# Patient Record
Sex: Male | Born: 1955 | Race: White | Hispanic: No | Marital: Single | Smoking: Current every day smoker
Health system: Southern US, Community
[De-identification: ages and names within clinical notes are randomized; demographics above are authoritative.]

## PROBLEM LIST (undated history)

## (undated) DIAGNOSIS — M1990 Unspecified osteoarthritis, unspecified site: Secondary | ICD-10-CM

## (undated) DIAGNOSIS — I2510 Atherosclerotic heart disease of native coronary artery without angina pectoris: Secondary | ICD-10-CM

## (undated) DIAGNOSIS — Z7901 Long term (current) use of anticoagulants: Secondary | ICD-10-CM

## (undated) DIAGNOSIS — I5189 Other ill-defined heart diseases: Secondary | ICD-10-CM

## (undated) DIAGNOSIS — J449 Chronic obstructive pulmonary disease, unspecified: Secondary | ICD-10-CM

## (undated) DIAGNOSIS — D696 Thrombocytopenia, unspecified: Secondary | ICD-10-CM

## (undated) DIAGNOSIS — I209 Angina pectoris, unspecified: Secondary | ICD-10-CM

## (undated) DIAGNOSIS — I10 Essential (primary) hypertension: Secondary | ICD-10-CM

## (undated) DIAGNOSIS — F419 Anxiety disorder, unspecified: Secondary | ICD-10-CM

## (undated) DIAGNOSIS — I700 Atherosclerosis of aorta: Secondary | ICD-10-CM

## (undated) DIAGNOSIS — T8859XA Other complications of anesthesia, initial encounter: Secondary | ICD-10-CM

## (undated) DIAGNOSIS — R911 Solitary pulmonary nodule: Secondary | ICD-10-CM

## (undated) DIAGNOSIS — I6529 Occlusion and stenosis of unspecified carotid artery: Secondary | ICD-10-CM

## (undated) DIAGNOSIS — E785 Hyperlipidemia, unspecified: Secondary | ICD-10-CM

## (undated) DIAGNOSIS — K219 Gastro-esophageal reflux disease without esophagitis: Secondary | ICD-10-CM

## (undated) DIAGNOSIS — R0600 Dyspnea, unspecified: Secondary | ICD-10-CM

## (undated) DIAGNOSIS — I4891 Unspecified atrial fibrillation: Secondary | ICD-10-CM

## (undated) DIAGNOSIS — E079 Disorder of thyroid, unspecified: Secondary | ICD-10-CM

## (undated) DIAGNOSIS — E039 Hypothyroidism, unspecified: Secondary | ICD-10-CM

## (undated) HISTORY — PX: CARDIAC CATHETERIZATION: SHX172

## (undated) HISTORY — PX: CATARACT EXTRACTION: SUR2

## (undated) HISTORY — DX: Disorder of thyroid, unspecified: E07.9

## (undated) HISTORY — DX: Essential (primary) hypertension: I10

## (undated) HISTORY — PX: UPPER GI ENDOSCOPY: SHX6162

## (undated) HISTORY — PX: LEG SURGERY: SHX1003

## (undated) HISTORY — PX: WRIST SURGERY: SHX841

## (undated) HISTORY — PX: FRACTURE SURGERY: SHX138

## (undated) HISTORY — PX: COLONOSCOPY: SHX174

## (undated) HISTORY — PX: HERNIA REPAIR: SHX51

---

## 2008-07-09 ENCOUNTER — Inpatient Hospital Stay: Payer: Self-pay | Admitting: Orthopaedic Surgery

## 2008-07-09 ENCOUNTER — Other Ambulatory Visit: Payer: Self-pay

## 2008-07-09 IMAGING — CR DG FEMUR 2V*R*
1 series · 3 of 3 positions shown · non-contrast
Comparison: none

REASON FOR EXAM: fall, pain
COMMENTS:

PROCEDURE:     DXR - DXR FEMUR RIGHT  - [DATE]  [DATE]
RESULT:     There does not appear to be evidence of fracture, dislocation or
malalignment.

[Series 1: view not recorded · 0.17mm/px · 3 of 3 slices shown]
[im 1/3]
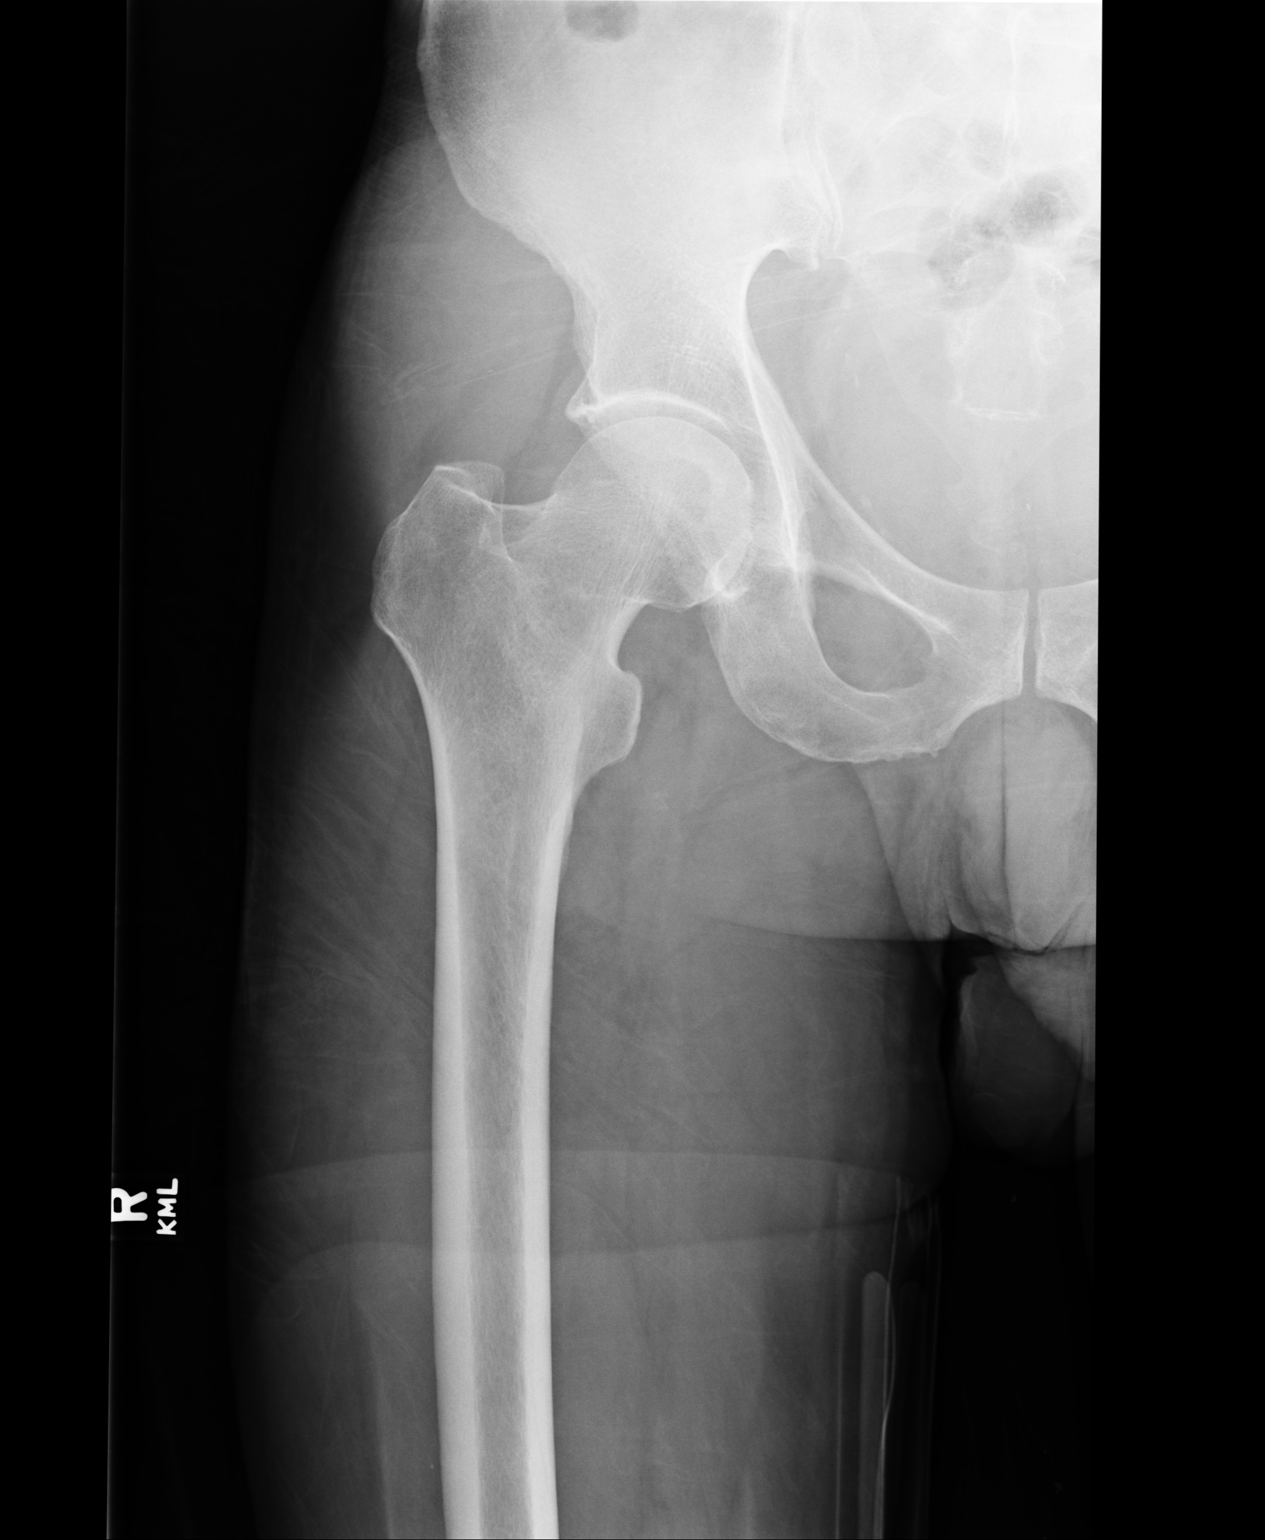
[im 2/3]
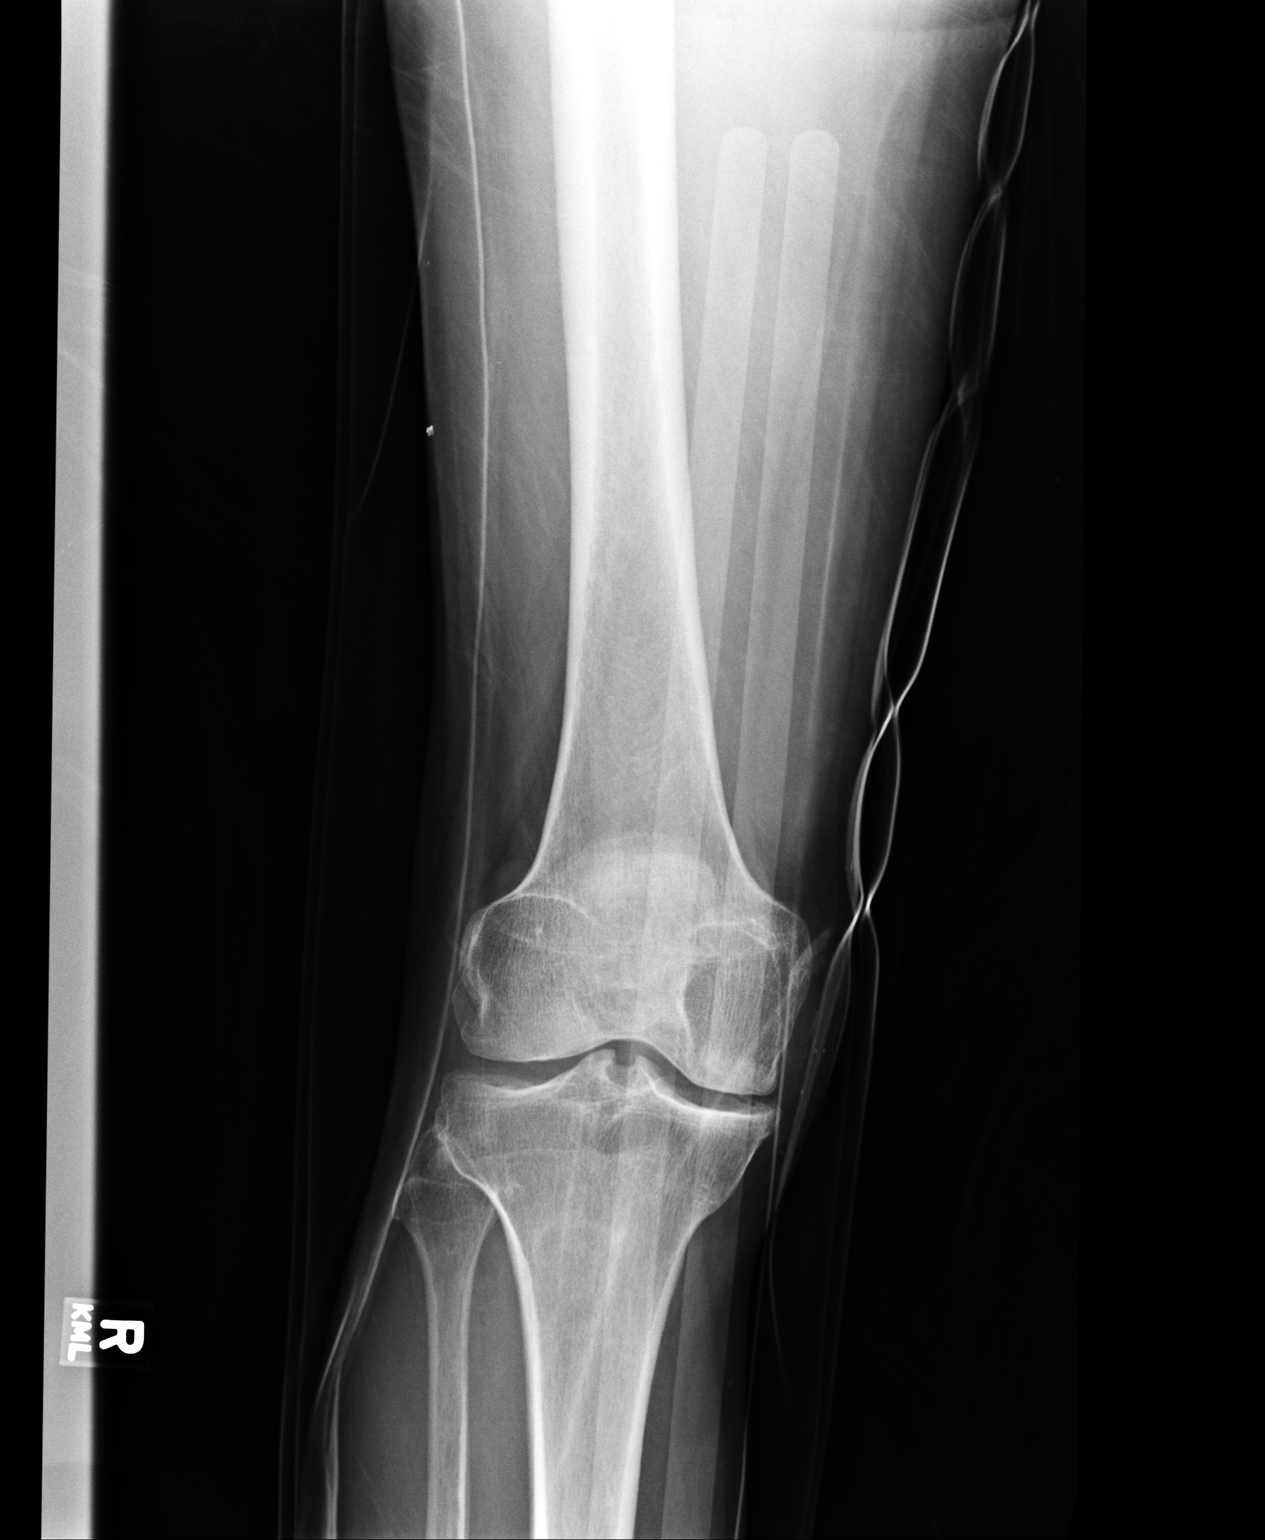
[im 3/3]
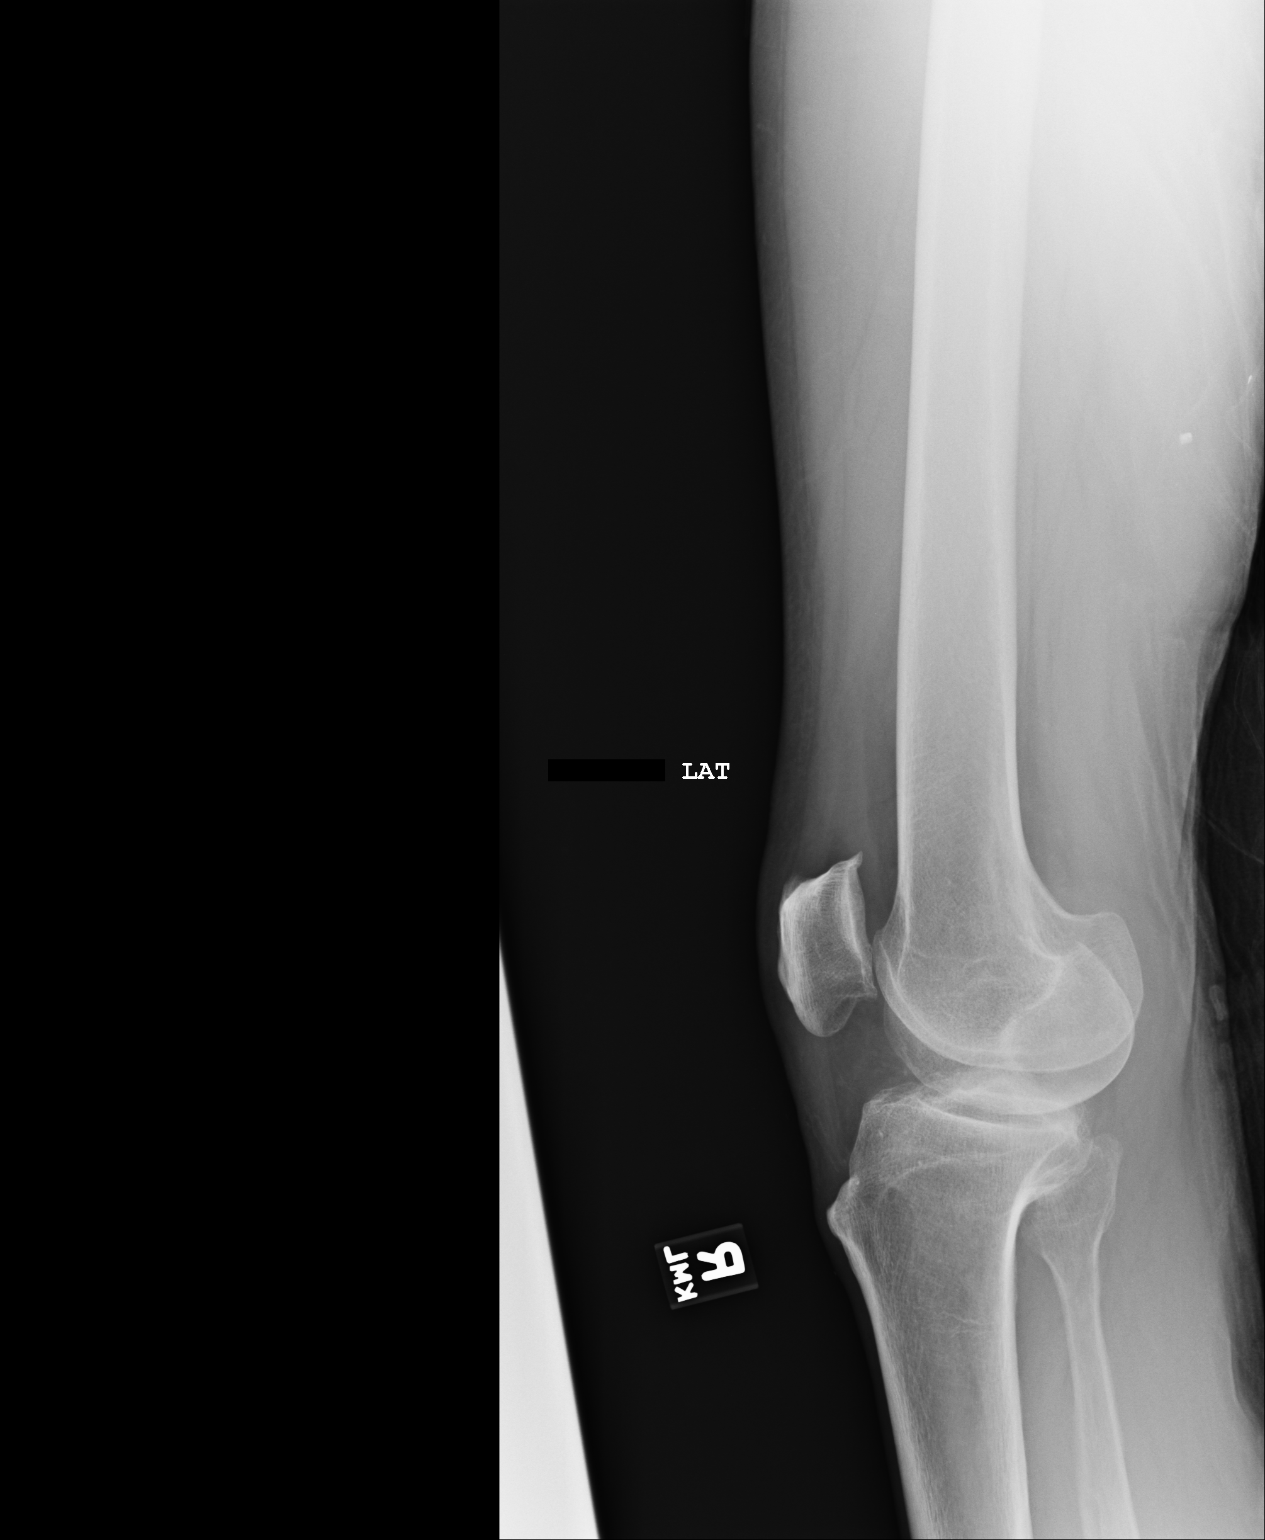

[3 of 3 positions shown; findings below may reference images not displayed]

IMPRESSION: Unremarkable RIGHT femur.

If there is persistent complaints of pain or persistent clinical concern, a
repeat evaluation in 7-10 days is recommended if clinically warranted.

## 2008-07-09 IMAGING — CR DG CHEST 1V
1 series · 2 of 2 positions shown · non-contrast
Comparison: none

REASON FOR EXAM: fall, distracting injury
COMMENTS:

[Series 1: view not recorded · 0.17mm/px · 2 of 2 slices shown]
[im 1/2]
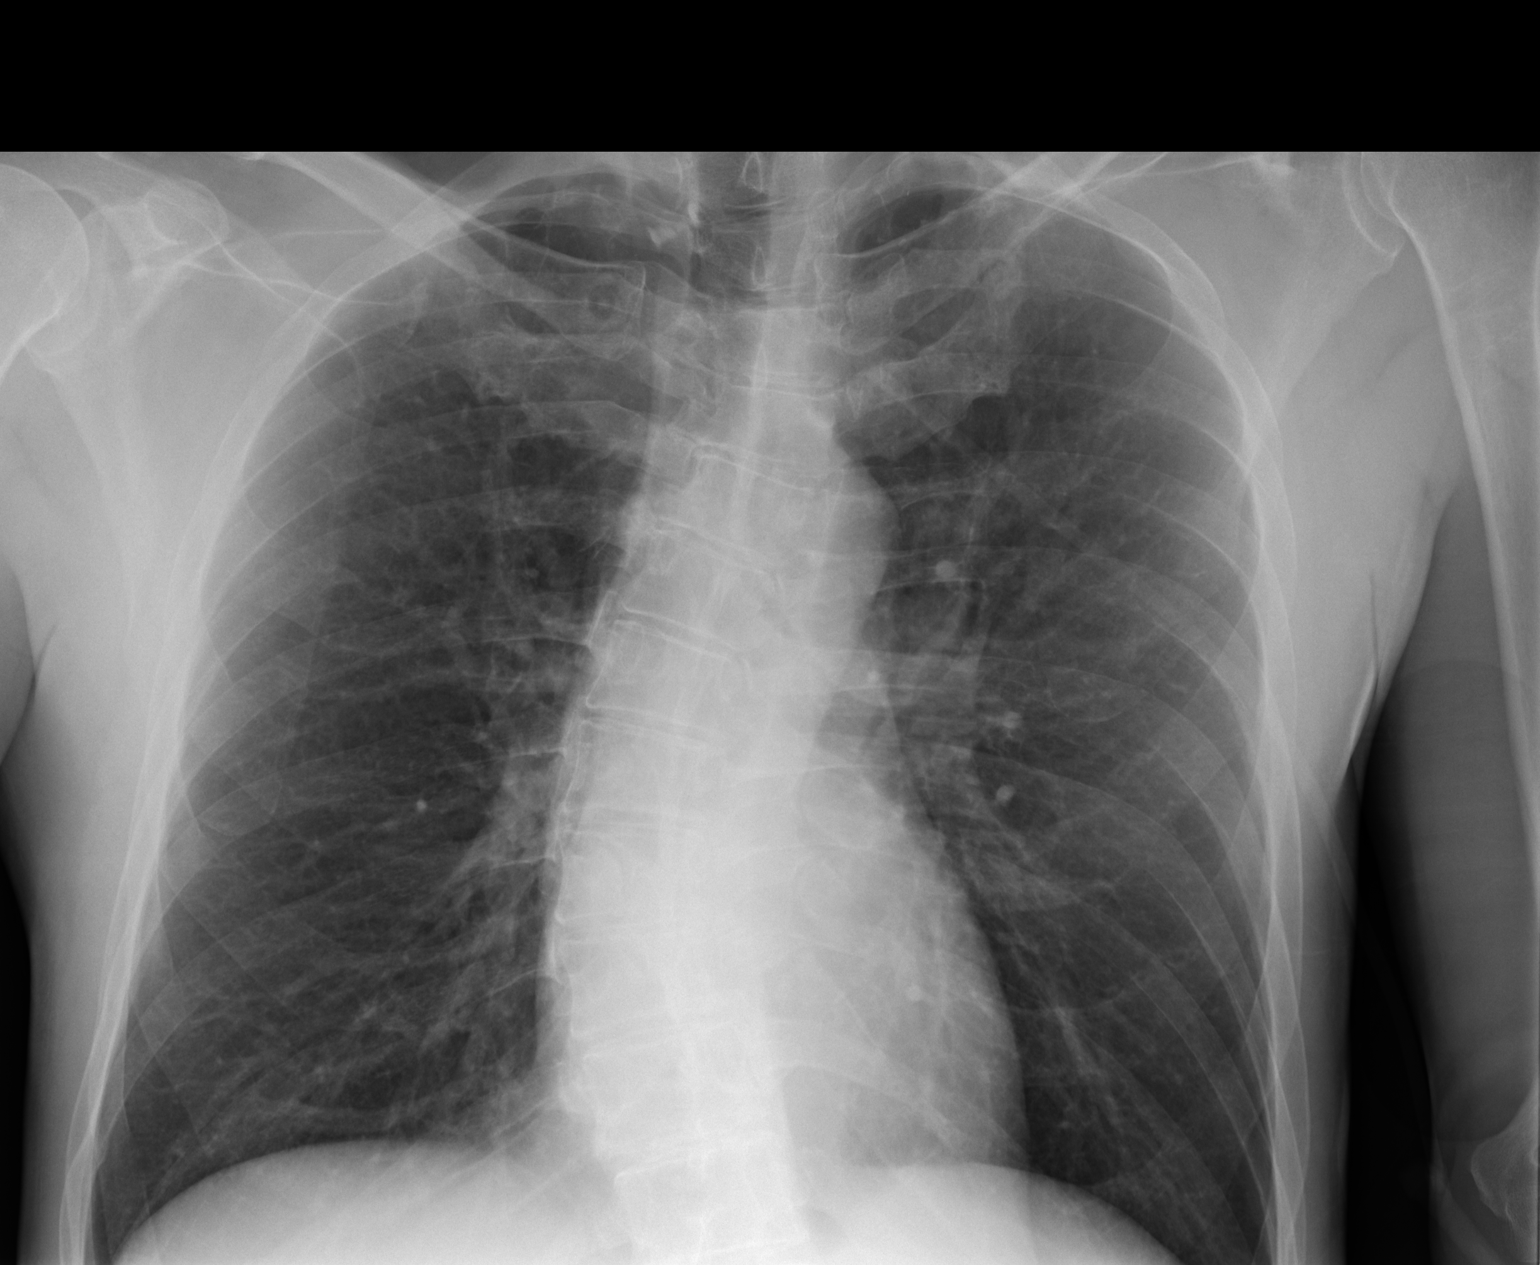
[im 2/2]
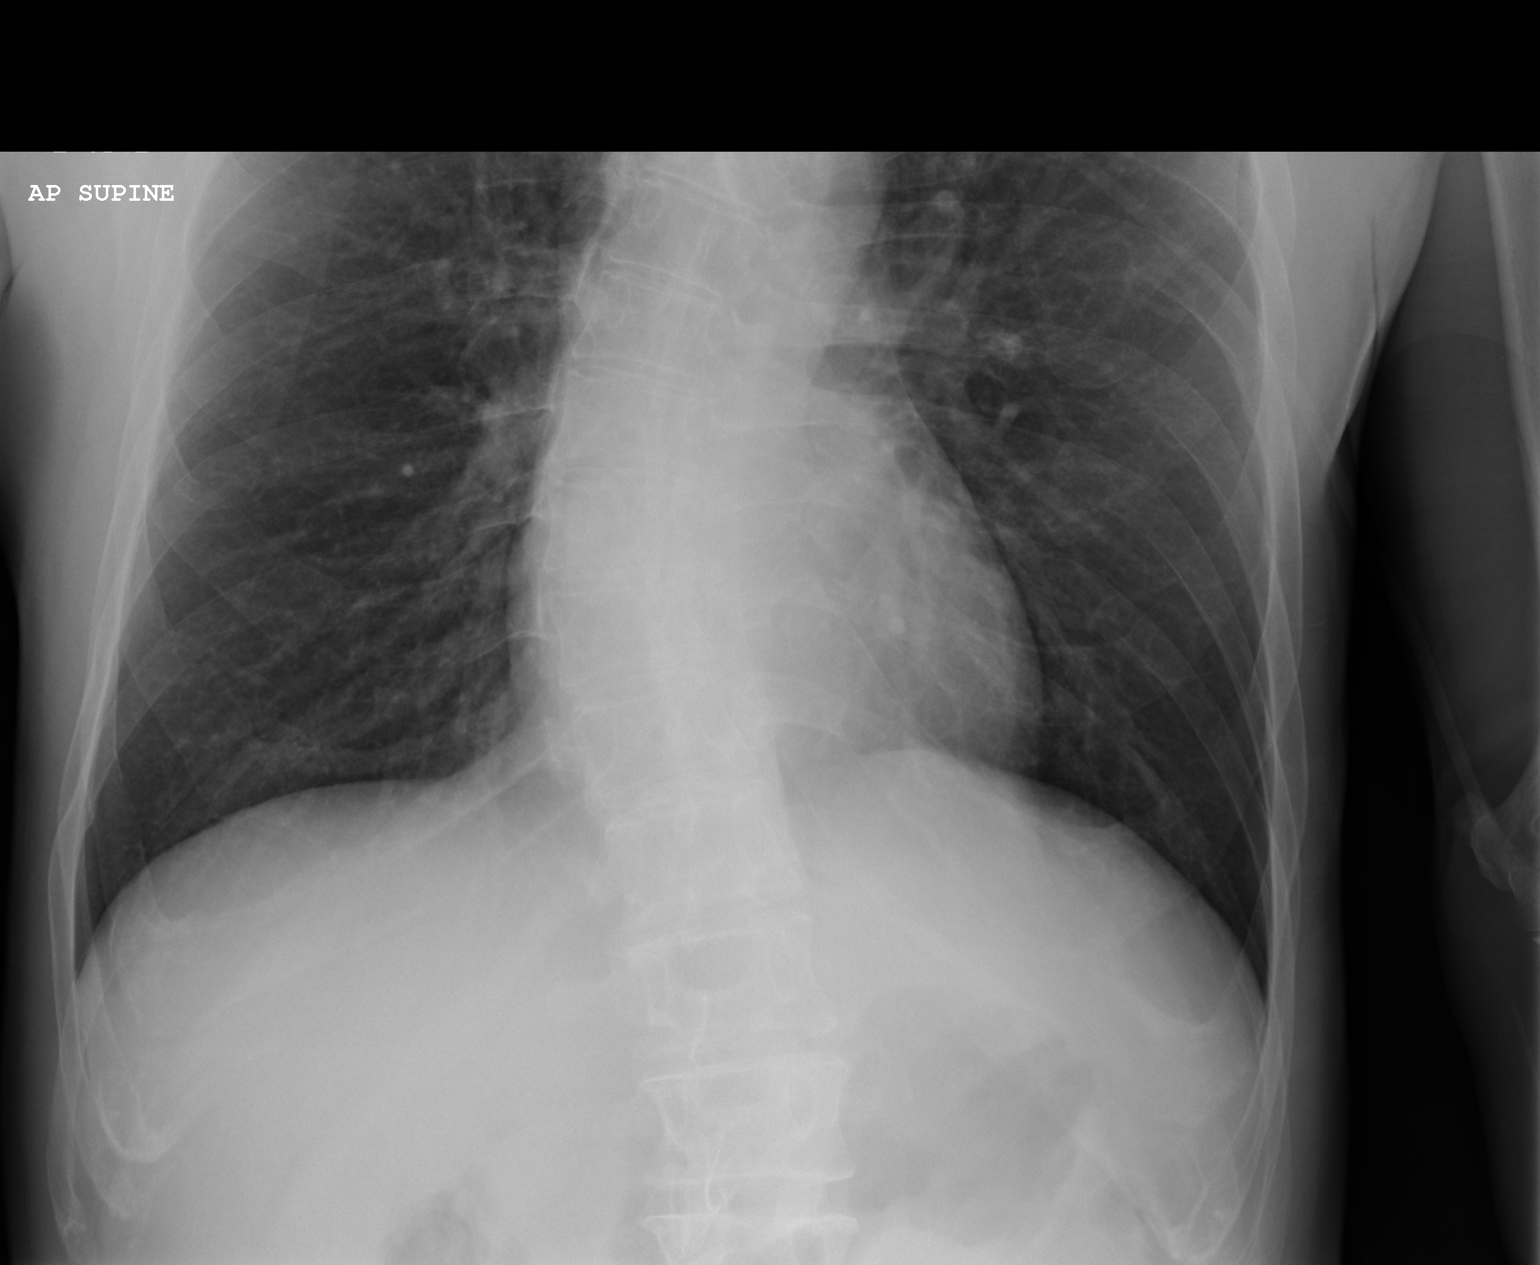

[2 of 2 positions shown; findings below may reference images not displayed]

PROCEDURE:     DXR - DXR CHEST 1 VIEWAP OR PA  - [DATE]  [DATE]

RESULT:     There is no evidence of focal regions of consolidation or focal
infiltrates.  There is mild thickening of the  interstitial markings.  The
cardiac silhouette is within normal limits.  The visualized bony skeleton
demonstrates dextroscoliosis of the thoracolumbar spine.
IMPRESSION: 1.     No evidence of focal regions of consolidation.  An interstitial
infiltrate such as bronchitis cannot be excluded if of clinical concern.
2.     Scoliosis of the thoracolumbar spine.

## 2008-07-09 IMAGING — CR RIGHT TIBIA AND FIBULA - 2 VIEW
1 series · 2 of 2 positions shown · non-contrast
Comparison: none

REASON FOR EXAM: fall, pain, deformity
COMMENTS:

[Series 1: view not recorded · 0.17mm/px · 2 of 2 slices shown]
[im 1/2]
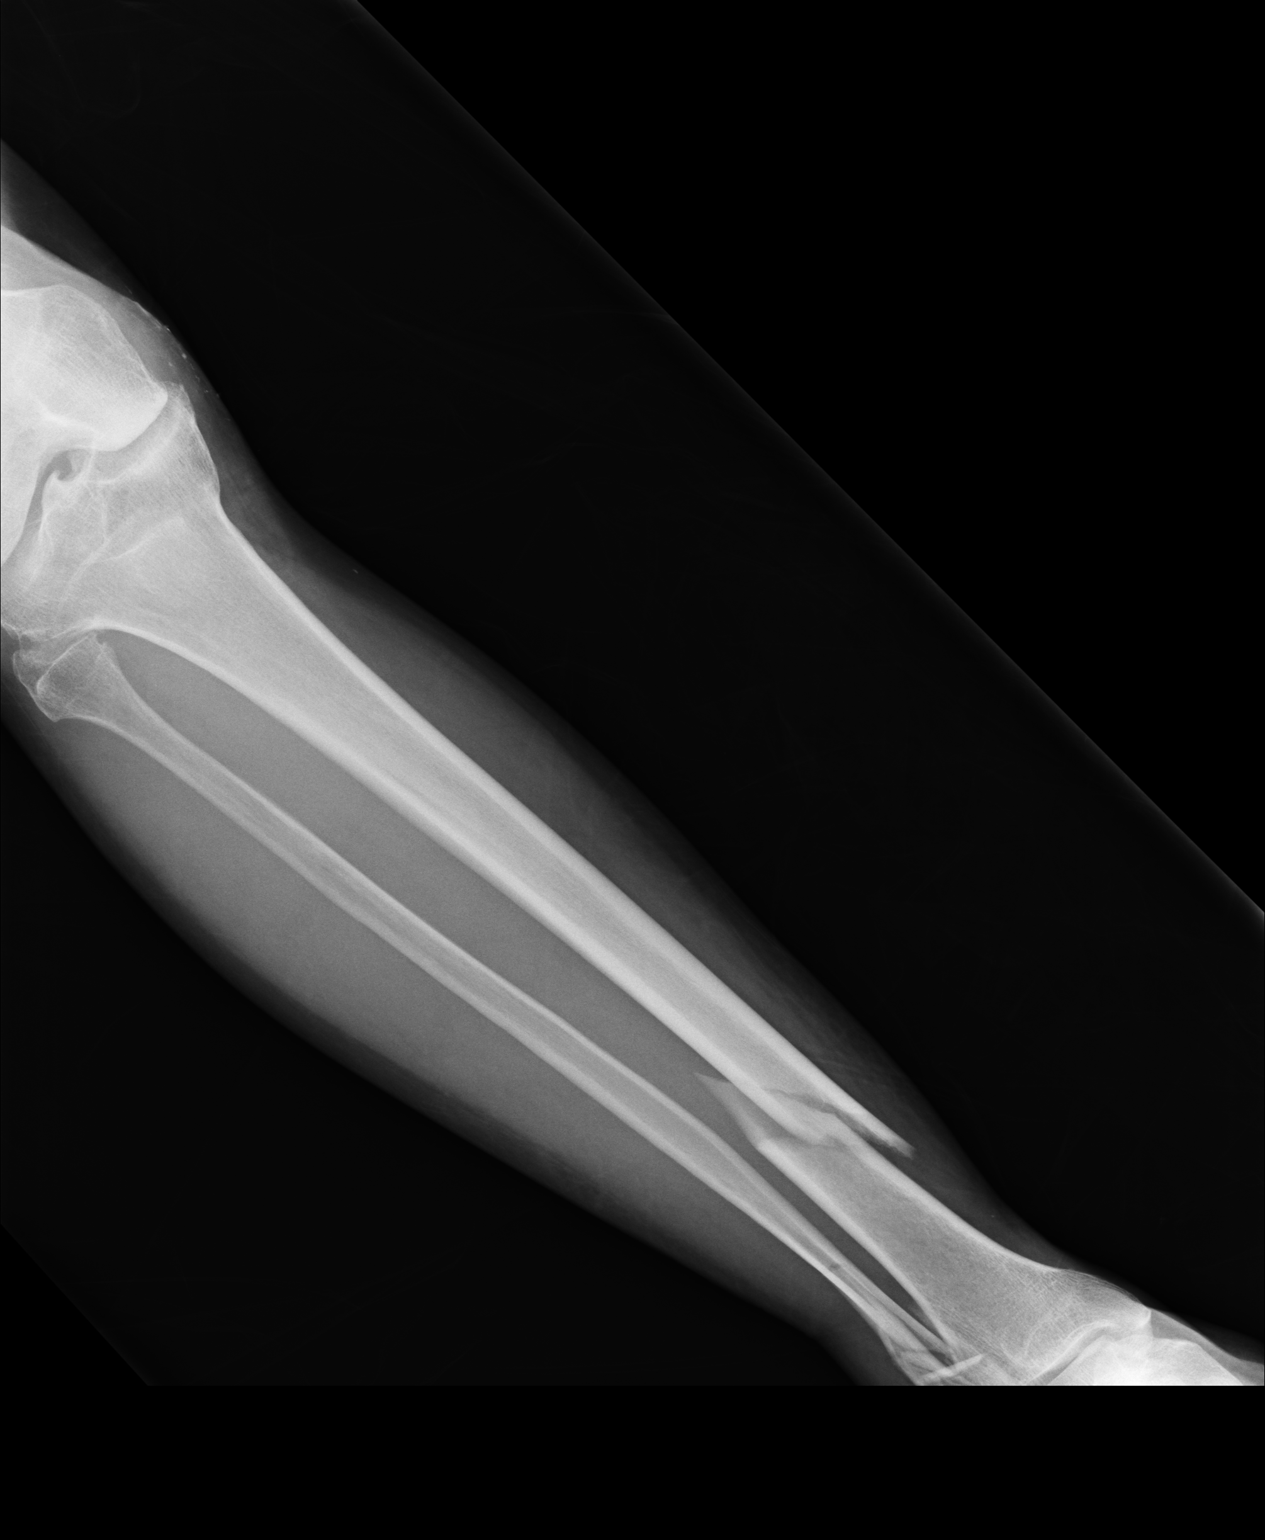
[im 2/2]
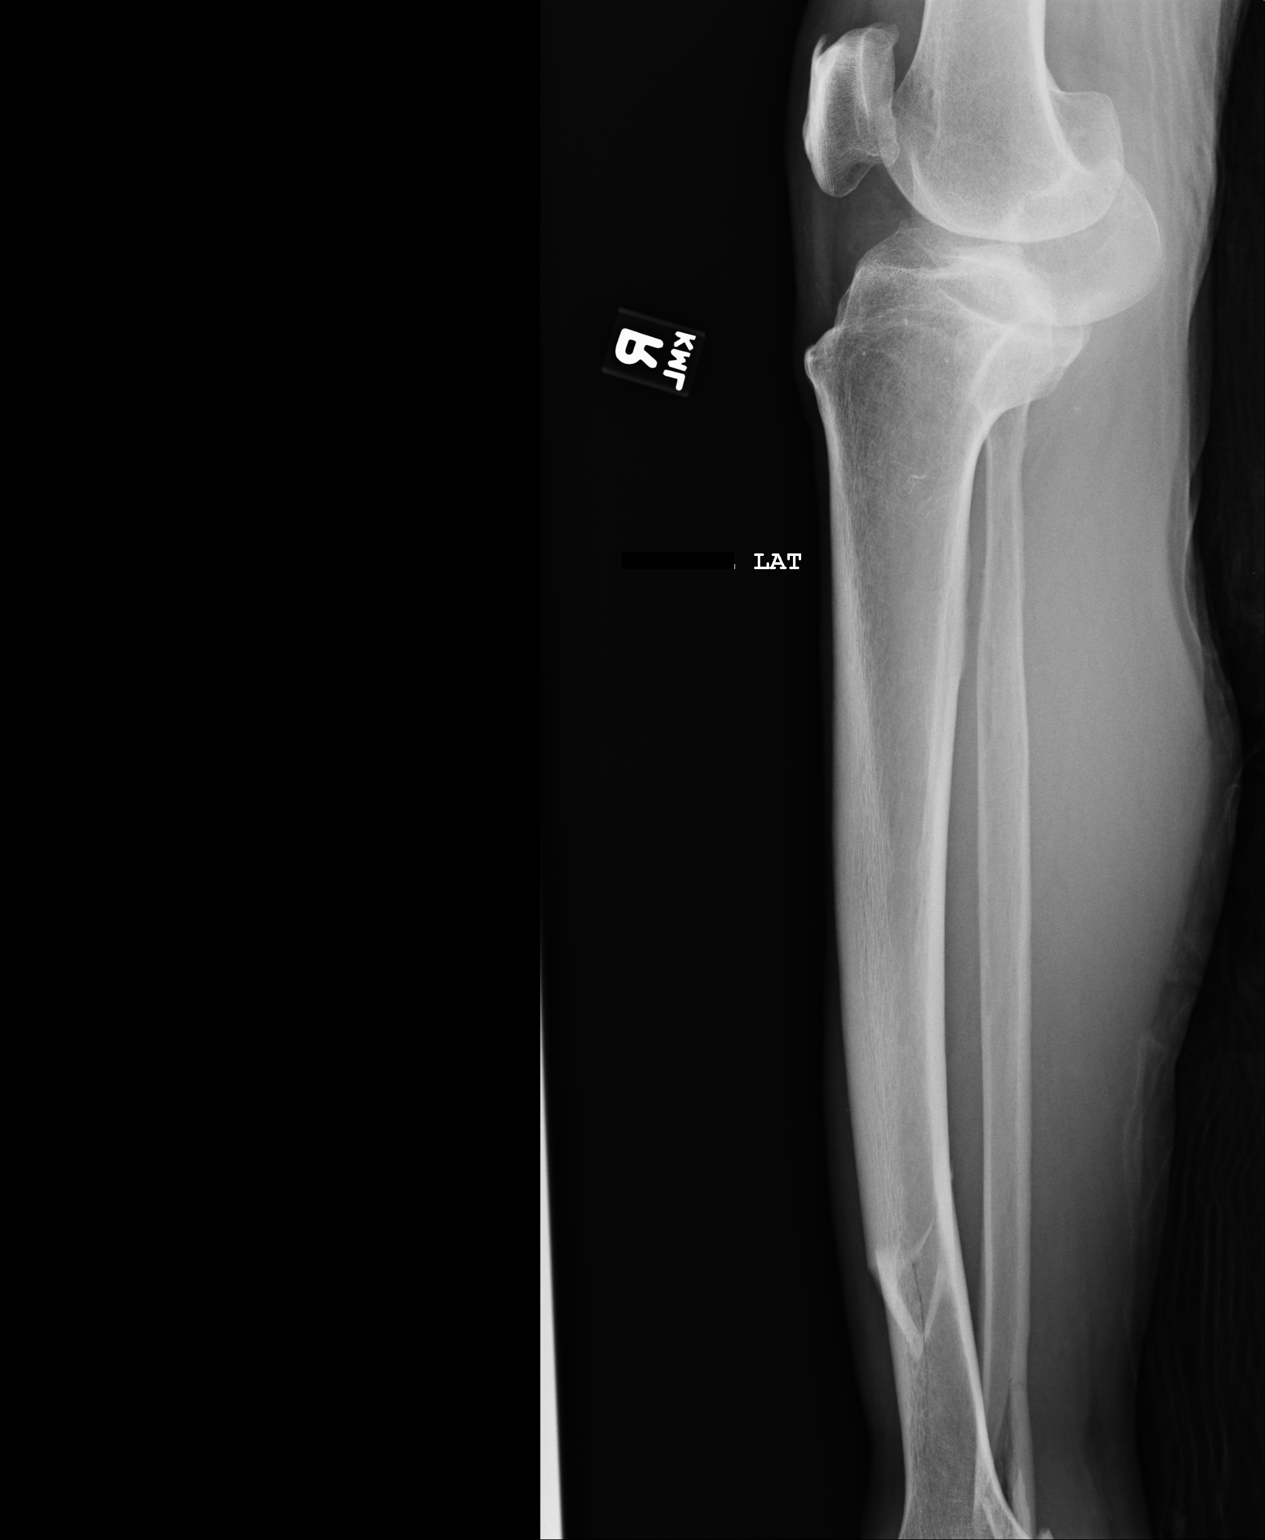

[2 of 2 positions shown; findings below may reference images not displayed]

RESULT:     A spiral comminuted distal tibia shaft fracture is appreciated.
There is approximately 1.0 cm of override. The distal fracture fragment
demonstrates lateral displacement.  A distal fibular fracture is appreciated
which is comminuted.  The distal fracture fragment demonstrates medial
displacement.   There appears to be approximately 1.0 cm of override.
IMPRESSION: Distal fibular fracture and tibial fracture as described above.

## 2008-07-09 IMAGING — CR PELVIS - 1-2 VIEW
1 series · 1 of 1 positions shown · non-contrast
Comparison: none

REASON FOR EXAM: fall, pain
COMMENTS:

PROCEDURE:     DXR - DXR PELVIS AP ONLY  - [DATE]  [DATE]
RESULT:
There is no evidence of fracture, dislocation or malalignment.

[view not recorded]
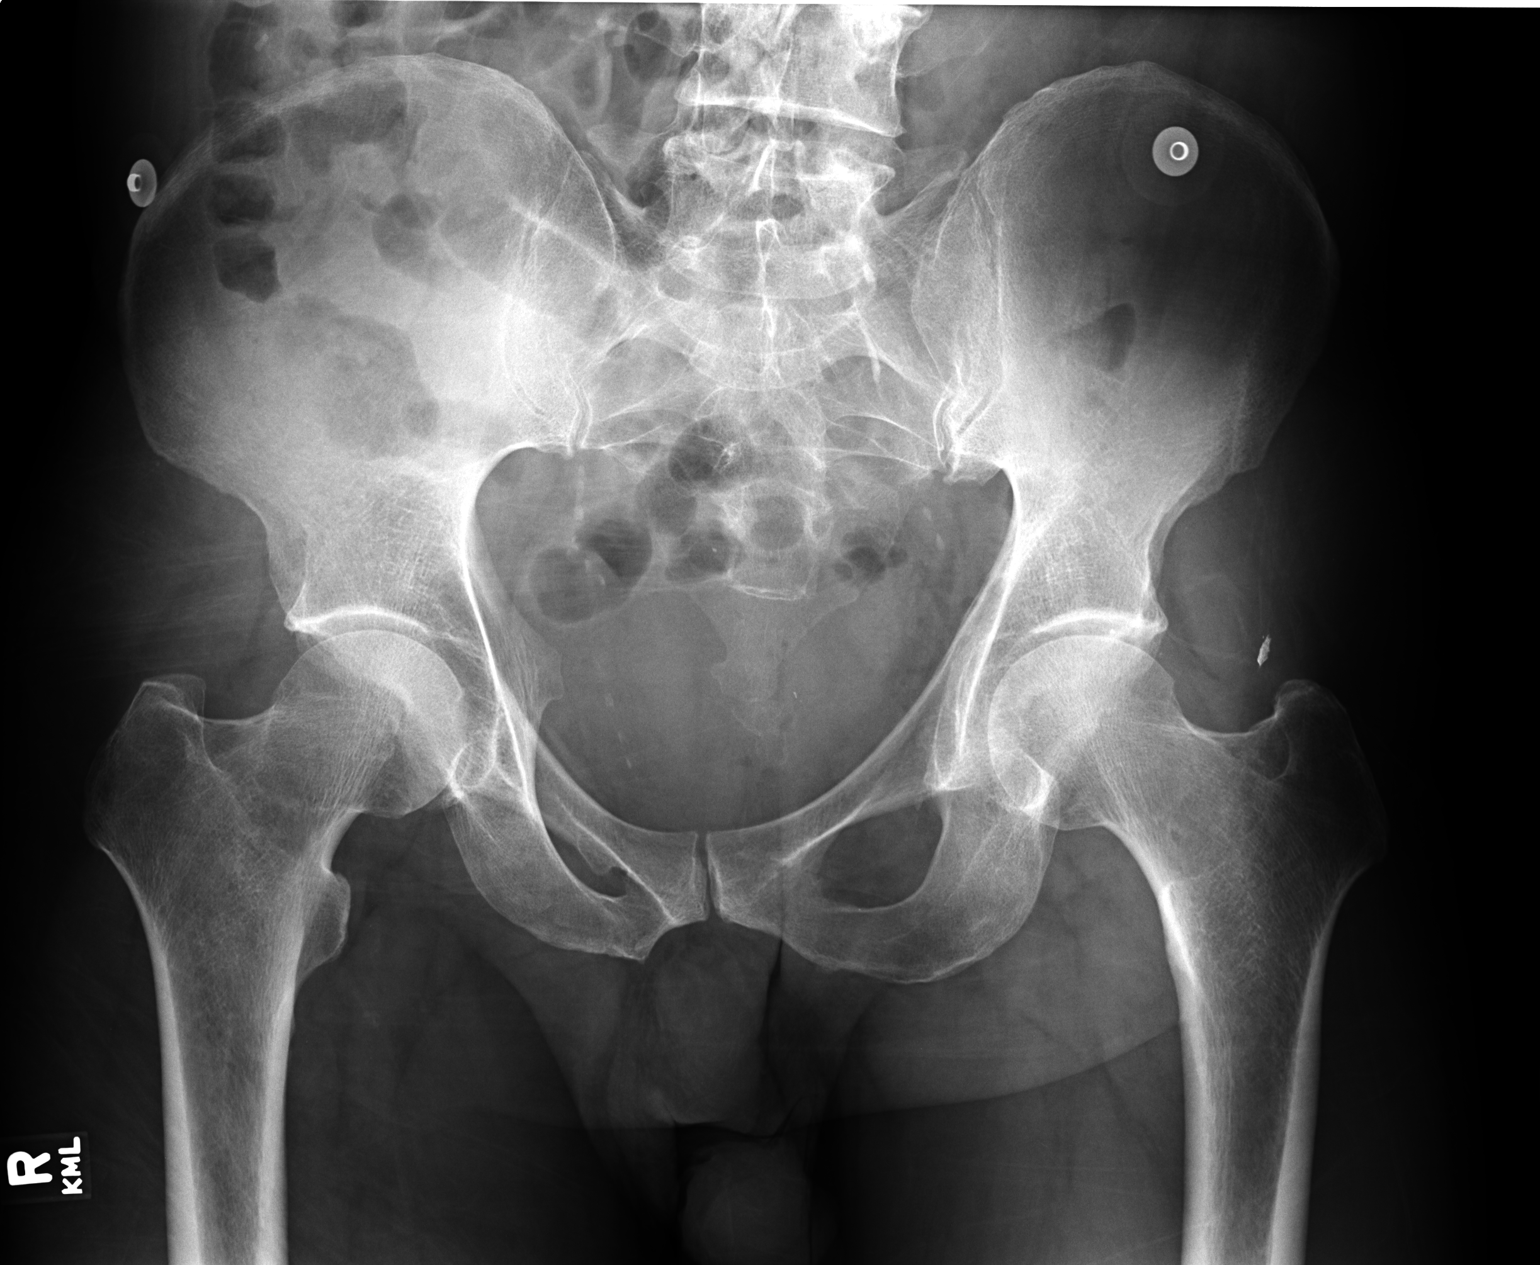

[1 of 1 positions shown; findings below may reference images not displayed]

IMPRESSION: No evidence of acute abnormalities as described above.

If there is specific clinical concern or specific complaints of pain,
further evaluation with pelvis fracture protocol MRI is recommended.

## 2008-07-09 IMAGING — CT CT HEAD WITHOUT CONTRAST
2 series · 16 of 30 positions shown, 20 images · non-contrast
Comparison: none

REASON FOR EXAM: fall, from 10 feet, loc
COMMENTS:

[Series 2: without · axial · non-contrast · 0.37mm/px · z∈[+340,+470]mm · 13 of 32 slices shown, 17 images]
[im 3/32  brain]
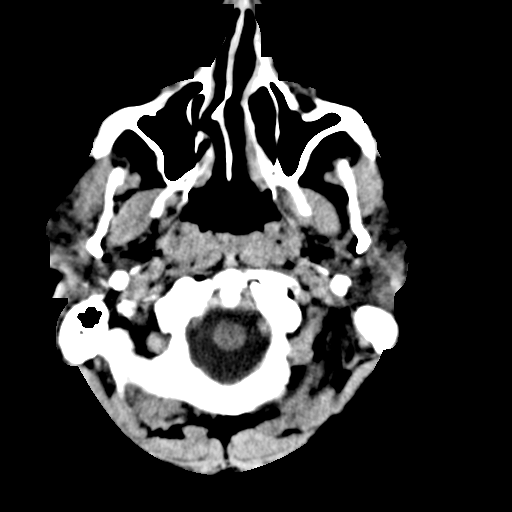
[im 3/32  bone]
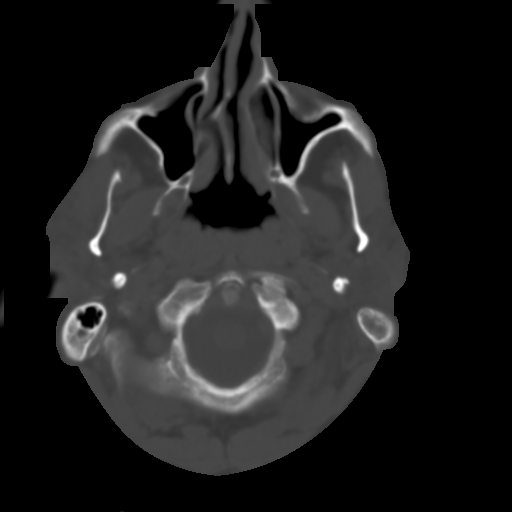
[im 5/32  brain]
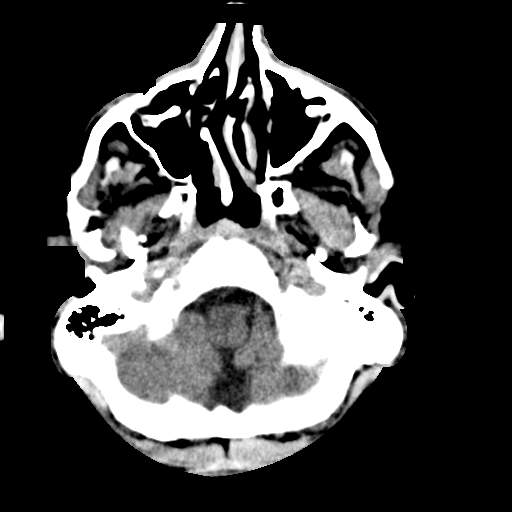
[im 7/32  brain]
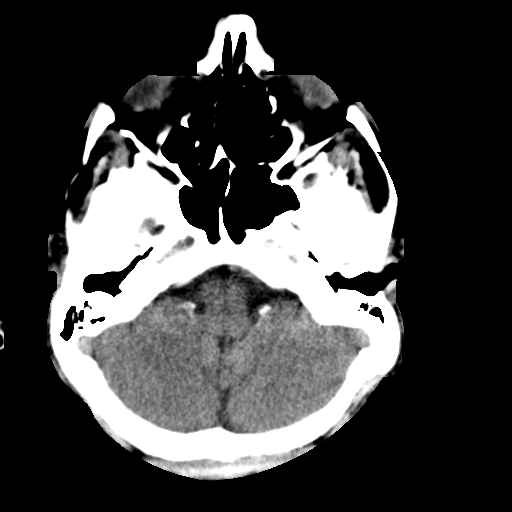
[im 9/32  brain]
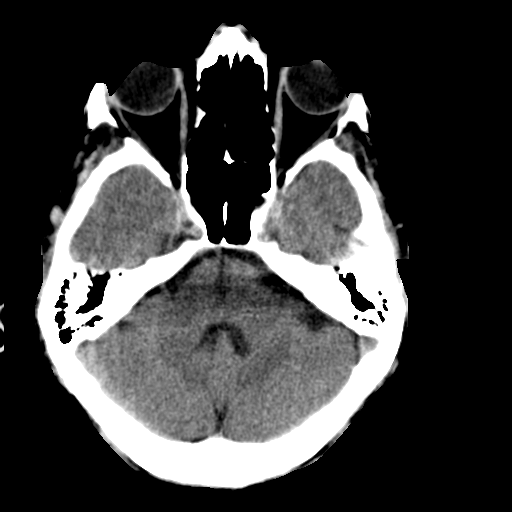
[im 12/32  brain]
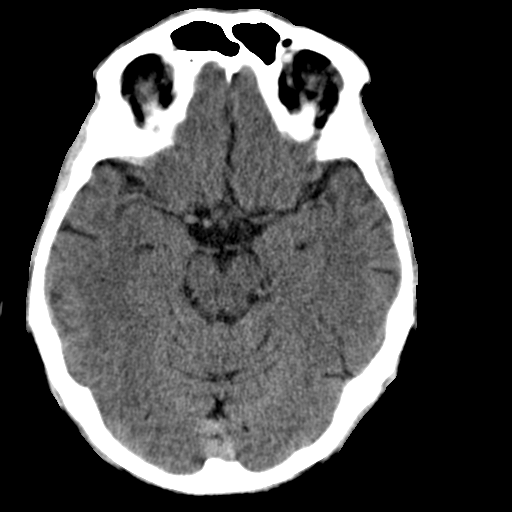
[im 12/32  bone]
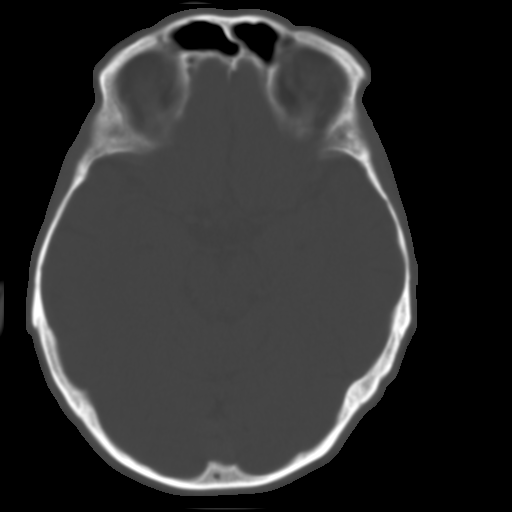
[im 14/32  brain]
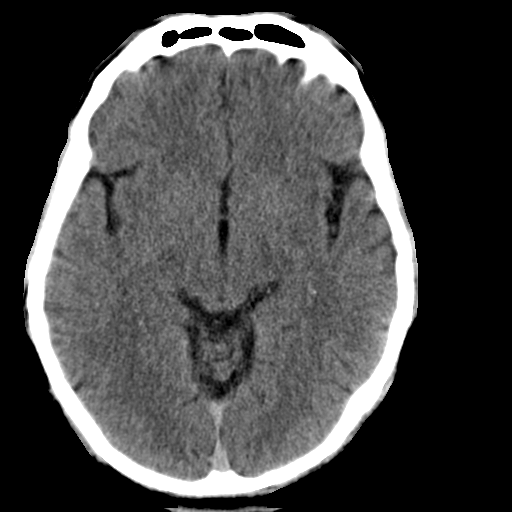
[im 16/32  brain]
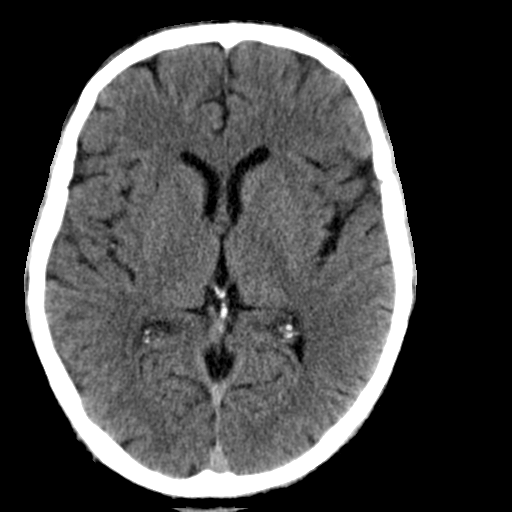
[im 18/32  brain]
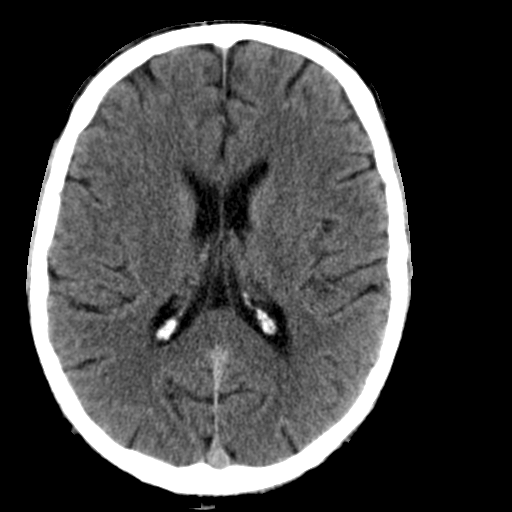
[im 20/32  brain]
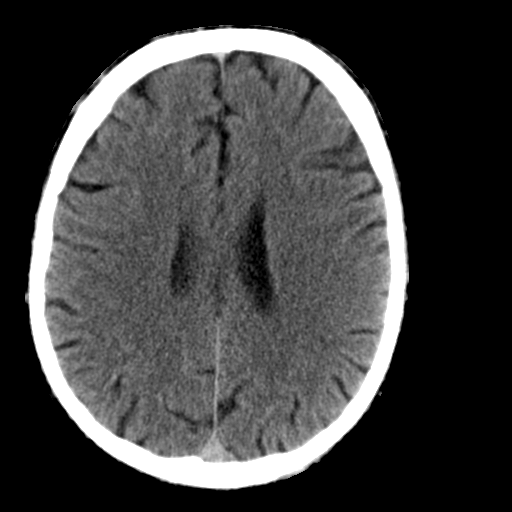
[im 20/32  bone]
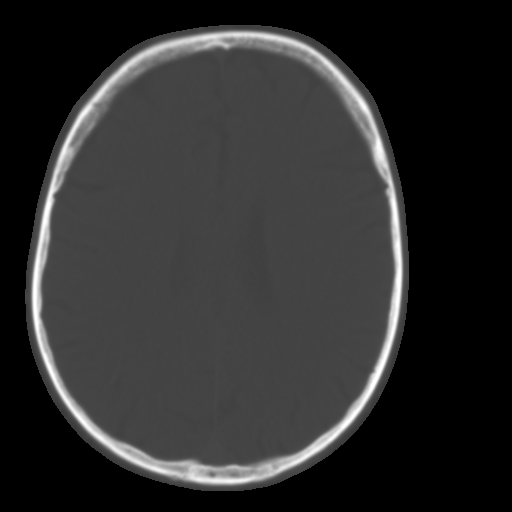
[im 23/32  brain]
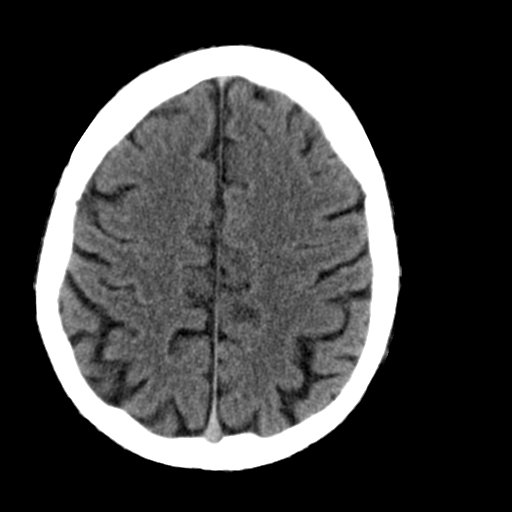
[im 25/32  brain]
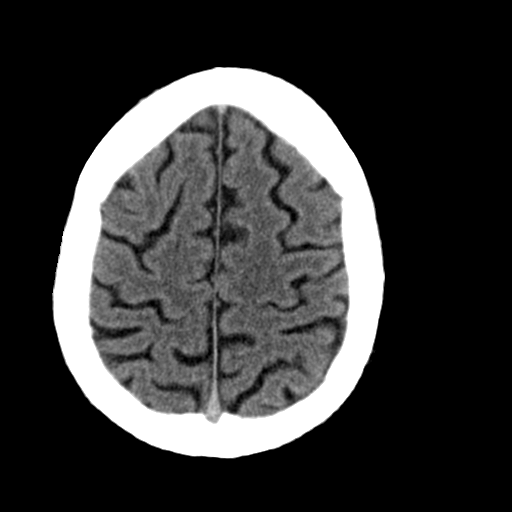
[im 27/32  brain]
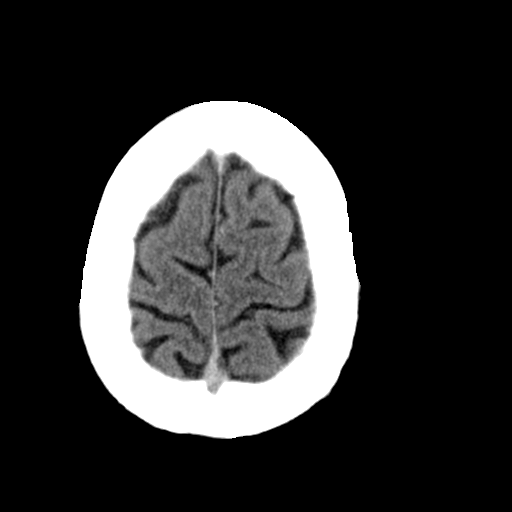
[im 29/32  brain]
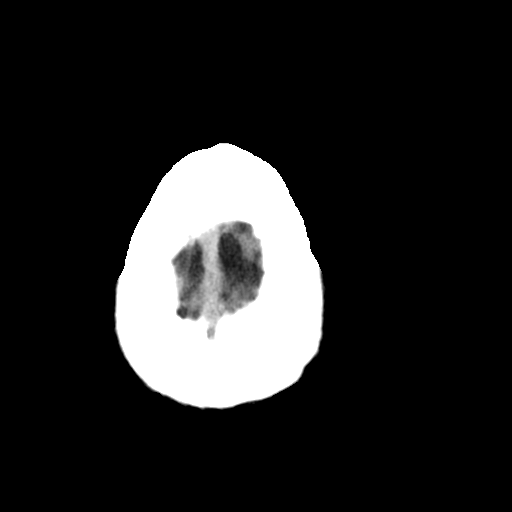
[im 29/32  bone]
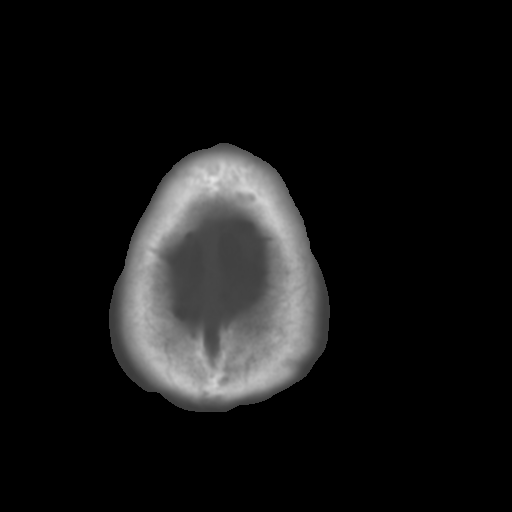

[Series 3: bone · axial · 0.37mm/px · z∈[+340,+384]mm · 3 of 32 slices shown]
[im 3/32  bone]
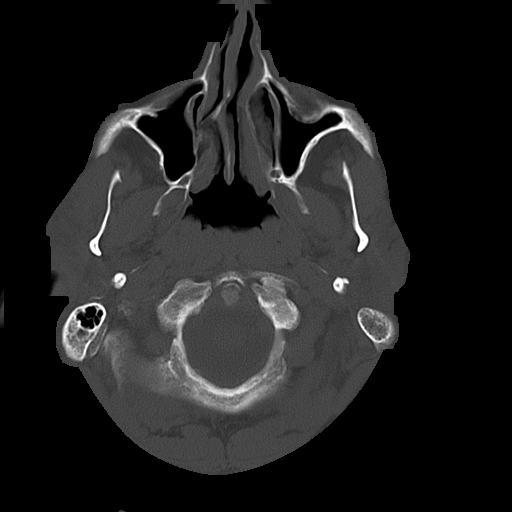
[im 7/32  bone]
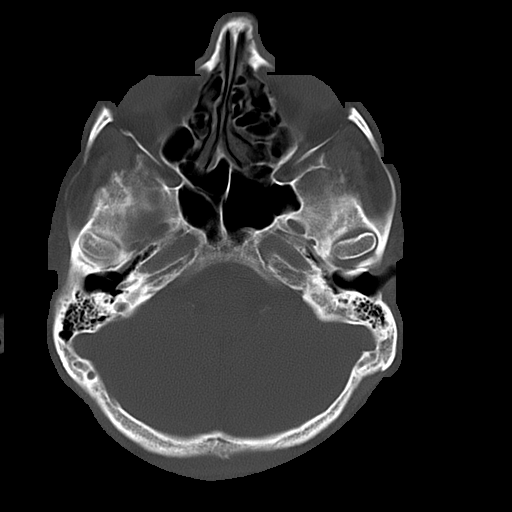
[im 12/32  bone]
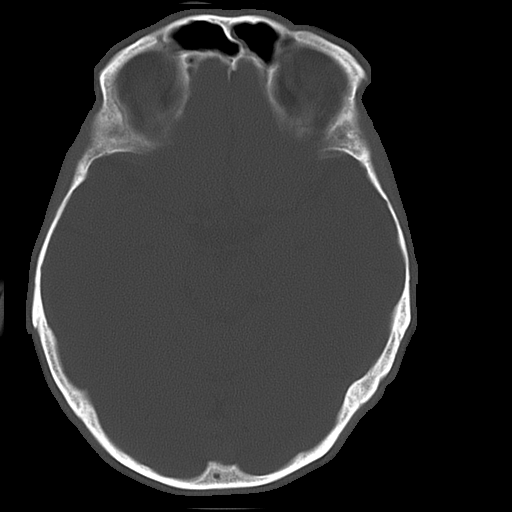

[16 of 30 positions shown; findings below may reference images not displayed]

PROCEDURE:     CT  - CT HEAD WITHOUT CONTRAST  - [DATE]  [DATE]

RESULT:     There is no evidence of intraaxial nor extraaxial fluid
collections nor evidence of acute hemorrhage.  No secondary signs are
appreciated to suggest mass effect, subacute or chronic infarction.
Evaluation of the visualized bony skeleton demonstrates findings consistent
with a chronic zygomatic arch fracture on the RIGHT as well as a chronic
nasal bone fracture on the LEFT distally.  There does not appear to be
evidence of acute osseous abnormalities.
IMPRESSION: No evidence of focal or acute intracranial abnormalities.

Dr. COFFI, of the emergency department, was informed of these findings via
preliminary fax report on [DATE] at [DATE] p.m. Central Standard Time.

## 2008-07-09 IMAGING — CR RIGHT ANKLE - 2 VIEW
1 series · 2 of 2 positions shown · non-contrast
Comparison: none

REASON FOR EXAM: fall, pain, deformity
COMMENTS:

[Series 1: view not recorded · 0.17mm/px · 2 of 2 slices shown]
[im 1/2]
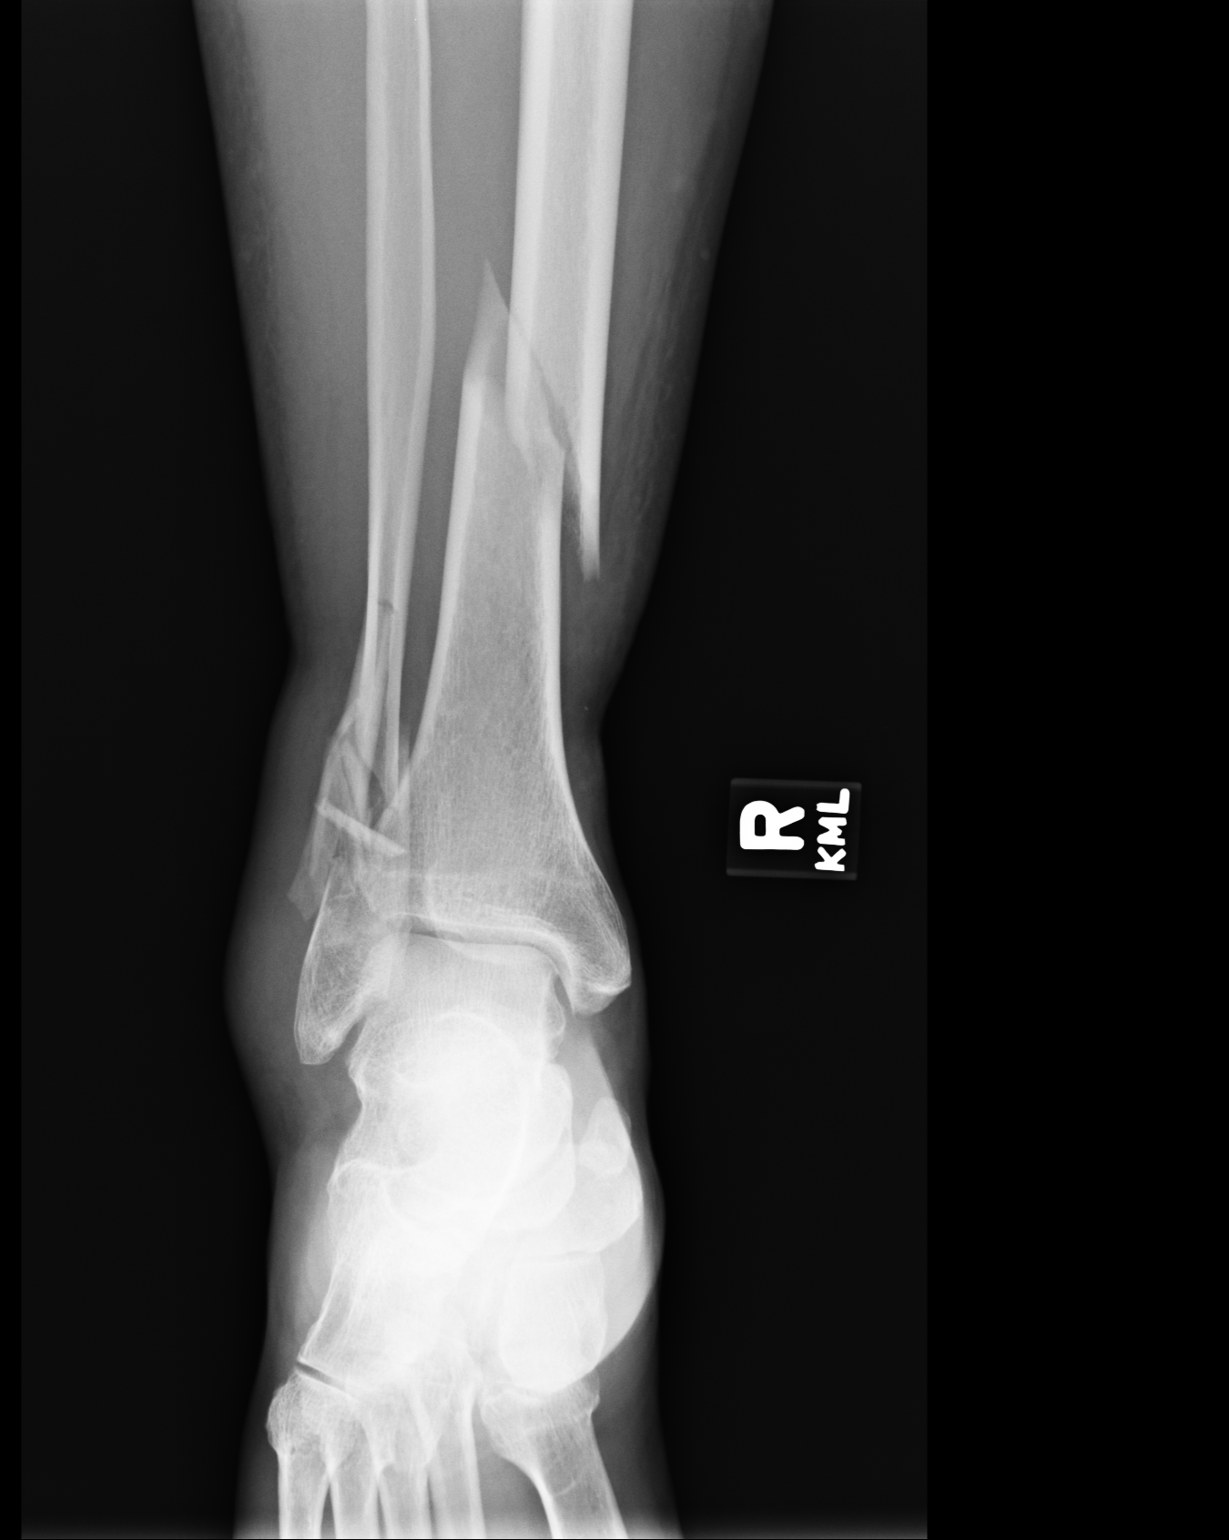
[im 2/2]
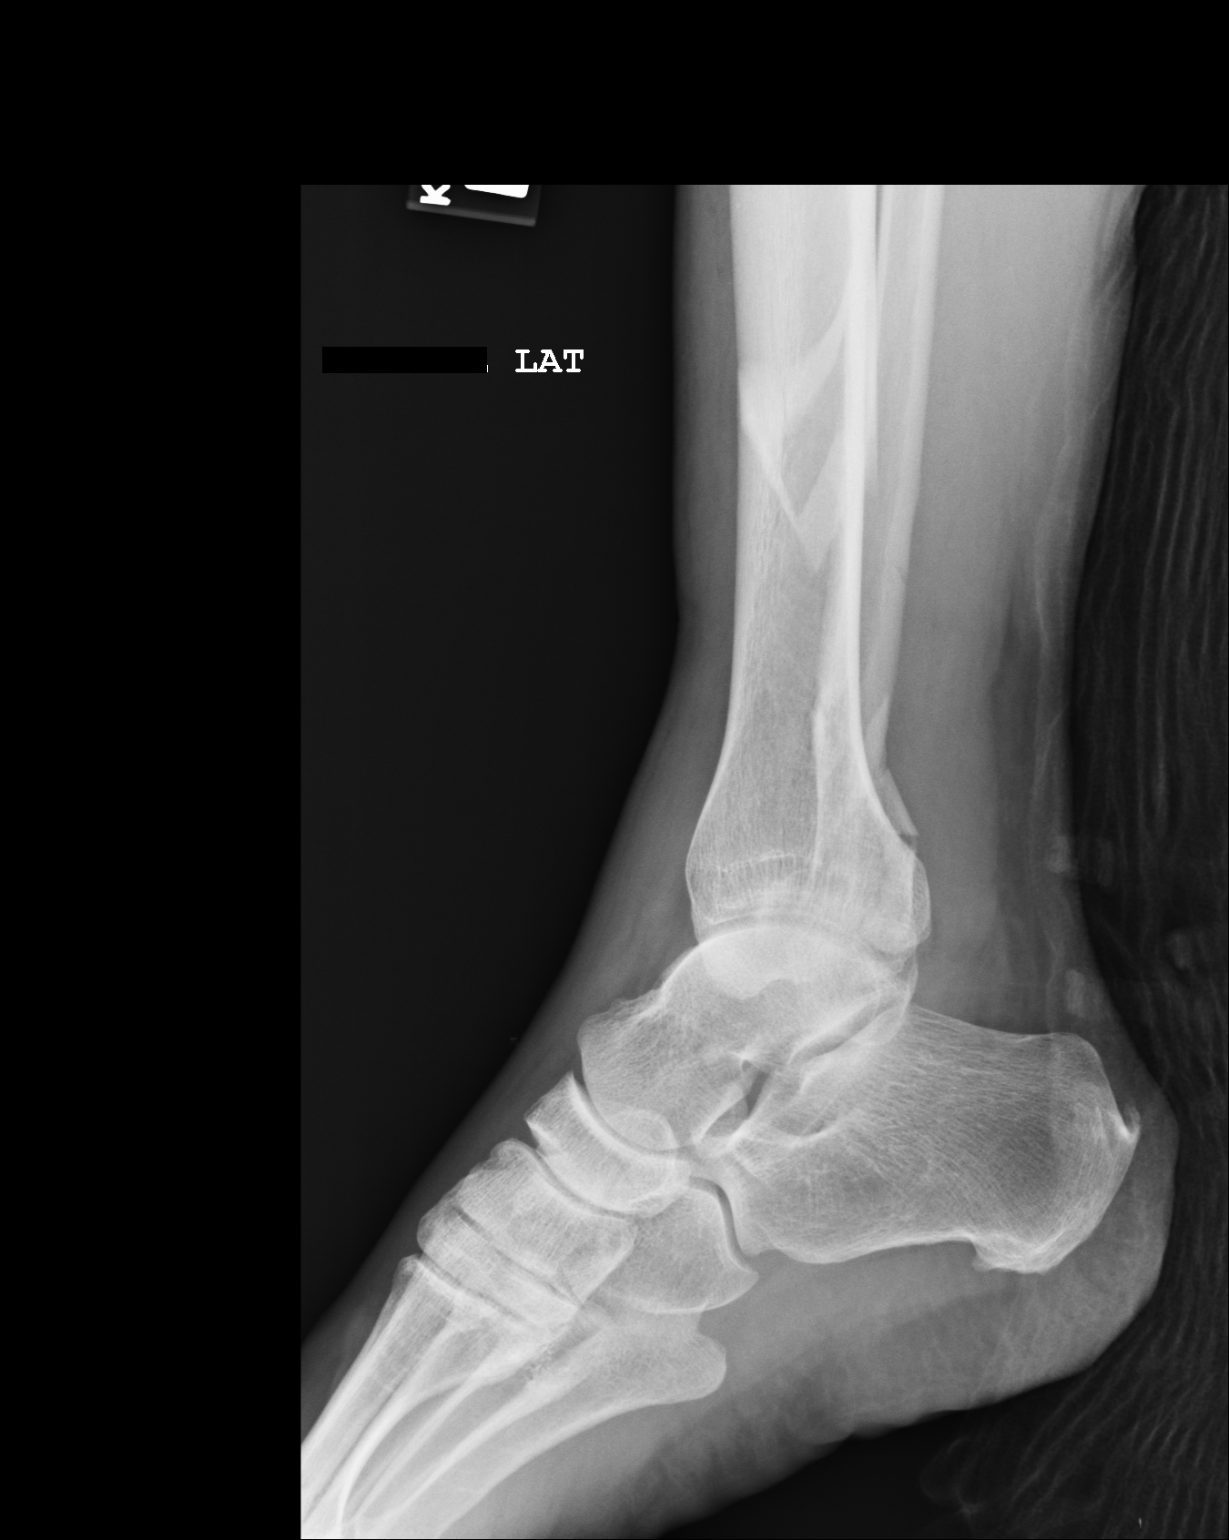

[2 of 2 positions shown; findings below may reference images not displayed]

PROCEDURE:     DXR - DXR ANKLE RIGHT AP AND LATERAL  - [DATE]  [DATE]

RESULT:     Distal tibia and fibular fractures are appreciated as described
on the previous tibia and fibula examination.  There does appear to be
intraarticular extension of the distal fibular fracture.  The ankle mortise
appears to be maintained.  No further fractures or dislocations are
appreciated.
IMPRESSION: Distal tibia and fibular fractures as described above.

## 2008-07-09 IMAGING — CT CT CERVICAL SPINE WITHOUT CONTRAST
1 series · 12 of 14 positions shown, 15 images · non-contrast
Comparison: none

REASON FOR EXAM: fall, loc, distracting injury to rt le
COMMENTS:

[Series 5: axial · axial · 0.29mm/px · z∈[+181,+319]mm · 12 of 87 slices shown, 15 images]
[im 7/87  soft-tissue]
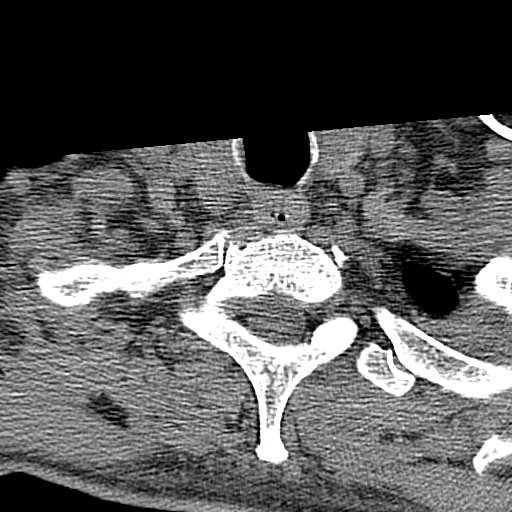
[im 7/87  bone]
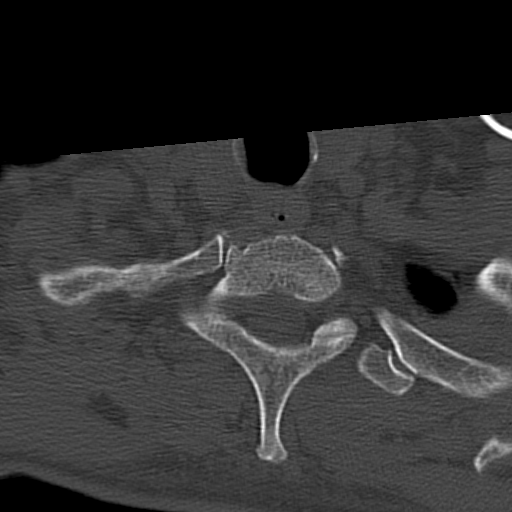
[im 14/87  bone]
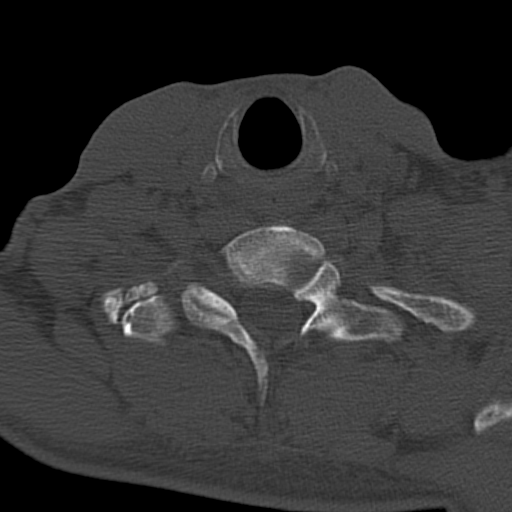
[im 20/87  bone]
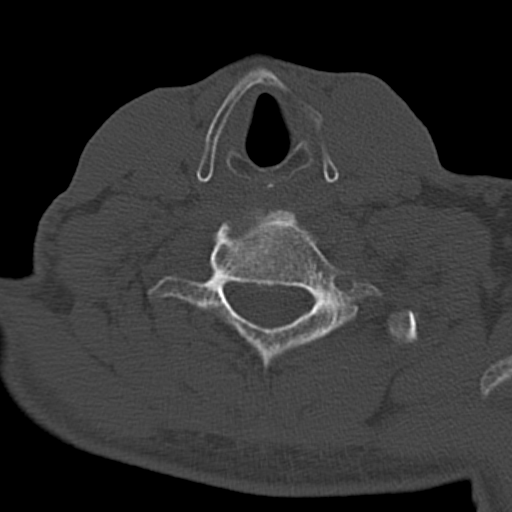
[im 27/87  bone]
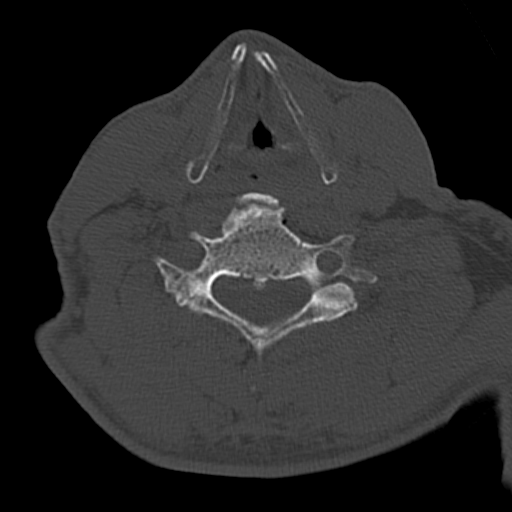
[im 34/87  soft-tissue]
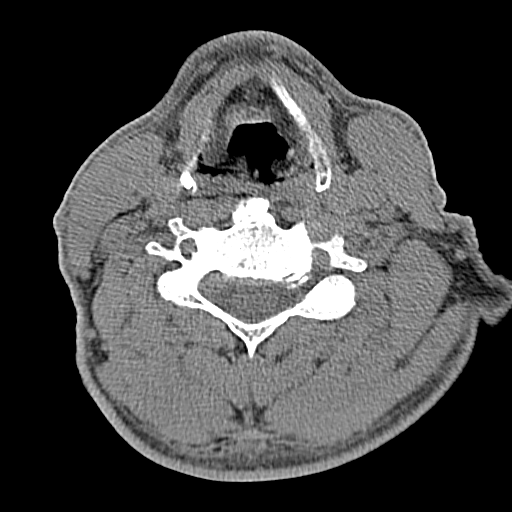
[im 34/87  bone]
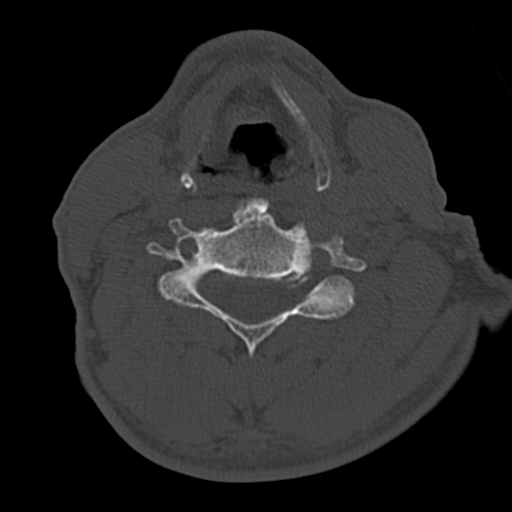
[im 40/87  bone]
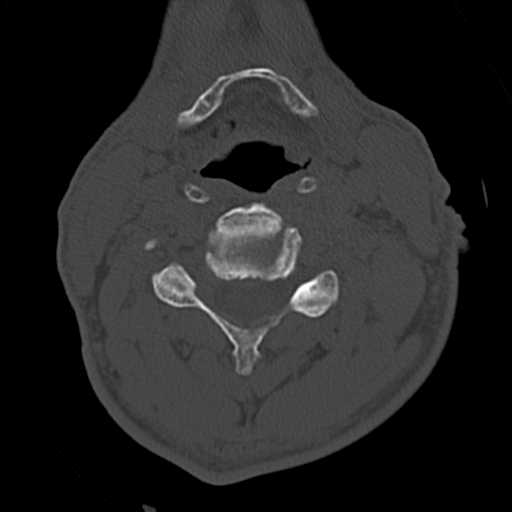
[im 47/87  bone]
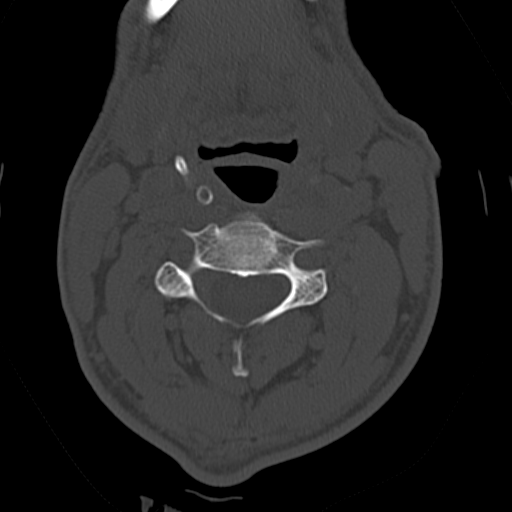
[im 53/87  bone]
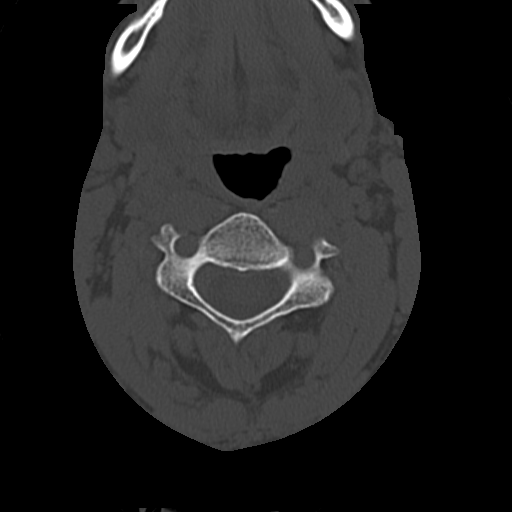
[im 60/87  soft-tissue]
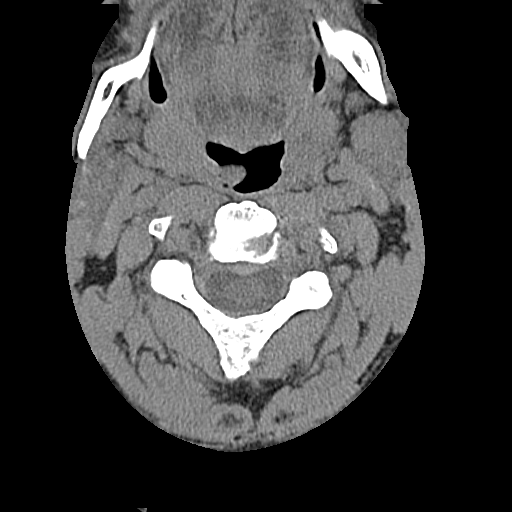
[im 60/87  bone]
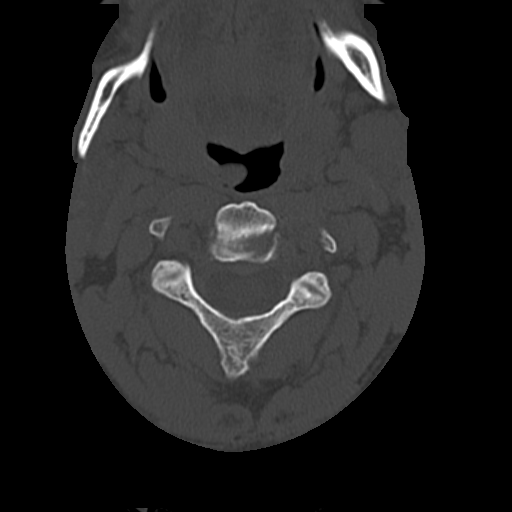
[im 67/87  bone]
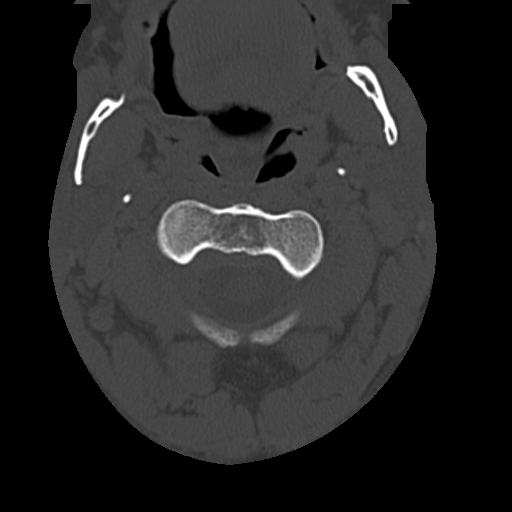
[im 73/87  bone]
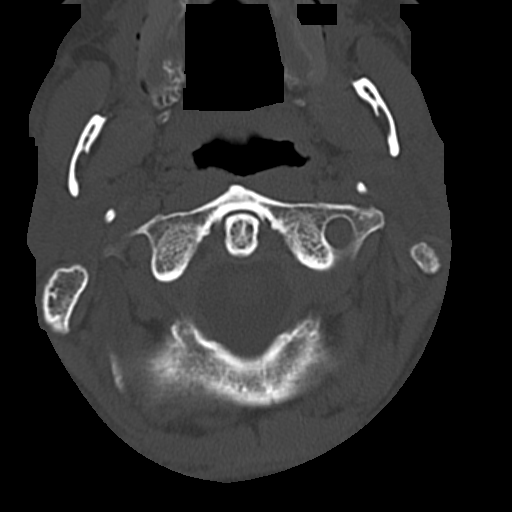
[im 80/87  bone]
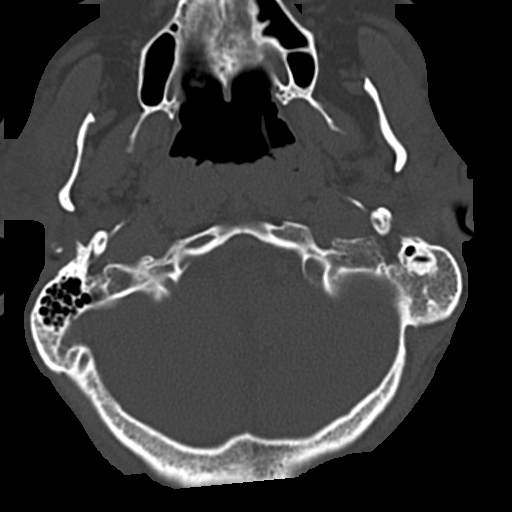

[12 of 14 positions shown; findings below may reference images not displayed]

PROCEDURE:     CT  - CT CERVICAL SPINE WO  - [DATE]  [DATE]

RESULT:      Multiplanar imaging of the cervical spine was obtained
utilizing a high-definition bone algorithm.

Evaluation of the cervical spine demonstrates no evidence of fracture or
dislocation.  There is straightening of the normal cervical lordosis.
Multilevel spondylolysis is identified.  At the C5-6 level, moderate to
severe degenerative disc disease is appreciated with associated endplate
hypertrophic spurring. The spurs along the posterior aspect of the endplate
causes canal stenosis at this level which appears to be moderate to severe.
No further region of canal stenosis is appreciated.  The prevertebral soft
tissues are unremarkable.
IMPRESSION: 1.     No evidence of acute osseous abnormalities.
2.     Area of canal stenosis at the C4-6 level secondary to degenerative
changes. This appears to be moderate to severe.  Mass effect upon the
cervical cord at this level cannot be excluded. These findings can be
further evaluated with MRI if clinically warranted.
3.     Not mentioned above, emphysematous changes are appreciated within the
lung apices. An area of spiculated density projects within the LEFT lung
apex. This may represent apical scarring.  Correlation with Chest CT and/or
chest radiograph is recommended.
4.     Dr. BARRAGAN of the Emergency Department was informed of these
findings via preliminary fax report on [DATE] at [DATE] p.m. EST.

## 2008-07-10 IMAGING — CR RIGHT TIBIA AND FIBULA - 2 VIEW
1 series · 3 of 3 positions shown · non-contrast
Comparison: none

REASON FOR EXAM: s/p R tibial IM nail
COMMENTS:   Bedside (portable):Y

[Series 1: view not recorded · 0.17mm/px · 3 of 3 slices shown]
[im 1/3]
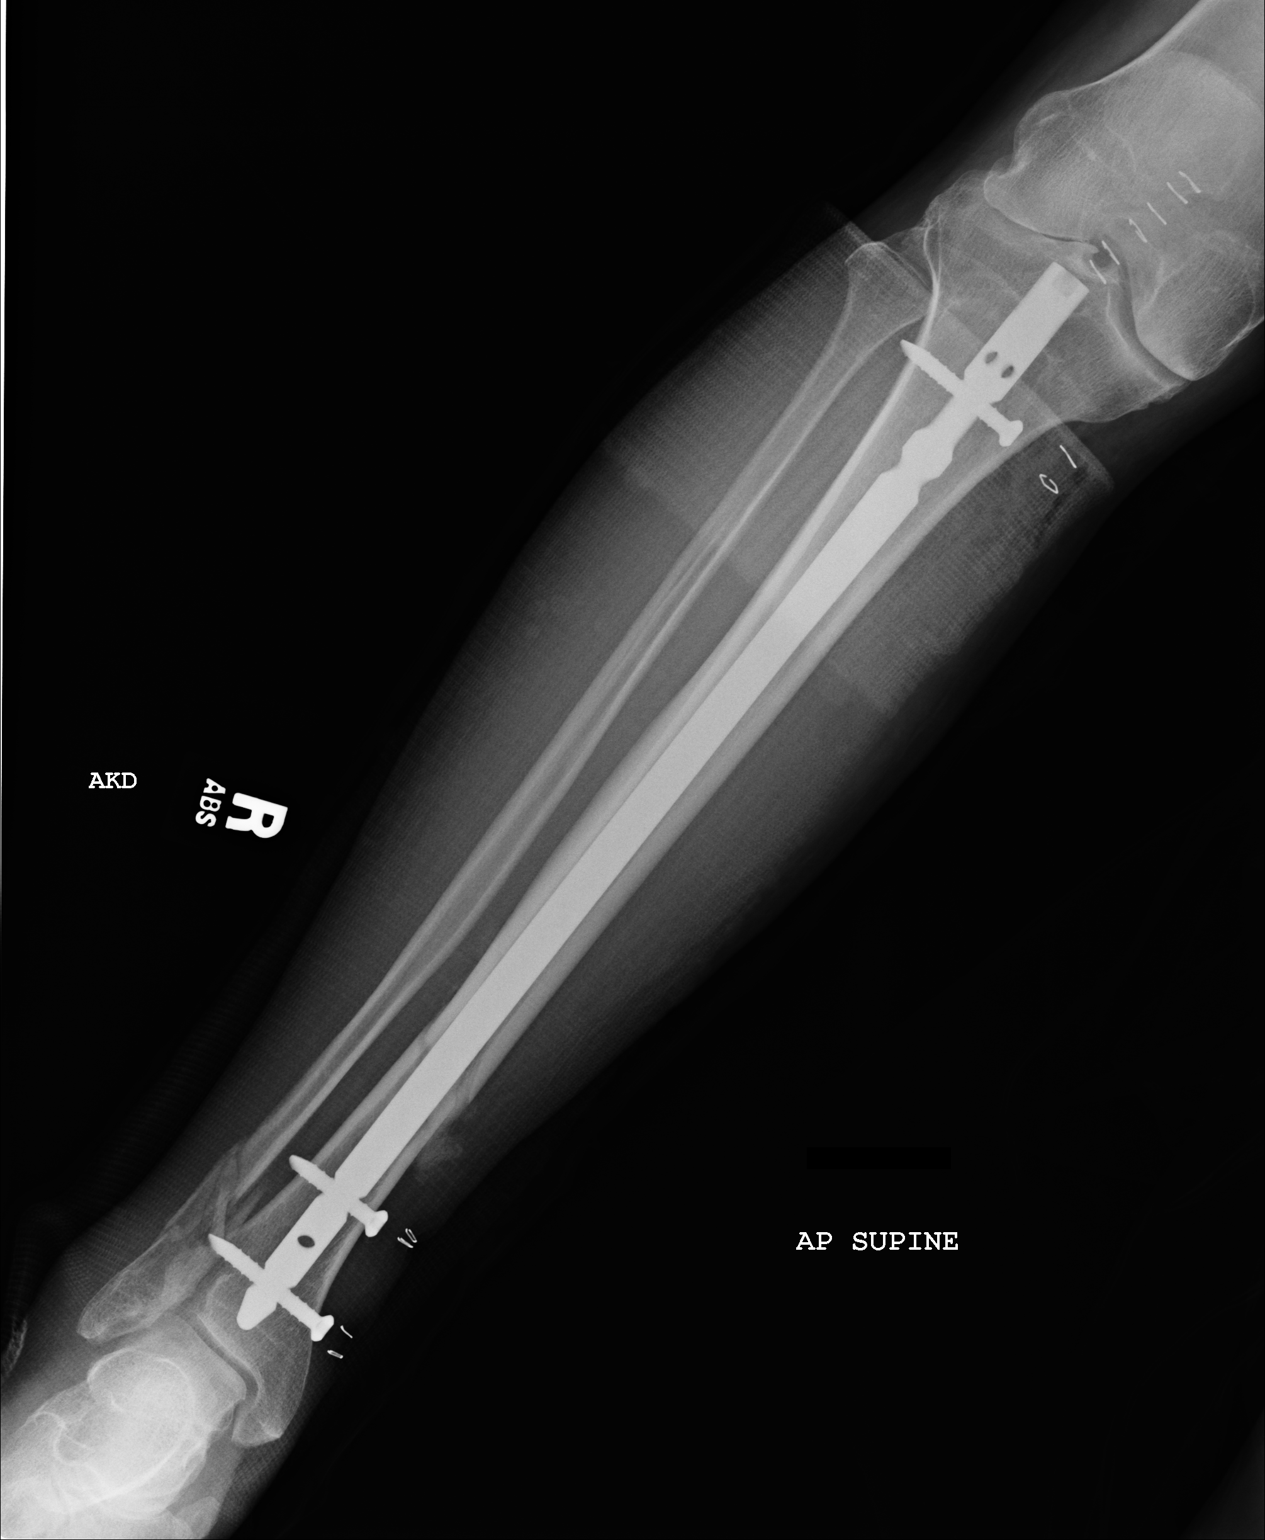
[im 2/3]
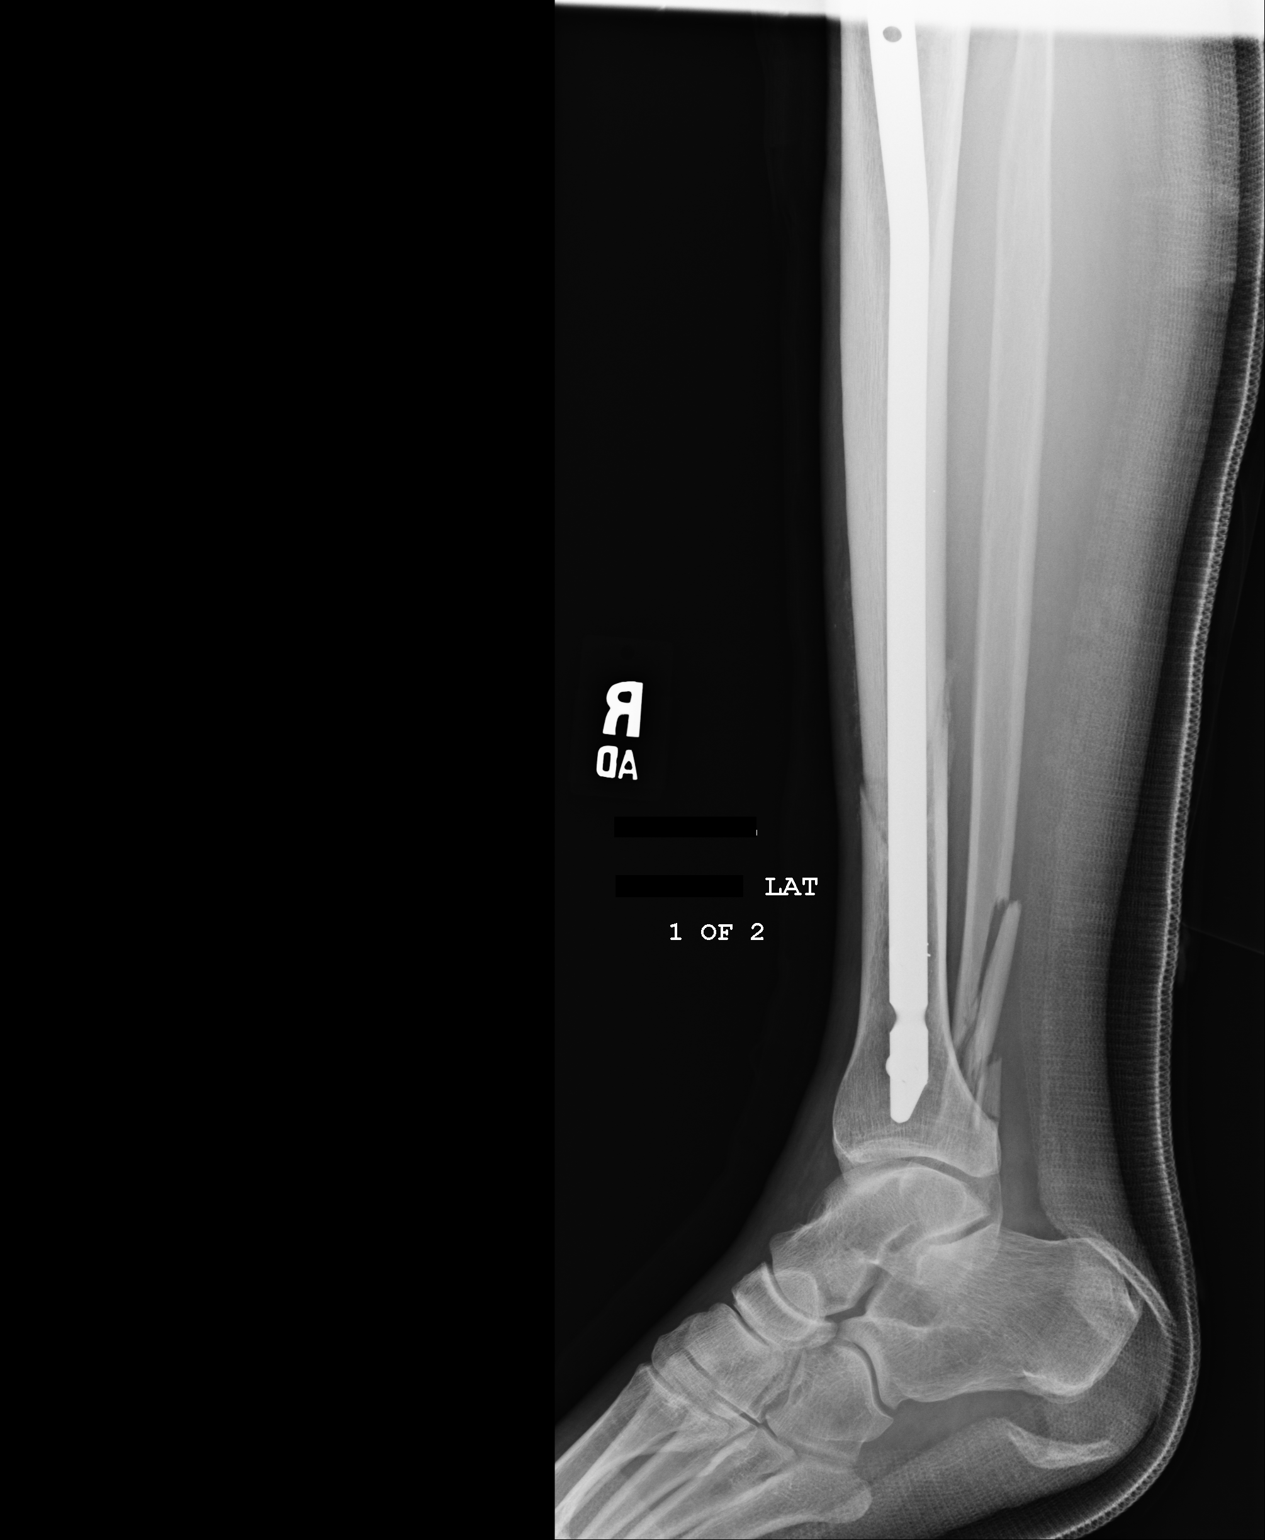
[im 3/3]
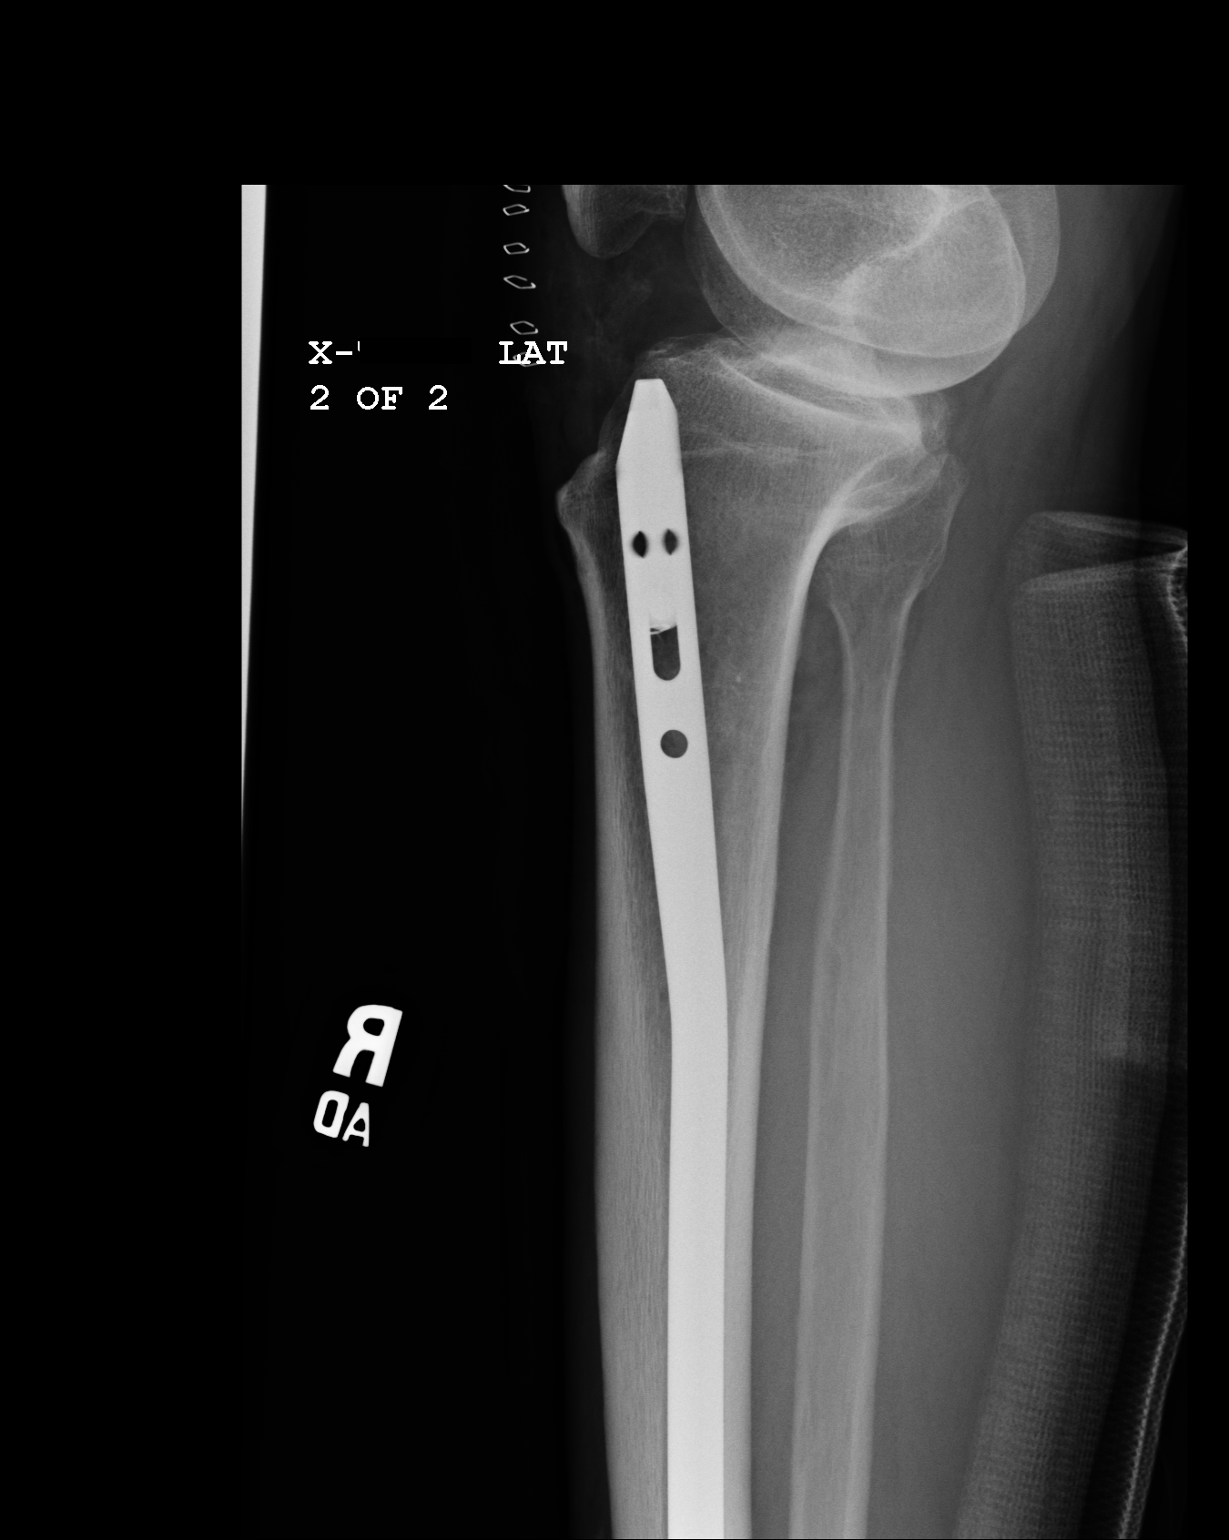

[3 of 3 positions shown; findings below may reference images not displayed]

RESULT:     Comparison is made to the preoperative exam of [DATE]. The
previously observed oblique fracture of the tibial diaphysis has been
reduced and internally stabilized by an intramedullary rod that is
transfixed by metallic screws proximally and distally. There is a bony
density along the medial aspect of the fracture site. This may represent a
bone fragment in the soft tissue or may represent artifact from the
posterior splint that is present. A comminuted fracture of the fibula is
again noted. The bony fracture fragments at the fibular site appear to be in
good position and alignment. As noted above, a posterior splint is present.
IMPRESSION: Please see above.

## 2008-09-04 ENCOUNTER — Ambulatory Visit: Payer: Self-pay

## 2008-09-04 IMAGING — CR RIGHT TIBIA AND FIBULA - 2 VIEW
1 series · 2 of 2 positions shown · non-contrast
Comparison: none

REASON FOR EXAM: scoliosis nerve damage in groin fax [EMAIL] [2N] [PHONE_NUMBER]
Dr TIGER
COMMENTS:

[Series 1: view not recorded · 0.17mm/px · 2 of 2 slices shown]
[im 1/2]
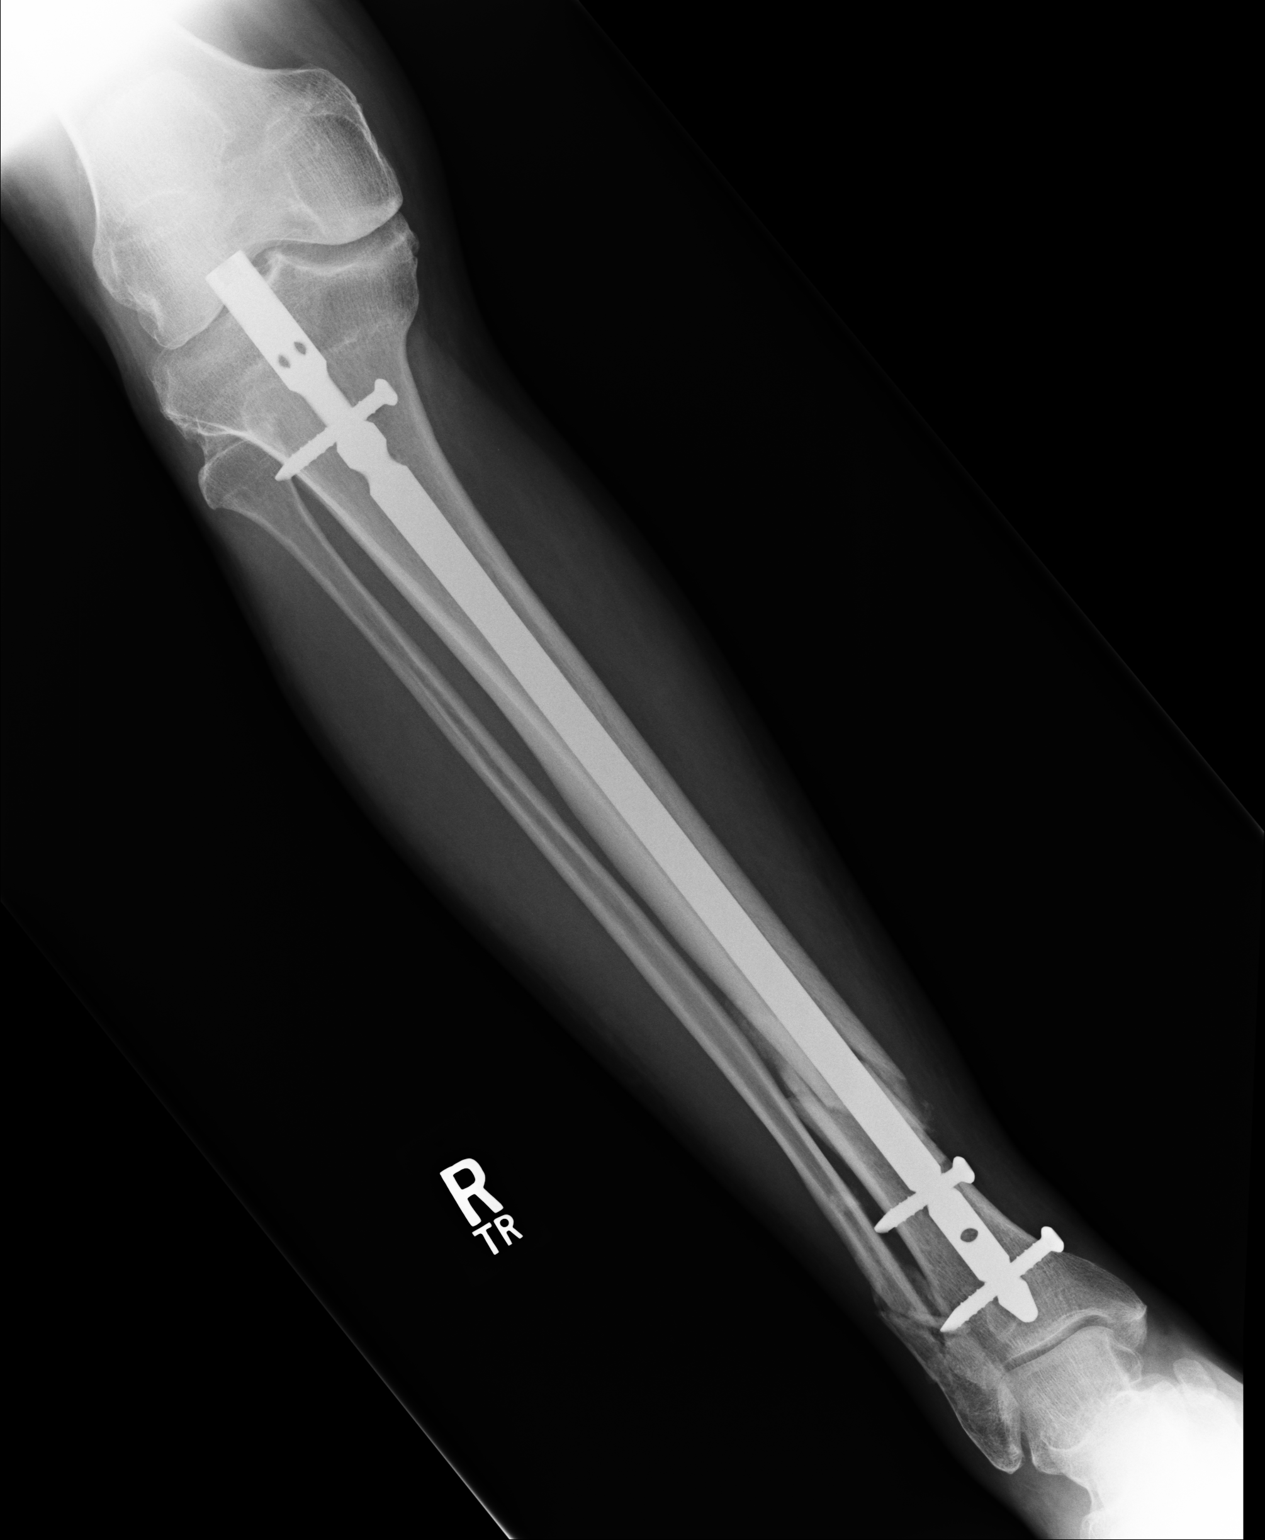
[im 2/2]
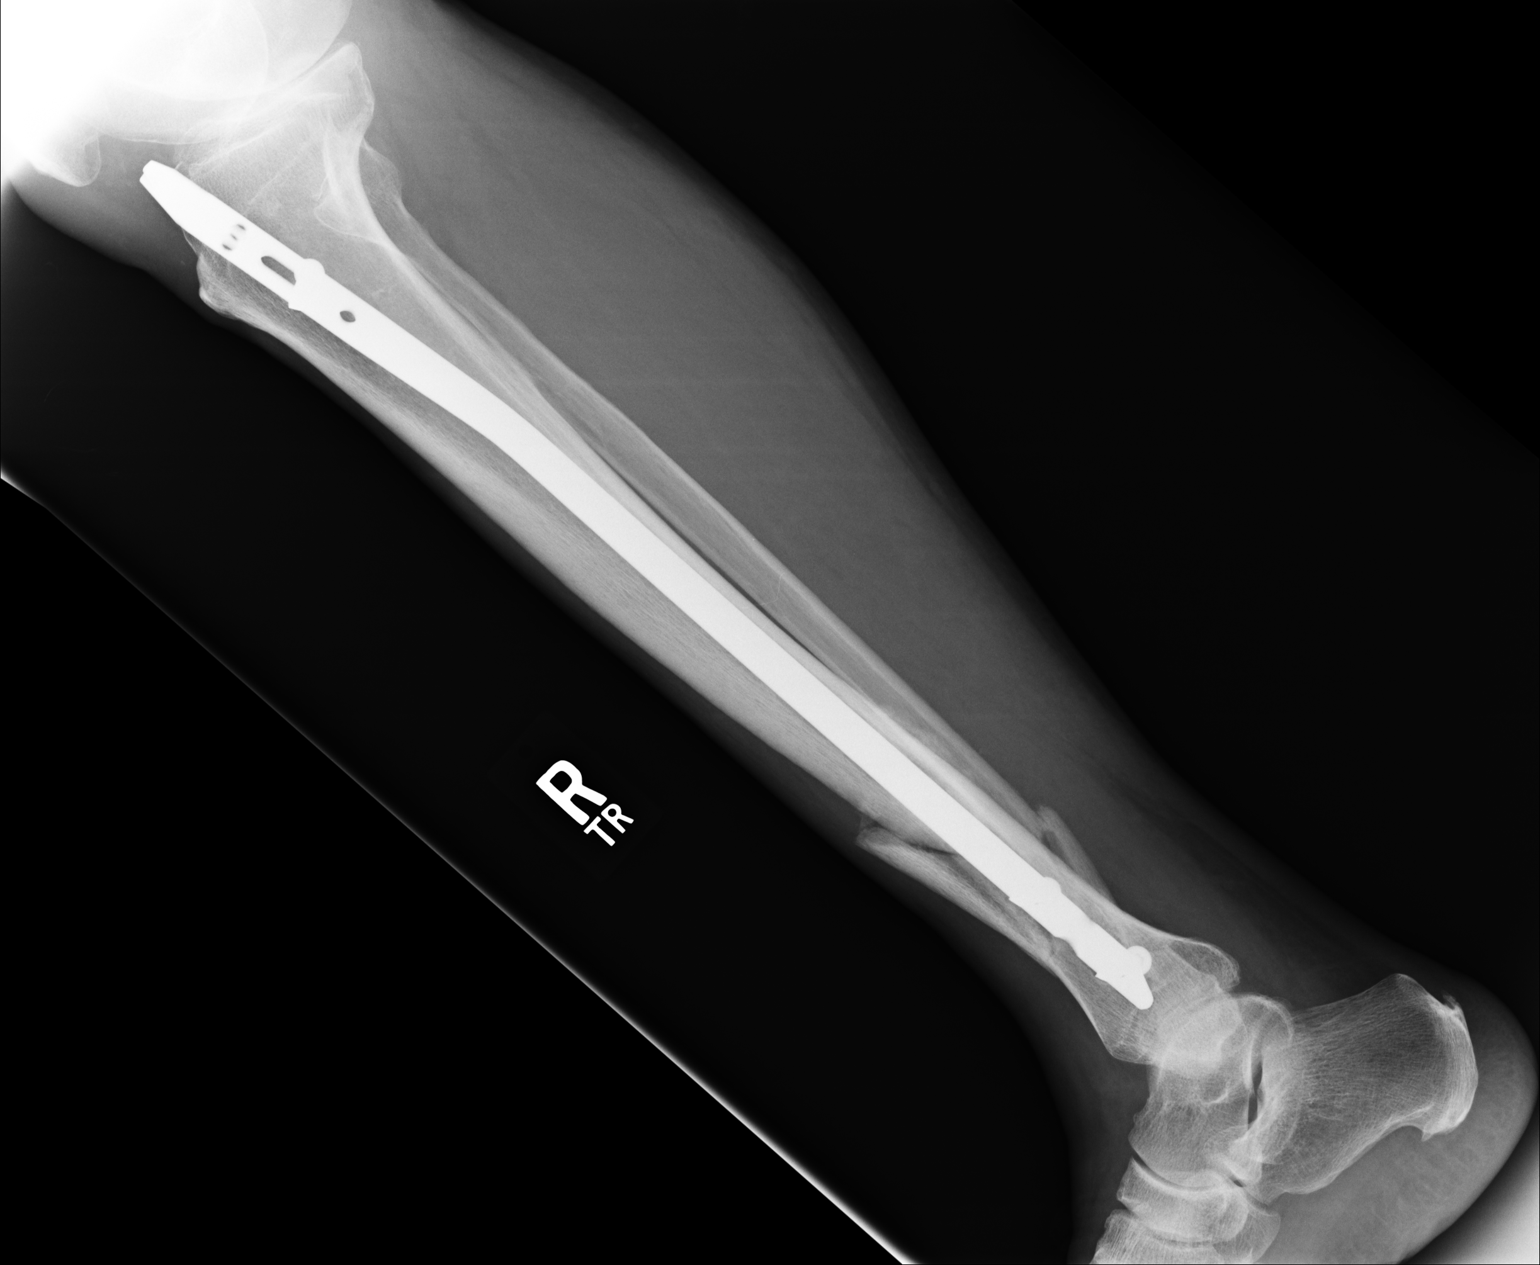

[2 of 2 positions shown; findings below may reference images not displayed]

RESULT:     There is an intramedullary rod present in the RIGHT tibia with a
single fixation screw proximally and two screws seen distally. This
traverses the distal tibial fracture. There is no evidence of complication
compared to the previous study of [DATE]. There is evidence of healing in
the tibial and fibular fractures. No new fracture is evident.
IMPRESSION: Please see above.

## 2008-09-04 IMAGING — CR DG LUMBAR SPINE 2-3V
1 series · 3 of 3 positions shown · non-contrast
Comparison: none

REASON FOR EXAM: nerve damage in groin scoliosis DDS fax [UE] [PHONE_NUMBER] Dr
HIRAI
COMMENTS:

[Series 1: view not recorded · 0.17mm/px · 3 of 3 slices shown]
[im 1/3]
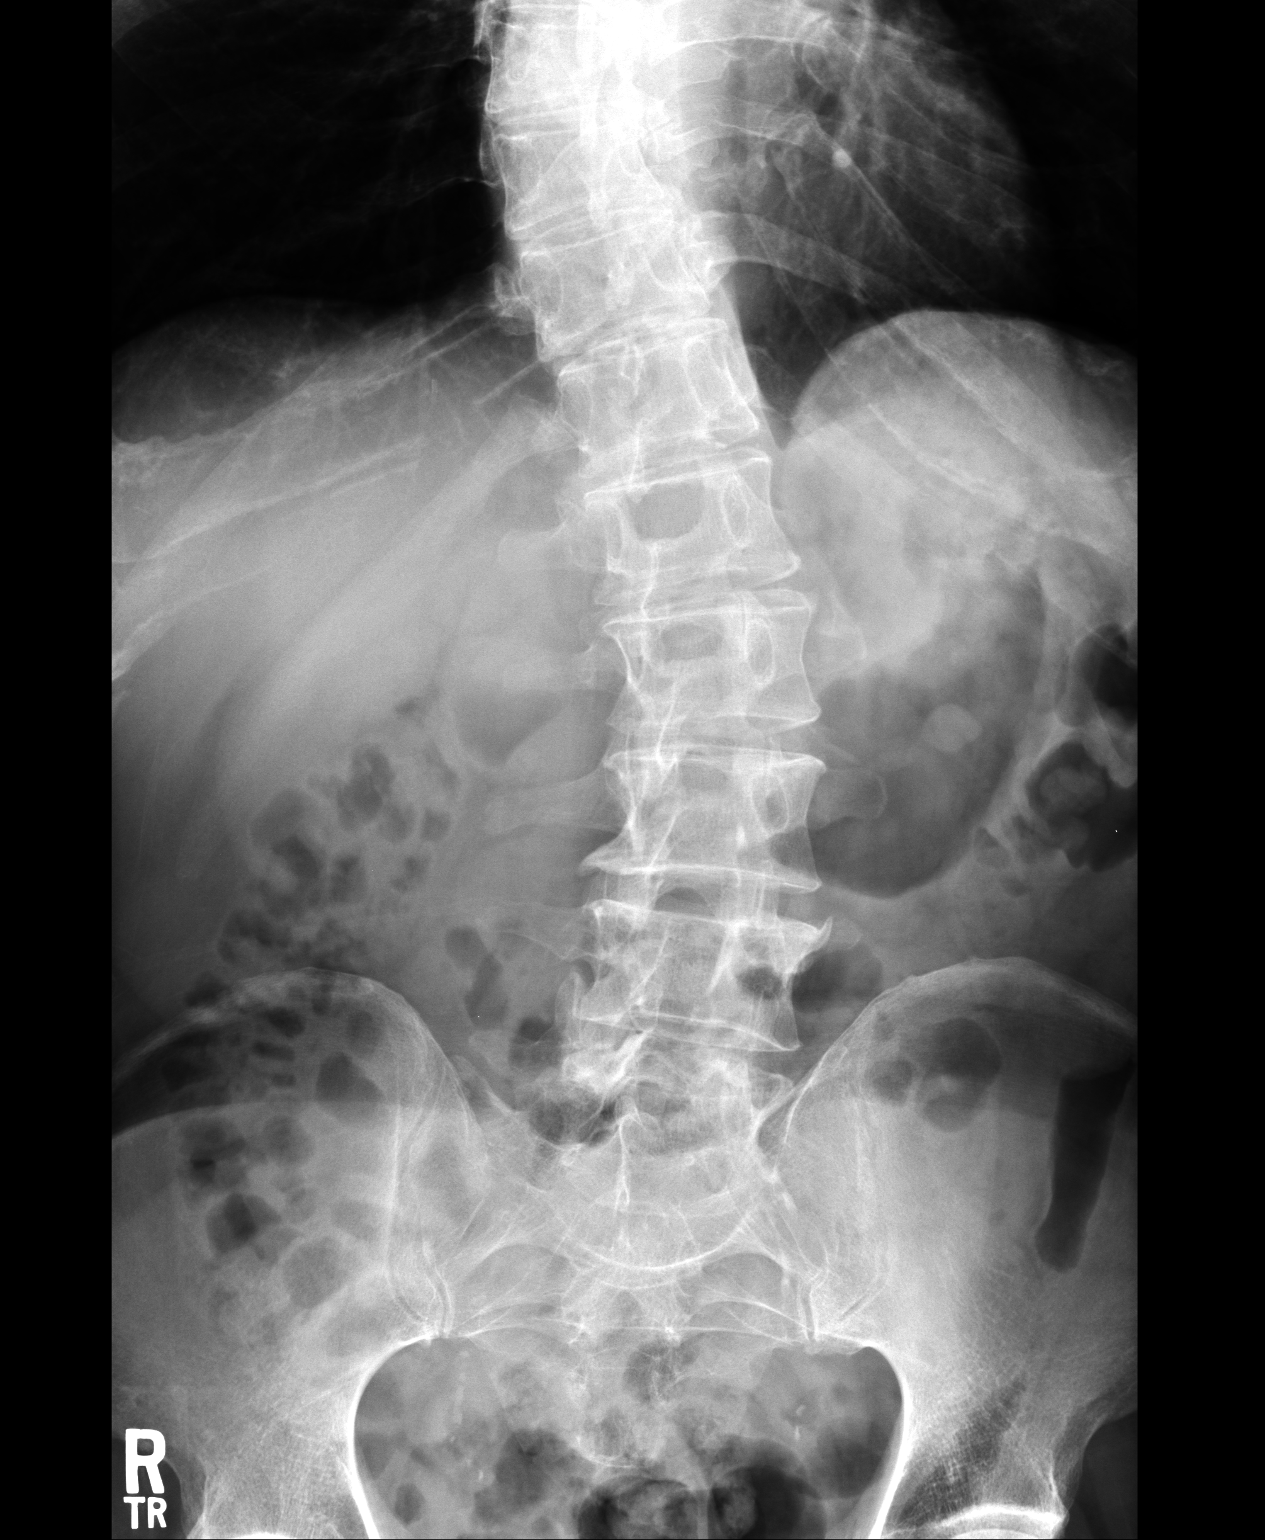
[im 2/3]
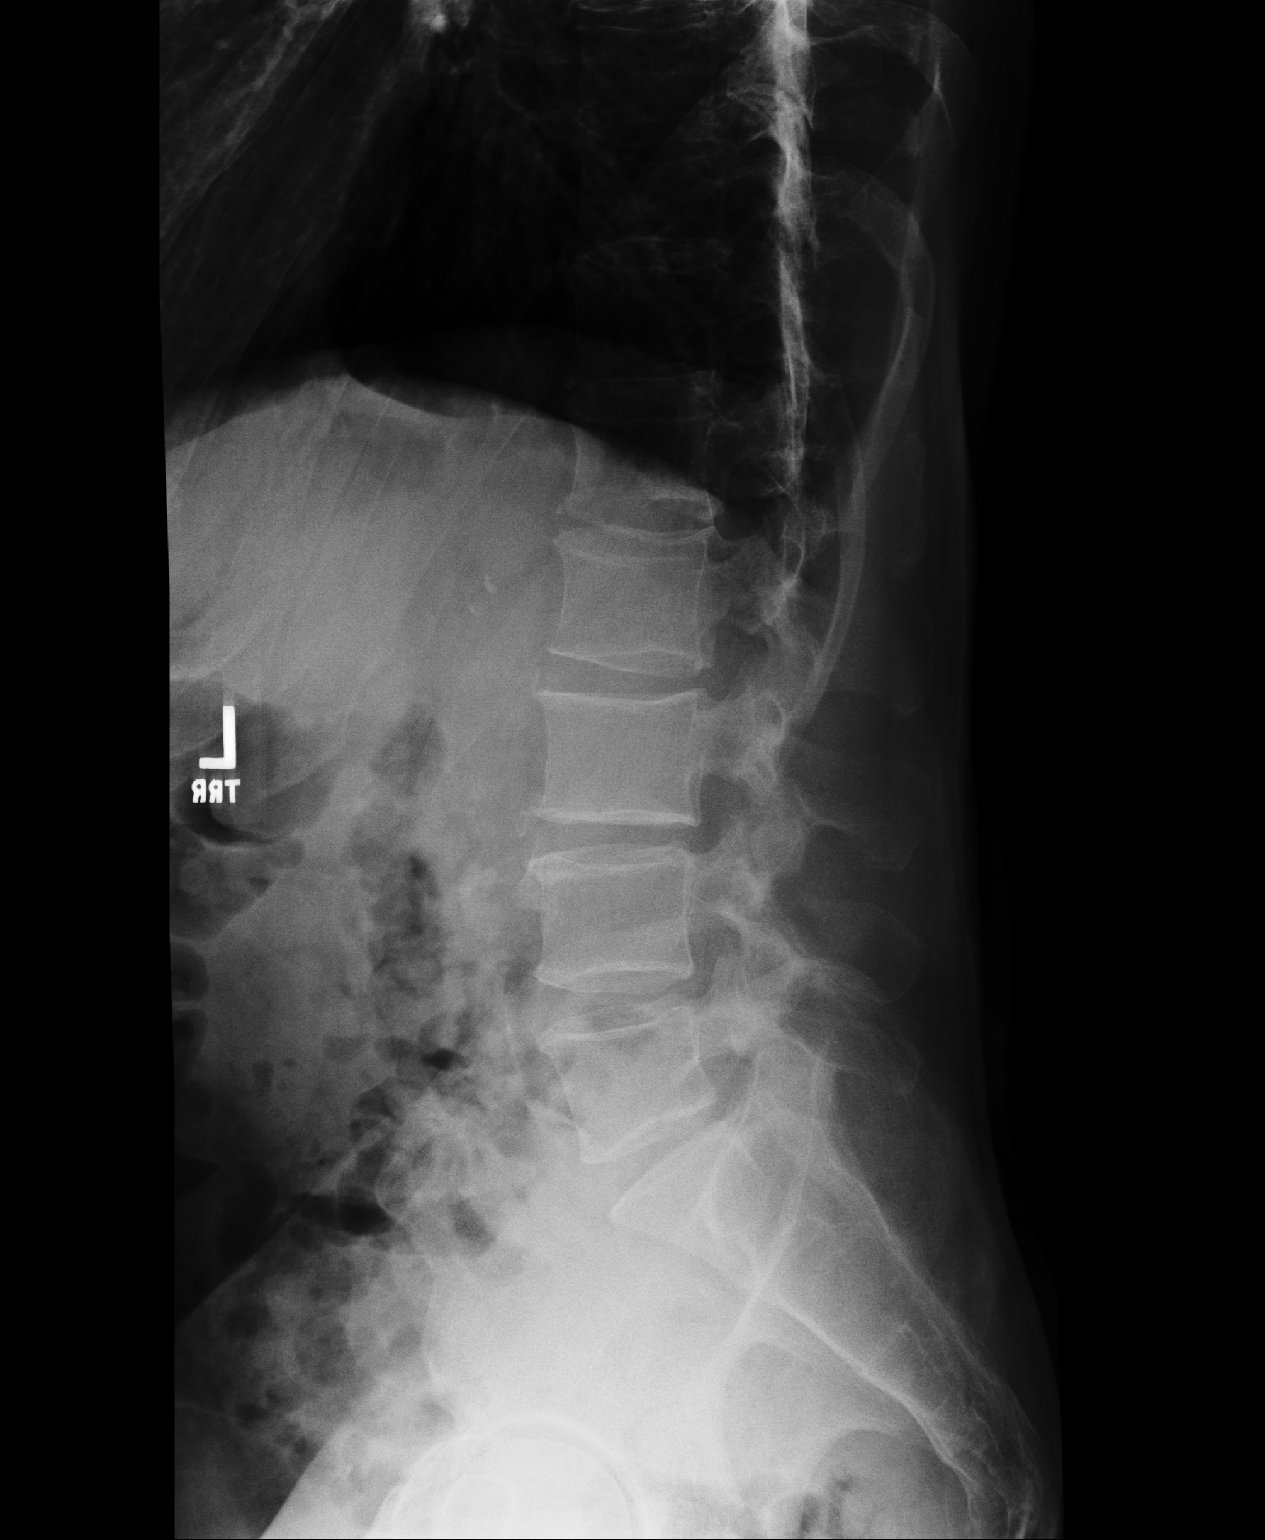
[im 3/3]
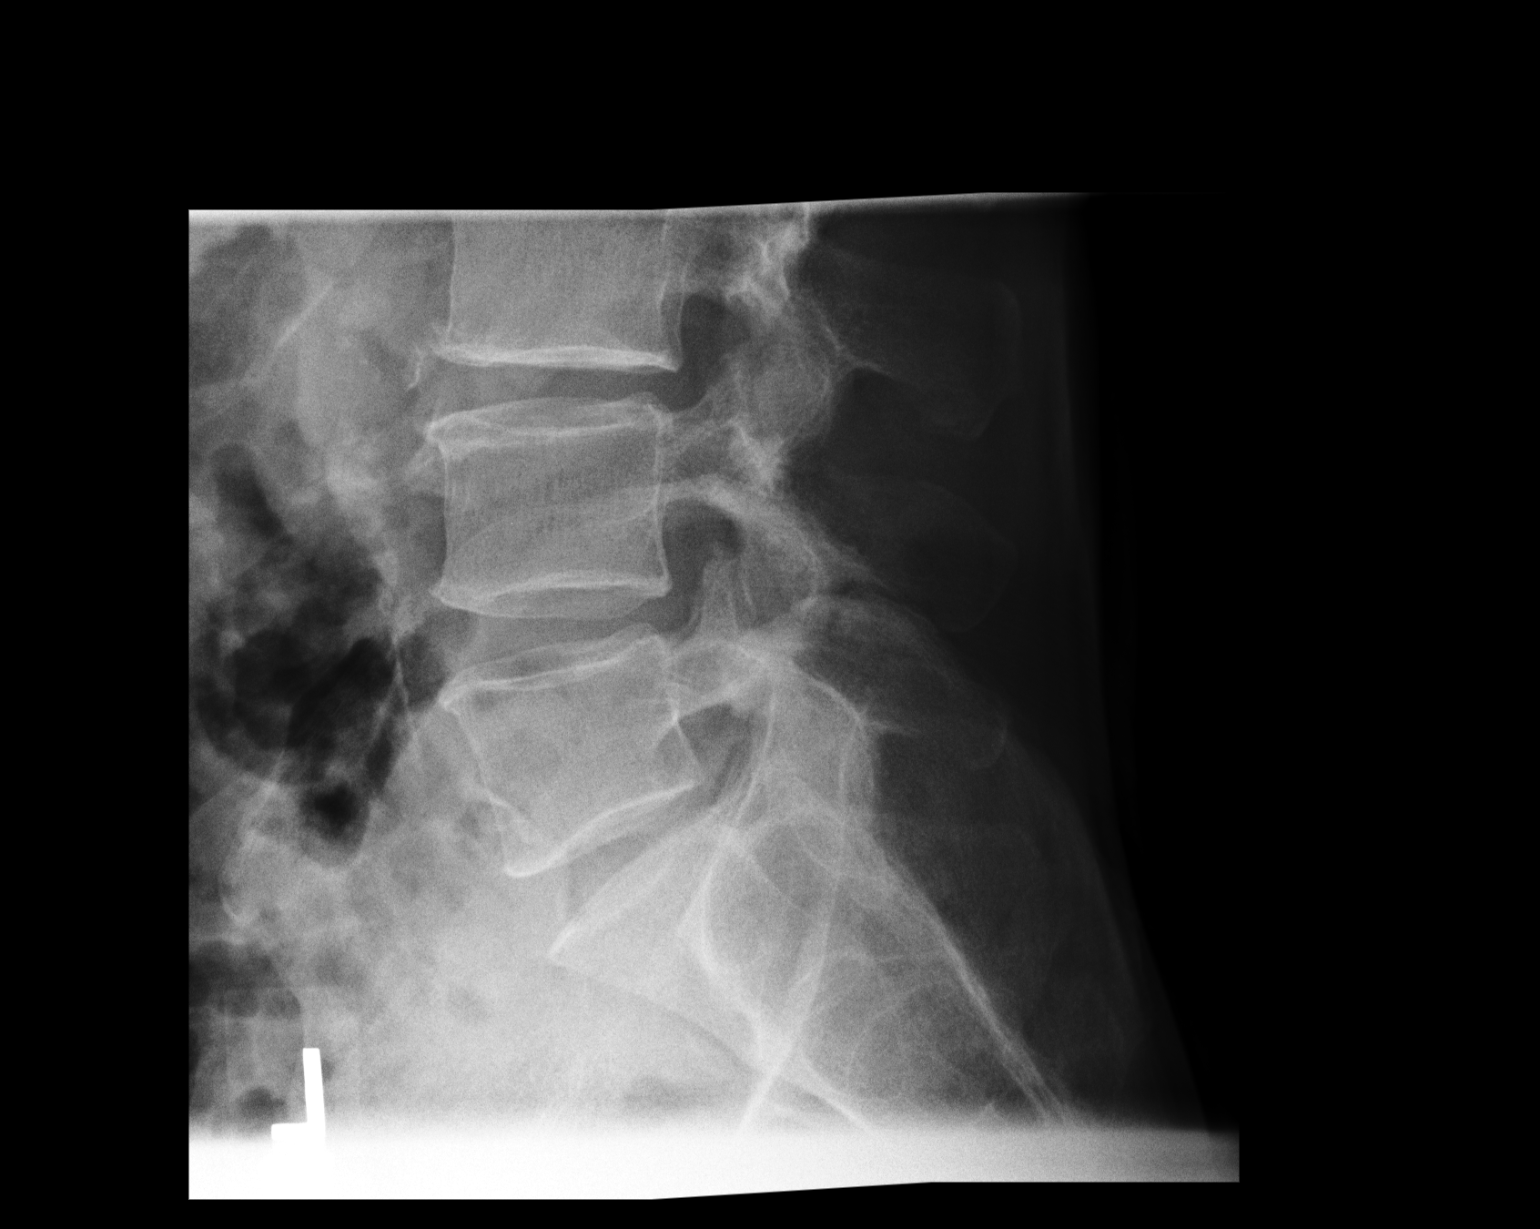

[3 of 3 positions shown; findings below may reference images not displayed]

PROCEDURE:     DXR - DXR LUMBAR SPINE AP AND LATERAL  - [DATE]  [DATE]

RESULT:     There is a rotoscoliosis concave to the RIGHT centered at the
L1-L2 level. The bowel gas pattern is unremarkable. The vertebral body
heights and intervertebral disc spaces are maintained. No fracture is
evident. Atherosclerotic calcification is noted.
IMPRESSION: 1. Scoliosis.
2. No acute abnormality.
3. Degenerative changes are present.

## 2009-04-16 ENCOUNTER — Inpatient Hospital Stay: Payer: Self-pay | Admitting: Unknown Physician Specialty

## 2009-08-02 ENCOUNTER — Emergency Department: Payer: Self-pay | Admitting: Internal Medicine

## 2010-10-15 ENCOUNTER — Emergency Department: Payer: Self-pay | Admitting: Emergency Medicine

## 2010-10-15 IMAGING — CR DG CHEST 2V
1 series · 2 of 2 positions shown · non-contrast
Comparison: none

REASON FOR EXAM: chest and shoulder pain
COMMENTS:   May transport without cardiac monitor

[Series 1: view not recorded · 0.17mm/px · 2 of 2 slices shown]
[im 1/2]
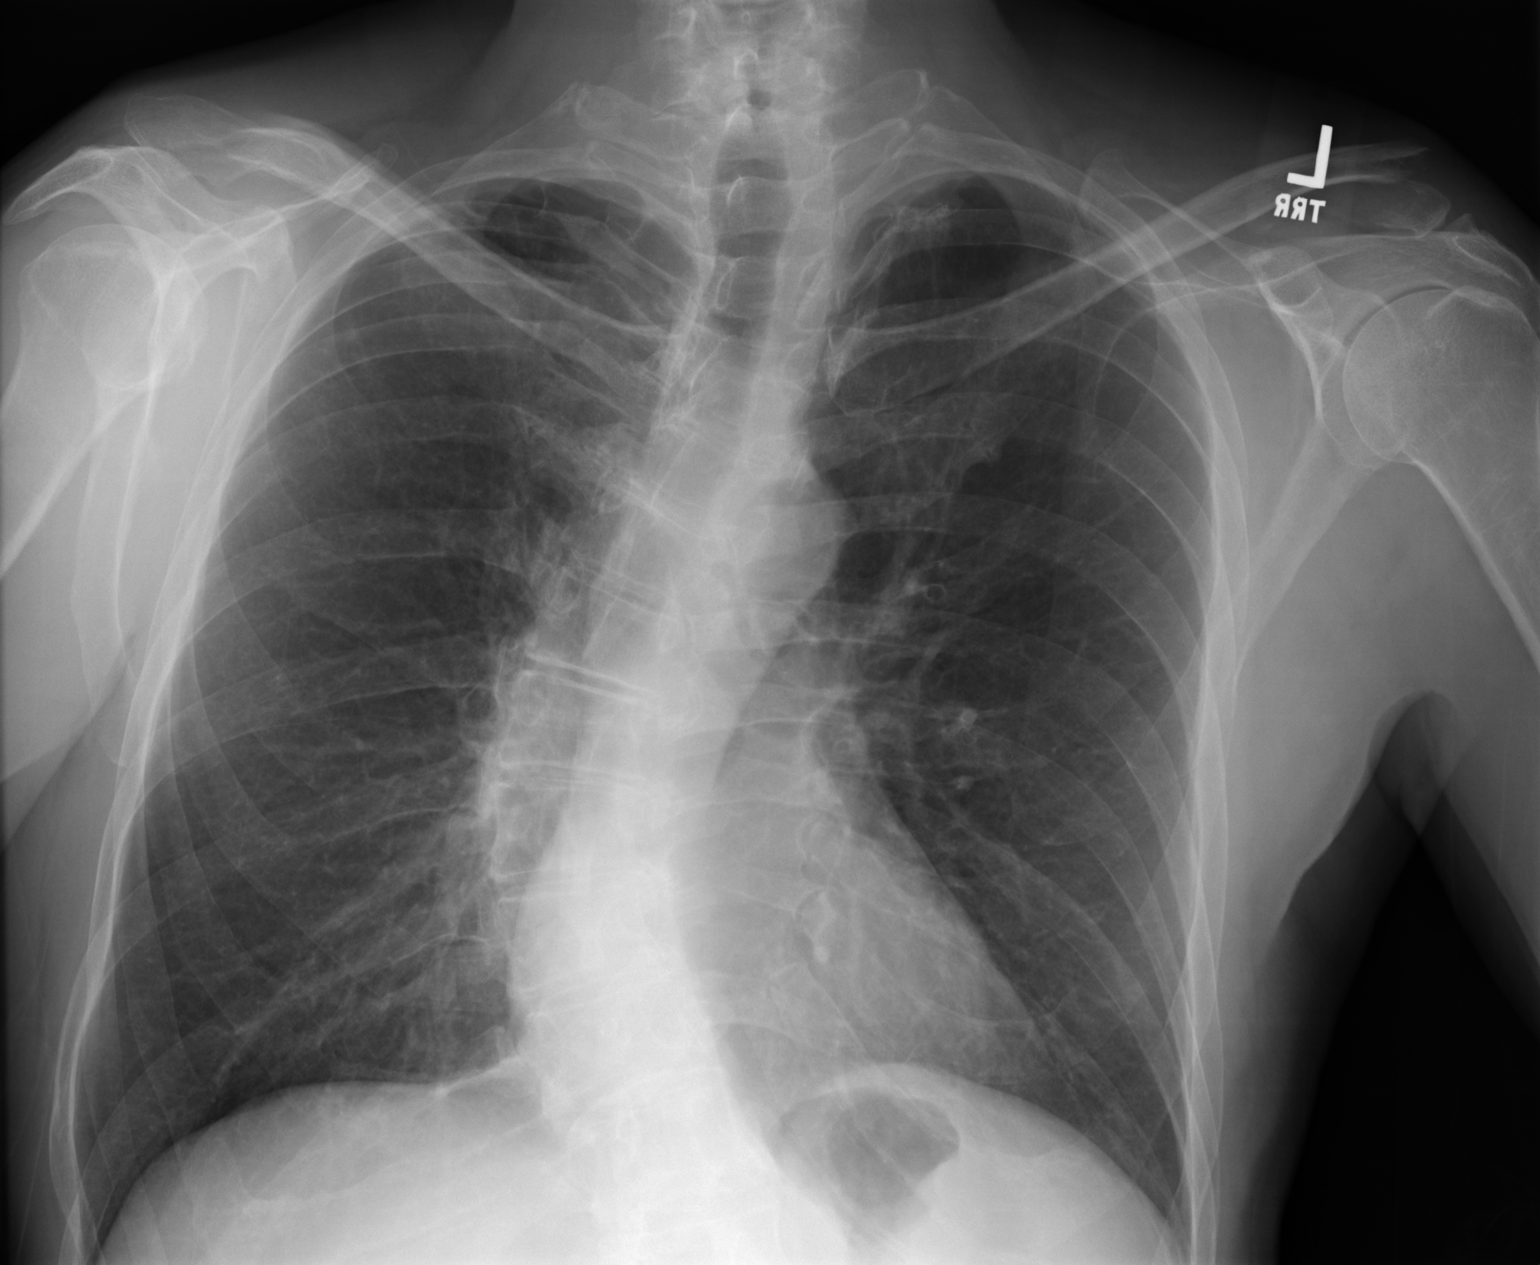
[im 2/2]
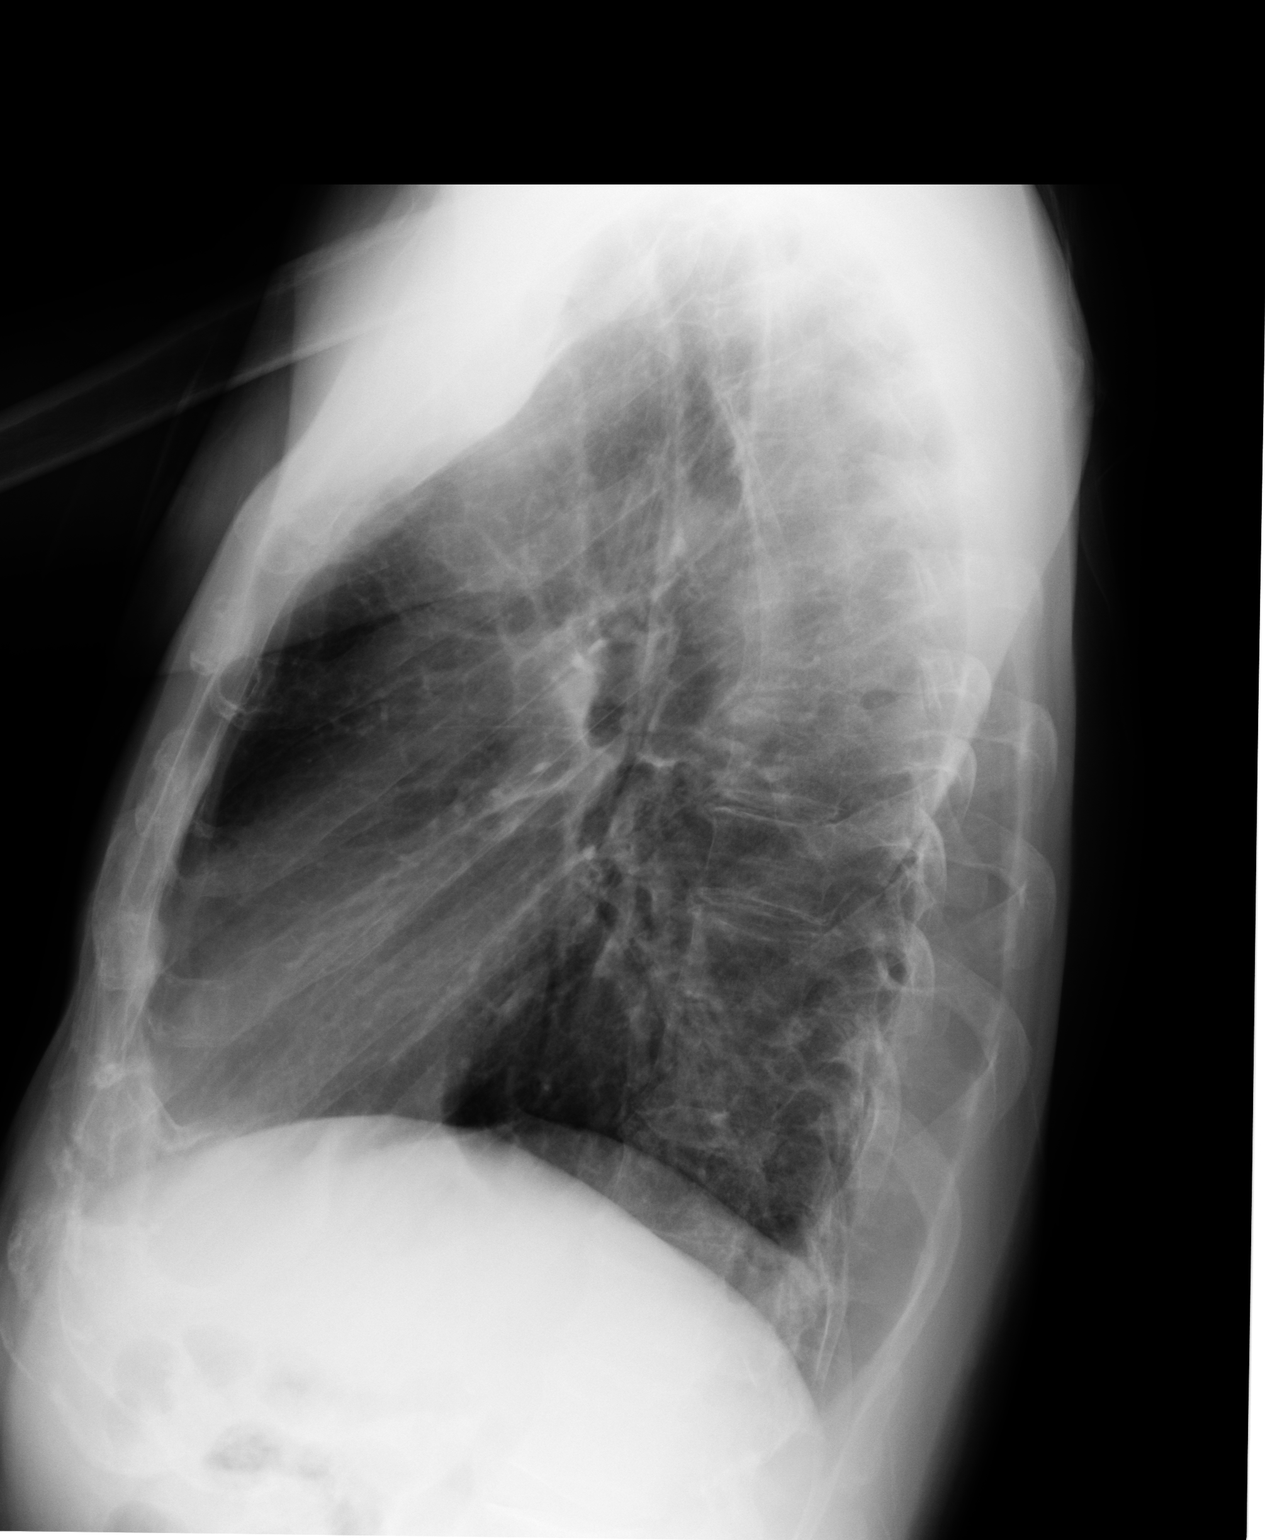

[2 of 2 positions shown; findings below may reference images not displayed]

PROCEDURE:     DXR - DXR CHEST PA (OR AP) AND LATERAL  - [DATE] [DATE]

RESULT:     Comparison is made to the prior exam of [DATE]. The lung
fields are clear. No pneumonia, pneumothorax or pleural effusion is seen.
Heart size is normal. There is a moderate thoracic scoliosis with a
convexity to the right. No acute bony abnormalities are seen.
IMPRESSION: No acute changes are identified.

## 2010-10-15 IMAGING — CR DG SHOULDER 3+V*L*
1 series · 3 of 3 positions shown · non-contrast
Comparison: none

REASON FOR EXAM: chest and shoulder pain
COMMENTS:   May transport without cardiac monitor

[Series 1: view not recorded · 0.17mm/px · 3 of 3 slices shown]
[im 1/3]
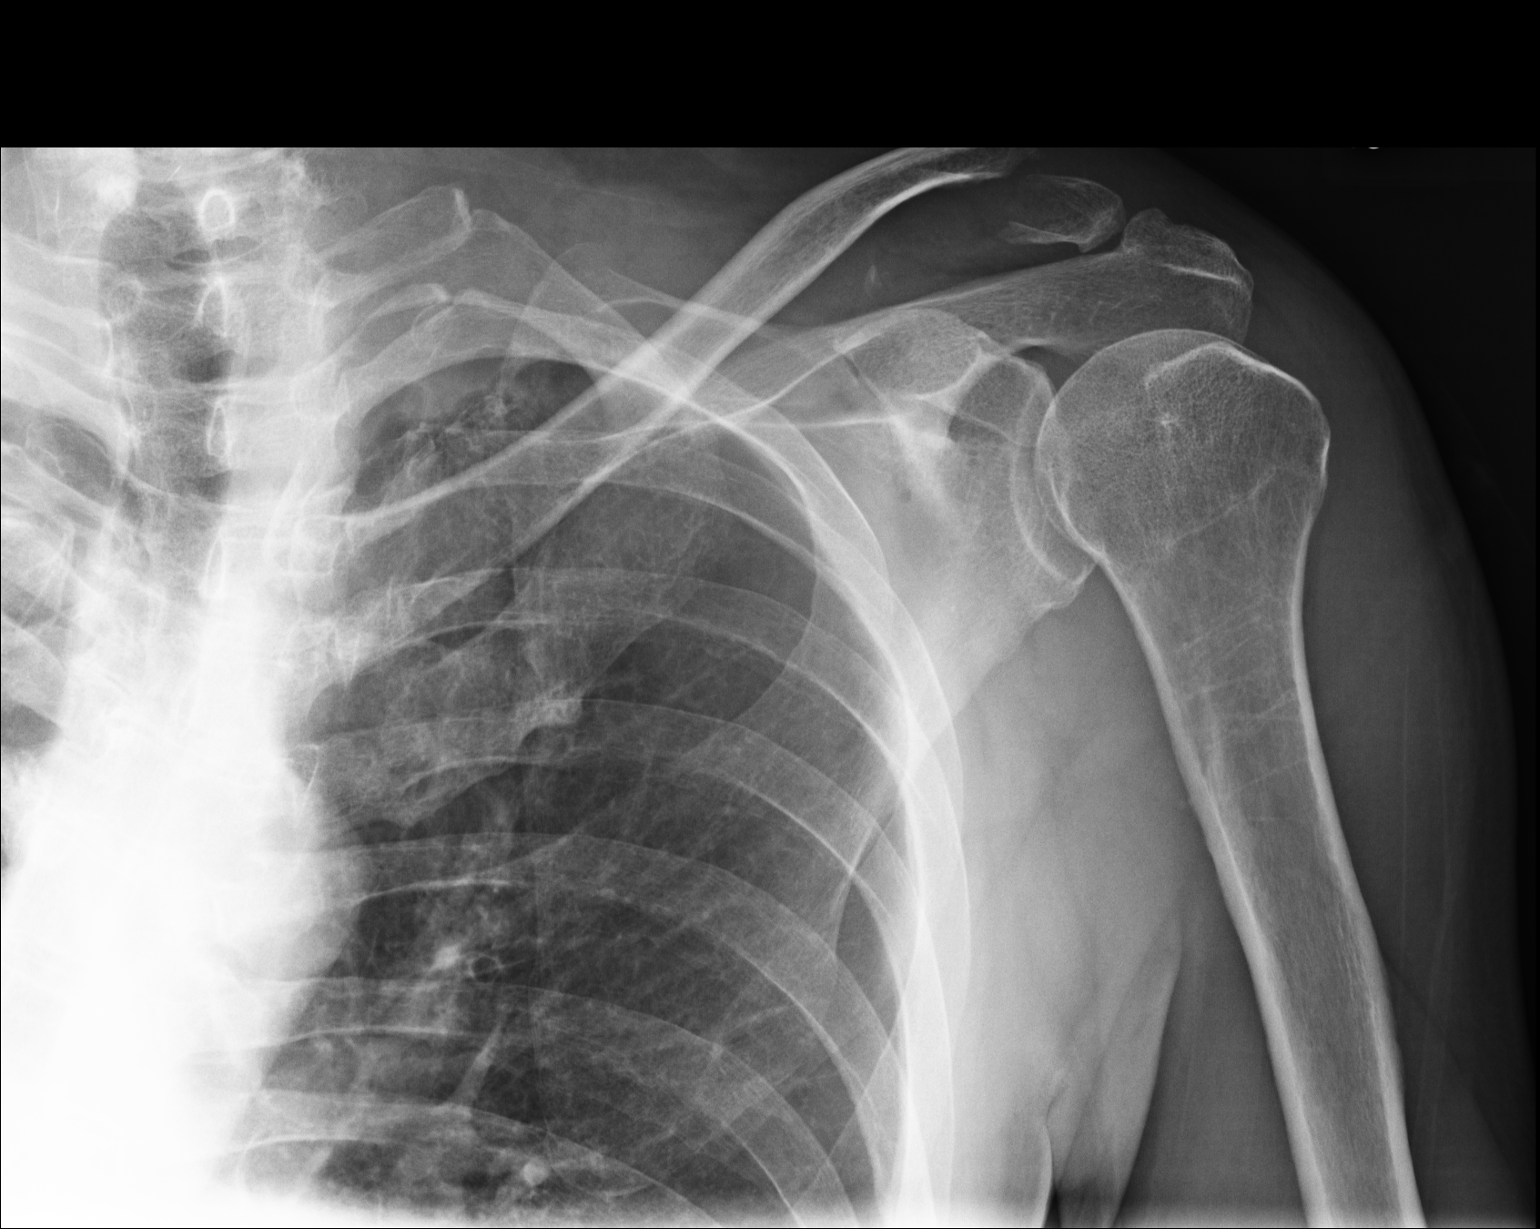
[im 2/3]
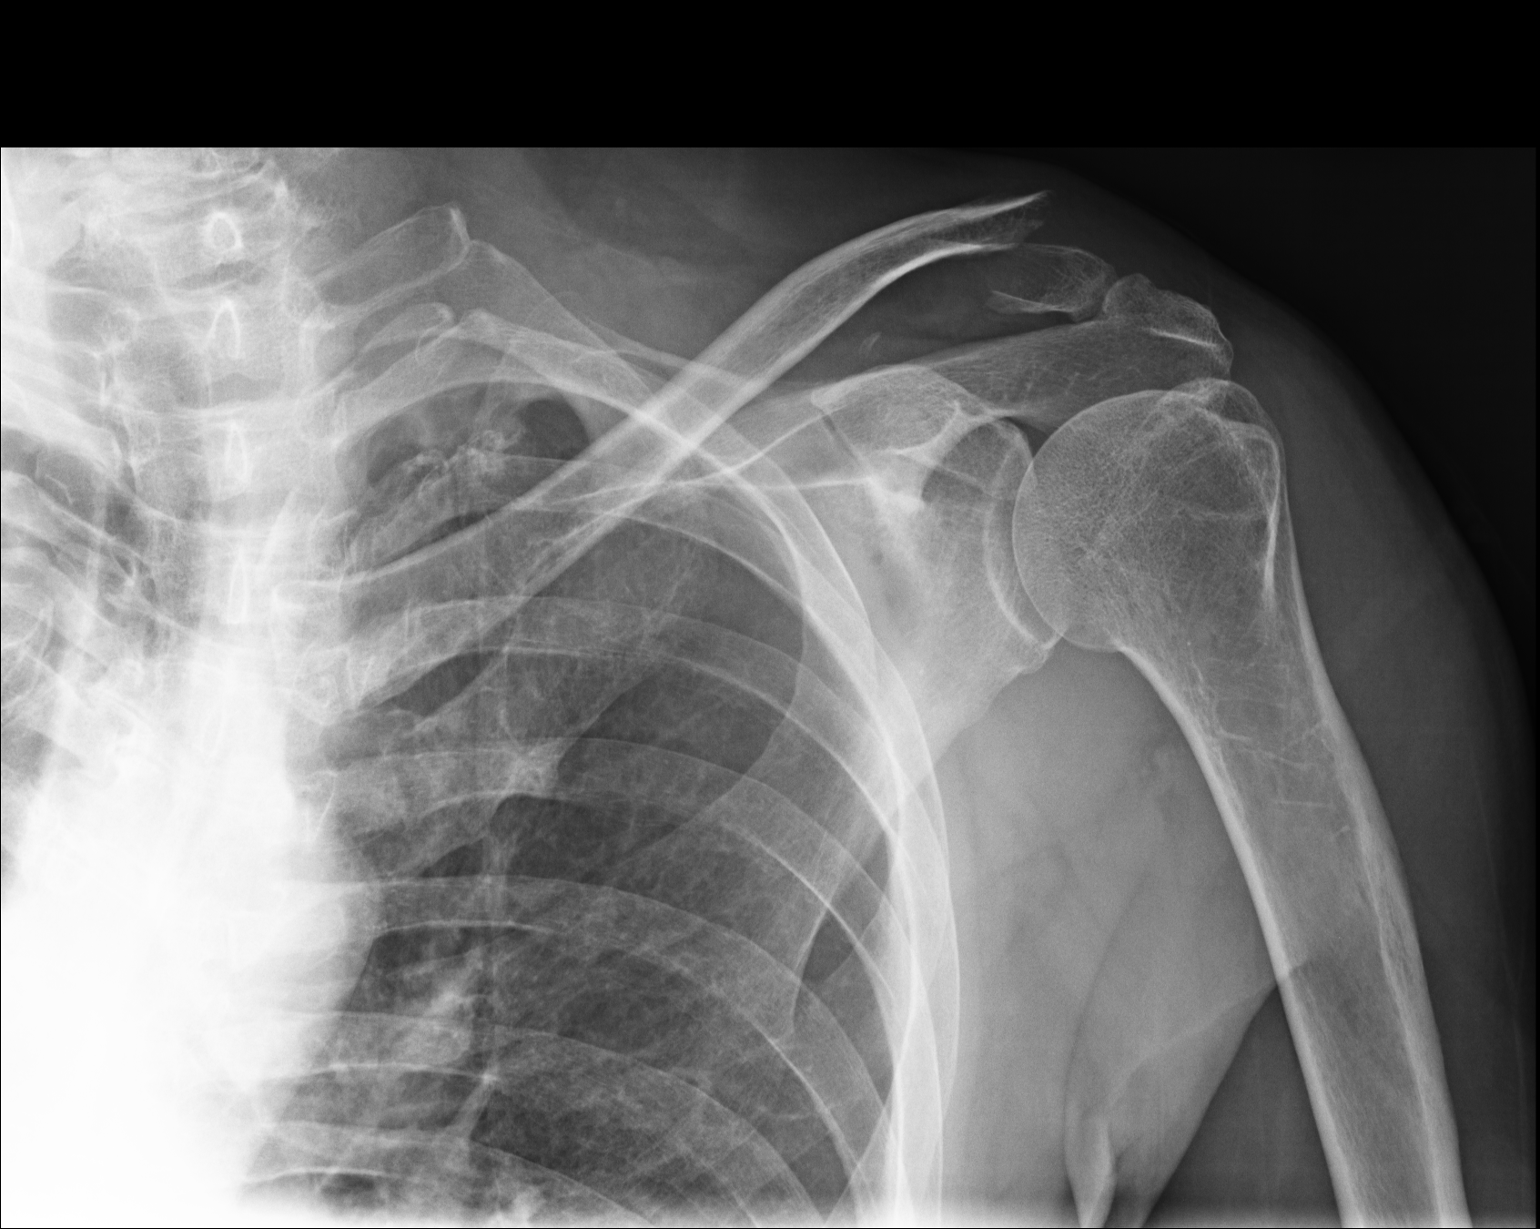
[im 3/3]
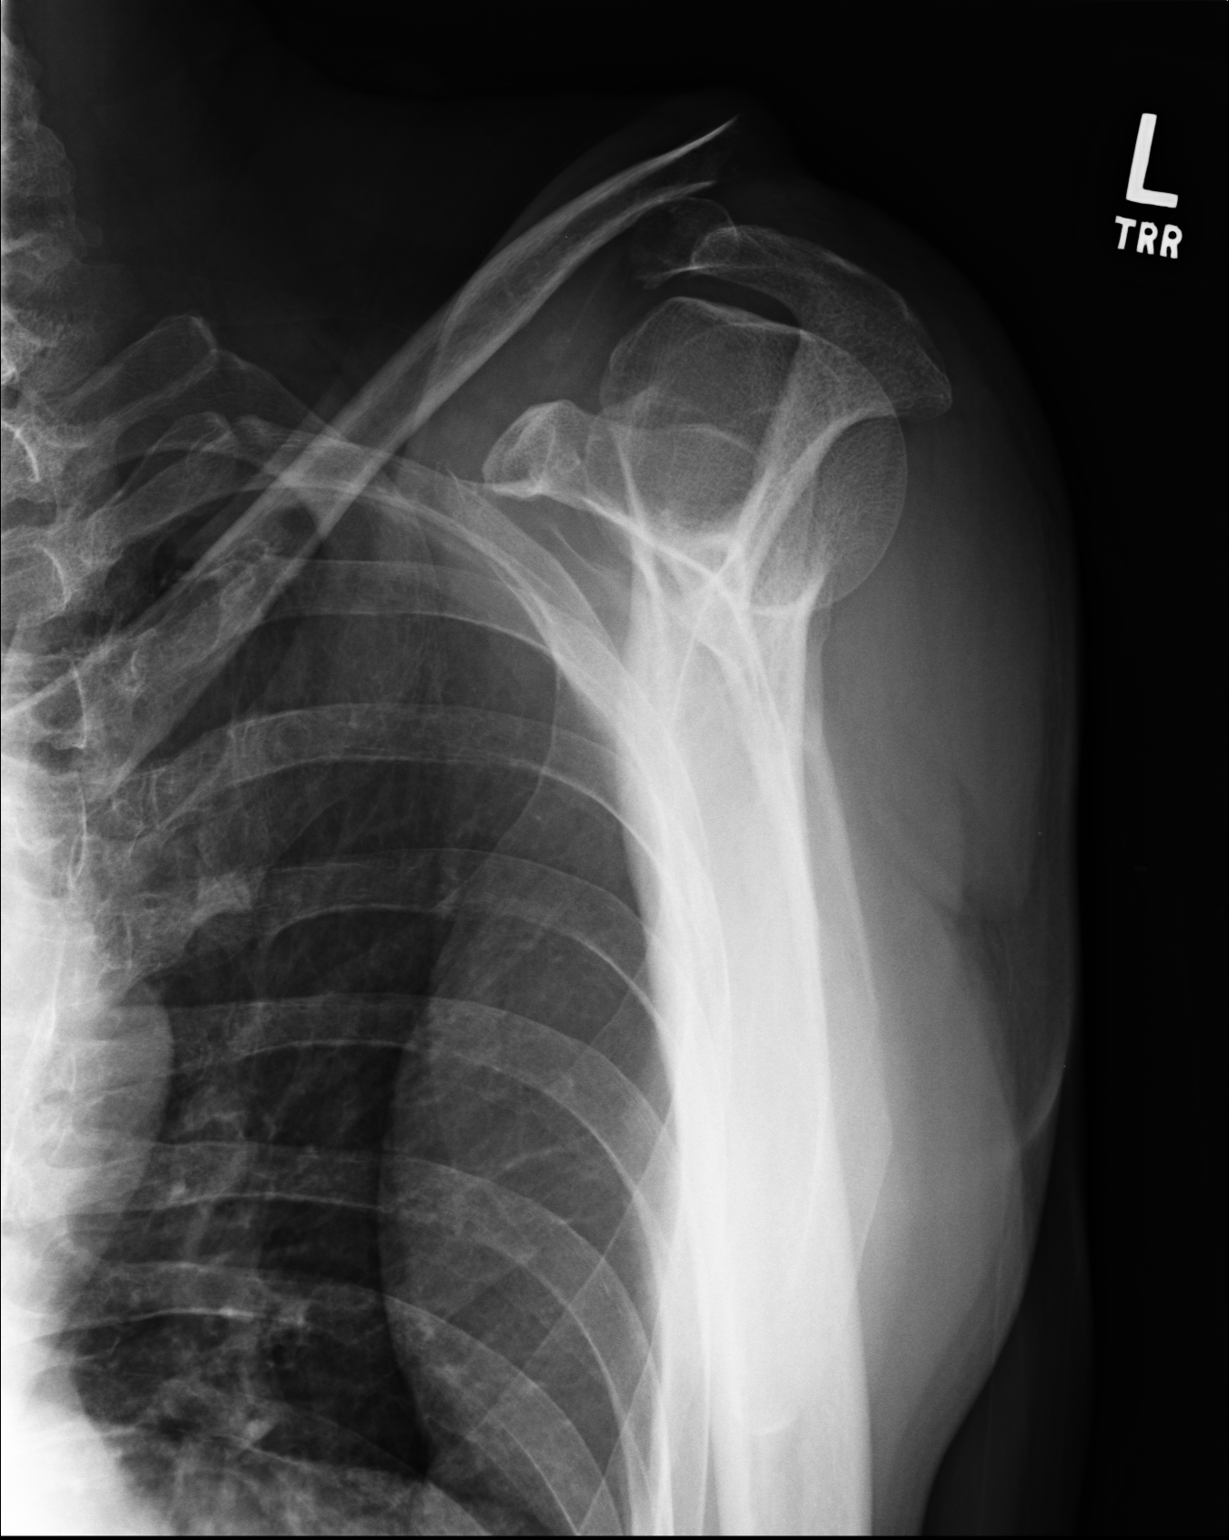

[3 of 3 positions shown; findings below may reference images not displayed]

PROCEDURE:     DXR - DXR SHOULDER LEFT COMPLETE  - [DATE] [DATE]

RESULT:     No fracture or dislocation about the shoulder joint is seen.
There is no dislocation at the shoulder. There is observed a fracture of the
lateral aspect of the left clavicle. The proximal fracture component is
displaced superiorly by approximately 1.1 cm with respect to the lateral
fragment.
IMPRESSION: 1. No fracture or dislocation about the left shoulder is seen.
2. There is a displaced fracture of the lateral aspect of the left clavicle.

## 2010-10-15 IMAGING — CT CT HEAD WITHOUT CONTRAST
2 series · 16 of 30 positions shown, 20 images · non-contrast
Comparison: none

REASON FOR EXAM: head trauma fall + etoh
COMMENTS:   May transport without cardiac monitor

[Series 2: without · axial · non-contrast · 0.42mm/px · z∈[-108,+22]mm · 13 of 32 slices shown, 17 images]
[im 3/32  brain]
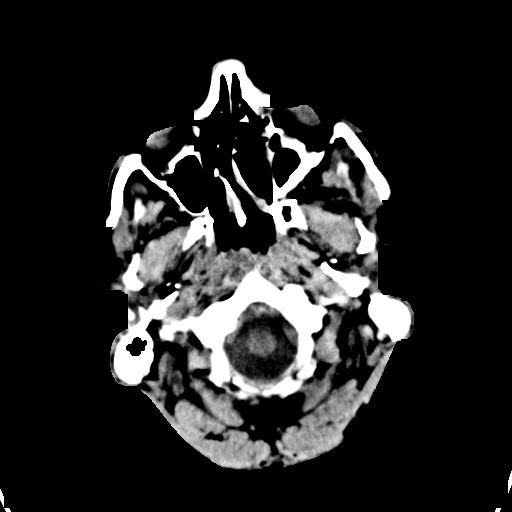
[im 3/32  bone]
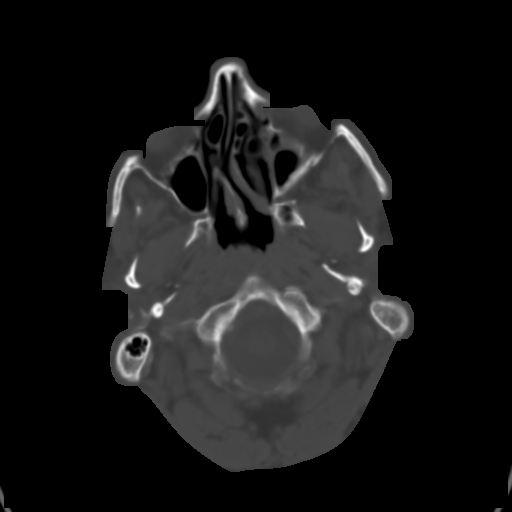
[im 5/32  brain]
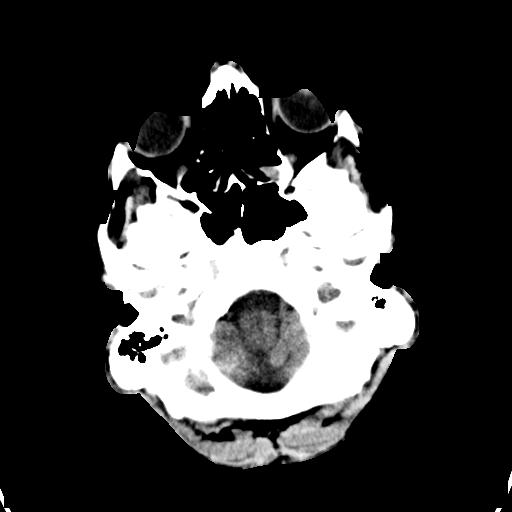
[im 7/32  brain]
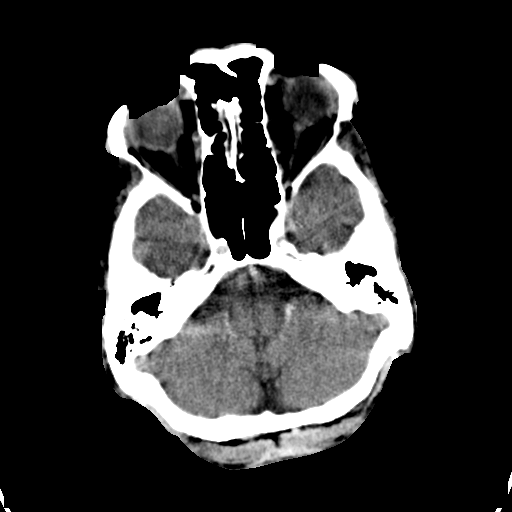
[im 9/32  brain]
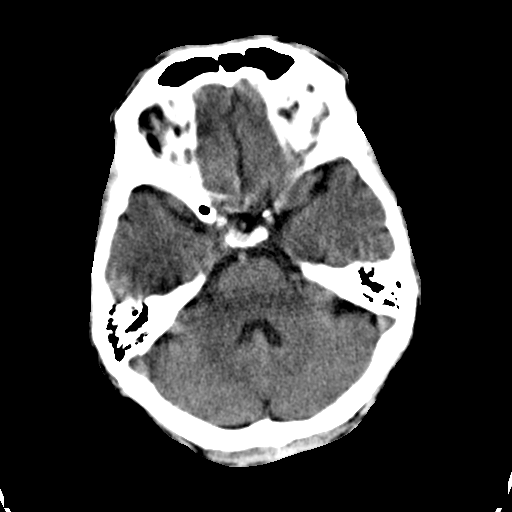
[im 12/32  brain]
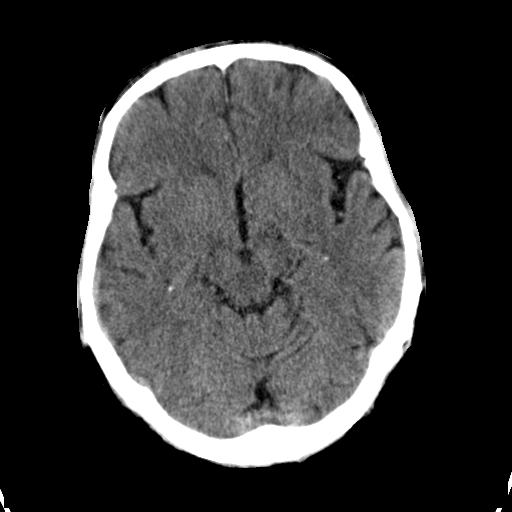
[im 12/32  bone]
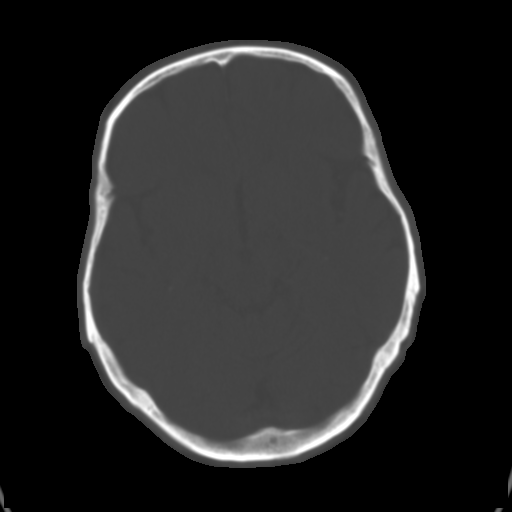
[im 14/32  brain]
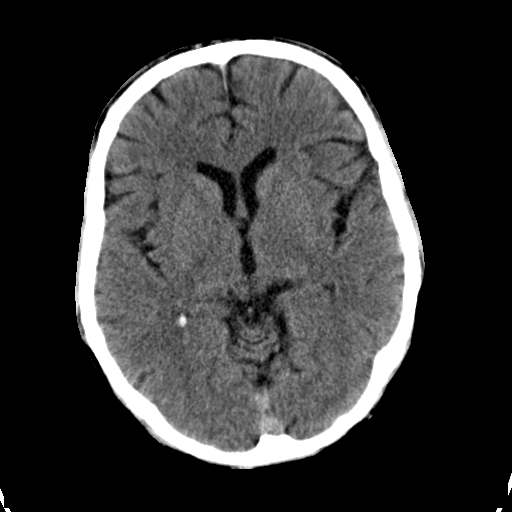
[im 16/32  brain]
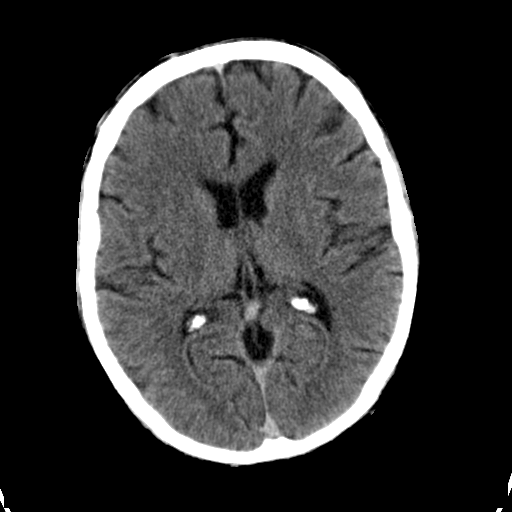
[im 18/32  brain]
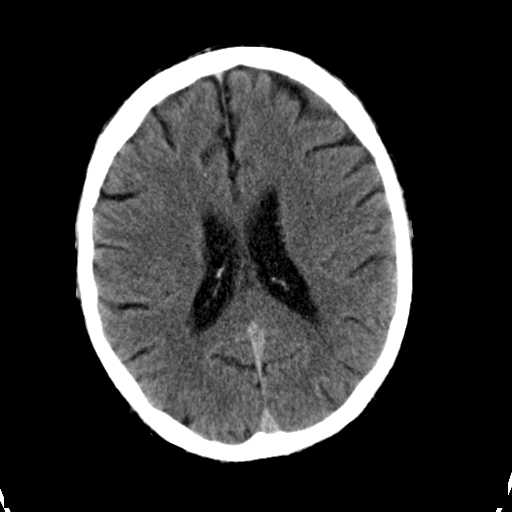
[im 20/32  brain]
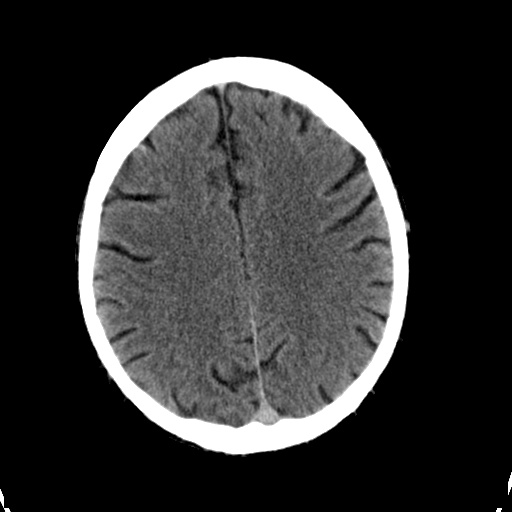
[im 20/32  bone]
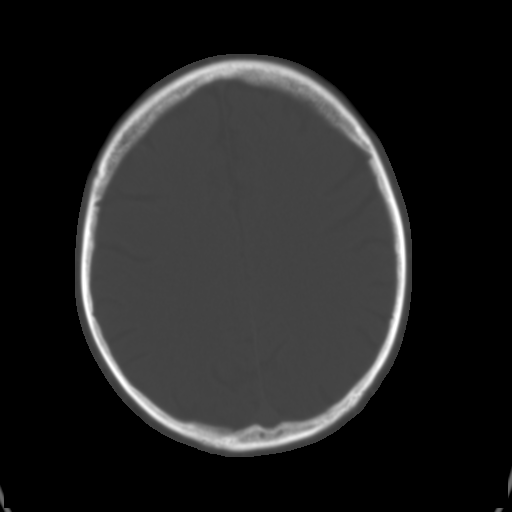
[im 23/32  brain]
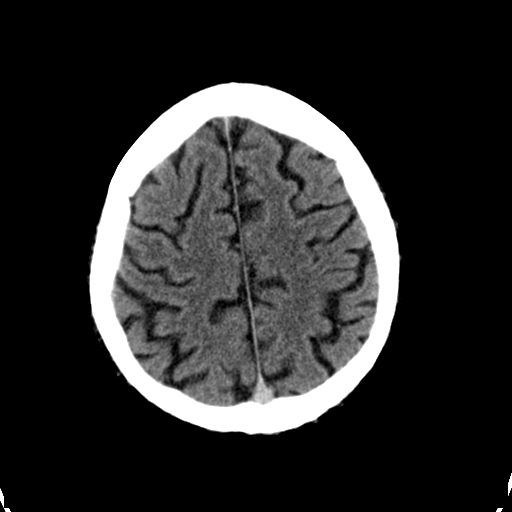
[im 25/32  brain]
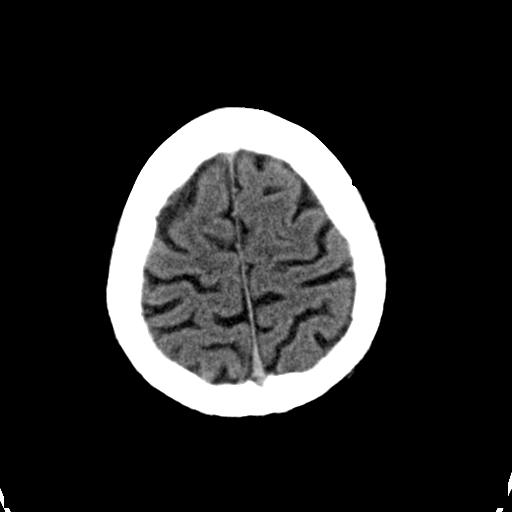
[im 27/32  brain]
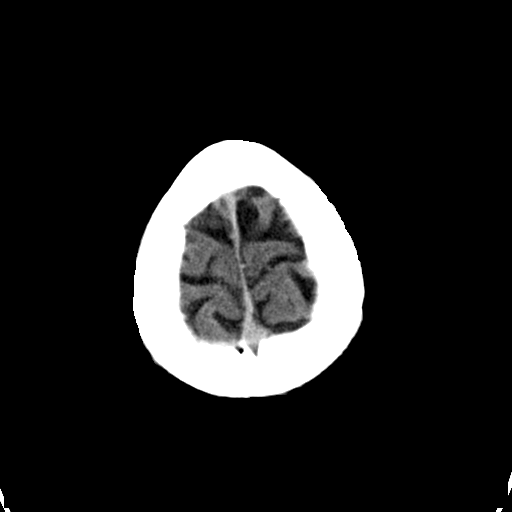
[im 29/32  brain]
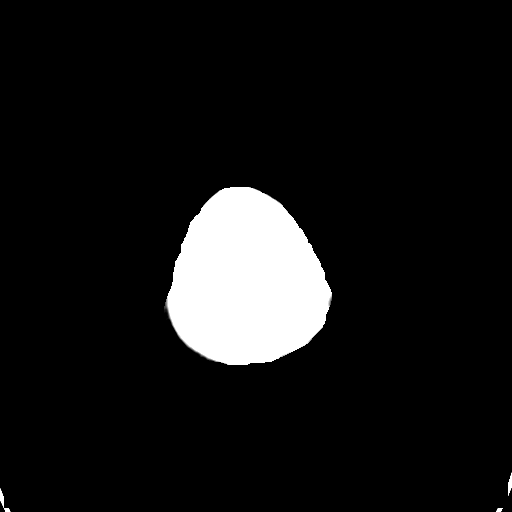
[im 29/32  bone]
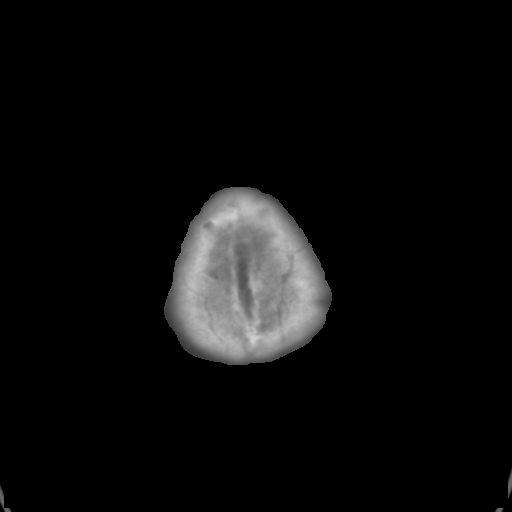

[Series 3: bone · axial · 0.42mm/px · z∈[-108,-62]mm · 3 of 32 slices shown]
[im 3/32  bone]
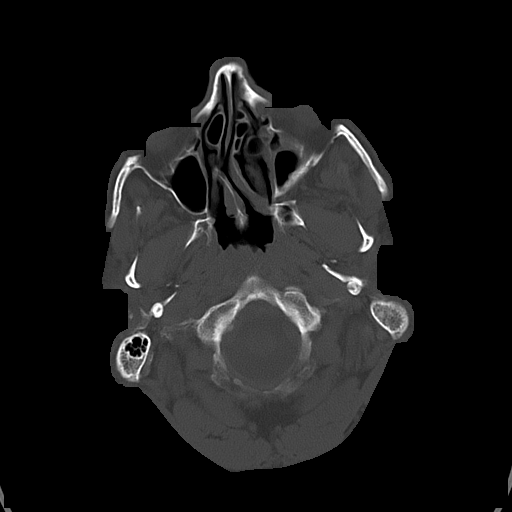
[im 7/32  bone]
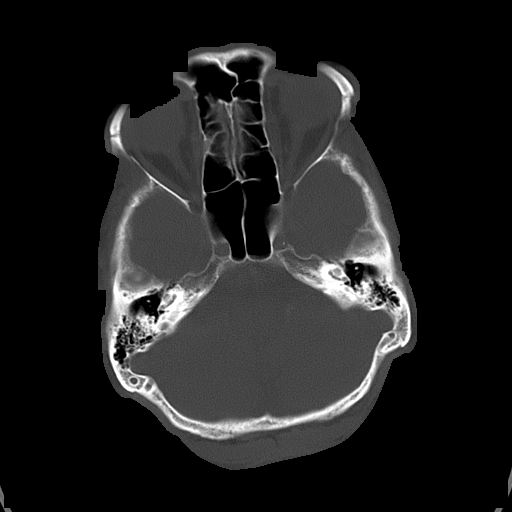
[im 12/32  bone]
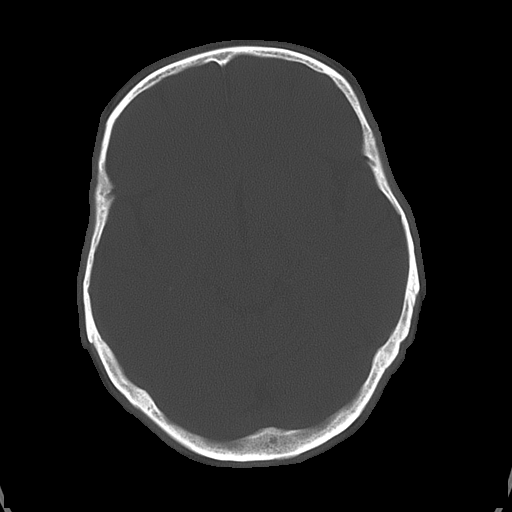

[16 of 30 positions shown; findings below may reference images not displayed]

PROCEDURE:     CT  - CT HEAD WITHOUT CONTRAST  - [DATE] [DATE]

RESULT:     Noncontrast emergent CT of the brain is compared to the previous
exam of [DATE].

The ventricles and sulci appear to be within normal limits. There is no
evidence of intracranial hemorrhage, mass or mass effect. There is no
midline shift or territorial infarct. Mucosal thickening is seen within the
ethmoid air cells. The sinuses otherwise appear clear. The mastoids are
within normal limits. The calvarium appears intact.
IMPRESSION: No CT evidence of an acute intracranial abnormality.

## 2010-11-22 ENCOUNTER — Emergency Department: Payer: Self-pay | Admitting: Emergency Medicine

## 2011-02-11 ENCOUNTER — Inpatient Hospital Stay: Payer: Self-pay | Admitting: Psychiatry

## 2011-03-02 ENCOUNTER — Ambulatory Visit: Payer: Self-pay

## 2011-03-02 IMAGING — CR RIGHT ANKLE - 2 VIEW
1 series · 4 of 4 positions shown · non-contrast
Comparison: none

REASON FOR EXAM: broken collar bone, left arm leg  scoliosis  hernia
COMMENTS:

[Series 1: view not recorded · 0.17mm/px · 4 of 4 slices shown]
[im 1/4]
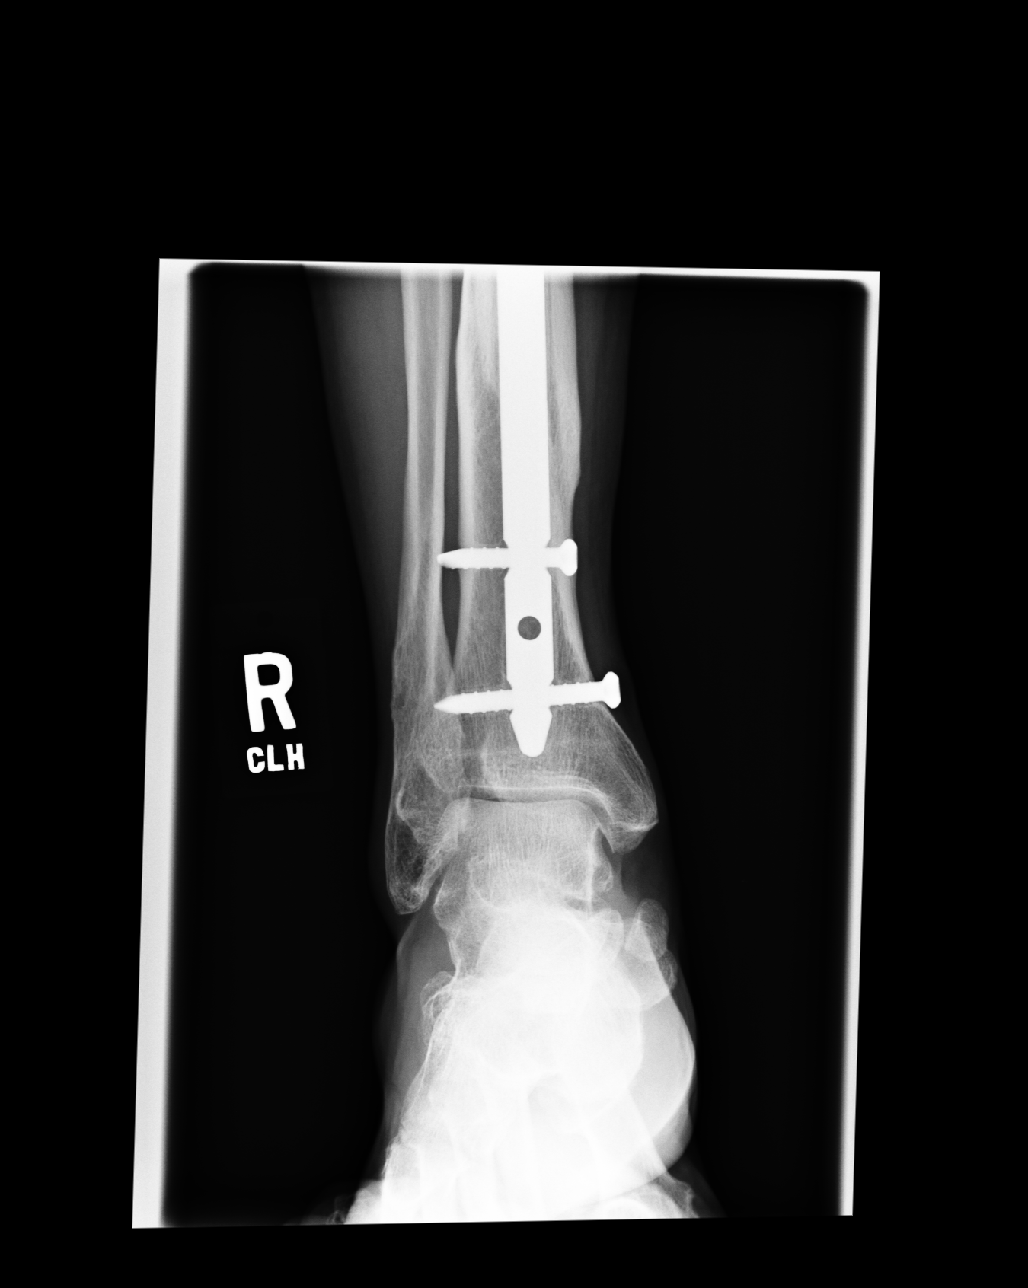
[im 2/4]
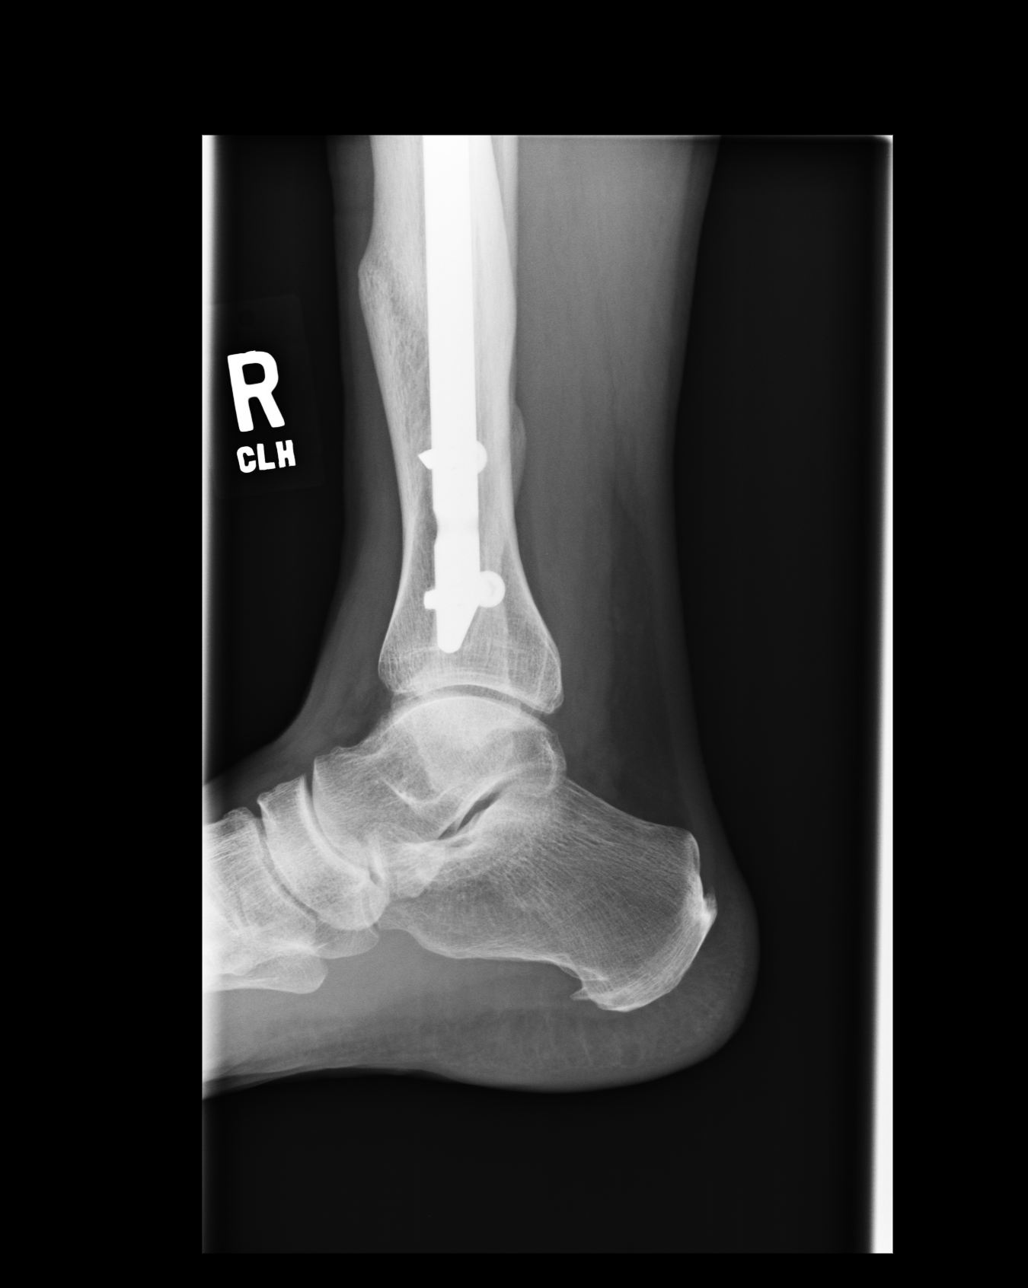
[im 3/4]
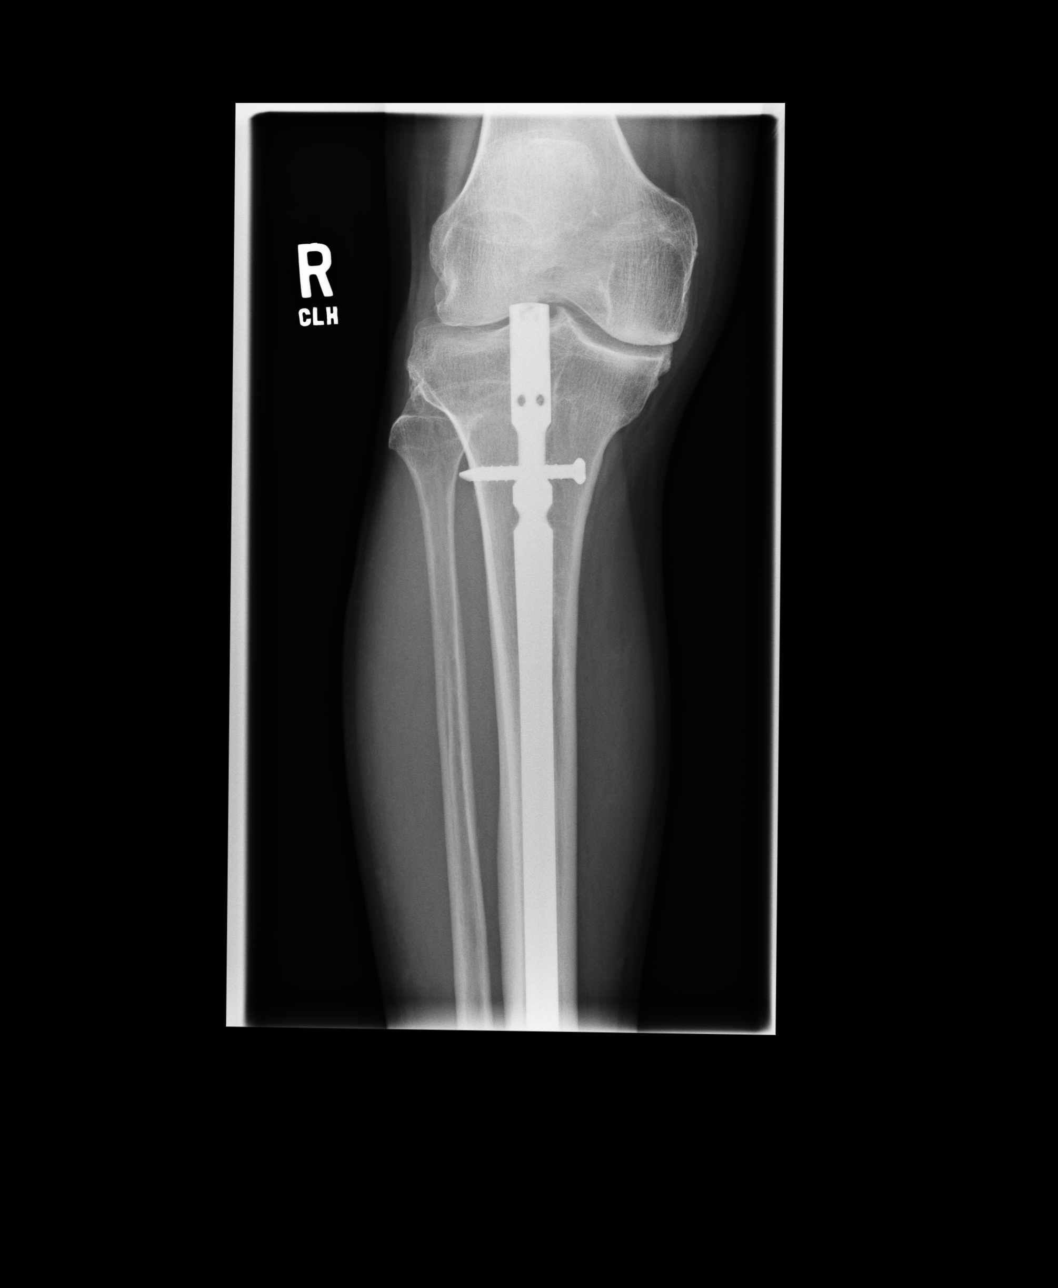
[im 4/4]
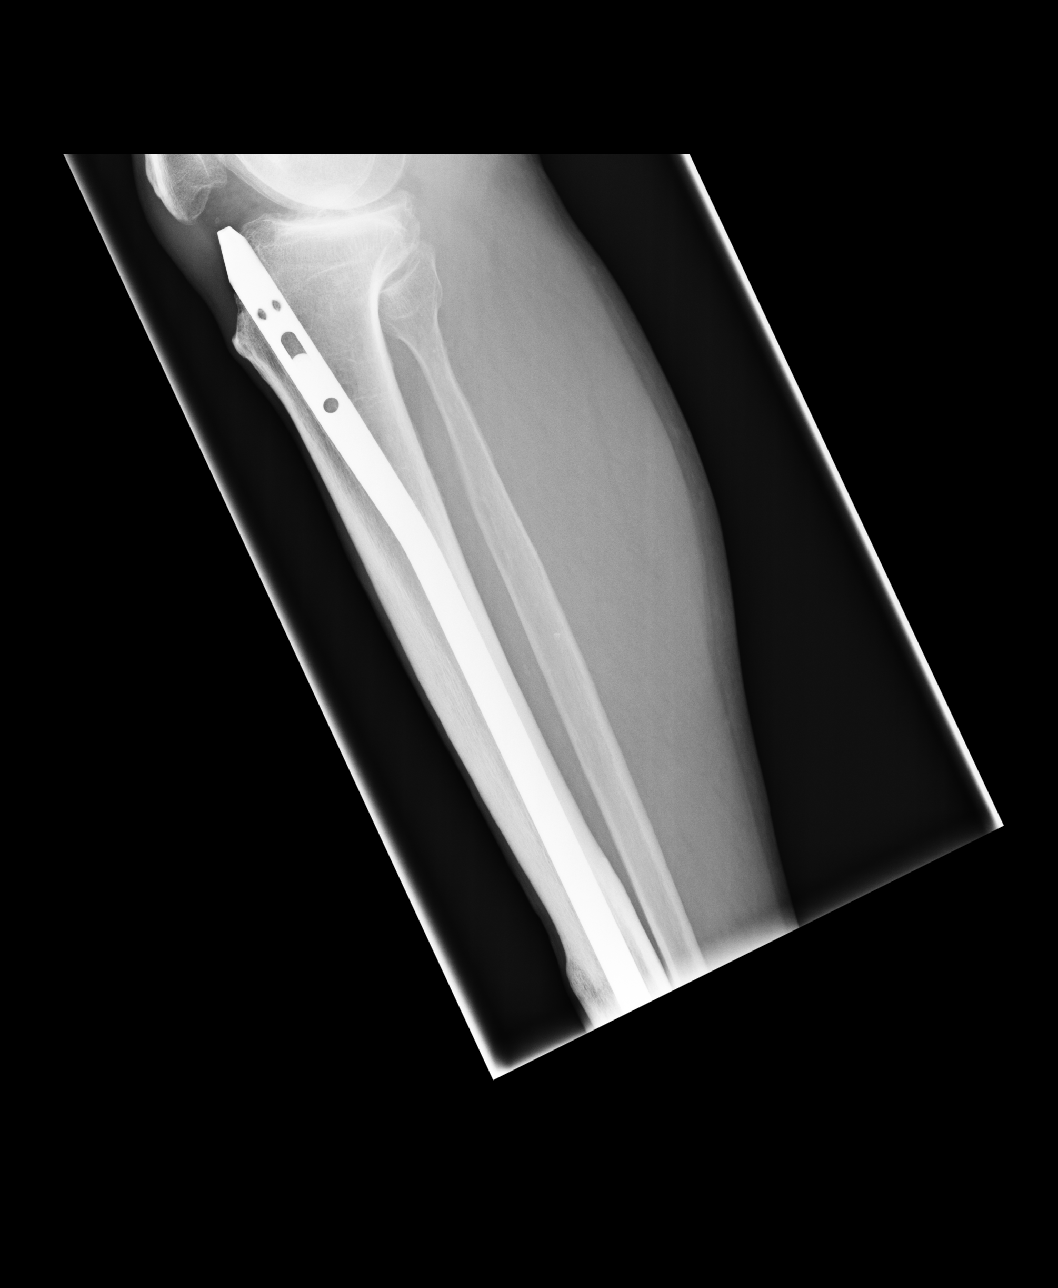

[4 of 4 positions shown; findings below may reference images not displayed]

PROCEDURE:     DXR - DXR ANKLE RIGHT AP AND LATERAL  - [DATE] [DATE]

RESULT:     There is an intramedullary rod observed in the right tibia. The
rod is transfixed by screws proximally and distally. The hardware is intact.
The fracture of the distal tibia that was acute on the exam of [DATE] has
healed. Likewise, the comminuted fracture of the distal fibula previously
present has also healed. No acute fracture is seen. No lytic or blastic
lesions are noted. The ankle mortise is well-maintained. In the lateral view
there is incidentally noted a plantar calcaneal spur.
IMPRESSION: 1. Residual changes from prior tibial and fibular fractures are noted. The
fractures are healed. A tibial intramedullary rod remains present and is
intact.
2. No new fractures are seen.
3. There is a small plantar calcaneal spur.

## 2011-03-02 IMAGING — CR DG SHOULDER 1V*L*
1 series · 1 of 1 positions shown · non-contrast
Comparison: none

REASON FOR EXAM: broken collar bone, left arm leg  scoliosis  hernia
COMMENTS:

[view not recorded]
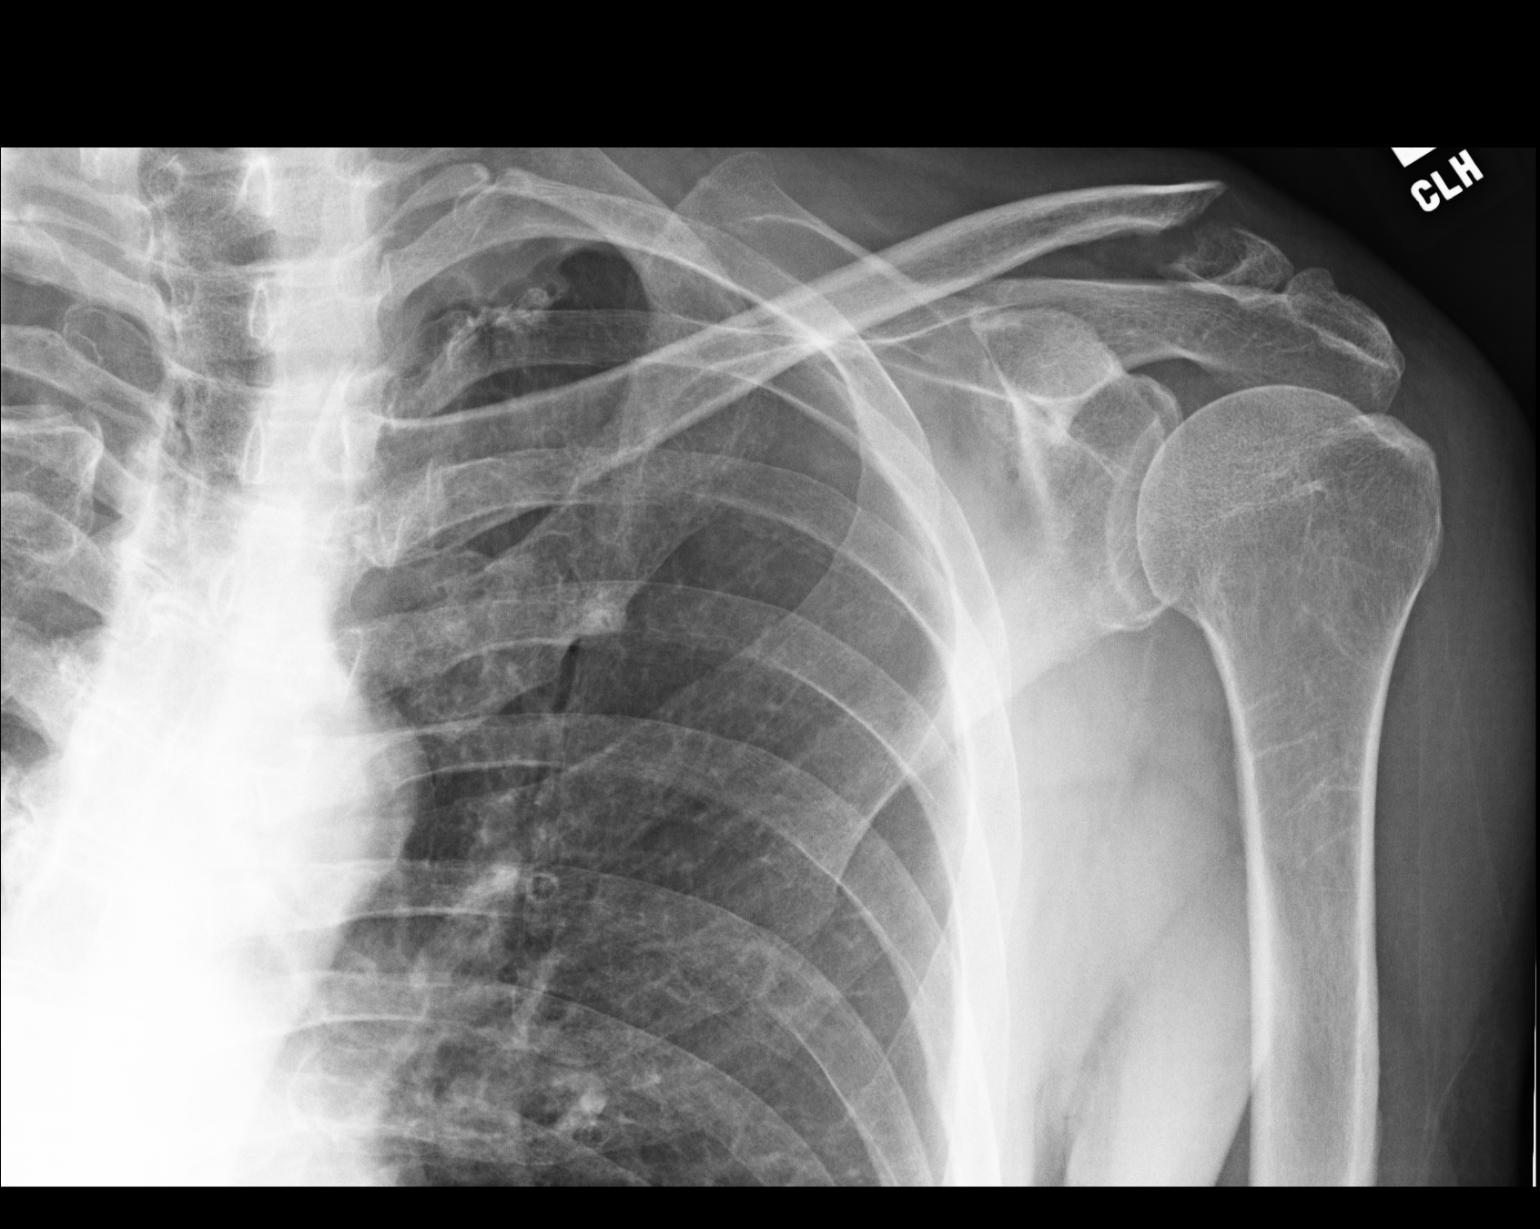

[1 of 1 positions shown; findings below may reference images not displayed]

PROCEDURE:     DXR - DXR SHOULDER LEFT ONE VIEW  - [DATE] [DATE]

RESULT:     An AP view of the left shoulder is compared to a prior exam of
[DATE].

There is again noted a fracture of the lateral aspect of the left clavicle.
The fracture line remains clearly visible compatible with fibrous union.
Re-injury of the fibrous union cannot be excluded on the basis of this exam.
No additional fractures of the clavicle are seen. No fracture or dislocation
at the shoulder joint is observed.
IMPRESSION: There is an ununited fracture of the lateral aspect of the
left clavicle as noted above.

## 2011-03-23 ENCOUNTER — Ambulatory Visit: Payer: Self-pay | Admitting: Ophthalmology

## 2011-05-14 ENCOUNTER — Ambulatory Visit: Payer: Self-pay

## 2011-05-14 IMAGING — CR DG LUMBAR SPINE COMPLETE 4+V
1 series · 5 of 5 positions shown · non-contrast
Comparison: none

REASON FOR EXAM: lumbago scoliosis
COMMENTS:

[Series 1: view not recorded · 0.17mm/px · 5 of 5 slices shown]
[im 1/5]
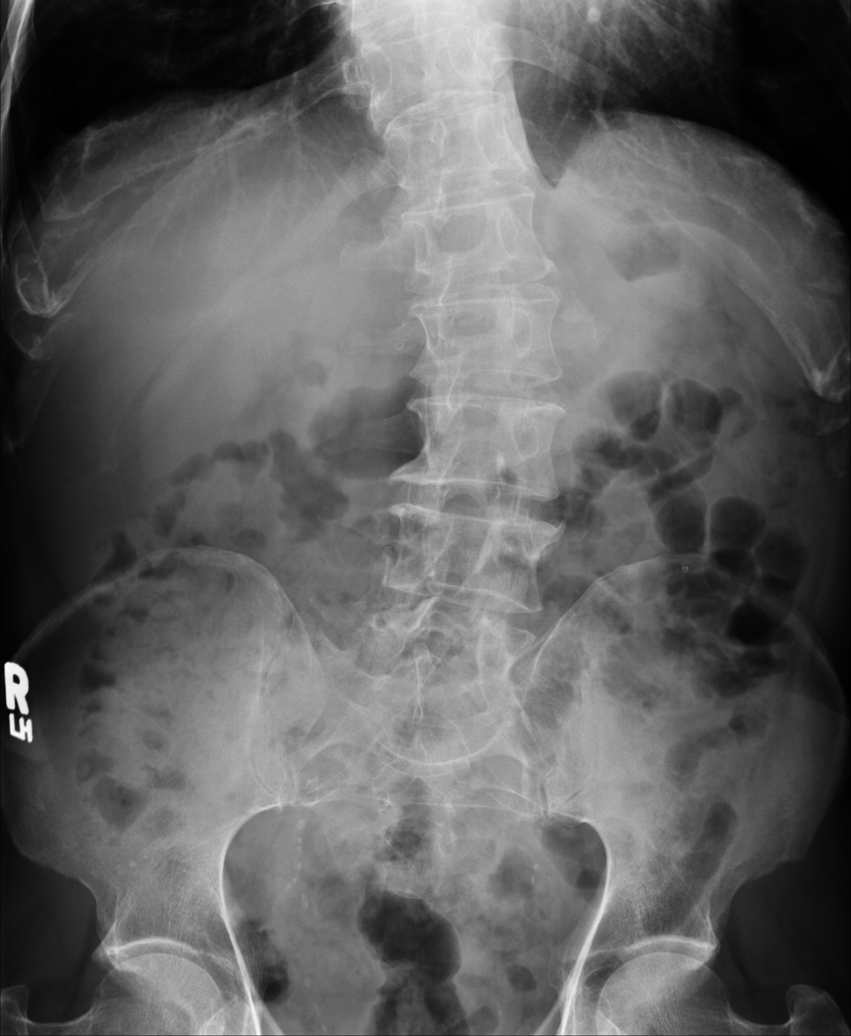
[im 2/5]
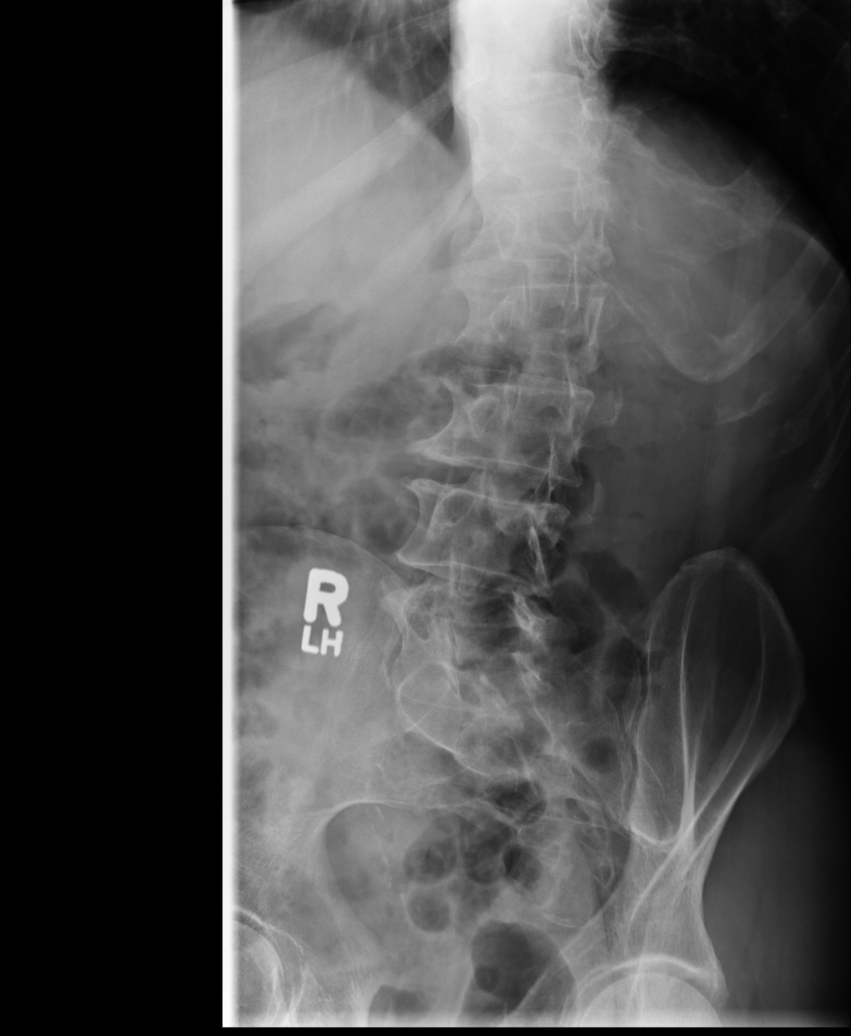
[im 3/5]
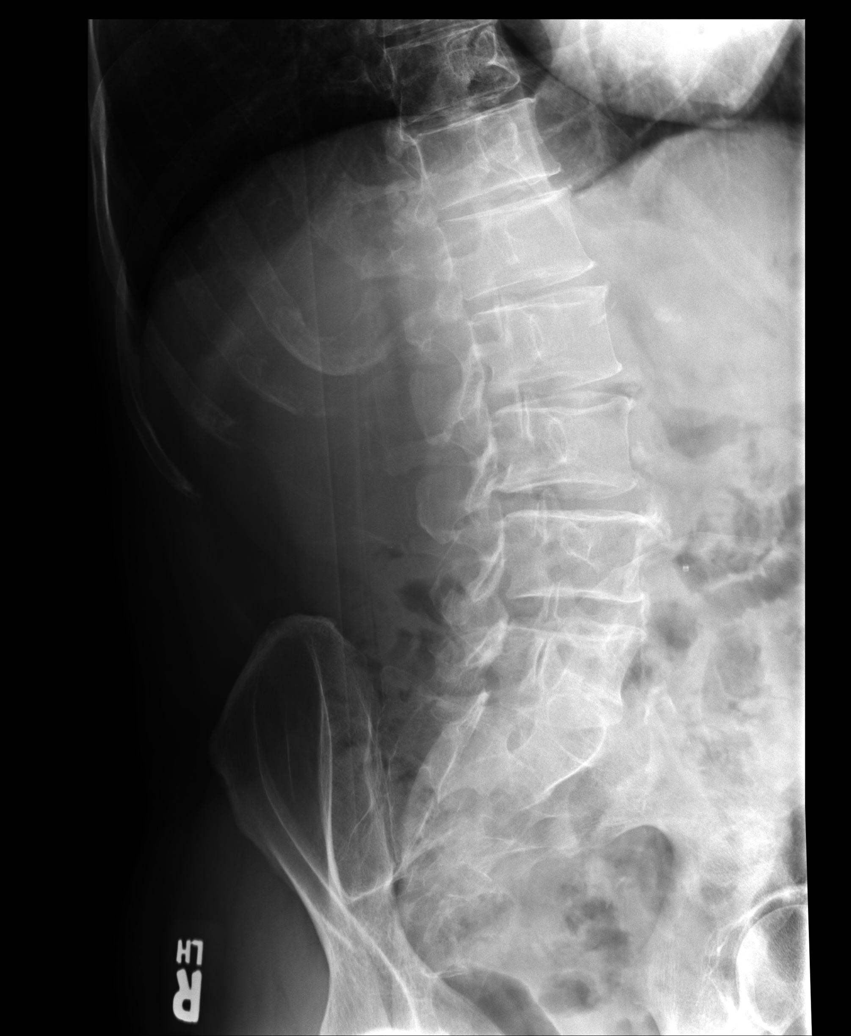
[im 4/5]
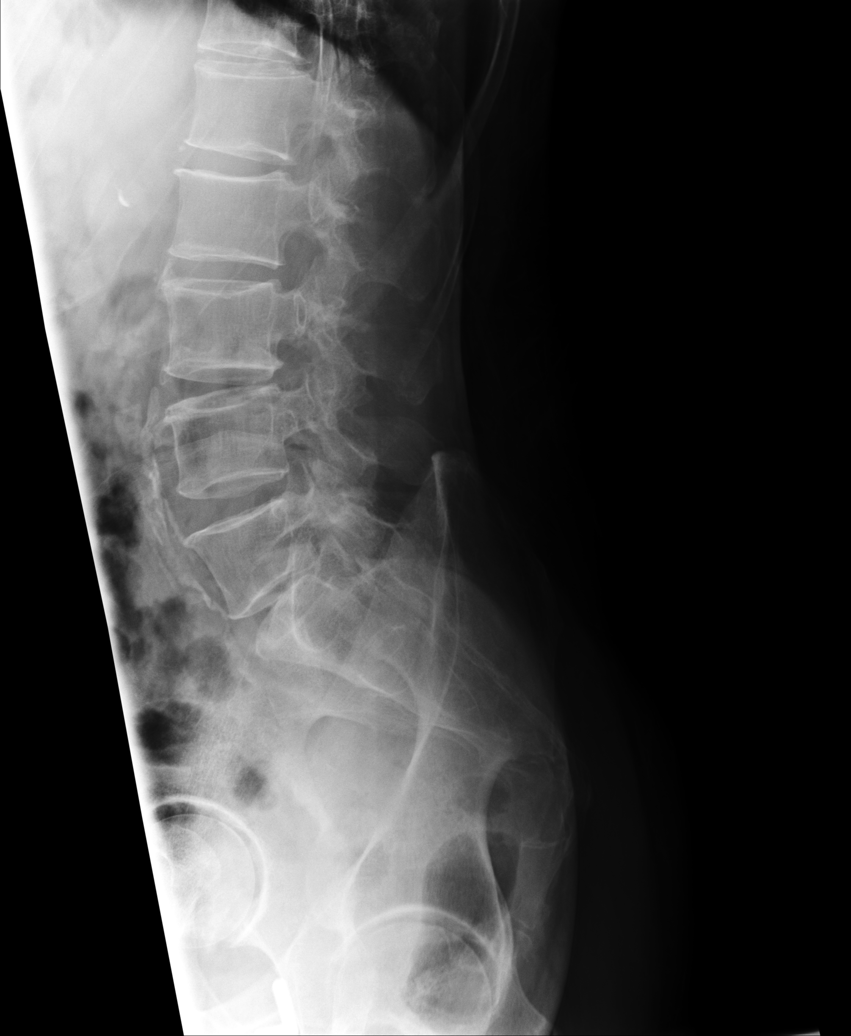
[im 5/5]
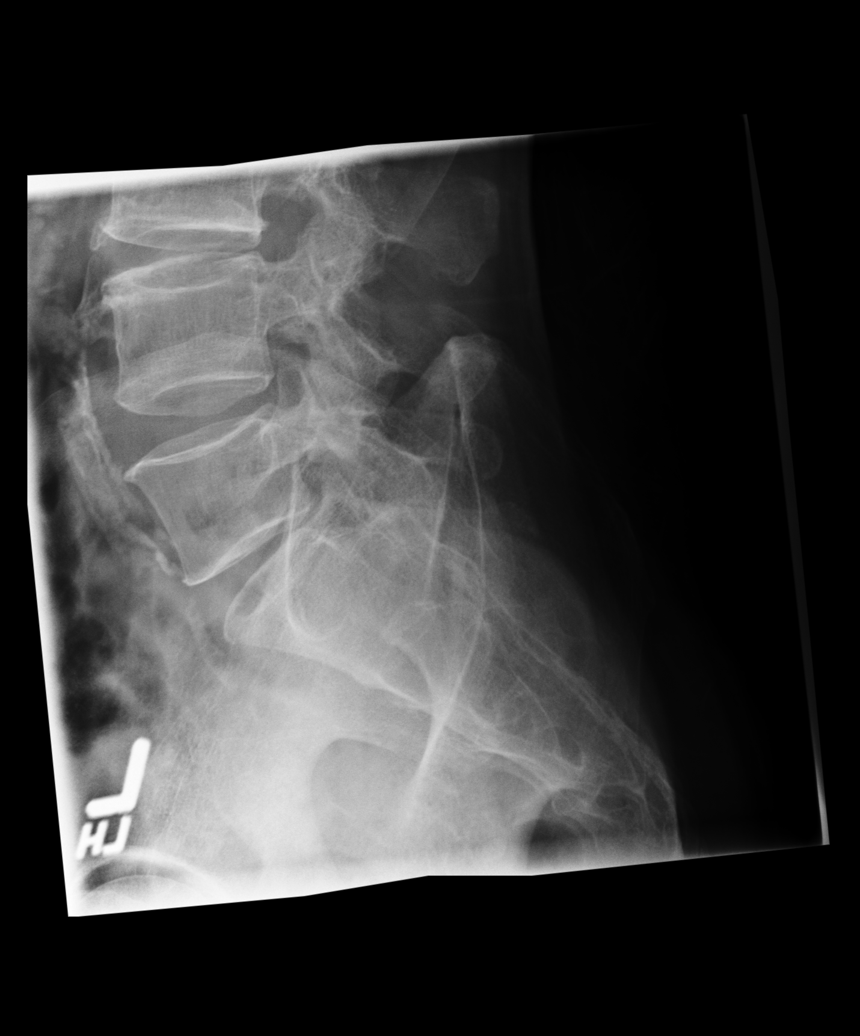

[5 of 5 positions shown; findings below may reference images not displayed]

PROCEDURE:     KDR - KDXR LUMBAR SPINE WITH OBLIQUES  - [DATE] [DATE]

RESULT:     Comparison is made to a prior exam of [DATE].

The vertebral body heights are well maintained. There is slight narrowing of
the L5-S1 intervertebral disc space where there is also noted approximately
2 to 3 mm retrolisthesis of L5 on S1. This would appear to be on a
degenerative basis since no associated fracture is seen. The intervertebral
disc spaces otherwise are well maintained. The pedicles are bilaterally
intact. No lytic or blastic lesions are observed. In the AP view, there is
again observed a moderate lumbar scoliosis with a convexity to the left and
measuring approximately 20 degrees.
IMPRESSION: 1.  No fracture is seen.
2.  There is slight narrowing of the L5-S1 intervertebral disc space where
there is also observed a slight spondylolisthesis without spondylolysis.
3.  A lumbar scoliosis is again observed and appears stable as compared to
the prior exam of [DATE].

## 2011-10-22 ENCOUNTER — Ambulatory Visit: Payer: Self-pay

## 2011-10-22 IMAGING — US US ABDOMEN LIMITED SLG ORGAN/ASCITES
1 series · 17 of 25 positions shown · non-contrast
Comparison: none

REASON FOR EXAM: concentration of liver  Rt  leg pain coldness
COMMENTS:

[Series 1: us abdomen limited slg organ/ascites · 17 of 64 slices shown]
[im 1/64]
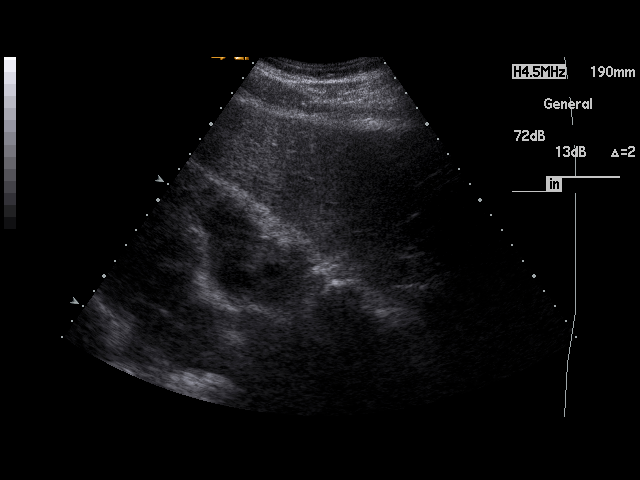
[im 6/64]
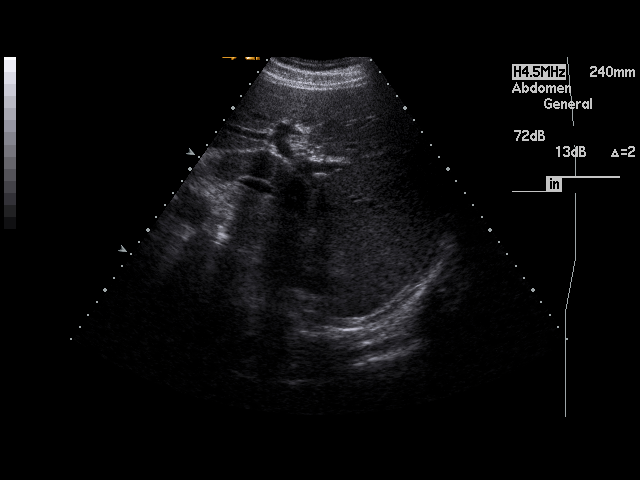
[im 8/64]
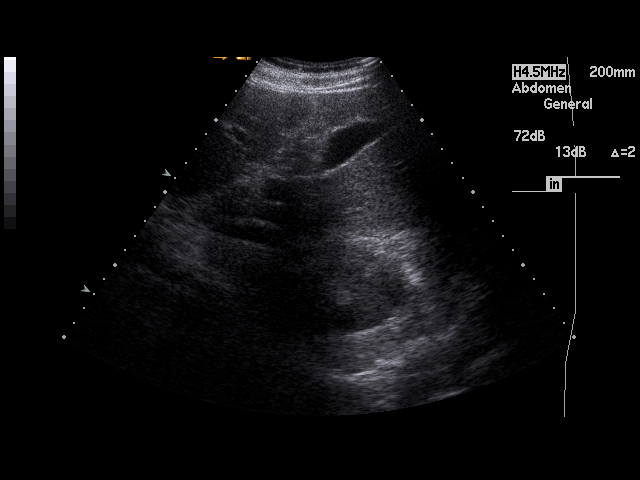
[im 14/64]
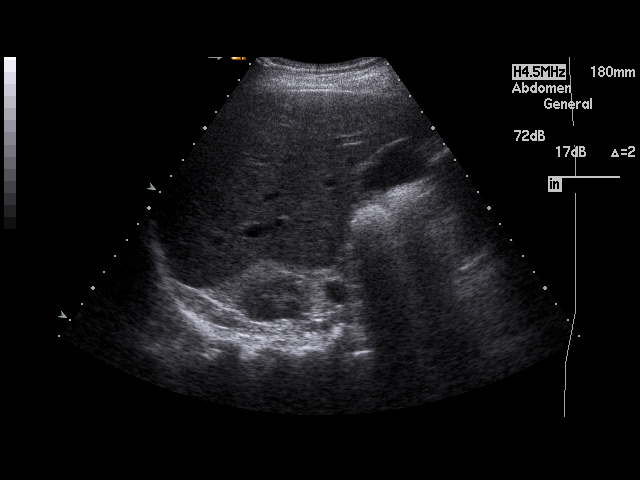
[im 16/64]
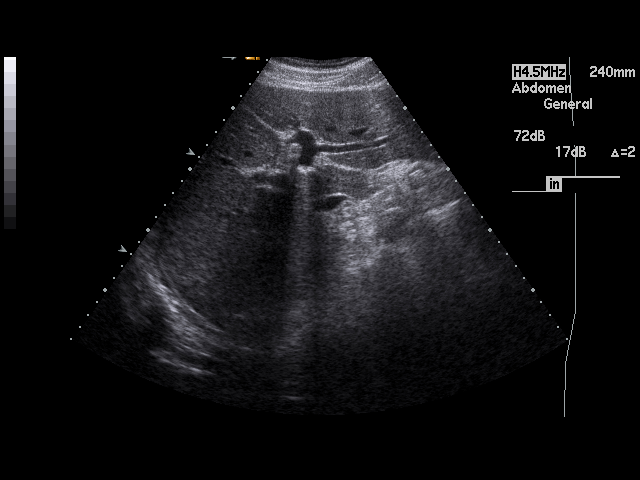
[im 22/64]
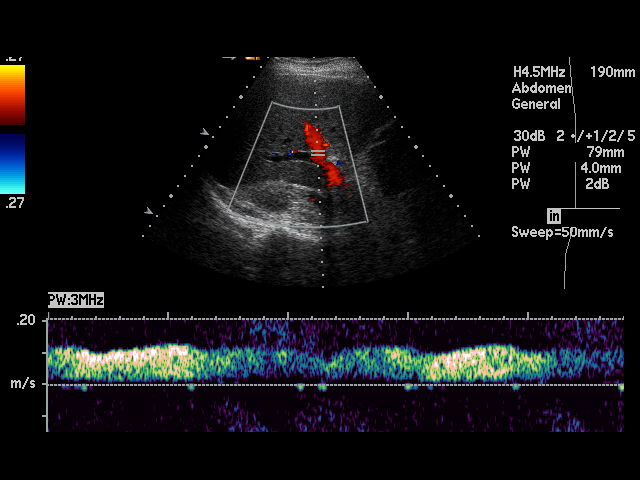
[im 24/64]
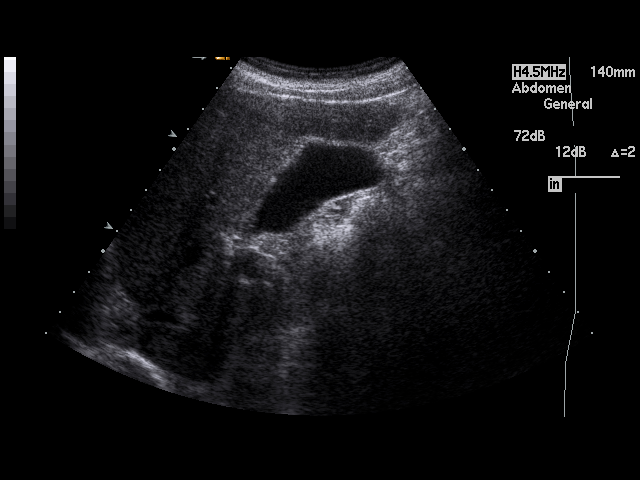
[im 29/64]
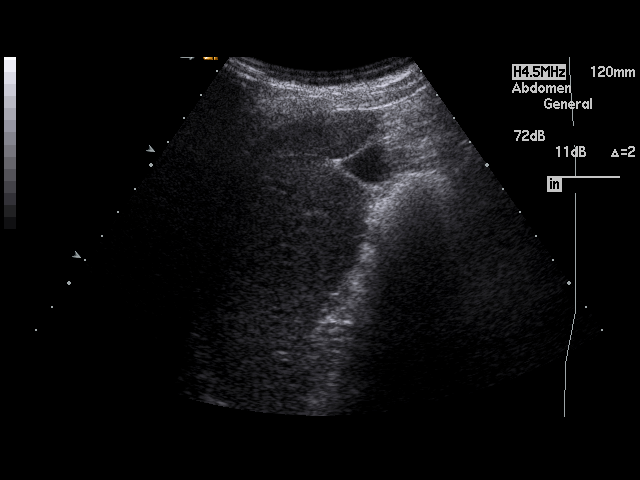
[im 32/64]
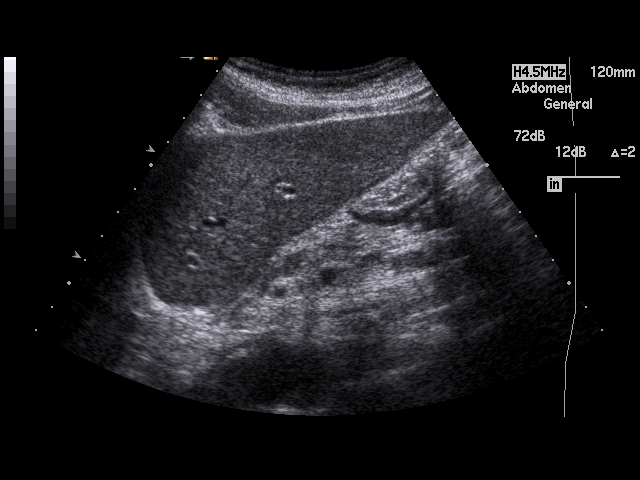
[im 35/64]
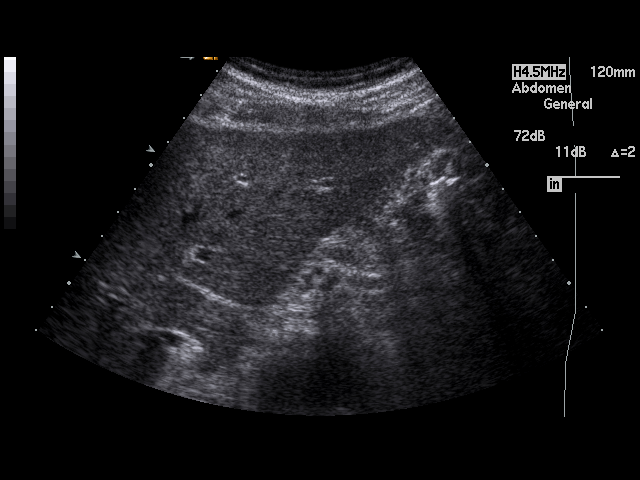
[im 40/64]
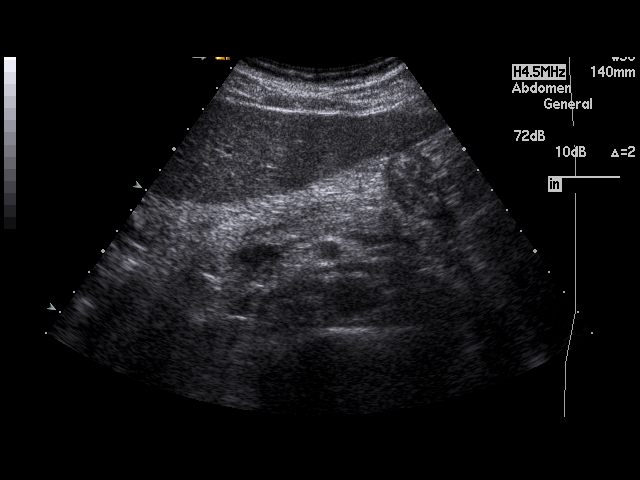
[im 43/64]
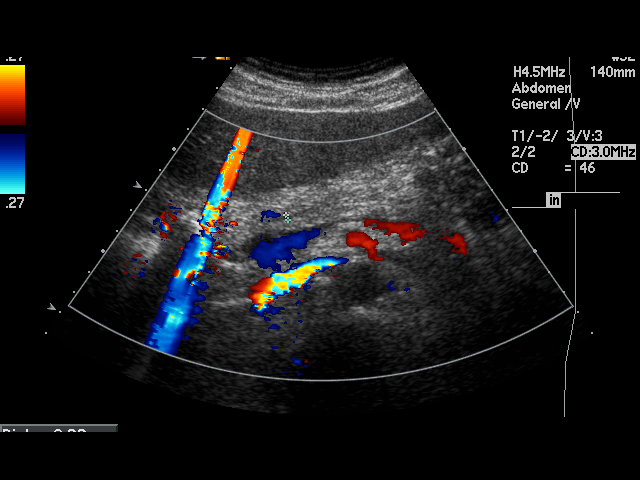
[im 48/64]
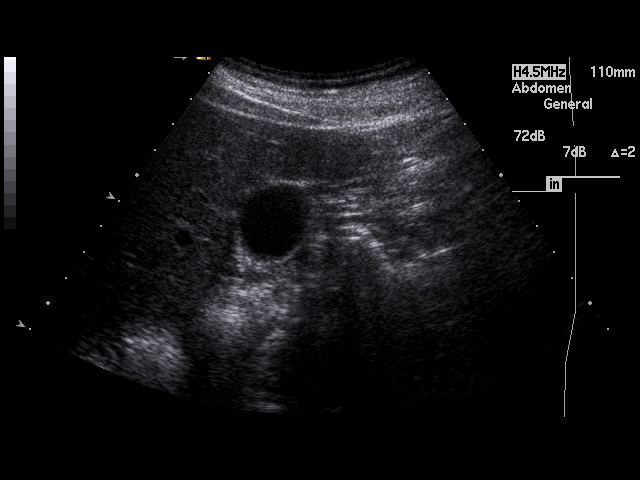
[im 50/64]
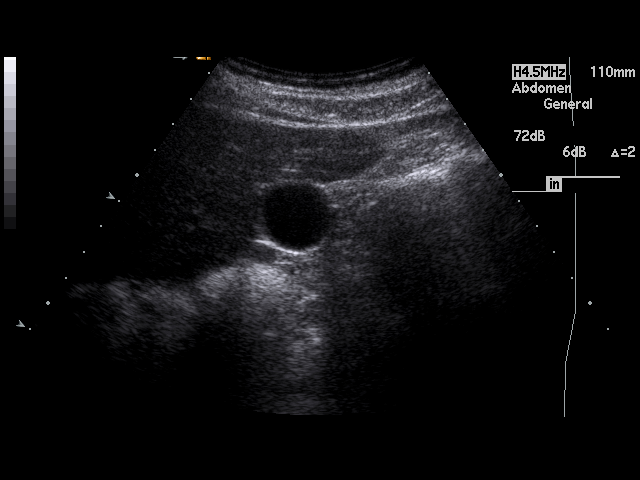
[im 56/64]
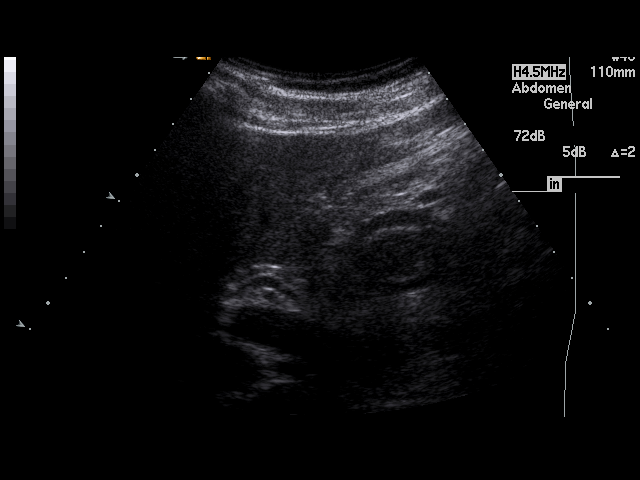
[im 58/64]
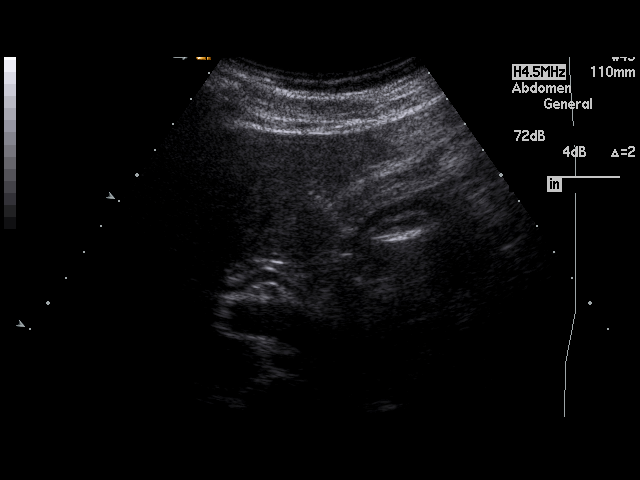
[im 64/64]
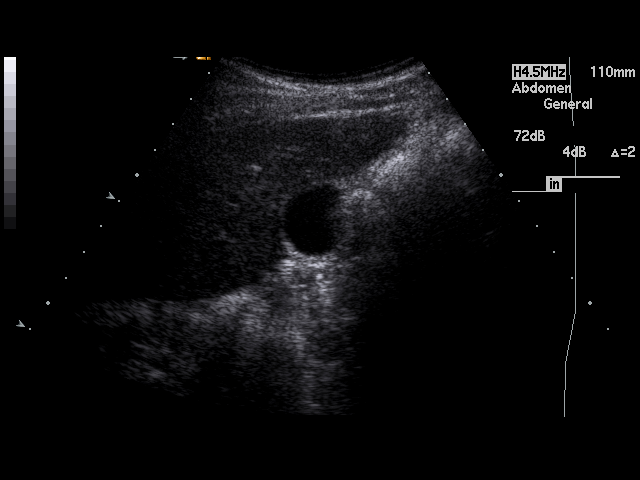

[17 of 25 positions shown; findings below may reference images not displayed]

PROCEDURE:     TI - TI ABDOMEN LTD 1 ORGAN OR QUAD  - [DATE] [DATE]

RESULT:     Limited examination for evaluation of the right upper quadrant
shows the hepatic echo pattern to be normal in appearance. No focal hepatic
mass is seen. The pancreatic head is normal in appearance. No gallstones are
seen. There is no thickening of the gallbladder wall. Common bile duct
measures 3.2 mm in diameter which is within normal limits. No ascites about
the liver is seen.
IMPRESSION: No significant abnormalities are noted.

## 2011-10-22 IMAGING — US US EXTREM LOW VENOUS*R*
1 series · 17 of 24 positions shown · non-contrast
Comparison: none

REASON FOR EXAM: concentration of liver  Rt  leg pain coldness
COMMENTS:

[Series 1: us extrem low venous*right* · 17 of 32 slices shown]
[im 1/32]
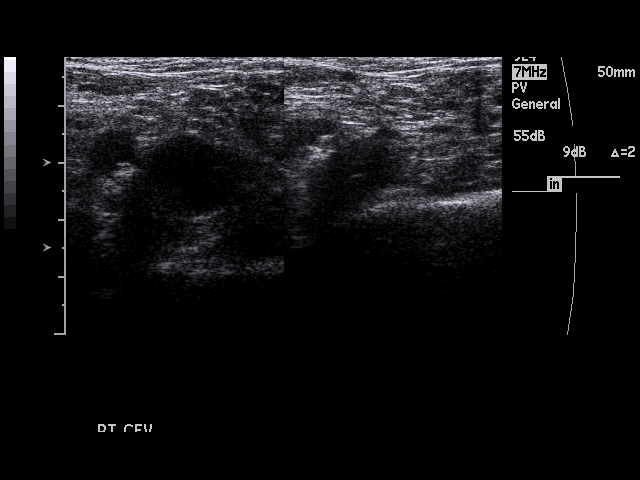
[im 3/32]
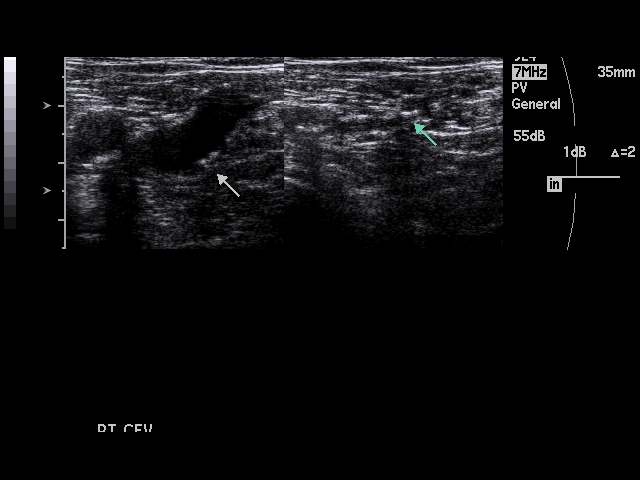
[im 5/32]
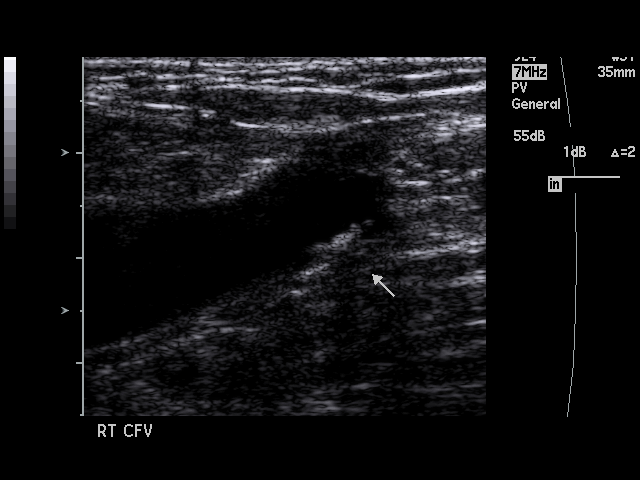
[im 6/32]
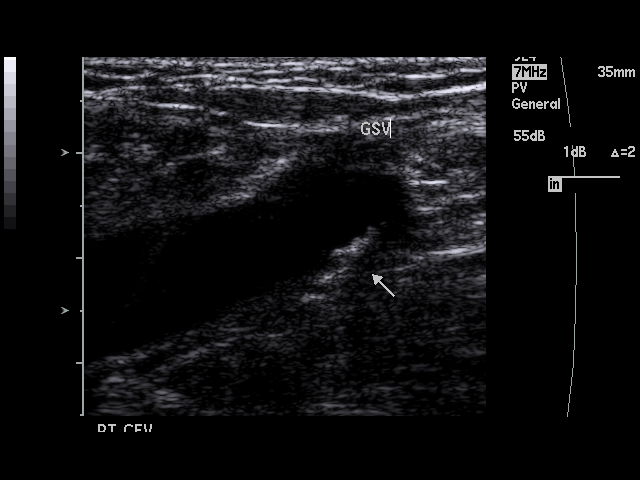
[im 9/32]
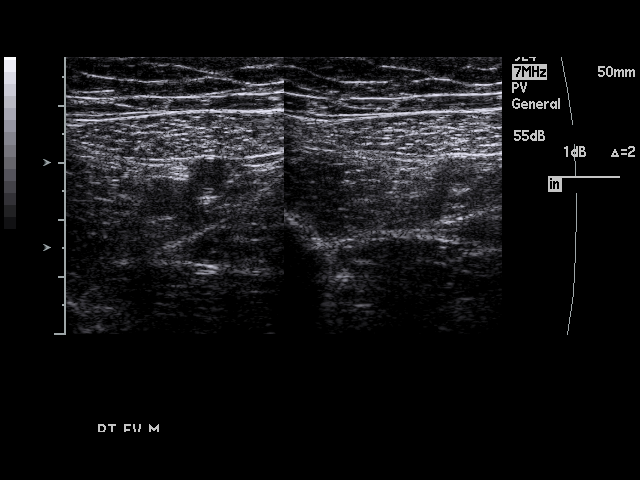
[im 10/32]
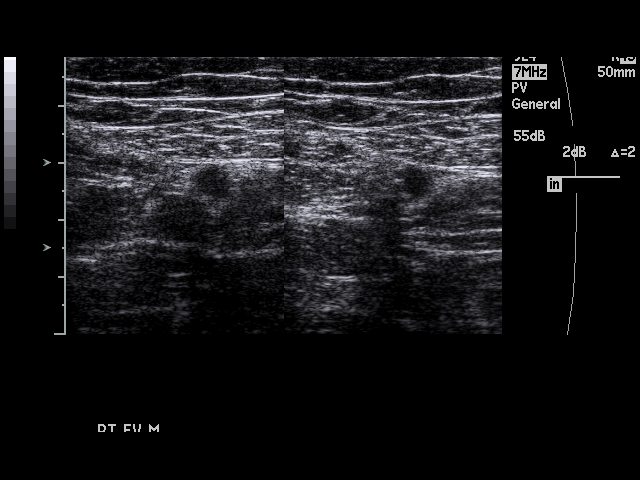
[im 13/32]
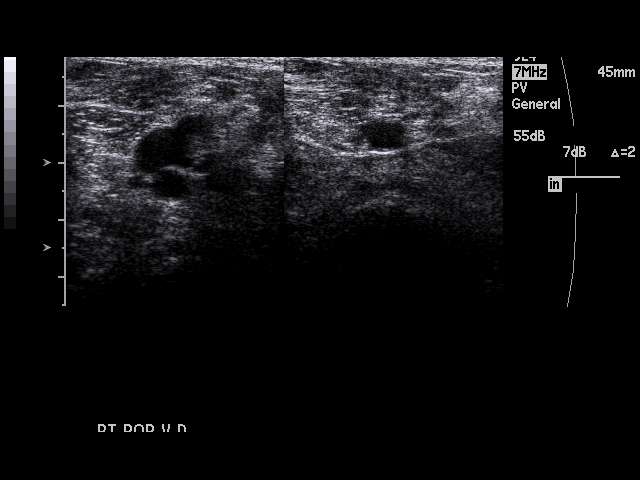
[im 14/32]
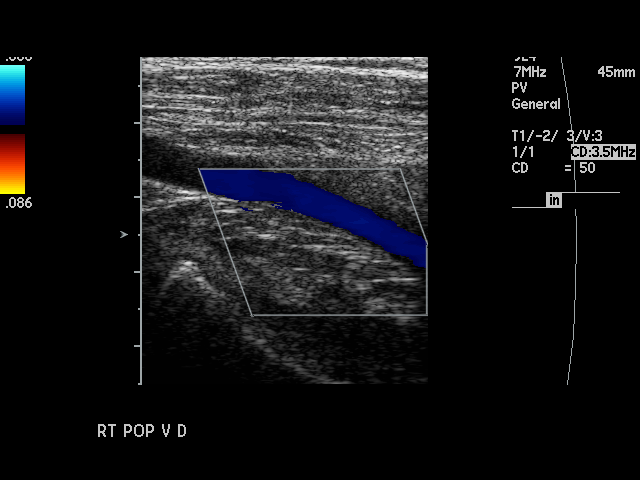
[im 17/32]
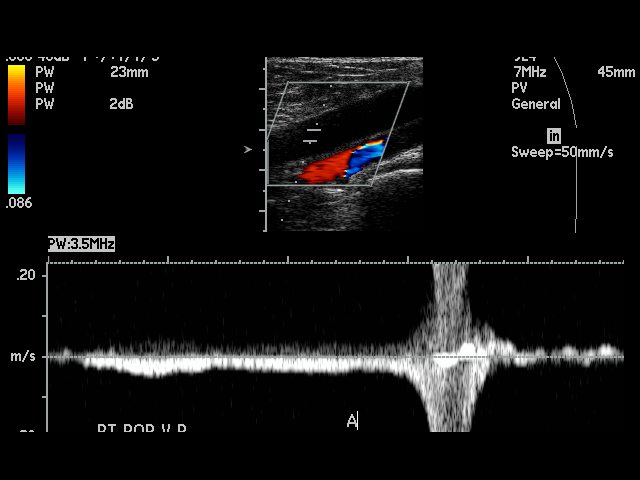
[im 18/32]
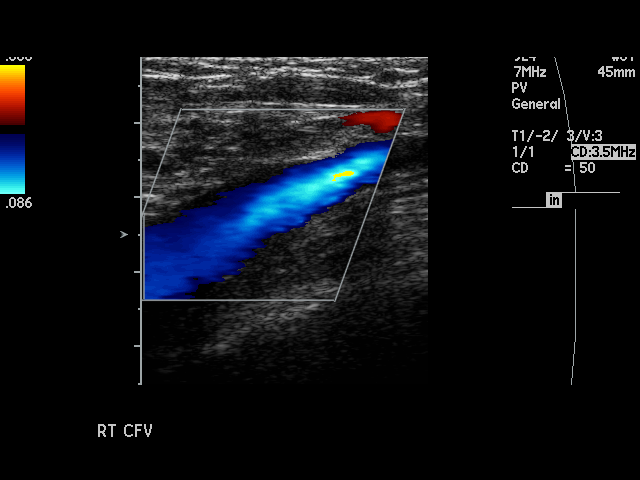
[im 19/32]
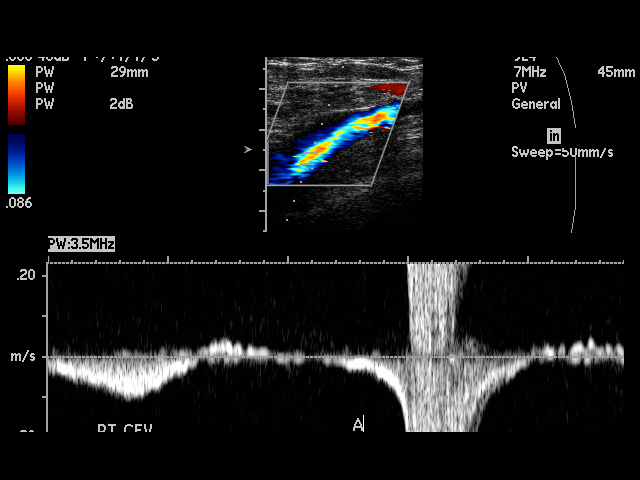
[im 22/32]
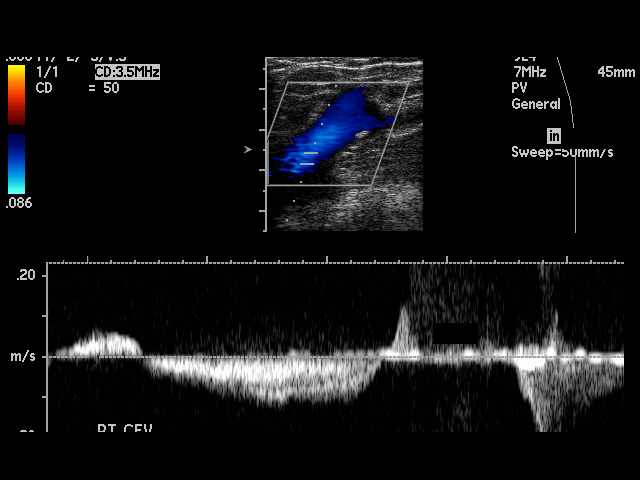
[im 23/32]
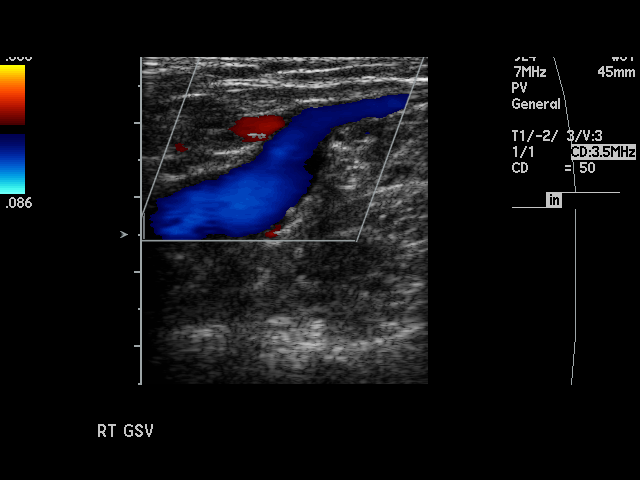
[im 26/32]
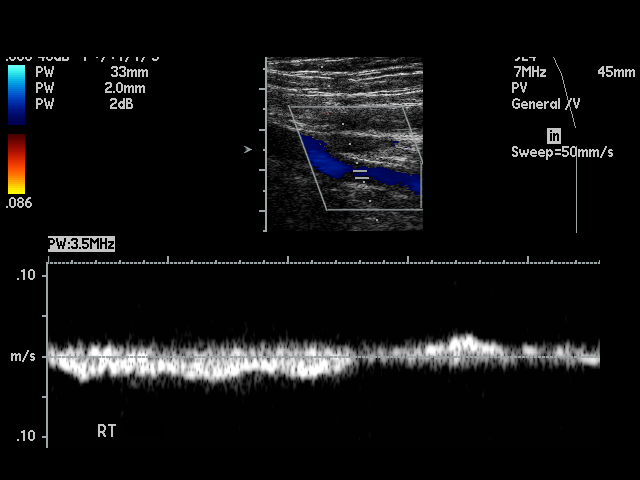
[im 27/32]
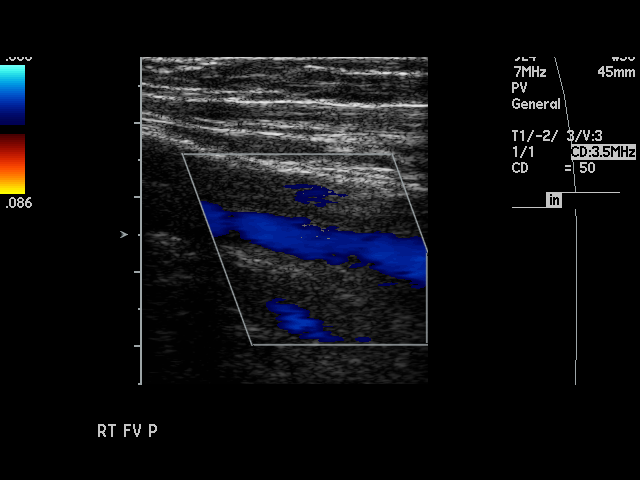
[im 29/32]
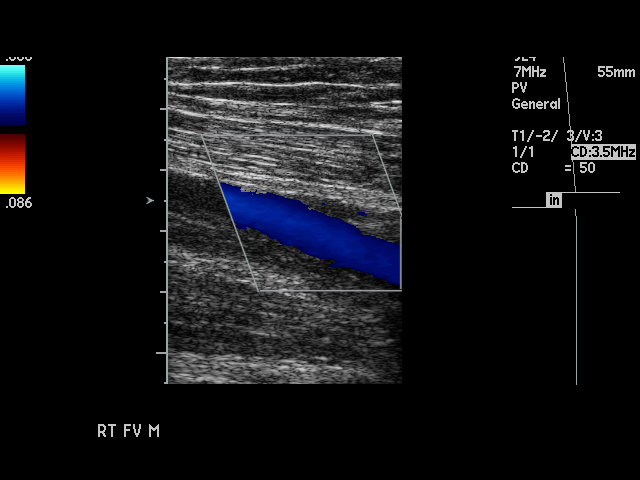
[im 32/32]
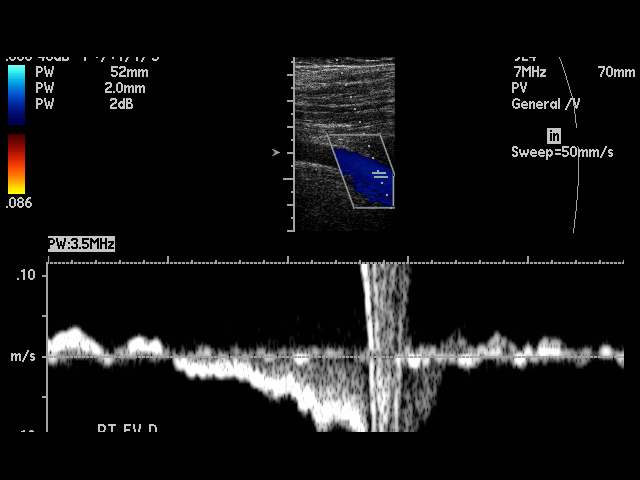

[17 of 24 positions shown; findings below may reference images not displayed]

PROCEDURE:     JENICA - JENICA DOPPLER VEINS RT LEG EXTR  - [DATE] [DATE]

RESULT:     The phasic, augmentation and Valsalva flow waveforms are normal
in appearance. There is noted focal calcification associated with the
posterior wall of the right common femoral vein. There is normal
compressibility of the femoral and popliteal vein. Doppler examination shows
no occlusion or evidence for deep vein thrombosis.
IMPRESSION: 1. No deep vein thrombosis is identified in the right leg.
2. There is noted focal venous calcification involving the posterior wall of
the common femoral artery at the level of the junction of the greater
saphenous vein.

## 2012-03-22 ENCOUNTER — Other Ambulatory Visit: Payer: Self-pay | Admitting: Internal Medicine

## 2012-03-22 DIAGNOSIS — M545 Low back pain: Secondary | ICD-10-CM

## 2012-03-30 ENCOUNTER — Other Ambulatory Visit: Payer: Self-pay

## 2012-04-08 ENCOUNTER — Emergency Department: Payer: Self-pay | Admitting: Emergency Medicine

## 2012-04-09 LAB — URINALYSIS, COMPLETE
Bacteria: NONE SEEN
Blood: NEGATIVE
Glucose,UR: NEGATIVE mg/dL (ref 0–75)
Ketone: NEGATIVE
Nitrite: NEGATIVE
Protein: NEGATIVE
Specific Gravity: 1.01 (ref 1.003–1.030)
Squamous Epithelial: 1

## 2012-04-09 LAB — ETHANOL
Ethanol %: 0.268 % — ABNORMAL HIGH (ref 0.000–0.080)
Ethanol: 268 mg/dL

## 2012-04-09 LAB — COMPREHENSIVE METABOLIC PANEL
Anion Gap: 10 (ref 7–16)
BUN: 13 mg/dL (ref 7–18)
Calcium, Total: 9.1 mg/dL (ref 8.5–10.1)
Co2: 25 mmol/L (ref 21–32)
Creatinine: 0.79 mg/dL (ref 0.60–1.30)
Glucose: 80 mg/dL (ref 65–99)
Osmolality: 286 (ref 275–301)
Potassium: 3.8 mmol/L (ref 3.5–5.1)
SGOT(AST): 99 U/L — ABNORMAL HIGH (ref 15–37)
SGPT (ALT): 106 U/L — ABNORMAL HIGH

## 2012-04-09 LAB — SALICYLATE LEVEL: Salicylates, Serum: 2.4 mg/dL

## 2012-04-09 LAB — DRUG SCREEN, URINE
Barbiturates, Ur Screen: NEGATIVE (ref ?–200)
Benzodiazepine, Ur Scrn: POSITIVE (ref ?–200)
MDMA (Ecstasy)Ur Screen: NEGATIVE (ref ?–500)
Tricyclic, Ur Screen: NEGATIVE (ref ?–1000)

## 2012-04-09 LAB — CBC
MCH: 35 pg — ABNORMAL HIGH (ref 26.0–34.0)
MCV: 100 fL (ref 80–100)
RDW: 13.6 % (ref 11.5–14.5)

## 2012-04-11 ENCOUNTER — Emergency Department: Payer: Self-pay | Admitting: Emergency Medicine

## 2012-04-11 LAB — COMPREHENSIVE METABOLIC PANEL
Albumin: 3.6 g/dL (ref 3.4–5.0)
Alkaline Phosphatase: 59 U/L (ref 50–136)
Anion Gap: 10 (ref 7–16)
BUN: 12 mg/dL (ref 7–18)
Bilirubin,Total: 0.6 mg/dL (ref 0.2–1.0)
Chloride: 108 mmol/L — ABNORMAL HIGH (ref 98–107)
EGFR (African American): >60
Osmolality: 282 (ref 275–301)
Potassium: 3.6 mmol/L (ref 3.5–5.1)
SGOT(AST): 123 U/L — ABNORMAL HIGH (ref 15–37)
SGPT (ALT): 126 U/L — ABNORMAL HIGH
Total Protein: 7.8 g/dL (ref 6.4–8.2)

## 2012-04-11 LAB — URINALYSIS, COMPLETE
Bilirubin,UR: NEGATIVE
Glucose,UR: NEGATIVE mg/dL (ref 0–75)
Leukocyte Esterase: NEGATIVE
Nitrite: NEGATIVE
RBC,UR: <1 /HPF (ref 0–5)

## 2012-04-11 LAB — ETHANOL
Ethanol %: 0.254 % — ABNORMAL HIGH (ref 0.000–0.080)
Ethanol %: 0.069 % (ref 0.000–0.080)
Ethanol: 254 mg/dL

## 2012-04-11 LAB — DRUG SCREEN, URINE
Barbiturates, Ur Screen: NEGATIVE (ref ?–200)
Benzodiazepine, Ur Scrn: POSITIVE (ref ?–200)
Cannabinoid 50 Ng, Ur ~~LOC~~: NEGATIVE (ref ?–50)
Cocaine Metabolite,Ur ~~LOC~~: NEGATIVE (ref ?–300)
Methadone, Ur Screen: NEGATIVE (ref ?–300)
Tricyclic, Ur Screen: NEGATIVE (ref ?–1000)

## 2012-04-11 LAB — TSH: Thyroid Stimulating Horm: 31.8 u[IU]/mL — ABNORMAL HIGH

## 2012-04-11 LAB — CBC
MCH: 34.8 pg — ABNORMAL HIGH (ref 26.0–34.0)
MCV: 100 fL (ref 80–100)
Platelet: 169 10*3/uL (ref 150–440)
RDW: 13.5 % (ref 11.5–14.5)
WBC: 12 10*3/uL — ABNORMAL HIGH (ref 3.8–10.6)

## 2012-04-24 ENCOUNTER — Inpatient Hospital Stay: Payer: Self-pay | Admitting: Psychiatry

## 2012-04-24 LAB — DRUG SCREEN, URINE
Barbiturates, Ur Screen: POSITIVE (ref ?–200)
Benzodiazepine, Ur Scrn: NEGATIVE (ref ?–200)
Cannabinoid 50 Ng, Ur ~~LOC~~: NEGATIVE (ref ?–50)
Cocaine Metabolite,Ur ~~LOC~~: NEGATIVE (ref ?–300)
Methadone, Ur Screen: NEGATIVE (ref ?–300)
Opiate, Ur Screen: NEGATIVE (ref ?–300)
Phencyclidine (PCP) Ur S: NEGATIVE (ref ?–25)
Tricyclic, Ur Screen: NEGATIVE (ref ?–1000)

## 2012-04-24 LAB — COMPREHENSIVE METABOLIC PANEL
Alkaline Phosphatase: 65 U/L (ref 50–136)
BUN: 7 mg/dL (ref 7–18)
Bilirubin,Total: 0.6 mg/dL (ref 0.2–1.0)
Chloride: 109 mmol/L — ABNORMAL HIGH (ref 98–107)
Co2: 23 mmol/L (ref 21–32)
Creatinine: 0.87 mg/dL (ref 0.60–1.30)
EGFR (African American): >60
SGPT (ALT): 137 U/L — ABNORMAL HIGH

## 2012-04-24 LAB — CBC
HCT: 47.4 % (ref 40.0–52.0)
MCV: 101 fL — ABNORMAL HIGH (ref 80–100)
Platelet: 193 10*3/uL (ref 150–440)
RBC: 4.72 10*6/uL (ref 4.40–5.90)
RDW: 13.8 % (ref 11.5–14.5)

## 2012-04-24 LAB — ETHANOL: Ethanol: 296 mg/dL

## 2012-04-24 LAB — SALICYLATE LEVEL: Salicylates, Serum: 1.9 mg/dL

## 2012-04-24 LAB — TSH: Thyroid Stimulating Horm: 30.2 u[IU]/mL — ABNORMAL HIGH

## 2015-02-23 NOTE — H&P (Signed)
PATIENT NAME:  Gabriel Hoffman, Gabriel Hoffman MR#:  [id number] DATE OF BIRTH:  09-23-1956  DATE OF ADMISSION:  04/24/2012  REFERRING PHYSICIAN: Daryel November, MD   ATTENDING PHYSICIAN: Kristine Linea, MD   IDENTIFYING DATA: Mr. Luzzi is a 59 year old male with history of alcoholism.   CHIEF COMPLAINT: "I cut myself."   HISTORY OF PRESENT ILLNESS: Mr. Brechbiel has a long history of drinking. When drunk he becomes "crazy" and oftentimes destroys property and hurts himself. I saw him in the emergency room on 04/11/2012 with a similar presentation where he cut his forearm requiring stitches while drunk. He usually does not drink as heavily, but when upset or stressed out he drinks a lot and loses control over his behavior. He came to the emergency room again with a cut on his forearm requiring stitches while drunk. The patient has a lot of stressors. He is a habitual felon and is facing seven years of prison time beginning in August. During his previous emergency room visit, he was referred to a local mental health center for psychotherapy. Unfortunately, he did not make contact there. He ordinarily declines medication and has never been compliant with pharmacotherapy in the community. He denies symptoms of depression, anxiety, psychosis or symptoms suggestive of bipolar mania. He denies other than alcohol substance use. He believes that some level of anxiety and depression is justified by his current legal and social situation.   PAST PSYCHIATRIC HISTORY: He has a long history of drinking and treatment noncompliance.   FAMILY PSYCHIATRIC HISTORY: There are multiple family members with a drinking problem but also numerous, numerous suicides by hanging, shooting, and throat cutting   PAST MEDICAL HISTORY:  1. Cut on forearm. 2. Status post leg surgery with rod placement.   ALLERGIES: No known drug allergies.   MEDICATIONS ON ADMISSION: None.   SOCIAL HISTORY: The patient applied for disability for  his physical injuries. He hopes to get it before he goes to jail. He lives with his sister who is very supportive, has no income or health insurance. His sentencing date will be in August.   REVIEW OF SYSTEMS: CONSTITUTIONAL: No fevers or chills. No weight changes. EYES: No double or blurred vision. ENT: No hearing loss. RESPIRATORY: No shortness of breath or cough. CARDIOVASCULAR: No chest pain or orthopnea. GASTROINTESTINAL: No abdominal pain, nausea, vomiting, or diarrhea. GU: No incontinence or frequency. ENDOCRINE: No heat or cold intolerance. LYMPHATIC: No anemia or easy bruising. INTEGUMENTARY: No acne or rash. Positive for sutured wound on left forearm. MUSCULOSKELETAL: No muscle or joint pain. NEUROLOGIC: No tingling or weakness. PSYCHIATRIC: See history of present illness for details.   PHYSICAL EXAMINATION:   VITAL SIGNS: Blood pressure 119/82, pulse 91, respirations 20, and temperature 98.6.   GENERAL: This is a well-developed male in no acute distress.   HEENT: The pupils are equal, round, and reactive to light. Sclerae anicteric.   NECK: Supple. No thyromegaly.   LUNGS: Clear to auscultation. No dullness to percussion.   HEART: Regular rhythm and rate. No murmurs, rubs, or gallops.   ABDOMEN: Soft, nontender, and nondistended. Positive bowel sounds.   MUSCULOSKELETAL: Normal muscle strength in all extremities.   SKIN: No rashes or bruises.   LYMPHATIC: No cervical adenopathy.   NEUROLOGIC: Cranial nerves II through XII are intact.   LABORATORY DATA: Chemistries are within normal limits. Blood alcohol level on admission 0.296. LFTs within normal limits, except for AST of 133 and ALT of 137. TSH 30.2. Urine tox screen positive for barbiturates.  CBC within normal limits except for white blood count of 11.2. Serum salicylates and acetaminophen are low.   MENTAL STATUS EXAMINATION ON ADMISSION: The patient is asleep but easily arousable. He is oriented to person, place, time,  and situation. He recognizes me from our previous encounter. He is pleasant, polite, and cooperative. He appears slightly shaky. His grooming is adequate. He is wearing hospital scrubs. He maintains good eye contact. His speech is soft. Mood is fine with flat affect. Thought processing is logical and goal oriented. Thought content - he denies suicidal or homicidal ideation, but was admitted after cutting his forearm seriously. There are no delusions or paranoia. There are no auditory or visual hallucinations. His cognition is grossly intact. He registers three out of three and recalls three out of three objects after 5 minutes. He can spell world forward and backward. He knows the current president. His insight and judgment are questionable.   SUICIDE RISK ASSESSMENT: This is a patient with a lifelong history of alcoholism and violent behavior while intoxicated with a family history of multiple completed suicides who continues to drink and has been treatment noncompliant and has severe social and legal stressors.   DIAGNOSES:  AXIS I: Alcohol dependence, alcohol intoxication.   AXIS II: Deferred.   AXIS III: Cuts to left forearm, status post leg fracture with rod placement.   AXIS IV: Substance abuse, treatment compliance, access to care, financial, and legal.  AXIS V: GAF on admission 25.   PLAN: The patient was admitted to [facility] Medicine unit for safety, stabilization, and medication management. He was initially placed on suicide precautions and was closely monitored for any unsafe behavior. He underwent full psychiatric and risk assessment. He received pharmacotherapy, individual and group psychotherapy, substance abuse counseling, and support from therapeutic milieu.  1. Suicidal ideation: This has resolved. The patient is able to contract for safety. He only gets suicidal when intoxicated. 2. Alcohol detox: He was placed on standard CIWA protocol. We will  monitor for symptoms of alcohol withdrawal.  3. Mood and anxiety: The patient is not interested in pharmacotherapy.  4. Substance abuse treatment: He declines participation in outpatient or residential rehab.  5. Disposition: He will be discharged to his sister and follow-up with a local mental health center if he wishes to do so. ____________________________ Braulio Conte B. Jennet Maduro, MD jbp:slb D: 04/25/2012 15:30:08 ET T: 04/25/2012 16:19:48 ET JOB#: [id number]  cc: Jolanta B. Jennet Maduro, MD, <Dictator> Shari Prows MD ELECTRONICALLY SIGNED 04/28/2012 9:18

## 2015-02-23 NOTE — Consult Note (Signed)
Brief Consult Note: Diagnosis: Alcohol intoxication, alcohol-induce mood disorder.   Patient was seen by consultant.   Consult note dictated.   Recommend further assessment or treatment.   Orders entered.   Discussed with Attending MD.   Comments: Mr. Gabriel Hoffman has a h/o alcoholism ans mood instability. He had a very stressful day in court witgh his lawyer and now faces inprisonment for 7.5 years as a habitual felon. He got drunk last night and cut his left forearm multiple times. Three of the cuts required stiches. He is no longer intoxicated, suicidal or homicidal. He is cool and collected. He is able to contract for safety.  PLAN: 1. The patient no longer meets criteria for IVC. I will terminate proceedings. Please discharge as appropriate.  2. The patient is no interested in pharmacotherapy of depression. In fact he has never been compliant with treatment in the past.  3. He is open to psychotherapy while he awaits sentencing hearing. This would include substance abuse counselling as well. He will follow up with [facility] on 04/14/12 at 10:00 with  [name].  4. Information about [facility]  transportation for MEDICAID receipients was provided.   5. He will be transported home by [name] from [employer], who will also perform a safety check..  Electronic Signatures: Kristine Linea (MD)  (Signed 11-Jun-13 10:55)  Authored: Brief Consult Note   Last Updated: 11-Jun-13 10:55 by Kristine Linea (MD)

## 2015-02-23 NOTE — Consult Note (Signed)
PATIENT NAME:  KENDALE, REMBOLD MR#:  [id number] DATE OF BIRTH:  September 10, 1956  DATE OF CONSULTATION:  04/11/2012  REFERRING PHYSICIAN:  Lurline Hare, MD CONSULTING PHYSICIAN:  Shay Jhaveri B. Davidjames Blansett, MD  REASON FOR CONSULTATION: To evaluate a suicidal patient.   IDENTIFYING DATA: Mr. Bradish is a 59 year old male with history of drinking and mood instability.   CHIEF COMPLAINT: "I am fine now."   HISTORY OF PRESENT ILLNESS: Mr. Kauth is a habitual felon. On the day of admission he met with his lawyer and signed a plea agreement. This eventually means that he will go to prison for 7-1/2 years. His court date for sentencing will be in August. He felt that he got a rotten deal and cannot imagine that he would go back to prison for so long. He will be over 62 when he is released. He got drunk and started cutting his forearm and his sister called the ambulance and the patient was brought to the Emergency Room. Three of his cuts required stitches. His blood alcohol level was 250 at the time. The patient has a history of episodic drinking. While drunk he loses control and does dangerous things, oftentimes self-injurious behavior. He was hospitalized for a similar scenario in April 2012. By the time I evaluate the patient he is sober, he is no longer suicidal or homicidal. He is reasonable. He regrets drinking and cutting. He is worried about going to prison. He denies symptoms of depression, anxiety, or psychosis. He denies symptoms suggestive of bipolar mania. There are no substances involved other than alcohol.   PAST PSYCHIATRIC HISTORY: The patient has a lifelong history of drinking. Several times  he was started on antidepressants. He is never compliant in the community He is open to psychotherapy now.   PAST MEDICAL HISTORY: Recent cuts on forearm.   ALLERGIES: No known drug allergies.   MEDICATIONS ON ADMISSION: None.   SOCIAL HISTORY: He applied for disability. He hopes to have a court hearing  maybe even next month prior to going to prison. He will spend the next 7-1/2 years in prison. He knows it now. He lives with his sister who is supportive.   REVIEW OF SYSTEMS: CONSTITUTIONAL: No fevers or chills. No weight changes. EYES: No double or blurred vision. ENT: No hearing loss. RESPIRATORY: No shortness of breath or cough. CARDIOVASCULAR: No chest pain or orthopnea. GASTROINTESTINAL: No abdominal pain, nausea, vomiting, or diarrhea. GU: No incontinence or frequency.  ENDOCRINE: No heat or cold intolerance. LYMPHATIC: No anemia or easy bruising.  INTEGUMENT: No acne or rash. Positive for multiple cuts on left forearm, some with stitches that are now covered with dressings. MUSCULOSKELETAL: No muscle or joint pain. NEUROLOGIC: No tingling or weakness. PSYCHIATRIC: See history of present illness for details.   PHYSICAL EXAMINATION:  VITAL SIGNS: Blood pressure 132/76, pulse 85, respirations 18, temperature 96.5.   GENERAL: This is a well-developed male in no acute distress. The rest of the physical examination is deferred to his primary attending.   LABORATORY, DIAGNOSTIC, AND RADIOLOGICAL DATA: Chemistries are within normal limits. Blood alcohol level on admission was 0.254. LFTs are within normal limits except for AST of 123, ALT of 126. TSH 31.8 with free thyroxine 0.77. Urine tox screen is positive for benzodiazepines. CBC within normal limits with white blood count of 1200. Urinalysis is not suggestive of urinary tract infection. Serum acetaminophen and salicylates are low.   MENTAL STATUS EXAMINATION: The patient is alert and oriented to person, place, time, and situation.  He is pleasant, polite, and cooperative. He is cool and collected. He is on the hospital stretcher.  He is well groomed and wears hospital scrubs. He maintains good eye contact. His speech is of normal rhythm, rate, and volume. His mood is okay with full affect. Thought processing is logical and goal oriented. Thought  content: He denies suicidal or homicidal ideation but was brought to the hospital after cutting his forearm while drunk. There are no delusions or paranoia. There are no auditory or visual hallucinations. His cognition is grossly intact. He registers three out of three and recalls three out of three objects after five minutes. He can spell world forwards and backwards. He knows the current president. His insight and judgment are questionable.   SUICIDE RISK ASSESSMENT: This is a patient with a lifelong history of episodic drinking and self-injurious behaviors while drunk. He does not consume alcohol more than twice a month, he says, but was seen in the Emergency Room just two days prior while intoxicated. He is under considerable stress related to his legal conviction. He, however, agrees to follow up with therapist and substance abuse counselor in the community.   DIAGNOSES:  AXIS I: Alcohol intoxication. Adjustment disorder with depressed mood.   AXIS II: Deferred.   AXIS III: Cuts to left forearm.  AXIS IV: Substance abuse, treatment compliance, access to care, financial, legal.   AXIS V: GAF: 45.   PLAN:  1. The patient no longer meets criteria for involuntary inpatient psychiatric commitment. I will terminate proceedings. Please discharge as appropriate.  2. The patient is not interested in pharmacotherapy for depression. He is usually noncompliant with treatment in the community. 3. The patient is open to psychotherapy while he awaits sentencing hearing in August. 4. Substance abuse and depression. He will follow up with [facility] on 04/14/2012 at 10:00 with Estill Dooms.   5. We provided him information about [employer] Medicaid transportation.  6. He was transferred to home by Lanny Hurst from [facility] who will also perform safety checks on the patient.  ____________________________ Wardell Honour. Bary Leriche, MD jbp:bjt D: 04/12/2012 11:31:09 ET T: 04/12/2012 12:33:37  ET JOB#: [id number]  cc: Niccolas Loeper B. Bary Leriche, MD, <Dictator> Clovis Fredrickson MD ELECTRONICALLY SIGNED 04/13/2012 21:57

## 2017-11-01 DIAGNOSIS — I219 Acute myocardial infarction, unspecified: Secondary | ICD-10-CM

## 2017-11-01 HISTORY — DX: Acute myocardial infarction, unspecified: I21.9

## 2020-01-02 ENCOUNTER — Telehealth: Payer: Self-pay

## 2020-01-02 NOTE — Telephone Encounter (Signed)
CONFIRMED AND SCREENED FOR 01-04-20 OV. 

## 2020-01-04 ENCOUNTER — Encounter (INDEPENDENT_AMBULATORY_CARE_PROVIDER_SITE_OTHER): Payer: Self-pay

## 2020-01-04 ENCOUNTER — Ambulatory Visit: Payer: Medicaid Other | Admitting: Nurse Practitioner

## 2020-01-04 ENCOUNTER — Encounter: Payer: Self-pay | Admitting: Nurse Practitioner

## 2020-01-04 ENCOUNTER — Other Ambulatory Visit: Payer: Self-pay | Admitting: Nurse Practitioner

## 2020-01-04 ENCOUNTER — Other Ambulatory Visit: Payer: Self-pay

## 2020-01-04 VITALS — BP 172/90 | HR 62 | Temp 97.5°F | Resp 16 | Ht 66.0 in | Wt 172.6 lb

## 2020-01-04 DIAGNOSIS — E782 Mixed hyperlipidemia: Secondary | ICD-10-CM | POA: Insufficient documentation

## 2020-01-04 DIAGNOSIS — I10 Essential (primary) hypertension: Secondary | ICD-10-CM | POA: Diagnosis not present

## 2020-01-04 DIAGNOSIS — I2510 Atherosclerotic heart disease of native coronary artery without angina pectoris: Secondary | ICD-10-CM

## 2020-01-04 DIAGNOSIS — F411 Generalized anxiety disorder: Secondary | ICD-10-CM | POA: Diagnosis not present

## 2020-01-04 DIAGNOSIS — I2583 Coronary atherosclerosis due to lipid rich plaque: Secondary | ICD-10-CM

## 2020-01-04 MED ORDER — BUSPIRONE HCL 10 MG PO TABS: ORAL_TABLET | ORAL | 2 refills | Status: DC

## 2020-01-04 MED ORDER — HYDROCHLOROTHIAZIDE 12.5 MG PO TABS: 12.5000 mg | ORAL_TABLET | Freq: Every day | ORAL | 3 refills | Status: DC

## 2020-01-04 NOTE — Progress Notes (Signed)
[facility] [place], [place] [address]  Internal MEDICINE  Office Visit Note  Patient Name: Gabriel Hoffman  [id number]  [id number]  Date of Service: 01/04/2020   Complaints/HPI Pt is here for establishment of PCP. Chief Complaint  Patient presents with  . New Patient (Initial Visit)  . Hypertension  . Hyperlipidemia   The patient is here to re-establish [facility] as primary care provider. He has been in prison since August 2013. He suffered two heart attacks while in prison. They were in 11/2018 and 12/2018. He did have surgery in 07/2019 to correct blocked coronary artery. States this was done in [place]. Blood pressure is moderately elevated. States that It does tend to run high. Needs to have referral to local cardiologist. He denies chest pain, chest pressure, shortness of breath, or headaches. Does have some anxiety with panic attacks. He states that these have been worse and more often since he was released from prison. He is due to have routine, fasting labs and annual wellness visit. He states that he did have colonoscopy in 2019 in [place]. The patient is a smoker. States that he smokes between 1/2 and 1 pack of cigarettes per day. He is interested in smokingcessation after he gets established with local providers.    Current Medication: Outpatient Encounter Medications as of 01/04/2020  Medication Sig  . atorvastatin (LIPITOR) 10 MG tablet Take 10 mg by mouth daily.  Marland Kitchen levothyroxine (SYNTHROID) 125 MCG tablet Take 125 mcg by mouth daily before breakfast.  . metoprolol succinate (TOPROL-XL) 100 MG 24 hr tablet Take 100 mg by mouth daily. Take with or immediately following a meal.  . nitroGLYCERIN (NITRODUR - DOSED IN MG/24 HR) 0.4 mg/hr patch Place 0.4 mg onto the skin daily.  . pantoprazole (PROTONIX) 20 MG tablet Take 20 mg by mouth daily.  . rivaroxaban (XARELTO) 20 MG TABS tablet Take 20 mg by mouth daily with supper.  .  busPIRone (BUSPAR) 10 MG tablet Take 1/2 to 1 tablet po BID prn anxiety  . hydrochlorothiazide (HYDRODIURIL) 12.5 MG tablet Take 1 tablet (12.5 mg total) by mouth daily.   No facility-administered encounter medications on file as of 01/04/2020.    Surgical History: History reviewed. No pertinent surgical history.  Medical History: Past Medical History:  Diagnosis Date  . Hypertension   . Thyroid disease     Family History: Family History  Problem Relation Age of Onset  . Cancer Mother   . COPD Mother   . Cancer Sister     Social History   Socioeconomic History  . Marital status: Single    Spouse name: Not on file  . Number of children: Not on file  . Years of education: Not on file  . Highest education level: Not on file  Occupational History  . Not on file  Tobacco Use  . Smoking status: Current Every Day Smoker    Packs/day: 1.00    Types: Cigarettes  . Smokeless tobacco: Never Used  Substance and Sexual Activity  . Alcohol use: Never  . Drug use: Never  . Sexual activity: Not on file  Other Topics Concern  . Not on file  Social History Narrative  . Not on file   Social Determinants of Health   Financial Resource Strain:   . Difficulty of Paying Living Expenses: Not on file  Food Insecurity:   . Worried About Charity fundraiser in the Last Year: Not on file  .  Ran Out of Food in the Last Year: Not on file  Transportation Needs:   . Lack of Transportation (Medical): Not on file  . Lack of Transportation (Non-Medical): Not on file  Physical Activity:   . Days of Exercise per Week: Not on file  . Minutes of Exercise per Session: Not on file  Stress:   . Feeling of Stress : Not on file  Social Connections:   . Frequency of Communication with Friends and Family: Not on file  . Frequency of Social Gatherings with Friends and Family: Not on file  . Attends Religious Services: Not on file  . Active Member of Clubs or Organizations: Not on file  . Attends  Banker Meetings: Not on file  . Marital Status: Not on file  Intimate Partner Violence:   . Fear of Current or Ex-Partner: Not on file  . Emotionally Abused: Not on file  . Physically Abused: Not on file  . Sexually Abused: Not on file     Review of Systems  Constitutional: Negative for activity change, chills, fatigue and unexpected weight change.  HENT: Negative for congestion, postnasal drip, rhinorrhea, sneezing and sore throat.   Respiratory: Negative for cough, chest tightness, shortness of breath and wheezing.   Cardiovascular: Negative for chest pain and palpitations.       Moderately elevated blood pressure today.   Gastrointestinal: Negative for abdominal pain, constipation, diarrhea, nausea and vomiting.  Endocrine: Negative for cold intolerance, heat intolerance, polydipsia and polyuria.  Musculoskeletal: Negative for arthralgias, back pain, joint swelling and neck pain.  Skin: Negative for rash.  Allergic/Immunologic: Negative for environmental allergies.  Neurological: Negative for dizziness, tremors, numbness and headaches.  Hematological: Negative for adenopathy. Does not bruise/bleed easily.  Psychiatric/Behavioral: Negative for behavioral problems (Depression), sleep disturbance and suicidal ideas. The patient is nervous/anxious.     Today's Vitals   01/04/20 0851  BP: (!) 172/90  Pulse: (!) 44  Resp: 16  Temp: (!) 97.5 F (36.4 C)  SpO2: 95%  Weight: 172 lb 9.6 oz (78.3 kg)  Height: 5\' 6"  (1.676 m)   Body mass index is 27.86 kg/m.  Physical Exam Vitals and nursing note reviewed.  Constitutional:      General: He is not in acute distress.    Appearance: Normal appearance. He is well-developed. He is not diaphoretic.  HENT:     Head: Normocephalic and atraumatic.     Nose: Nose normal.     Mouth/Throat:     Pharynx: No oropharyngeal exudate.  Eyes:     Pupils: Pupils are equal, round, and reactive to light.  Neck:     Thyroid: No  thyromegaly.     Vascular: No carotid bruit or JVD.     Trachea: No tracheal deviation.  Cardiovascular:     Rate and Rhythm: Normal rate and regular rhythm.     Pulses: Normal pulses.     Heart sounds: Normal heart sounds. No murmur. No friction rub. No gallop.   Pulmonary:     Effort: Pulmonary effort is normal. No respiratory distress.     Breath sounds: No wheezing or rales.     Comments: Mildly congested breath sounds throughout the lung fields . Chest:     Chest wall: No tenderness.  Abdominal:     Palpations: Abdomen is soft.  Musculoskeletal:        General: Normal range of motion.     Cervical back: Normal range of motion and neck supple.  Lymphadenopathy:  Cervical: No cervical adenopathy.  Skin:    General: Skin is warm and dry.  Neurological:     Mental Status: He is alert and oriented to person, place, and time.     Cranial Nerves: No cranial nerve deficit.  Psychiatric:        Attention and Perception: Attention and perception normal.        Mood and Affect: Affect normal. Mood is anxious.        Speech: Speech normal.        Behavior: Behavior normal. Behavior is cooperative.        Thought Content: Thought content normal.        Cognition and Memory: Cognition normal.        Judgment: Judgment normal.    Assessment/Plan: 1. Essential hypertension moderat elevation of blood pressure today. Add hydrochlorothiazide 12.5mg  daily. Continue current dose metoprolol ER 100mg . - hydrochlorothiazide (HYDRODIURIL) 12.5 MG tablet; Take 1 tablet (12.5 mg total) by mouth daily.  Dispense: 90 tablet; Refill: 3  2. Coronary artery disease due to lipid rich plaque Two prior myocardial infarcts. Request medical records to review. Refer to cardiology for surveillance. Carotid doppler study ordered for further evaluationn.  - Ambulatory referral to Cardiology - Carotid Bilateral; Future  3. Mixed hyperlipidemia Fasting lipid panel ordered. Adjust dose of atorvastatin  as indicated.   4. Generalized anxiety disorder Add buspirone 10mg . May take 1/2 to 1 tablet twice daily as needed for acute anxiety.  - busPIRone (BUSPAR) 10 MG tablet; Take 1/2 to 1 tablet po BID prn anxiety  Dispense: 60 tablet; Refill: 2  General Counseling: amdrew oboyle understanding of the findings of todays visit and agrees with plan of treatment. I have discussed any further diagnostic evaluation that may be needed or ordered today. We also reviewed his medications today. he has been encouraged to call the office with any questions or concerns that should arise related to todays visit.    Counseling:  Cardiac risk factor modification:  1. Control blood pressure. 2. Exercise as prescribed. 3. Follow low sodium, low fat diet. and low fat and low cholestrol diet. 4. Take ASA 81mg  once a day. 5. Restricted calories diet to lose weight.  This patient was seen by [facility] Collaboration with Dr Elyse Hsu as a part of collaborative care agreement  Orders Placed This Encounter  Procedures  . Carotid Bilateral  . Ambulatory referral to Cardiology    Meds ordered this encounter  Medications  . hydrochlorothiazide (HYDRODIURIL) 12.5 MG tablet    Sig: Take 1 tablet (12.5 mg total) by mouth daily.    Dispense:  90 tablet    Refill:  3    Order Specific Question:   Supervising Provider    Answer:   Vincent Gros Lyndon Code  . busPIRone (BUSPAR) 10 MG tablet    Sig: Take 1/2 to 1 tablet po BID prn anxiety    Dispense:  60 tablet    Refill:  2    Order Specific Question:   Supervising Provider    Answer:   [place] Wyline Mood    Time spent: 45 Minutes

## 2020-01-05 LAB — CBC
Hematocrit: 47.5 % (ref 37.5–51.0)
Hemoglobin: 17.2 g/dL (ref 13.0–17.7)
MCH: 33.9 pg — ABNORMAL HIGH (ref 26.6–33.0)
MCHC: 36.2 g/dL — ABNORMAL HIGH (ref 31.5–35.7)
MCV: 94 fL (ref 79–97)
Platelets: 188 10*3/uL (ref 150–450)
RBC: 5.08 x10E6/uL (ref 4.14–5.80)
RDW: 12.9 % (ref 11.6–15.4)
WBC: 8.4 10*3/uL (ref 3.4–10.8)

## 2020-01-05 LAB — COMPREHENSIVE METABOLIC PANEL
ALT: 62 IU/L — ABNORMAL HIGH (ref 0–44)
AST: 64 IU/L — ABNORMAL HIGH (ref 0–40)
Albumin/Globulin Ratio: 1 — ABNORMAL LOW (ref 1.2–2.2)
Albumin: 3.5 g/dL — ABNORMAL LOW (ref 3.8–4.8)
Alkaline Phosphatase: 140 IU/L — ABNORMAL HIGH (ref 39–117)
BUN/Creatinine Ratio: 15 (ref 10–24)
BUN: 13 mg/dL (ref 8–27)
Bilirubin Total: 1.1 mg/dL (ref 0.0–1.2)
CO2: 23 mmol/L (ref 20–29)
Calcium: 8.9 mg/dL (ref 8.6–10.2)
Chloride: 102 mmol/L (ref 96–106)
Creatinine, Ser: 0.88 mg/dL (ref 0.76–1.27)
GFR calc Af Amer: 107 mL/min/{1.73_m2} (ref >59)
GFR calc non Af Amer: 93 mL/min/{1.73_m2} (ref >59)
Globulin, Total: 3.6 g/dL (ref 1.5–4.5)
Glucose: 102 mg/dL — ABNORMAL HIGH (ref 65–99)
Potassium: 3.9 mmol/L (ref 3.5–5.2)
Sodium: 137 mmol/L (ref 134–144)
Total Protein: 7.1 g/dL (ref 6.0–8.5)

## 2020-01-05 LAB — LIPID PANEL W/O CHOL/HDL RATIO
Cholesterol, Total: 102 mg/dL (ref 100–199)
HDL: 23 mg/dL — ABNORMAL LOW (ref >39)
LDL Chol Calc (NIH): 38 mg/dL (ref 0–99)
Triglycerides: 268 mg/dL — ABNORMAL HIGH (ref 0–149)
VLDL Cholesterol Cal: 41 mg/dL — ABNORMAL HIGH (ref 5–40)

## 2020-01-05 LAB — T4, FREE: Free T4: 1.86 ng/dL — ABNORMAL HIGH (ref 0.82–1.77)

## 2020-01-05 LAB — COMMENT2 - HEP PANEL

## 2020-01-05 LAB — PSA: Prostate Specific Ag, Serum: 1.1 ng/mL (ref 0.0–4.0)

## 2020-01-05 LAB — HEPATITIS C ANTIBODY (REFLEX): HCV Ab: >11 s/co ratio — ABNORMAL HIGH (ref 0.0–0.9)

## 2020-01-05 LAB — TSH: TSH: 0.731 u[IU]/mL (ref 0.450–4.500)

## 2020-01-10 ENCOUNTER — Ambulatory Visit (INDEPENDENT_AMBULATORY_CARE_PROVIDER_SITE_OTHER): Payer: Medicaid Other | Admitting: Cardiology

## 2020-01-10 ENCOUNTER — Encounter: Payer: Self-pay | Admitting: Cardiology

## 2020-01-10 ENCOUNTER — Other Ambulatory Visit: Payer: Self-pay

## 2020-01-10 VITALS — BP 144/84 | HR 77 | Ht 66.0 in | Wt 172.2 lb

## 2020-01-10 DIAGNOSIS — I480 Paroxysmal atrial fibrillation: Secondary | ICD-10-CM

## 2020-01-10 DIAGNOSIS — F17200 Nicotine dependence, unspecified, uncomplicated: Secondary | ICD-10-CM

## 2020-01-10 DIAGNOSIS — I2510 Atherosclerotic heart disease of native coronary artery without angina pectoris: Secondary | ICD-10-CM

## 2020-01-10 DIAGNOSIS — I10 Essential (primary) hypertension: Secondary | ICD-10-CM

## 2020-01-10 NOTE — Progress Notes (Signed)
Cardiology Office Note:    Date:  01/10/2020   ID:  Gabriel Hoffman, DOB [birth year], MRN [id number]  PCP:  Carlean Jews, NP  Cardiologist:  No primary care provider on file.  Electrophysiologist:  None   Referring MD: Carlean Jews, NP   Chief Complaint  Patient presents with  . New Patient (Initial Visit)    CAD; Meds verbally reviewed with patient.    History of Present Illness:    Gabriel Hoffman is a 64 y.o. male with a hx of hypertension, hyperlipidemia, atrial fibrillation status post ablation in September 2020, prior MI in (Feb 2020), current smoker who presents due to history of CAD.  Patient states having a heart attack in February 2020 while he was incarcerated.  He was taken to [facility] in [facility].  He states being there for 1 week, no procedures were performed and he was placed on meds.  In September of he states being taken to [facility] where he was diagnosed with atrial fibrillation.  He had an ablation and was started on Xarelto.  He recently followed up with a primary care provider in the area to establish care.  His blood pressures were elevated and he was started on hydrochlorothiazide 12.5 mg daily.  He is a current smoker and is working on quitting.  He denies chest pain or shortness of breath at rest or with exertion.  Past Medical History:  Diagnosis Date  . Hypertension   . Thyroid disease     Past Surgical History:  Procedure Laterality Date  . CARDIAC CATHETERIZATION    . CATARACT EXTRACTION    . LEG SURGERY Right    rod in leg  . WRIST SURGERY Left     Current Medications: Current Meds  Medication Sig  . atorvastatin (LIPITOR) 10 MG tablet Take 10 mg by mouth daily.  . busPIRone (BUSPAR) 10 MG tablet Take 1/2 to 1 tablet po BID prn anxiety  . hydrochlorothiazide (HYDRODIURIL) 12.5 MG tablet Take 1 tablet (12.5 mg total) by mouth daily.  Marland Kitchen levothyroxine (SYNTHROID) 125 MCG tablet Take 125 mcg by  mouth daily before breakfast.  . metoprolol succinate (TOPROL-XL) 100 MG 24 hr tablet Take 100 mg by mouth daily. Take with or immediately following a meal.  . nitroGLYCERIN (NITRODUR - DOSED IN MG/24 HR) 0.4 mg/hr patch Place 0.4 mg onto the skin daily.  . pantoprazole (PROTONIX) 20 MG tablet Take 20 mg by mouth daily.  . rivaroxaban (XARELTO) 20 MG TABS tablet Take 20 mg by mouth daily with supper.     Allergies:   Patient has no allergy information on record.   Social History   Socioeconomic History  . Marital status: Single    Spouse name: Not on file  . Number of children: Not on file  . Years of education: Not on file  . Highest education level: Not on file  Occupational History  . Not on file  Tobacco Use  . Smoking status: Current Every Day Smoker    Packs/day: 1.00    Types: Cigarettes  . Smokeless tobacco: Never Used  Substance and Sexual Activity  . Alcohol use: Never  . Drug use: Never  . Sexual activity: Not on file  Other Topics Concern  . Not on file  Social History Narrative  . Not on file   Social Determinants of Health   Financial Resource Strain:   . Difficulty of Paying Living Expenses:   Food Insecurity:   .  Worried About Charity fundraiser in the Last Year:   . Arboriculturist in the Last Year:   Transportation Needs:   . Film/video editor (Medical):   Marland Kitchen Lack of Transportation (Non-Medical):   Physical Activity:   . Days of Exercise per Week:   . Minutes of Exercise per Session:   Stress:   . Feeling of Stress :   Social Connections:   . Frequency of Communication with Friends and Family:   . Frequency of Social Gatherings with Friends and Family:   . Attends Religious Services:   . Active Member of Clubs or Organizations:   . Attends Archivist Meetings:   Marland Kitchen Marital Status:      Family History: The patient's family history includes COPD in his mother; Cancer in his mother and sister.  ROS:   Please see the history of  present illness.     All other systems reviewed and are negative.  EKGs/Labs/Other Studies Reviewed:    The following studies were reviewed today:   EKG:  EKG is  ordered today.  The ekg ordered today demonstrates sinus rhythm, frequent PVCs.  Recent Labs: 01/04/2020: ALT 62; BUN 13; Creatinine, Ser 0.88; Hemoglobin 17.2; Platelets 188; Potassium 3.9; Sodium 137; TSH 0.731  Recent Lipid Panel    Component Value Date/Time   CHOL 102 01/04/2020 1310   TRIG 268 (H) 01/04/2020 1310   HDL 23 (L) 01/04/2020 1310   LDLCALC 38 01/04/2020 1310    Physical Exam:    VS:  BP (!) 144/84 (BP Location: Right Arm, Patient Position: Sitting, Cuff Size: Normal)   Pulse 77   Ht 5\' 6"  (1.676 m)   Wt 172 lb 4 oz (78.1 kg)   SpO2 96%   BMI 27.80 kg/m     Wt Readings from Last 3 Encounters:  01/10/20 172 lb 4 oz (78.1 kg)  01/04/20 172 lb 9.6 oz (78.3 kg)     GEN:  Well nourished, well developed in no acute distress HEENT: Normal NECK: No JVD; No carotid bruits LYMPHATICS: No lymphadenopathy CARDIAC: RRR, no murmurs, rubs, gallops RESPIRATORY:  Clear to auscultation without rales ABDOMEN: Soft, non-tender, non-distended MUSCULOSKELETAL:  No edema; No deformity  SKIN: Warm and dry` NEUROLOGIC:  Alert and oriented x 3 PSYCHIATRIC:  Normal affect   ASSESSMENT:    1. Coronary artery disease involving native coronary artery of native heart without angina pectoris   2. PAF (paroxysmal atrial fibrillation) ([place])   3. Essential hypertension   4. Smoking    PLAN:    In order of problems listed above:  1. Patient states having a history of MI in February 2020.  Currently denies chest pain or shortness of breath.  Will try to obtain medical records from outside facility.  Get echocardiogram to evaluate for any structural abnormalities.  Continue statin, last LDL is at goal of 38. 2. History of atrial fibrillation status post ablation in September 2020 at [facility].  We will try to  obtain records to confirm ablation and A. fib history.  He is currently in sinus rhythm.  Continue Toprol-XL and Xarelto as prescribed.  3. Patient with history of hypertension.  Recently started on hydrochlorothiazide 12.5 daily.  Blood pressure is improved but still elevated.  Will monitor blood pressure at follow-up visit.  If elevated, will increase dose of HCTZ. 4. Patient is a current smoker.  Smoking cessation advised.  Follow-up after echocardiogram.  This note was generated in  part or whole with voice recognition software. Voice recognition is usually quite accurate but there are transcription errors that can and very often do occur. I apologize for any typographical errors that were not detected and corrected.  Medication Adjustments/Labs and Tests Ordered: Current medicines are reviewed at length with the patient today.  Concerns regarding medicines are outlined above.  Orders Placed This Encounter  Procedures  . EKG 12-Lead  . ECHOCARDIOGRAM COMPLETE   No orders of the defined types were placed in this encounter.   Patient Instructions  Medication Instructions:  Your physician recommends that you continue on your current medications as directed. Please refer to the Current Medication list given to you today.  *If you need a refill on your cardiac medications before your next appointment, please call your pharmacy*   Lab Work: None ordered If you have labs (blood work) drawn today and your tests are completely normal, you will receive your results only by: Marland Kitchen MyChart Message (if you have MyChart) OR . A paper copy in the mail If you have any lab test that is abnormal or we need to change your treatment, we will call you to review the results.   Testing/Procedures: Your physician has requested that you have an echocardiogram. Echocardiography is a painless test that uses sound waves to create images of your heart. It provides your doctor with information about the size and  shape of your heart and how well your heart's chambers and valves are working. This procedure takes approximately one hour. There are no restrictions for this procedure.     Follow-Up: At [facility], you and your health needs are our priority.  As part of our continuing mission to provide you with exceptional heart care, we have created designated Provider Care Teams.  These Care Teams include your primary Cardiologist (physician) and Advanced Practice Providers (APPs -  Physician Assistants and Nurse Practitioners) who all work together to provide you with the care you need, when you need it.  We recommend signing up for the patient portal called "MyChart".  Sign up information is provided on this After Visit Summary.  MyChart is used to connect with patients for Virtual Visits (Telemedicine).  Patients are able to view lab/test results, encounter notes, upcoming appointments, etc.  Non-urgent messages can be sent to your provider as well.   To learn more about what you can do with MyChart, go to [email or web address].    Your next appointment:   After the echo   The format for your next appointment:   In Person  Provider:    You may seeDr. Azucena Cecil or one of the following Advanced Practice Providers on your designated Care Team:    Nicolasa Ducking, NP  Eula Listen, PA-C  Marisue Ivan, PA-C    Other Instructions We will request your previous cardiac records for [facility] and [facility]      Signed, Debbe Odea, MD  01/10/2020 11:16 AM    [facility]

## 2020-01-10 NOTE — Patient Instructions (Addendum)
Medication Instructions:  Your physician recommends that you continue on your current medications as directed. Please refer to the Current Medication list given to you today.  *If you need a refill on your cardiac medications before your next appointment, please call your pharmacy*   Lab Work: None ordered If you have labs (blood work) drawn today and your tests are completely normal, you will receive your results only by: Marland Kitchen MyChart Message (if you have MyChart) OR . A paper copy in the mail If you have any lab test that is abnormal or we need to change your treatment, we will call you to review the results.   Testing/Procedures: Your physician has requested that you have an echocardiogram. Echocardiography is a painless test that uses sound waves to create images of your heart. It provides your doctor with information about the size and shape of your heart and how well your heart's chambers and valves are working. This procedure takes approximately one hour. There are no restrictions for this procedure.     Follow-Up: At [facility], you and your health needs are our priority.  As part of our continuing mission to provide you with exceptional heart care, we have created designated Provider Care Teams.  These Care Teams include your primary Cardiologist (physician) and Advanced Practice Providers (APPs -  Physician Assistants and Nurse Practitioners) who all work together to provide you with the care you need, when you need it.  We recommend signing up for the patient portal called "MyChart".  Sign up information is provided on this After Visit Summary.  MyChart is used to connect with patients for Virtual Visits (Telemedicine).  Patients are able to view lab/test results, encounter notes, upcoming appointments, etc.  Non-urgent messages can be sent to your provider as well.   To learn more about what you can do with MyChart, go to [email or web address].    Your next appointment:    After the echo   The format for your next appointment:   In Person  Provider:    You may seeDr. Azucena Cecil or one of the following Advanced Practice Providers on your designated Care Team:    Nicolasa Ducking, NP  Eula Listen, PA-C  Marisue Ivan, PA-C    Other Instructions We will request your previous cardiac records for [facility] and [facility]

## 2020-01-14 NOTE — Progress Notes (Signed)
Hey. Is there a place this week or next week, where I can see the patient back to discuss lab results. I think I will need 30 minute block.

## 2020-01-15 ENCOUNTER — Telehealth: Payer: Self-pay

## 2020-01-15 NOTE — Telephone Encounter (Signed)
Left message and asked patient to call and schedule appointment for lab review. Beth

## 2020-01-15 NOTE — Progress Notes (Signed)
Left message and asked pt to call and schedule appointment for lab review. Beth

## 2020-01-16 ENCOUNTER — Telehealth: Payer: Self-pay

## 2020-01-16 NOTE — Telephone Encounter (Signed)
Left a message and asked pt to schd appt for lab review and to confirm ultrasound appt for 01/18/20

## 2020-01-18 ENCOUNTER — Ambulatory Visit: Payer: Medicaid Other

## 2020-01-18 ENCOUNTER — Other Ambulatory Visit: Payer: Self-pay

## 2020-01-18 DIAGNOSIS — I2510 Atherosclerotic heart disease of native coronary artery without angina pectoris: Secondary | ICD-10-CM

## 2020-01-18 DIAGNOSIS — I6523 Occlusion and stenosis of bilateral carotid arteries: Secondary | ICD-10-CM | POA: Diagnosis not present

## 2020-01-18 DIAGNOSIS — I2583 Coronary atherosclerosis due to lipid rich plaque: Secondary | ICD-10-CM

## 2020-01-21 ENCOUNTER — Other Ambulatory Visit: Payer: Self-pay

## 2020-01-21 ENCOUNTER — Encounter: Payer: Self-pay | Admitting: Nurse Practitioner

## 2020-01-21 ENCOUNTER — Ambulatory Visit: Payer: Medicaid Other | Admitting: Nurse Practitioner

## 2020-01-21 VITALS — BP 155/96 | HR 97 | Temp 97.8°F | Resp 16 | Ht 66.0 in | Wt 168.6 lb

## 2020-01-21 DIAGNOSIS — Z8619 Personal history of other infectious and parasitic diseases: Secondary | ICD-10-CM

## 2020-01-21 DIAGNOSIS — E039 Hypothyroidism, unspecified: Secondary | ICD-10-CM

## 2020-01-21 DIAGNOSIS — F411 Generalized anxiety disorder: Secondary | ICD-10-CM | POA: Diagnosis not present

## 2020-01-21 DIAGNOSIS — R945 Abnormal results of liver function studies: Secondary | ICD-10-CM

## 2020-01-21 MED ORDER — LEVOTHYROXINE SODIUM 112 MCG PO TABS: 112.0000 ug | ORAL_TABLET | Freq: Every day | ORAL | 3 refills | Status: DC

## 2020-01-21 NOTE — Progress Notes (Signed)
[facility] [address] [place], [place] [address]  Internal MEDICINE  Office Visit Note  Patient Name: Gabriel Hoffman  [id number]  [id number]  Date of Service: 01/30/2020  Chief Complaint  Patient presents with  . Follow-up    review labs   . Medication Refill    buspar is causing dry mouth, vivid dreams, night sweats     The patient is here for routine follow up visit. Had labs done prior to this visit. Liver enzymes were elevated and hepatitis c antibodies were elevated. He states that he has been told he had hepatitis C in the past. While in prison, he states that he received treatment for this and was also told it was gone. He denies abdominal pain, nausea, vomiting, diarrhea, or cramping.  He states that he did try taking buspar as needed for anxiety. States that it gave him bad dreams and night sweats. He has stopped taking this. He does need refills of his levothyroxine. Recent tests show that thyroid is slightly overactive. He had episode of chest pain/tachycardia over the weekend. Had to take prescribed nitroglycerin and rest for a while before it went away. He has not contacted his cardiologist at this point.       Current Medication: Outpatient Encounter Medications as of 01/21/2020  Medication Sig  . atorvastatin (LIPITOR) 10 MG tablet Take 10 mg by mouth daily.  . hydrochlorothiazide (HYDRODIURIL) 12.5 MG tablet Take 1 tablet (12.5 mg total) by mouth daily.  Marland Kitchen levothyroxine (SYNTHROID) 112 MCG tablet Take 1 tablet (112 mcg total) by mouth daily before breakfast.  . metoprolol succinate (TOPROL-XL) 100 MG 24 hr tablet Take 100 mg by mouth daily. Take with or immediately following a meal.  . nitroGLYCERIN (NITRODUR - DOSED IN MG/24 HR) 0.4 mg/hr patch Place 0.4 mg onto the skin daily.  . pantoprazole (PROTONIX) 20 MG tablet Take 20 mg by mouth daily.  . rivaroxaban (XARELTO) 20 MG TABS tablet Take 20 mg by mouth daily with supper.  . [DISCONTINUED]  levothyroxine (SYNTHROID) 125 MCG tablet Take 125 mcg by mouth daily before breakfast.  . [DISCONTINUED] busPIRone (BUSPAR) 10 MG tablet Take 1/2 to 1 tablet po BID prn anxiety (Patient not taking: Reported on 01/21/2020)   No facility-administered encounter medications on file as of 01/21/2020.    Surgical History: Past Surgical History:  Procedure Laterality Date  . CARDIAC CATHETERIZATION    . CATARACT EXTRACTION    . LEG SURGERY Right    rod in leg  . WRIST SURGERY Left     Medical History: Past Medical History:  Diagnosis Date  . Hypertension   . Thyroid disease     Family History: Family History  Problem Relation Age of Onset  . Cancer Mother   . COPD Mother   . Cancer Sister     Social History   Socioeconomic History  . Marital status: Single    Spouse name: Not on file  . Number of children: Not on file  . Years of education: Not on file  . Highest education level: Not on file  Occupational History  . Not on file  Tobacco Use  . Smoking status: Current Every Day Smoker    Packs/day: 1.00    Types: Cigarettes  . Smokeless tobacco: Never Used  Substance and Sexual Activity  . Alcohol use: Never  . Drug use: Never  . Sexual activity: Not on file  Other Topics Concern  . Not on file  Social History  Narrative  . Not on file   Social Determinants of Health   Financial Resource Strain:   . Difficulty of Paying Living Expenses:   Food Insecurity:   . Worried About Charity fundraiser in the Last Year:   . Arboriculturist in the Last Year:   Transportation Needs:   . Film/video editor (Medical):   Marland Kitchen Lack of Transportation (Non-Medical):   Physical Activity:   . Days of Exercise per Week:   . Minutes of Exercise per Session:   Stress:   . Feeling of Stress :   Social Connections:   . Frequency of Communication with Friends and Family:   . Frequency of Social Gatherings with Friends and Family:   . Attends Religious Services:   . Active Member  of Clubs or Organizations:   . Attends Archivist Meetings:   Marland Kitchen Marital Status:   Intimate Partner Violence:   . Fear of Current or Ex-Partner:   . Emotionally Abused:   Marland Kitchen Physically Abused:   . Sexually Abused:       Review of Systems  Constitutional: Negative for activity change, chills, fatigue and unexpected weight change.  HENT: Negative for congestion, postnasal drip, rhinorrhea, sneezing and sore throat.   Respiratory: Negative for cough, chest tightness, shortness of breath and wheezing.   Cardiovascular: Negative for chest pain and palpitations.       Blood pressure improved since his last visit.   Gastrointestinal: Negative for abdominal pain, constipation, diarrhea, nausea and vomiting.  Endocrine: Negative for cold intolerance, heat intolerance, polydipsia and polyuria.  Musculoskeletal: Negative for arthralgias, back pain, joint swelling and neck pain.  Skin: Negative for rash.  Allergic/Immunologic: Negative for environmental allergies.  Neurological: Negative for dizziness, tremors, numbness and headaches.  Hematological: Negative for adenopathy. Does not bruise/bleed easily.  Psychiatric/Behavioral: Negative for behavioral problems (Depression), sleep disturbance and suicidal ideas. The patient is nervous/anxious.        Had trouble taking buspirone. Caused negative side effects .    Today's Vitals   01/21/20 1419  BP: (!) 155/96  Pulse: 97  Resp: 16  Temp: 97.8 F (36.6 C)  SpO2: 95%  Weight: 168 lb 9.6 oz (76.5 kg)  Height: 5\' 6"  (1.676 m)   Body mass index is 27.21 kg/m.  Physical Exam Vitals and nursing note reviewed.  Constitutional:      General: He is not in acute distress.    Appearance: Normal appearance. He is well-developed. He is not diaphoretic.  HENT:     Head: Normocephalic and atraumatic.     Nose: Nose normal.     Mouth/Throat:     Pharynx: No oropharyngeal exudate.  Eyes:     Pupils: Pupils are equal, round, and  reactive to light.  Neck:     Thyroid: No thyromegaly.     Vascular: No carotid bruit or JVD.     Trachea: No tracheal deviation.  Cardiovascular:     Rate and Rhythm: Normal rate and regular rhythm.     Heart sounds: Normal heart sounds. No murmur. No friction rub. No gallop.   Pulmonary:     Effort: Pulmonary effort is normal. No respiratory distress.     Breath sounds: Normal breath sounds. No wheezing or rales.     Comments: Mildly congested breath sounds throughout the lung fields . Chest:     Chest wall: No tenderness.  Abdominal:     General: Bowel sounds are normal.  Palpations: Abdomen is soft.     Tenderness: There is no abdominal tenderness.  Musculoskeletal:        General: Normal range of motion.     Cervical back: Normal range of motion and neck supple.  Lymphadenopathy:     Cervical: No cervical adenopathy.  Skin:    General: Skin is warm and dry.  Neurological:     Mental Status: He is alert and oriented to person, place, and time.     Cranial Nerves: No cranial nerve deficit.  Psychiatric:        Attention and Perception: Attention and perception normal.        Mood and Affect: Affect normal. Mood is anxious.        Speech: Speech normal.        Behavior: Behavior normal. Behavior is cooperative.        Thought Content: Thought content normal.        Cognition and Memory: Cognition normal.        Judgment: Judgment normal.    Assessment/Plan: 1. Abnormal liver function Will get ultrasound of abdomen for further evaluation.  - US Abdomen Complete; Future  2. History of hepatitis C Check Hepatitis C viral load as patient has been treated, previously, for hepatitis C. Refer to hepatology as indicated.   3. Acquired hypothyroidism Elevation of Free T on labs. Reduce levothyroxine to daily. Recheck in 2 to 3 months and adjust as indicated.  - levothyroxine (SYNTHROID) 112 MCG tablet; Take 1 tablet (112 mcg total) by mouth daily before breakfast.   Dispense: 30 tablet; Refill: 3  4. Generalized anxiety disorder Patient unable to tolerate buspirone. Will consider other therapies for acute anxiety.   General Counseling: vitali seibert understanding of the findings of todays visit and agrees with plan of treatment. I have discussed any further diagnostic evaluation that may be needed or ordered today. We also reviewed his medications today. he has been encouraged to call the office with any questions or concerns that should arise related to todays visit.   This patient was seen by Vincent Gros FNP Collaboration with Dr Lyndon Code as a part of collaborative care agreement  Orders Placed This Encounter  Procedures  . US Abdomen Complete    Meds ordered this encounter  Medications  . levothyroxine (SYNTHROID) 112 MCG tablet    Sig: Take 1 tablet (112 mcg total) by mouth daily before breakfast.    Dispense:  30 tablet    Refill:  3    Order Specific Question:   Supervising Provider    Answer:   Lyndon Code [[id number]]    Total time spent: 30 Minutes   Time spent includes review of chart, medications, test results, and follow up plan with the patient.      Dr Lyndon Code Internal medicine

## 2020-01-23 ENCOUNTER — Other Ambulatory Visit: Payer: Self-pay | Admitting: Cardiology

## 2020-01-23 DIAGNOSIS — I480 Paroxysmal atrial fibrillation: Secondary | ICD-10-CM

## 2020-01-30 DIAGNOSIS — E039 Hypothyroidism, unspecified: Secondary | ICD-10-CM | POA: Insufficient documentation

## 2020-01-30 DIAGNOSIS — R945 Abnormal results of liver function studies: Secondary | ICD-10-CM | POA: Insufficient documentation

## 2020-01-30 DIAGNOSIS — Z8619 Personal history of other infectious and parasitic diseases: Secondary | ICD-10-CM | POA: Insufficient documentation

## 2020-01-31 ENCOUNTER — Other Ambulatory Visit: Payer: Medicaid Other

## 2020-02-05 ENCOUNTER — Ambulatory Visit (INDEPENDENT_AMBULATORY_CARE_PROVIDER_SITE_OTHER): Payer: Medicaid Other

## 2020-02-05 ENCOUNTER — Other Ambulatory Visit: Payer: Self-pay

## 2020-02-05 DIAGNOSIS — I480 Paroxysmal atrial fibrillation: Secondary | ICD-10-CM

## 2020-02-08 ENCOUNTER — Ambulatory Visit (INDEPENDENT_AMBULATORY_CARE_PROVIDER_SITE_OTHER): Payer: Medicaid Other | Admitting: Cardiology

## 2020-02-08 ENCOUNTER — Other Ambulatory Visit: Payer: Self-pay

## 2020-02-08 ENCOUNTER — Encounter: Payer: Self-pay | Admitting: Cardiology

## 2020-02-08 VITALS — BP 156/82 | HR 59 | Ht 66.0 in | Wt 166.5 lb

## 2020-02-08 DIAGNOSIS — F17200 Nicotine dependence, unspecified, uncomplicated: Secondary | ICD-10-CM | POA: Diagnosis not present

## 2020-02-08 DIAGNOSIS — I2510 Atherosclerotic heart disease of native coronary artery without angina pectoris: Secondary | ICD-10-CM

## 2020-02-08 DIAGNOSIS — I10 Essential (primary) hypertension: Secondary | ICD-10-CM

## 2020-02-08 DIAGNOSIS — I480 Paroxysmal atrial fibrillation: Secondary | ICD-10-CM | POA: Diagnosis not present

## 2020-02-08 MED ORDER — HYDROCHLOROTHIAZIDE 25 MG PO TABS: 25.0000 mg | ORAL_TABLET | Freq: Every day | ORAL | 2 refills | Status: DC

## 2020-02-08 NOTE — Patient Instructions (Signed)
Medication Instructions:  Your physician has recommended you make the following change in your medication:  1- INCREASE Hydrochlorothiazide to 25 mg by mouth once a day.  *If you need a refill on your cardiac medications before your next appointment, please call your pharmacy*  Follow-Up: At [facility], you and your health needs are our priority.  As part of our continuing mission to provide you with exceptional heart care, we have created designated Provider Care Teams.  These Care Teams include your primary Cardiologist (physician) and Advanced Practice Providers (APPs -  Physician Assistants and Nurse Practitioners) who all work together to provide you with the care you need, when you need it.  We recommend signing up for the patient portal called "MyChart".  Sign up information is provided on this After Visit Summary.  MyChart is used to connect with patients for Virtual Visits (Telemedicine).  Patients are able to view lab/test results, encounter notes, upcoming appointments, etc.  Non-urgent messages can be sent to your provider as well.   To learn more about what you can do with MyChart, go to [email or web address].    Your next appointment:   6 month(s)  The format for your next appointment:   In Person  Provider:   Debbe Odea, MD

## 2020-02-08 NOTE — Progress Notes (Signed)
Cardiology Office Note:    Date:  02/08/2020   ID:  Gabriel Hoffman, DOB 1956/05/31, MRN [id number]  PCP:  Carlean Jews, NP  Cardiologist:  No primary care provider on file.  Electrophysiologist:  None   Referring MD: Carlean Jews, NP   Chief Complaint  Patient presents with  . office visit    Pt has no complaints. Meds verbally reviewed w/ pt.     History of Present Illness:    Gabriel Hoffman is a 64 y.o. 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  Patient was last seen due to history of CAD and hypertension.  Echocardiogram was ordered due to history of CAD.  He has no chest pain or shortness of breath.  Blood pressure was elevated at last visit, patient takes HCTZ 12.5 mg daily  Historical notes Patient states having a heart attack in February 2020 while he was incarcerated.  He was taken to [facility] in [facility].  He states being there for 1 week, no procedures were performed and he was placed on meds.  In September of he states being taken to [facility] where he was diagnosed with atrial fibrillation.  He had an ablation and was started on Xarelto.  He recently followed up with a primary care provider in the area to establish care.  His blood pressures were elevated and he was started on hydrochlorothiazide 12.5 mg daily.  He is a current smoker and is working on quitting.    Past Medical History:  Diagnosis Date  . Hypertension   . Thyroid disease     Past Surgical History:  Procedure Laterality Date  . CARDIAC CATHETERIZATION    . CATARACT EXTRACTION    . LEG SURGERY Right    rod in leg  . WRIST SURGERY Left     Current Medications: Current Meds  Medication Sig  . atorvastatin (LIPITOR) 10 MG tablet Take 10 mg by mouth daily.  . hydrochlorothiazide (HYDRODIURIL) 25 MG tablet Take 1  tablet (25 mg total) by mouth daily.  Marland Kitchen levothyroxine (SYNTHROID) 112 MCG tablet Take 1 tablet (112 mcg total) by mouth daily before breakfast.  . metoprolol succinate (TOPROL-XL) 100 MG 24 hr tablet Take 100 mg by mouth daily. Take with or immediately following a meal.  . nitroGLYCERIN (NITRODUR - DOSED IN MG/24 HR) 0.4 mg/hr patch Place 0.4 mg onto the skin daily.  . pantoprazole (PROTONIX) 20 MG tablet Take 20 mg by mouth daily.  . rivaroxaban (XARELTO) 20 MG TABS tablet Take 20 mg by mouth daily with supper.  . [DISCONTINUED] hydrochlorothiazide (HYDRODIURIL) 12.5 MG tablet Take 1 tablet (12.5 mg total) by mouth daily.     Allergies:   Patient has no allergy information on record.   Social History   Socioeconomic History  . Marital status: Single    Spouse name: Not on file  . Number of children: Not on file  . Years of education: Not on file  . Highest education level: Not on file  Occupational History  . Not on file  Tobacco Use  . Smoking status: Current Every Day Smoker    Packs/day: 1.00    Types: Cigarettes  . Smokeless tobacco: Never Used  Substance and Sexual Activity  . Alcohol use: Never  . Drug use: Never  . Sexual activity: Not on file  Other Topics  Concern  . Not on file  Social History Narrative  . Not on file   Social Determinants of Health   Financial Resource Strain:   . Difficulty of Paying Living Expenses:   Food Insecurity:   . Worried About Programme researcher, broadcasting/film/video in the Last Year:   . Barista in the Last Year:   Transportation Needs:   . Freight forwarder (Medical):   Marland Kitchen Lack of Transportation (Non-Medical):   Physical Activity:   . Days of Exercise per Week:   . Minutes of Exercise per Session:   Stress:   . Feeling of Stress :   Social Connections:   . Frequency of Communication with Friends and Family:   . Frequency of Social Gatherings with Friends and Family:   . Attends Religious Services:   . Active Member of Clubs or  Organizations:   . Attends Banker Meetings:   Marland Kitchen Marital Status:      Family History: The patient's family history includes COPD in his mother; Cancer in his mother and sister.  ROS:   Please see the history of present illness.     All other systems reviewed and are negative.  EKGs/Labs/Other Studies Reviewed:    The following studies were reviewed today:   EKG:  EKG is  ordered today.  The ekg ordered today demonstrates sinus bradycardia, otherwise normal EKG.  Recent Labs: 01/04/2020: ALT 62; BUN 13; Creatinine, Ser 0.88; Hemoglobin 17.2; Platelets 188; Potassium 3.9; Sodium 137; TSH 0.731  Recent Lipid Panel    Component Value Date/Time   CHOL 102 01/04/2020 1310   TRIG 268 (H) 01/04/2020 1310   HDL 23 (L) 01/04/2020 1310   LDLCALC 38 01/04/2020 1310    Physical Exam:    VS:  BP (!) 156/82 (BP Location: Left Arm, Patient Position: Sitting, Cuff Size: Normal)   Pulse (!) 59   Ht 5\' 6"  (1.676 m)   Wt 166 lb 8 oz (75.5 kg)   SpO2 98%   BMI 26.87 kg/m     Wt Readings from Last 3 Encounters:  02/08/20 166 lb 8 oz (75.5 kg)  01/21/20 168 lb 9.6 oz (76.5 kg)  01/10/20 172 lb 4 oz (78.1 kg)     GEN:  Well nourished, well developed in no acute distress HEENT: Normal NECK: No JVD; No carotid bruits LYMPHATICS: No lymphadenopathy CARDIAC: RRR, no murmurs, rubs, gallops RESPIRATORY:  Clear to auscultation without rales ABDOMEN: Soft, non-tender, non-distended MUSCULOSKELETAL:  No edema; No deformity  SKIN: Warm and dry` NEUROLOGIC:  Alert and oriented x 3 PSYCHIATRIC:  Normal affect   ASSESSMENT:    1. Coronary artery disease involving native coronary artery of native heart without angina pectoris   2. PAF (paroxysmal atrial fibrillation) (HCC)   3. Essential hypertension   4. Smoking    PLAN:    In order of problems listed above:  1. Patient states having a history of MI in February 2020.  Currently denies chest pain or shortness of breath.  Not  able to confirm with medical records from outside facility.  Patient currently asymptomatic.  No indication for testing at this time.  Continue statin.  Echocardiogram showed normal systolic function with EF 55 to 60%, impaired relaxation, mild MR. 2. History of atrial fibrillation status post ablation in September 2020 at [facility].  We will try to obtain records to confirm ablation and A. fib history.  He is currently in sinus rhythm.  Continue  Toprol-XL and Xarelto   3. Patient with history of hypertension.  BP is elevated.  Increase HCTZ to 25 mg daily.  4. Patient is a current smoker.  Smoking cessation advised.  Follow-up in 6 months.  This note was generated in part or whole with voice recognition software. Voice recognition is usually quite accurate but there are transcription errors that can and very often do occur. I apologize for any typographical errors that were not detected and corrected.  Medication Adjustments/Labs and Tests Ordered: Current medicines are reviewed at length with the patient today.  Concerns regarding medicines are outlined above.  Orders Placed This Encounter  Procedures  . EKG 12-Lead   Meds ordered this encounter  Medications  . hydrochlorothiazide (HYDRODIURIL) 25 MG tablet    Sig: Take 1 tablet (25 mg total) by mouth daily.    Dispense:  90 tablet    Refill:  2    Patient Instructions  Medication Instructions:  Your physician has recommended you make the following change in your medication:  1- INCREASE Hydrochlorothiazide to 25 mg by mouth once a day.  *If you need a refill on your cardiac medications before your next appointment, please call your pharmacy*  Follow-Up: At [facility], you and your health needs are our priority.  As part of our continuing mission to provide you with exceptional heart care, we have created designated Provider Care Teams.  These Care Teams include your primary Cardiologist (physician) and Advanced Practice  Providers (APPs -  Physician Assistants and Nurse Practitioners) who all work together to provide you with the care you need, when you need it.  We recommend signing up for the patient portal called "MyChart".  Sign up information is provided on this After Visit Summary.  MyChart is used to connect with patients for Virtual Visits (Telemedicine).  Patients are able to view lab/test results, encounter notes, upcoming appointments, etc.  Non-urgent messages can be sent to your provider as well.   To learn more about what you can do with MyChart, go to [email or web address].    Your next appointment:   6 month(s)  The format for your next appointment:   In Person  Provider:   Kate Sable, MD     Signed, Kate Sable, MD  02/08/2020 1:03 PM    [place]

## 2020-02-11 ENCOUNTER — Other Ambulatory Visit: Payer: Self-pay | Admitting: Nurse Practitioner

## 2020-02-12 LAB — HCV RNA QUANT
HCV log10: 6.702 log10 IU/mL
Hepatitis C Quantitation: 5030000 IU/mL

## 2020-02-14 NOTE — Progress Notes (Signed)
Yes. We talked about this at his last visit. Has been diagnosed with this several years ago and was treated while in prison. He was told this was resolved. Will refer him to d. Wohl for another round of treatment. I see him back 02/22/2020

## 2020-02-15 ENCOUNTER — Ambulatory Visit: Payer: Medicaid Other

## 2020-02-15 ENCOUNTER — Encounter: Payer: Medicaid Other | Admitting: Nurse Practitioner

## 2020-02-15 ENCOUNTER — Other Ambulatory Visit: Payer: Self-pay

## 2020-02-15 DIAGNOSIS — R945 Abnormal results of liver function studies: Secondary | ICD-10-CM

## 2020-02-16 NOTE — Procedures (Signed)
[facility] [address] [place], [place] [address]  DATE OF SERVICE: January 18, 2020   CAROTID DOPPLER INTERPRETATION:  Bilateral Carotid Ultrsasound and Color Doppler Examination was performed. The RIGHT CCA shows moderate plaque in the vessel. The LEFT CCA shows mild to moderate plaque in the vessel. There was no intimal thickening noted in the RIGHT carotid artery. There was no intimal thickening in the LEFT carotid artery.  The RIGHT CCA shows peak systolic velocity of 69 cm per second. The end diastolic velocity is 21 cm per second on the RIGHT side. The RIGHT ICA shows peak systolic velocity of 102 per second. RIGHT sided ICA end diastolic velocity is 38 cm per second. The RIGHT ECA shows a peak systolic velocity of 102 cm per second. The ICA/CCA ratio is calculated to be 1.5. This suggests 50 to 60% stenosis. The Vertebral Artery shows antegrade flow.  The LEFT CCA shows peak systolic velocity of 117 cm per second. The end diastolic velocity is 31 cm per second on the LEFT side. The LEFT ICA shows peak systolic velocity of 87 per second. LEFT sided ICA end diastolic velocity is 41 cm per second. The LEFT ECA shows a peak systolic velocity of 96 cm per second. The ICA/CCA ratio is calculated to be 0.7. This suggests less than 50% stenosis. The Vertebral Artery shows antegrade flow.   Impression:    The RIGHT CAROTID shows 50 to 60% stenosis. The LEFT CAROTID shows less than 50% stenosis.  There is moderate plaque formation noted on the LEFT and mild to moderate on the RIGHT  side. Consider a repeat Carotid doppler if clinical situation and symptoms warrant in 6-12 months. Patient should be encouraged to change lifestyles such as smoking cessation, regular exercise and dietary modification. Use of statins in the right clinical setting and ASA is encouraged.  Yevonne Pax, MD [facility] Pulmonary Critical Care Medicine

## 2020-02-18 NOTE — Progress Notes (Signed)
Moderate plaque on right and mild to moderate plaque on left side. There is 50-60% stenosis on right and <50% stenosis on left. Discuss at visit 02/22/2020

## 2020-02-21 NOTE — Progress Notes (Signed)
Hepatomegaly. Review with patient at visit 02/22/2020

## 2020-02-22 ENCOUNTER — Other Ambulatory Visit: Payer: Self-pay

## 2020-02-22 ENCOUNTER — Ambulatory Visit: Payer: Medicaid Other | Admitting: Nurse Practitioner

## 2020-02-22 ENCOUNTER — Encounter: Payer: Self-pay | Admitting: Nurse Practitioner

## 2020-02-22 VITALS — BP 140/77 | HR 66 | Temp 97.6°F | Resp 16 | Ht 66.0 in | Wt 167.0 lb

## 2020-02-22 DIAGNOSIS — K732 Chronic active hepatitis, not elsewhere classified: Secondary | ICD-10-CM | POA: Diagnosis not present

## 2020-02-22 DIAGNOSIS — I10 Essential (primary) hypertension: Secondary | ICD-10-CM

## 2020-02-22 DIAGNOSIS — Z0001 Encounter for general adult medical examination with abnormal findings: Secondary | ICD-10-CM | POA: Diagnosis not present

## 2020-02-22 DIAGNOSIS — R945 Abnormal results of liver function studies: Secondary | ICD-10-CM

## 2020-02-22 DIAGNOSIS — E039 Hypothyroidism, unspecified: Secondary | ICD-10-CM | POA: Diagnosis not present

## 2020-02-22 DIAGNOSIS — R300 Dysuria: Secondary | ICD-10-CM

## 2020-02-22 NOTE — Progress Notes (Signed)
[facility] [address] [place], [place] [address]  Internal MEDICINE  Office Visit Note  Patient Name: Gabriel Hoffman  [id number]  [id number]  Date of Service: 02/25/2020  Chief Complaint  Patient presents with  . Annual Exam    [place] results  . Hypertension  . Medication Refill     The patient is here for health maintenance exam. He recently had repeat labs done. His cholesterol panel showing mild/moderate elevation of triglycerides. LDL and total cholesterol are normal. He takes atorvastatin 10mg  every day. He does see cardiology and has been cleared to go back to work.  Liver enzymes were elevated and hepatitis c antibodies were elevated. He states that he has been told he had hepatitis C in the past. While in prison, he states that he received treatment for this and was also told it was gone. He denies abdominal pain, nausea, vomiting, diarrhea, or cramping. Viral load and further labs studies indicate that he has active hepatitis C. Ultrasound of the abdomen indicates hepatosplenomegaly.  He had carotid doppler study done. He does have mild to moderate plaque on the left and moderate plaque on the right. There is <50% stenosis on left side and 50-60% stenosis on the right.    Pt is here for routine health maintenance examination  Current Medication: Outpatient Encounter Medications as of 02/22/2020  Medication Sig  . atorvastatin (LIPITOR) 10 MG tablet Take 10 mg by mouth daily.  . hydrochlorothiazide (HYDRODIURIL) 25 MG tablet Take 1 tablet (25 mg total) by mouth daily.  02/24/2020 levothyroxine (SYNTHROID) 112 MCG tablet Take 1 tablet (112 mcg total) by mouth daily before breakfast.  . metoprolol succinate (TOPROL-XL) 100 MG 24 hr tablet Take 100 mg by mouth daily. Take with or immediately following a meal.  . nitroGLYCERIN (NITRODUR - DOSED IN MG/24 HR) 0.4 mg/hr patch Place 0.4 mg onto the skin daily.  . pantoprazole (PROTONIX) 20 MG tablet Take 20 mg by mouth daily.   . rivaroxaban (XARELTO) 20 MG TABS tablet Take 20 mg by mouth daily with supper.   No facility-administered encounter medications on file as of 02/22/2020.    Surgical History: Past Surgical History:  Procedure Laterality Date  . CARDIAC CATHETERIZATION    . CATARACT EXTRACTION    . LEG SURGERY Right    rod in leg  . WRIST SURGERY Left     Medical History: Past Medical History:  Diagnosis Date  . Hypertension   . Thyroid disease     Family History: Family History  Problem Relation Age of Onset  . Cancer Mother   . COPD Mother   . Cancer Sister       Review of Systems  Constitutional: Positive for fatigue. Negative for activity change, chills and unexpected weight change.  HENT: Negative for congestion, postnasal drip, rhinorrhea, sneezing and sore throat.   Respiratory: Negative for cough, chest tightness, shortness of breath and wheezing.   Cardiovascular: Negative for chest pain and palpitations.       Blood pressure doing well.   Gastrointestinal: Negative for abdominal pain, constipation, diarrhea, nausea and vomiting.  Endocrine: Negative for cold intolerance, heat intolerance, polydipsia and polyuria.  Genitourinary: Negative for dysuria, frequency, hematuria and urgency.  Musculoskeletal: Negative for arthralgias, back pain, joint swelling and neck pain.  Skin: Negative for rash.  Allergic/Immunologic: Negative for environmental allergies.  Neurological: Negative for dizziness, tremors, numbness and headaches.  Hematological: Negative for adenopathy. Does not bruise/bleed easily.  Psychiatric/Behavioral: Negative for behavioral problems (  Depression), sleep disturbance and suicidal ideas. The patient is nervous/anxious.     Today's Vitals   02/22/20 0911  BP: 140/77  Pulse: 66  Resp: 16  Temp: 97.6 F (36.4 C)  SpO2: 96%  Weight: 167 lb (75.8 kg)  Height: 5\' 6"  (1.676 m)   Body mass index is 26.95 kg/m.  Physical Exam Vitals and nursing note  reviewed.  Constitutional:      General: He is not in acute distress.    Appearance: Normal appearance. He is well-developed. He is not diaphoretic.  HENT:     Head: Normocephalic and atraumatic.     Nose: Nose normal.     Mouth/Throat:     Pharynx: No oropharyngeal exudate.  Eyes:     Pupils: Pupils are equal, round, and reactive to light.  Neck:     Thyroid: No thyromegaly.     Vascular: No carotid bruit or JVD.     Trachea: No tracheal deviation.  Cardiovascular:     Rate and Rhythm: Normal rate and regular rhythm.     Pulses: Normal pulses.     Heart sounds: Normal heart sounds. No murmur. No friction rub. No gallop.   Pulmonary:     Effort: Pulmonary effort is normal. No respiratory distress.     Breath sounds: Normal breath sounds. No wheezing or rales.     Comments: Mildly congested breath sounds throughout the lung fields . Chest:     Chest wall: No tenderness.  Abdominal:     General: Bowel sounds are normal.     Palpations: Abdomen is soft.     Tenderness: There is no abdominal tenderness.     Comments: Mild hepatomegaly with palpation of the abdomen.   Musculoskeletal:        General: Normal range of motion.     Cervical back: Normal range of motion and neck supple.  Lymphadenopathy:     Cervical: No cervical adenopathy.  Skin:    General: Skin is warm and dry.  Neurological:     General: No focal deficit present.     Mental Status: He is alert and oriented to person, place, and time.     Cranial Nerves: No cranial nerve deficit.  Psychiatric:        Attention and Perception: Attention and perception normal.        Mood and Affect: Affect normal. Mood is anxious.        Speech: Speech normal.        Behavior: Behavior normal. Behavior is cooperative.        Thought Content: Thought content normal.        Cognition and Memory: Cognition normal.        Judgment: Judgment normal.    Depression screen [facility] 2/9 02/22/2020 01/04/2020  Decreased Interest 0 0   Down, Depressed, Hopeless 0 0  PHQ - 2 Score 0 0    Functional Status Survey: Is the patient deaf or have difficulty hearing?: No Does the patient have difficulty seeing, even when wearing glasses/contacts?: No Does the patient have difficulty concentrating, remembering, or making decisions?: No Does the patient have difficulty walking or climbing stairs?: Yes Does the patient have difficulty dressing or bathing?: No Does the patient have difficulty doing errands alone such as visiting a doctor's office or shopping?: No  MMSE - Mini Mental State Exam 02/25/2020  Orientation to time 5  Orientation to Place 5  Registration 3  Attention/ Calculation 5  Recall 3  Language-  name 2 objects 2  Language- repeat 1  Language- follow 3 step command 3  Language- read & follow direction 1  Write a sentence 1  Copy design 1  Total score 30    Fall Risk  02/22/2020 01/04/2020  Falls in the past year? 0 1  Number falls in past yr: - 0  Injury with Fall? - 0      LABS: Recent Results (from the past 2160 hour(s))  Comprehensive metabolic panel     Status: Abnormal   Collection Time: 01/04/20  1:10 PM  Result Value Ref Range   Glucose 102 (H) 65 - 99 mg/dL   BUN 13 8 - 27 mg/dL   Creatinine, Ser 0.88 0.76 - 1.27 mg/dL   GFR calc non Af Amer 93 >59 mL/min/1.73   GFR calc Af Amer 107 >59 mL/min/1.73   BUN/Creatinine Ratio 15 10 - 24   Sodium 137 134 - 144 mmol/L   Potassium 3.9 3.5 - 5.2 mmol/L   Chloride 102 96 - 106 mmol/L   CO2 23 20 - 29 mmol/L   Calcium 8.9 8.6 - 10.2 mg/dL   Total Protein 7.1 6.0 - 8.5 g/dL   Albumin 3.5 (L) 3.8 - 4.8 g/dL   Globulin, Total 3.6 1.5 - 4.5 g/dL   Albumin/Globulin Ratio 1.0 (L) 1.2 - 2.2   Bilirubin Total 1.1 0.0 - 1.2 mg/dL   Alkaline Phosphatase 140 (H) 39 - 117 IU/L   AST 64 (H) 0 - 40 IU/L   ALT 62 (H) 0 - 44 IU/L  CBC     Status: Abnormal   Collection Time: 01/04/20  1:10 PM  Result Value Ref Range   WBC 8.4 3.4 - 10.8 x10E3/uL   RBC  5.08 4.14 - 5.80 x10E6/uL   Hemoglobin 17.2 13.0 - 17.7 g/dL   Hematocrit 47.5 37.5 - 51.0 %   MCV 94 79 - 97 fL   MCH 33.9 (H) 26.6 - 33.0 pg   MCHC 36.2 (H) 31.5 - 35.7 g/dL   RDW 12.9 11.6 - 15.4 %   Platelets 188 150 - 450 x10E3/uL  Lipid Panel w/o Chol/HDL Ratio     Status: Abnormal   Collection Time: 01/04/20  1:10 PM  Result Value Ref Range   Cholesterol, Total 102 100 - 199 mg/dL   Triglycerides 268 (H) 0 - 149 mg/dL   HDL 23 (L) >39 mg/dL   VLDL Cholesterol Cal 41 (H) 5 - 40 mg/dL   LDL Chol Calc (NIH) 38 0 - 99 mg/dL  T4, free     Status: Abnormal   Collection Time: 01/04/20  1:10 PM  Result Value Ref Range   Free T4 1.86 (H) 0.82 - 1.77 ng/dL  TSH     Status: None   Collection Time: 01/04/20  1:10 PM  Result Value Ref Range   TSH 0.731 0.450 - 4.500 uIU/mL  PSA     Status: None   Collection Time: 01/04/20  1:10 PM  Result Value Ref Range   Prostate Specific Ag, Serum 1.1 0.0 - 4.0 ng/mL    Comment: Roche ECLIA methodology. According to the American Urological Association, Serum PSA should decrease and remain at undetectable levels after radical prostatectomy. The AUA defines biochemical recurrence as an initial PSA value 0.2 ng/mL or greater followed by a subsequent confirmatory PSA value 0.2 ng/mL or greater. Values obtained with different assay methods or kits cannot be used interchangeably. Results cannot be interpreted as absolute evidence of the presence  or absence of malignant disease.   Hepatitis c antibody (reflex)     Status: Abnormal   Collection Time: 01/04/20  1:10 PM  Result Value Ref Range   HCV Ab >11.0 (H) 0.0 - 0.9 s/co ratio  Comment2 - Hep panel     Status: None   Collection Time: 01/04/20  1:10 PM  Result Value Ref Range   COMMENT HCV-2 Comment     Comment: Strong reactive antibody screen (s/c ratio >10.9) is consistent with past or present HCV infection.  Follow-up testing by HCV, Quantitative, Real time PCR (#550080) is recommended to  determine viral load/diagnosis of current HCV infection.   HCV RNA quant     Status: None   Collection Time: 02/11/20  9:20 AM  Result Value Ref Range   Hepatitis C Quantitation 5,030,000 IU/mL   HCV log10 6.702 log10 IU/mL   Test Information Comment     Comment: The quantitative range of this assay is 15 IU/mL to 100 million IU/mL.  Urinalysis, Routine w reflex microscopic     Status: None   Collection Time: 02/22/20  9:00 AM  Result Value Ref Range   Specific Gravity, UA 1.015 1.005 - 1.030   pH, UA 6.5 5.0 - 7.5   Color, UA Yellow Yellow   Appearance Ur Clear Clear   Leukocytes,UA Negative Negative   Protein,UA Negative Negative/Trace   Glucose, UA Negative Negative   Ketones, UA Negative Negative   RBC, UA Negative Negative   Bilirubin, UA Negative Negative   Urobilinogen, Ur 1.0 0.2 - 1.0 mg/dL   Nitrite, UA Negative Negative   Microscopic Examination Comment     Comment: Microscopic not indicated and not performed.   Assessment/Plan: 1. Encounter for general adult medical examination with abnormal findings Annual health maintenance exam today.   2. Abnormal liver function Abnormal liver functions with positive Hepatitis C. Refer to Dr. Servando Snare for further evaluation.  - Ambulatory referral to Gastroenterology  3. Chronic active hepatitis (HCC) Positive for hepatitis C. Refer to Dr. Servando Snare for further evaluation and treatment.  - Ambulatory referral to Gastroenterology  4. Essential hypertension Stable. Continue BP medication as prescribed.   5. Acquired hypothyroidism Stable. Thyroid panel. Continue levothyroxine as prescribed   6. Dysuria - Urinalysis, Routine w reflex microscopic  General Counseling: gauge winski understanding of the findings of todays visit and agrees with plan of treatment. I have discussed any further diagnostic evaluation that may be needed or ordered today. We also reviewed his medications today. he has been encouraged to call the office  with any questions or concerns that should arise related to todays visit.    Counseling:  This patient was seen by Vincent Gros FNP Collaboration with Dr Lyndon Code as a part of collaborative care agreement  Orders Placed This Encounter  Procedures  . Urinalysis, Routine w reflex microscopic  . Ambulatory referral to Gastroenterology    Total time spent: 45 Minutes  Time spent includes review of chart, medications, test results, and follow up plan with the patient.     Lyndon Code, MD  Internal Medicine

## 2020-02-23 LAB — URINALYSIS, ROUTINE W REFLEX MICROSCOPIC
Bilirubin, UA: NEGATIVE
Glucose, UA: NEGATIVE
Ketones, UA: NEGATIVE
Leukocytes,UA: NEGATIVE
Nitrite, UA: NEGATIVE
Protein,UA: NEGATIVE
RBC, UA: NEGATIVE
Specific Gravity, UA: 1.015 (ref 1.005–1.030)
Urobilinogen, Ur: 1 mg/dL (ref 0.2–1.0)
pH, UA: 6.5 (ref 5.0–7.5)

## 2020-02-25 DIAGNOSIS — Z0001 Encounter for general adult medical examination with abnormal findings: Secondary | ICD-10-CM | POA: Insufficient documentation

## 2020-02-25 DIAGNOSIS — R300 Dysuria: Secondary | ICD-10-CM | POA: Insufficient documentation

## 2020-02-25 DIAGNOSIS — K732 Chronic active hepatitis, not elsewhere classified: Secondary | ICD-10-CM | POA: Insufficient documentation

## 2020-02-25 DIAGNOSIS — I5189 Other ill-defined heart diseases: Secondary | ICD-10-CM

## 2020-02-25 HISTORY — DX: Other ill-defined heart diseases: I51.89

## 2020-03-19 ENCOUNTER — Other Ambulatory Visit: Payer: Self-pay

## 2020-03-19 MED ORDER — METOPROLOL SUCCINATE ER 100 MG PO TB24: 100.0000 mg | ORAL_TABLET | Freq: Every day | ORAL | 3 refills | Status: DC

## 2020-03-21 ENCOUNTER — Encounter: Payer: Self-pay | Admitting: Nurse Practitioner

## 2020-03-21 ENCOUNTER — Ambulatory Visit: Payer: Medicaid Other | Admitting: Nurse Practitioner

## 2020-03-21 ENCOUNTER — Ambulatory Visit
Admission: RE | Admit: 2020-03-21 | Discharge: 2020-03-21 | Disposition: A | Discharge status: Home / Self Care | Payer: Medicaid Other | Source: Ambulatory Visit | Attending: Nurse Practitioner | Admitting: Nurse Practitioner

## 2020-03-21 ENCOUNTER — Other Ambulatory Visit: Payer: Self-pay

## 2020-03-21 VITALS — BP 114/72 | HR 77 | Temp 97.8°F | Resp 16 | Ht 66.0 in | Wt 169.8 lb

## 2020-03-21 DIAGNOSIS — M25561 Pain in right knee: Secondary | ICD-10-CM

## 2020-03-21 IMAGING — CR DG KNEE COMPLETE 4+V*R*
1 series · 4 of 4 positions shown · non-contrast
Comparison: None.

CLINICAL DATA: Right knee pain.

EXAM:
RIGHT KNEE - COMPLETE 4+ VIEW

[Series 1: dg knee complete 4 views right · 0.14mm/px · 4 of 4 slices shown]
[im 1/4]
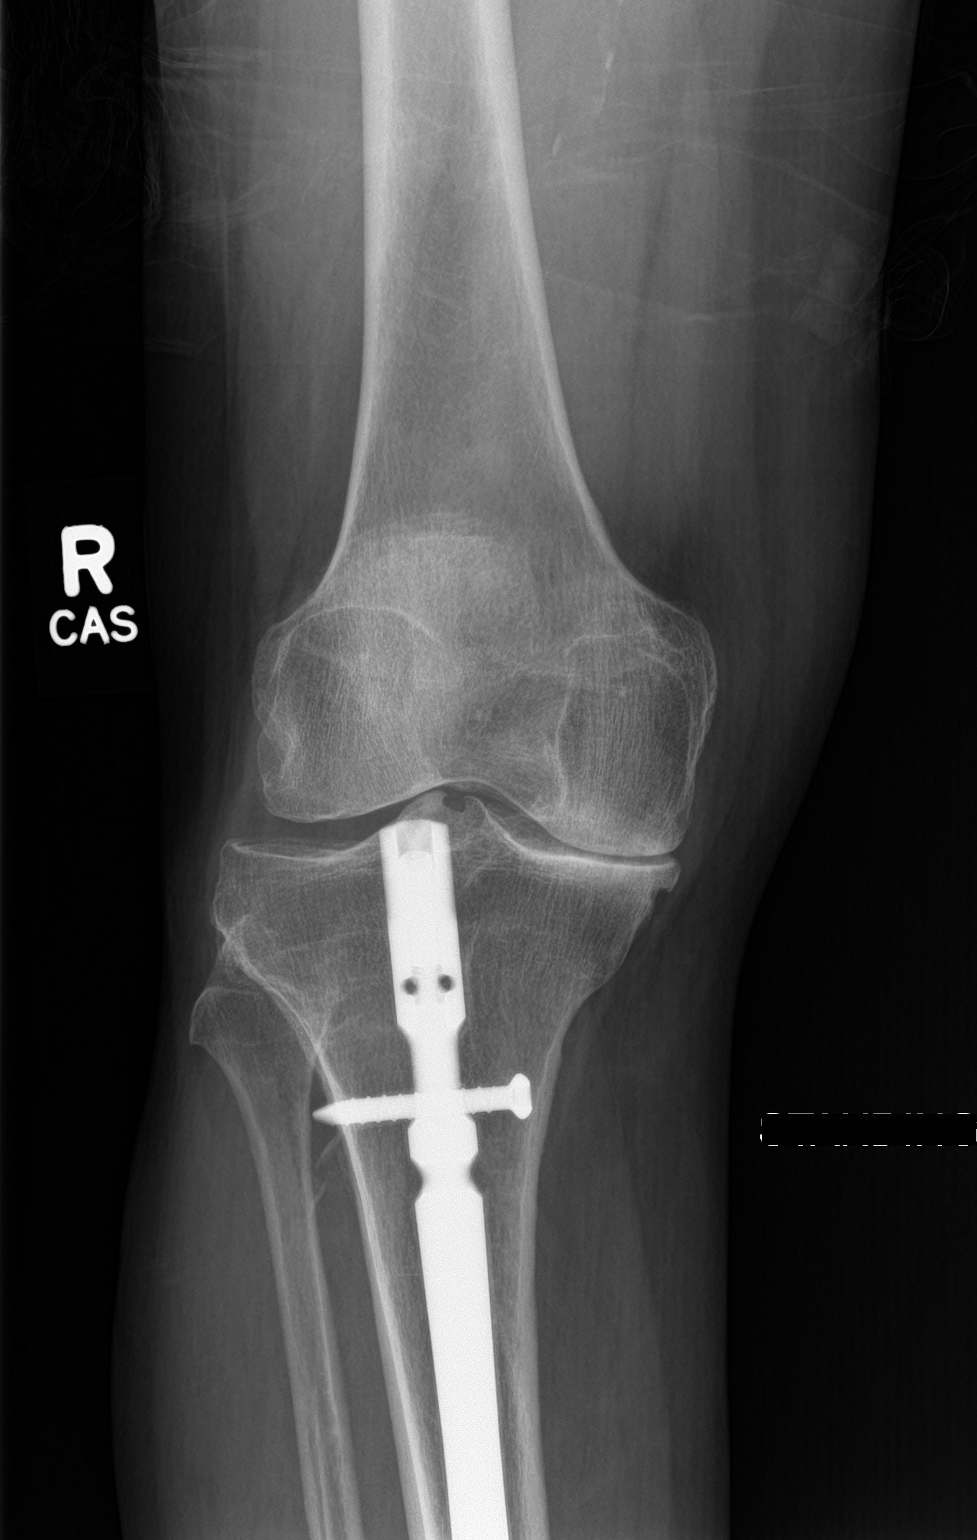
[im 2/4]
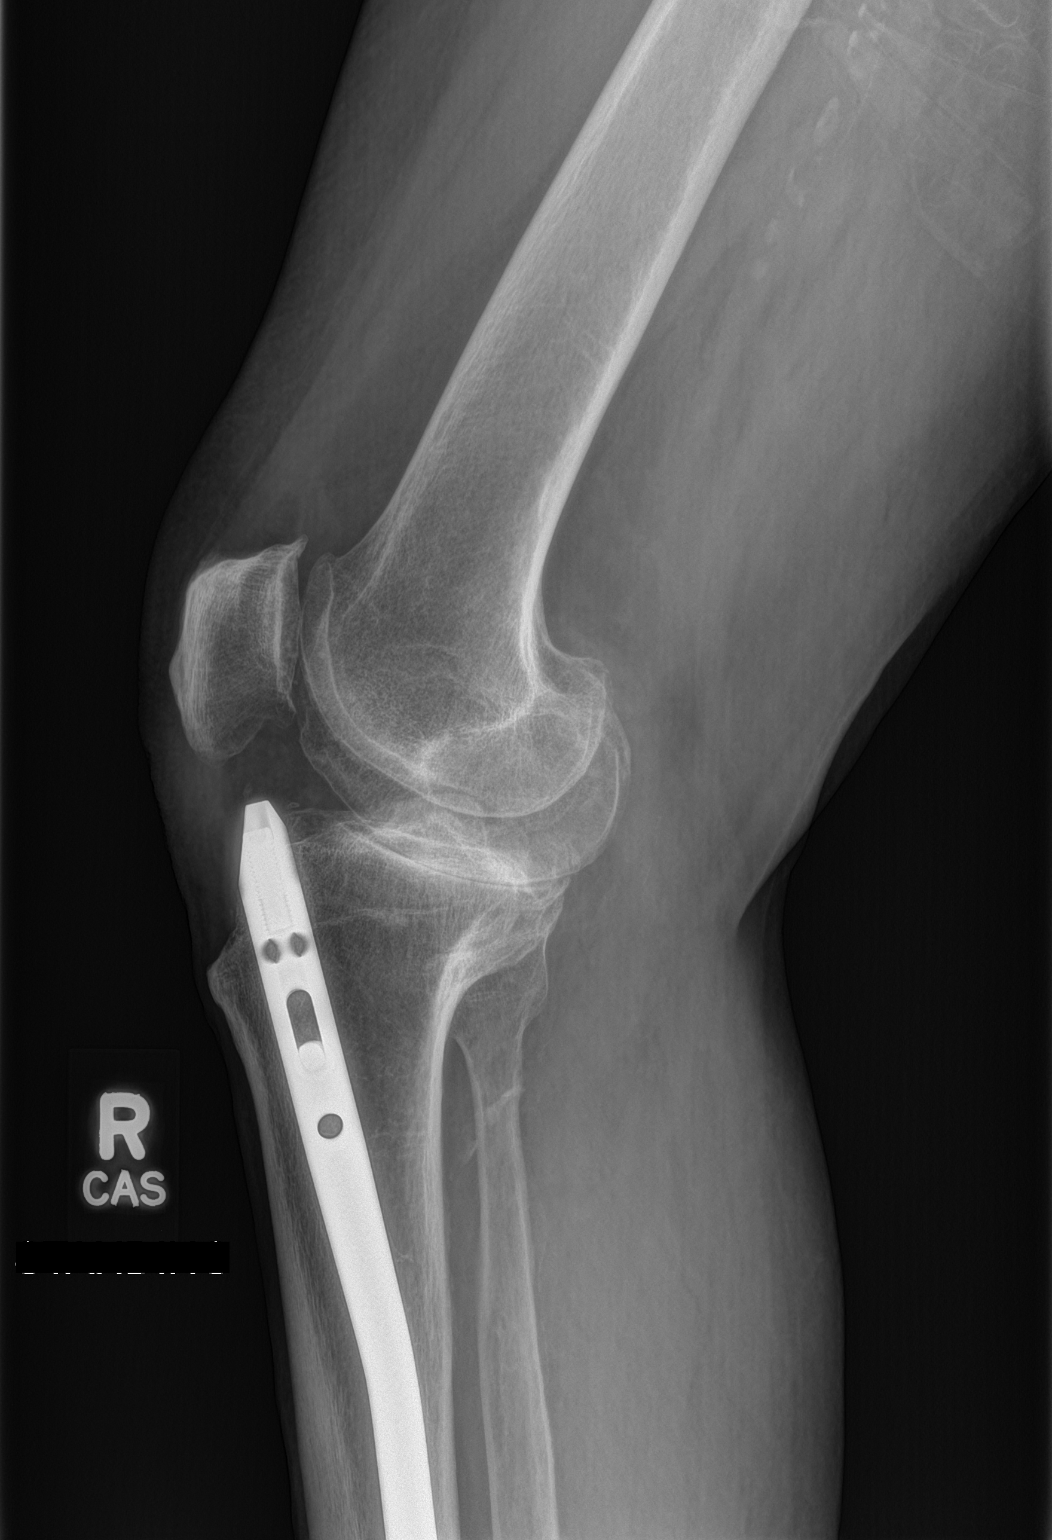
[im 3/4]
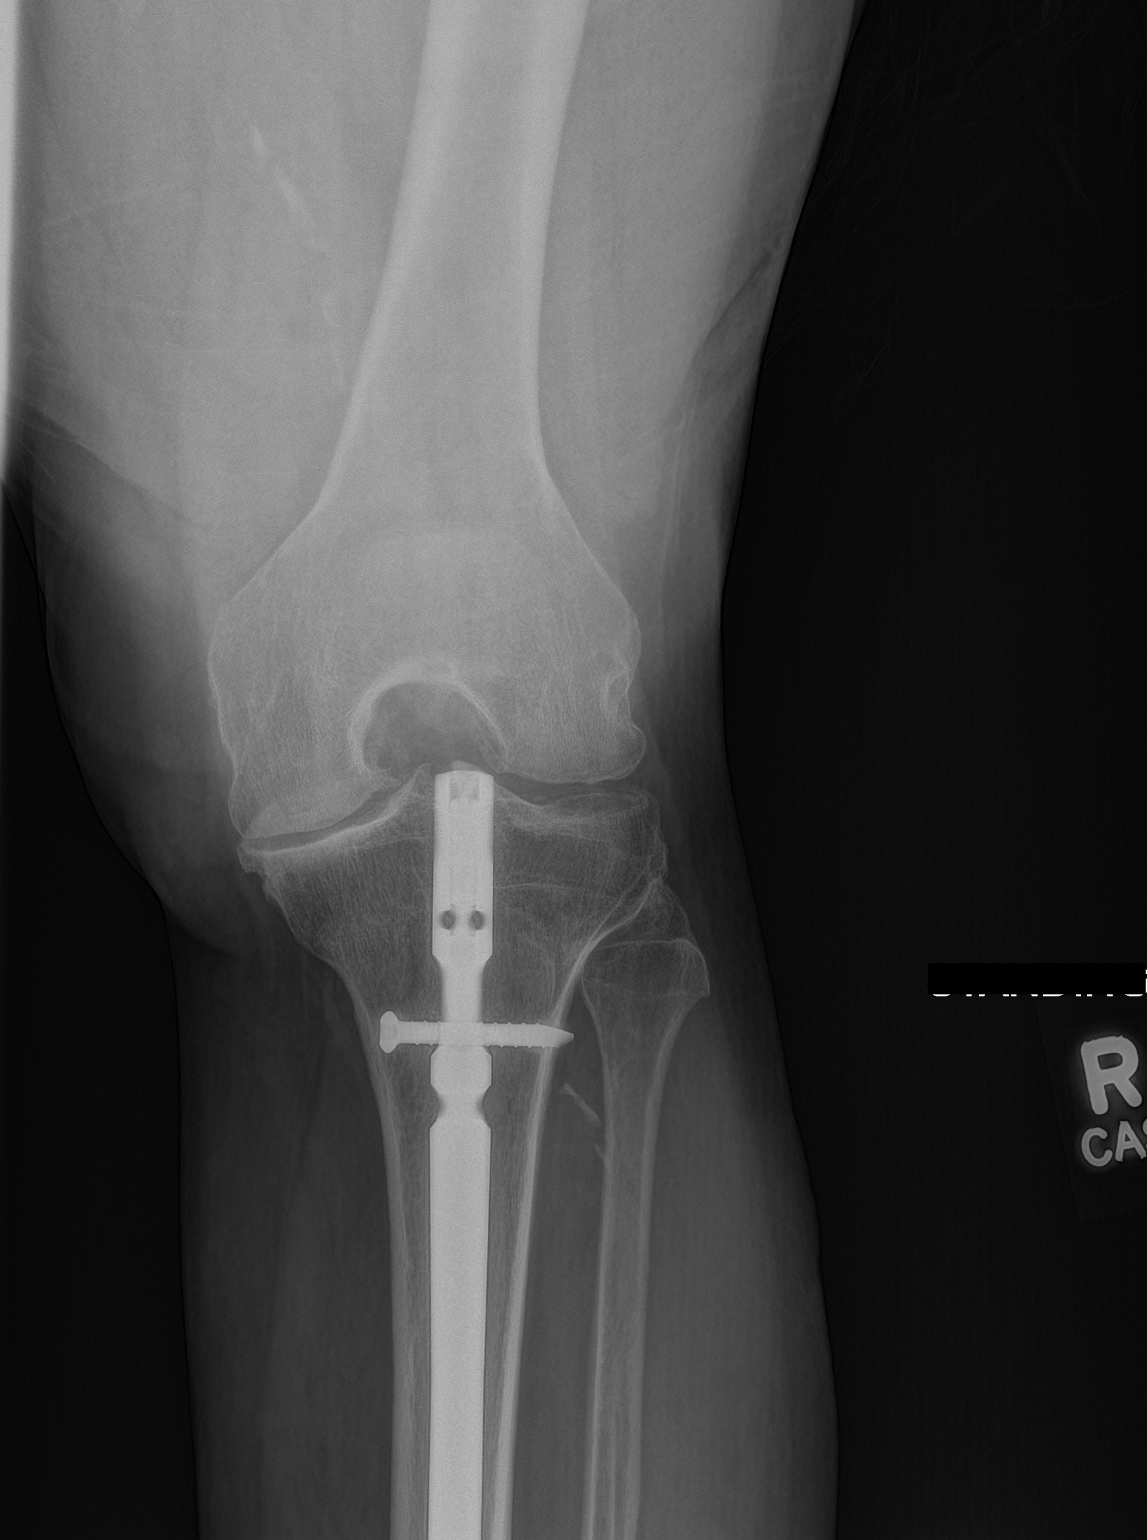
[im 4/4]
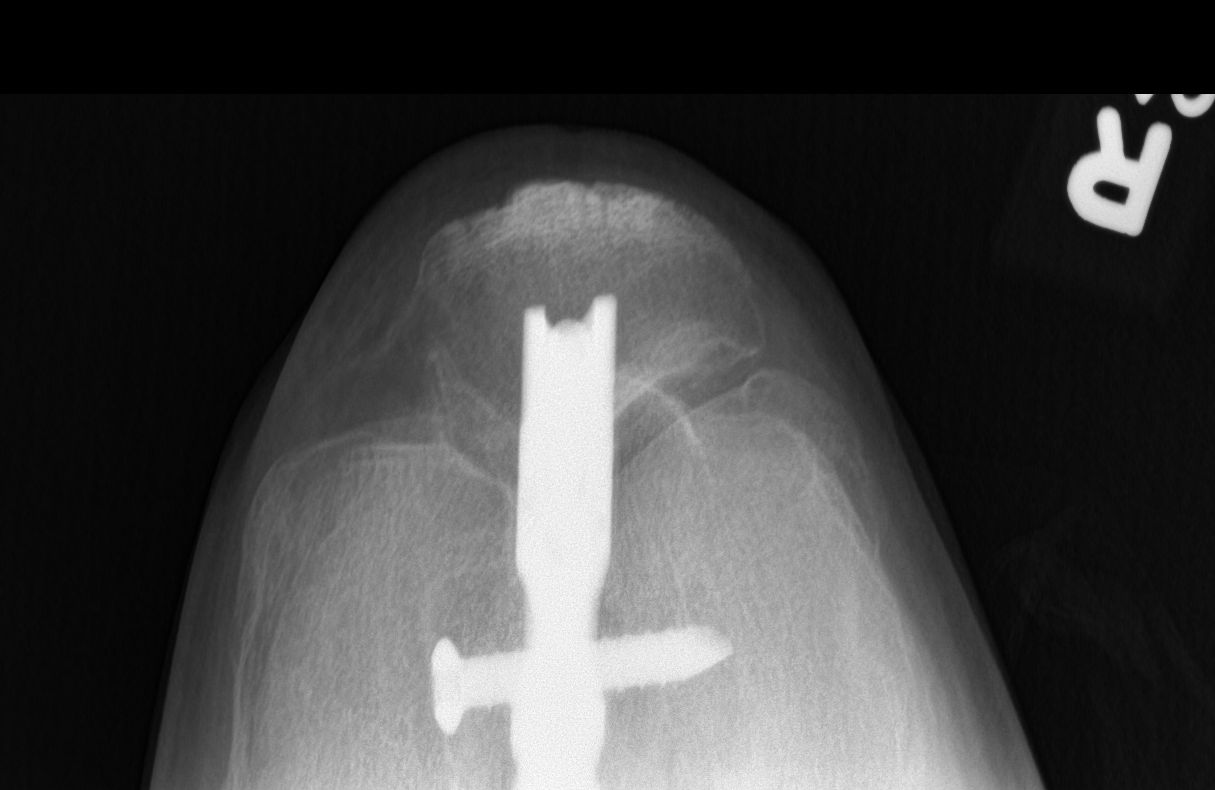

[4 of 4 positions shown; findings below may reference images not displayed]

FINDINGS: No evidence of an acute fracture or dislocation. A radiopaque
intramedullary rod and fixation screw are seen within the proximal
right tibia. There is marked severity narrowing of the medial
tibiofemoral compartment space. A very small joint effusion is
noted.
IMPRESSION: 1. Marked severity degenerative changes involving the medial
tibiofemoral compartment.
2. Very small joint effusion.

## 2020-03-21 MED ORDER — HYDROCODONE-ACETAMINOPHEN 5-325 MG PO TABS: 1.0000 | ORAL_TABLET | Freq: Four times a day (QID) | ORAL | 0 refills | Status: DC | PRN

## 2020-03-21 NOTE — Progress Notes (Signed)
[facility] [place], [place] [address]  Internal MEDICINE  Office Visit Note  Patient Name: Gabriel Hoffman  [id number]  [id number]  Date of Service: 03/21/2020   Pt is here for a sick visit.  Chief Complaint  Patient presents with  . Knee Pain    twisted right knee, swollen and painful, happened wednesday, has rod in the right leg and broke leg bone before      The patient is here for sick visit. He states that he twisted his right knee on Wednesday. It is very swollen. Hurts to bend or straighten his right knee. He did fracture this kee back in 2009. Resulted in rod in his knee which goes down to his ankle. States that he has not had trouble with it since then. Wednesday, he twisted the knee and heard a pop. Very, very tender. Difficult to walk due to pain. He rates pain as 10/10 in severity. Has been taking BC and ibuprofen and BC powder not working.        Current Medication:  Outpatient Encounter Medications as of 03/21/2020  Medication Sig  . atorvastatin (LIPITOR) 10 MG tablet Take 10 mg by mouth daily.  . hydrochlorothiazide (HYDRODIURIL) 25 MG tablet Take 1 tablet (25 mg total) by mouth daily.  Marland Kitchen levothyroxine (SYNTHROID) 112 MCG tablet Take 1 tablet (112 mcg total) by mouth daily before breakfast.  . metoprolol succinate (TOPROL-XL) 100 MG 24 hr tablet Take 1 tablet (100 mg total) by mouth daily. Take with or immediately following a meal.  . nitroGLYCERIN (NITRODUR - DOSED IN MG/24 HR) 0.4 mg/hr patch Place 0.4 mg onto the skin daily.  . pantoprazole (PROTONIX) 20 MG tablet Take 20 mg by mouth daily.  . rivaroxaban (XARELTO) 20 MG TABS tablet Take 20 mg by mouth daily with supper.  Marland Kitchen HYDROcodone-acetaminophen (NORCO/VICODIN) 5-325 MG tablet Take 1 tablet by mouth every 6 (six) hours as needed for moderate pain.   No facility-administered encounter medications on file as of 03/21/2020.      Medical History: Past Medical History:    Diagnosis Date  . Hypertension   . Thyroid disease      Today's Vitals   03/21/20 1346  BP: 114/72  Pulse: 77  Resp: 16  Temp: 97.8 F (36.6 C)  SpO2: 97%  Weight: 169 lb 12.8 oz (77 kg)  Height: 5\' 6"  (1.676 m)   Body mass index is 27.41 kg/m.  Review of Systems  Constitutional: Positive for activity change. Negative for chills, fatigue and unexpected weight change.       Patient having trouble with walking and standing due to severe pain and swelling  HENT: Negative for congestion, postnasal drip, rhinorrhea, sneezing and sore throat.   Respiratory: Negative for cough, chest tightness and shortness of breath.   Cardiovascular: Negative for chest pain and palpitations.  Gastrointestinal: Negative for abdominal pain, constipation, diarrhea, nausea and vomiting.  Musculoskeletal: Positive for arthralgias and joint swelling. Negative for back pain and neck pain.       Pain and swelling of right knee.   Skin: Negative for rash.  Allergic/Immunologic: Negative for environmental allergies.  Neurological: Negative for dizziness, tremors, numbness and headaches.  Hematological: Negative for adenopathy. Does not bruise/bleed easily.  Psychiatric/Behavioral: Negative for behavioral problems (Depression), sleep disturbance and suicidal ideas. The patient is not nervous/anxious.     Physical Exam Vitals and nursing note reviewed.  Constitutional:      General: He is not in acute  distress.    Appearance: Normal appearance. He is well-developed. He is not diaphoretic.  HENT:     Head: Normocephalic and atraumatic.     Mouth/Throat:     Pharynx: No oropharyngeal exudate.  Eyes:     Pupils: Pupils are equal, round, and reactive to light.  Neck:     Thyroid: No thyromegaly.     Vascular: No JVD.     Trachea: No tracheal deviation.  Cardiovascular:     Rate and Rhythm: Normal rate and regular rhythm.     Heart sounds: Normal heart sounds. No murmur. No friction rub. No gallop.    Pulmonary:     Effort: Pulmonary effort is normal. No respiratory distress.     Breath sounds: Normal breath sounds. No wheezing or rales.  Chest:     Chest wall: No tenderness.  Abdominal:     Palpations: Abdomen is soft.  Musculoskeletal:        General: Normal range of motion.     Cervical back: Normal range of motion and neck supple.       Legs:  Lymphadenopathy:     Cervical: No cervical adenopathy.  Skin:    General: Skin is warm and dry.  Neurological:     Mental Status: He is alert and oriented to person, place, and time.     Cranial Nerves: No cranial nerve deficit.  Psychiatric:        Behavior: Behavior normal.        Thought Content: Thought content normal.        Judgment: Judgment normal.   Assessment/Plan: 1. Acute pain of right knee Apply a compressive ACE bandage. Rest and elevate the affected painful area.  Apply cold compresses intermittently as needed.  Will get x-ray of right knee for further evaluation. Urgent referral made to orthopedics for further evaluation and treatment. Short term prescription for hydrocodone/APAP 5/325mg  tablets. This may be taken up to four times daily as needed for pain. Advised him against driving or working while taking this medication as it may cause dizziness or drowsiness. He voiced understanding and agreement.  - DG Knee Complete 4 Views Right; Future - HYDROcodone-acetaminophen (NORCO/VICODIN) 5-325 MG tablet; Take 1 tablet by mouth every 6 (six) hours as needed for moderate pain.  Dispense: 30 tablet; Refill: 0 - Ambulatory referral to Orthopedic Surgery  General Counseling: Gabriel Hoffman understanding of the findings of todays visit and agrees with plan of treatment. I have discussed any further diagnostic evaluation that may be needed or ordered today. We also reviewed his medications today. he has been encouraged to call the office with any questions or concerns that should arise related to todays  visit.    Counseling:  Reviewed risks and possible side effects associated with taking opiates, benzodiazepines and other CNS depressants. Combination of these could cause dizziness and drowsiness. Advised patient not to drive or operate machinery when taking these medications, as patient's and other's life can be at risk and will have consequences. Patient verbalized understanding in this matter. Dependence and abuse for these drugs will be monitored closely. A Controlled substance policy and procedure is on file which allows [facility] to order a urine drug screen test at any visit. Patient understands and agrees with the plan   This patient was seen by Vincent Gros FNP Collaboration with Dr Lyndon Code as a part of collaborative care agreement  Orders Placed This Encounter  Procedures  . DG Knee Complete 4 Views Right  .  Ambulatory referral to Orthopedic Surgery    Meds ordered this encounter  Medications  . HYDROcodone-acetaminophen (NORCO/VICODIN) 5-325 MG tablet    Sig: Take 1 tablet by mouth every 6 (six) hours as needed for moderate pain.    Dispense:  30 tablet    Refill:  0    Order Specific Question:   Supervising Provider    Answer:   Lyndon Code [[id number]]    Time spent: 30 Minutes

## 2020-03-24 ENCOUNTER — Telehealth: Payer: Self-pay

## 2020-03-24 NOTE — Telephone Encounter (Signed)
Pt.notified

## 2020-03-24 NOTE — Telephone Encounter (Signed)
Please let him know that x-ray showed Marked severity degenerative changes involving the medial tibiofemoral compartment and small joint effusion. I did refer him to orthopedics. When is his visit?

## 2020-03-24 NOTE — Progress Notes (Signed)
Patient to be notified and was referred to orthopedics

## 2020-04-04 ENCOUNTER — Other Ambulatory Visit: Payer: Self-pay

## 2020-04-04 MED ORDER — RIVAROXABAN 20 MG PO TABS: 20.0000 mg | ORAL_TABLET | Freq: Every day | ORAL | 0 refills | Status: DC

## 2020-04-04 NOTE — Telephone Encounter (Signed)
Spoke with pt that we refills this time your xarelto  Next refill need to call his cardiology for refills

## 2020-04-08 ENCOUNTER — Ambulatory Visit: Payer: Medicaid Other | Admitting: Gastroenterology

## 2020-04-21 ENCOUNTER — Other Ambulatory Visit: Payer: Self-pay

## 2020-04-21 DIAGNOSIS — E039 Hypothyroidism, unspecified: Secondary | ICD-10-CM

## 2020-04-21 MED ORDER — LEVOTHYROXINE SODIUM 112 MCG PO TABS: 112.0000 ug | ORAL_TABLET | Freq: Every day | ORAL | 3 refills | Status: DC

## 2020-04-28 ENCOUNTER — Other Ambulatory Visit: Payer: Self-pay

## 2020-04-28 DIAGNOSIS — E039 Hypothyroidism, unspecified: Secondary | ICD-10-CM

## 2020-04-28 MED ORDER — LEVOTHYROXINE SODIUM 112 MCG PO TABS
112.0000 ug | ORAL_TABLET | Freq: Every day | ORAL | 3 refills | Status: DC
Start: 2020-04-28 — End: 2020-07-22

## 2020-05-09 ENCOUNTER — Telehealth: Payer: Self-pay

## 2020-05-09 ENCOUNTER — Other Ambulatory Visit: Payer: Self-pay

## 2020-05-09 MED ORDER — ATORVASTATIN CALCIUM 10 MG PO TABS: 10.0000 mg | ORAL_TABLET | Freq: Every day | ORAL | 5 refills | Status: DC

## 2020-05-09 NOTE — Telephone Encounter (Signed)
Confirmed appointment on 05/14/2020 and screened for covid.

## 2020-05-12 ENCOUNTER — Other Ambulatory Visit: Payer: Self-pay

## 2020-05-12 ENCOUNTER — Encounter: Payer: Self-pay | Admitting: Nurse Practitioner

## 2020-05-12 ENCOUNTER — Ambulatory Visit: Payer: Medicaid Other | Admitting: Nurse Practitioner

## 2020-05-12 VITALS — BP 143/85 | HR 67 | Temp 98.0°F | Resp 16 | Ht 66.0 in | Wt 168.4 lb

## 2020-05-12 DIAGNOSIS — K732 Chronic active hepatitis, not elsewhere classified: Secondary | ICD-10-CM

## 2020-05-12 DIAGNOSIS — R252 Cramp and spasm: Secondary | ICD-10-CM | POA: Diagnosis not present

## 2020-05-12 DIAGNOSIS — I2510 Atherosclerotic heart disease of native coronary artery without angina pectoris: Secondary | ICD-10-CM

## 2020-05-12 DIAGNOSIS — E782 Mixed hyperlipidemia: Secondary | ICD-10-CM

## 2020-05-12 DIAGNOSIS — I10 Essential (primary) hypertension: Secondary | ICD-10-CM

## 2020-05-12 DIAGNOSIS — I2583 Coronary atherosclerosis due to lipid rich plaque: Secondary | ICD-10-CM

## 2020-05-12 MED ORDER — ATORVASTATIN CALCIUM 10 MG PO TABS: 10.0000 mg | ORAL_TABLET | Freq: Every day | ORAL | 5 refills | Status: DC

## 2020-05-12 NOTE — Progress Notes (Signed)
[facility] [address] [place], [place] [address]  Internal MEDICINE  Office Visit Note  Patient Name: Gabriel Hoffman  [id number]  [id number]  Date of Service: 05/19/2020  Chief Complaint  Patient presents with  . Follow-up  . Leg Problem    Cramping in legs, as well as feet and hands; has been going on for a few weeks  . Quality Metric Gaps    TDAP    The patient is here for routine follow up. He is c/o leg cramps in both lower legs. Has been going on for about two weeks. Does sit outside in the sun quite a bit. Drinks plenty of water. Labs checked 12/2019 and he did have normal electrolytes with elevated liver functions. He is positive for hepatitis C. Patient referred to Dr. Servando Snare 02/22/2020 due to Hepatitis C and elevated liver functions. He states he has not heard from them to make appointment. His blood pressure is mildly elevated. He does see cardiology. Doing well with stable CAD.  Will check cmp, magnesium, and phosphorus levels. If normal, will have patient come back in for ABI and possible arerial ultrasound of bilateral lower extremities.       Current Medication: Outpatient Encounter Medications as of 05/12/2020  Medication Sig  . atorvastatin (LIPITOR) 10 MG tablet Take 1 tablet (10 mg total) by mouth daily.  . hydrochlorothiazide (HYDRODIURIL) 25 MG tablet Take 1 tablet (25 mg total) by mouth daily.  Marland Kitchen HYDROcodone-acetaminophen (NORCO/VICODIN) 5-325 MG tablet Take 1 tablet by mouth every 6 (six) hours as needed for moderate pain.  Marland Kitchen levothyroxine (SYNTHROID) 112 MCG tablet Take 1 tablet (112 mcg total) by mouth daily before breakfast.  . metoprolol succinate (TOPROL-XL) 100 MG 24 hr tablet Take 1 tablet (100 mg total) by mouth daily. Take with or immediately following a meal.  . nitroGLYCERIN (NITRODUR - DOSED IN MG/24 HR) 0.4 mg/hr patch Place 0.4 mg onto the skin daily.  . pantoprazole (PROTONIX) 20 MG tablet Take 20 mg by mouth daily.  . rivaroxaban  (XARELTO) 20 MG TABS tablet Take 1 tablet (20 mg total) by mouth daily with supper.  . [DISCONTINUED] atorvastatin (LIPITOR) 10 MG tablet Take 1 tablet (10 mg total) by mouth daily.   No facility-administered encounter medications on file as of 05/12/2020.    Surgical History: Past Surgical History:  Procedure Laterality Date  . CARDIAC CATHETERIZATION    . CATARACT EXTRACTION    . LEG SURGERY Right    rod in leg  . WRIST SURGERY Left     Medical History: Past Medical History:  Diagnosis Date  . Hypertension   . Thyroid disease     Family History: Family History  Problem Relation Age of Onset  . Cancer Mother   . COPD Mother   . Cancer Sister     Social History   Socioeconomic History  . Marital status: Single    Spouse name: Not on file  . Number of children: Not on file  . Years of education: Not on file  . Highest education level: Not on file  Occupational History  . Not on file  Tobacco Use  . Smoking status: Current Every Day Smoker    Packs/day: 1.00    Types: Cigarettes  . Smokeless tobacco: Never Used  Vaping Use  . Vaping Use: Never used  Substance and Sexual Activity  . Alcohol use: Never  . Drug use: Never  . Sexual activity: Not on file  Other Topics Concern  .  Not on file  Social History Narrative  . Not on file   Social Determinants of Health   Financial Resource Strain:   . Difficulty of Paying Living Expenses:   Food Insecurity:   . Worried About Programme researcher, broadcasting/film/video in the Last Year:   . Barista in the Last Year:   Transportation Needs:   . Freight forwarder (Medical):   Marland Kitchen Lack of Transportation (Non-Medical):   Physical Activity:   . Days of Exercise per Week:   . Minutes of Exercise per Session:   Stress:   . Feeling of Stress :   Social Connections:   . Frequency of Communication with Friends and Family:   . Frequency of Social Gatherings with Friends and Family:   . Attends Religious Services:   . Active  Member of Clubs or Organizations:   . Attends Banker Meetings:   Marland Kitchen Marital Status:   Intimate Partner Violence:   . Fear of Current or Ex-Partner:   . Emotionally Abused:   Marland Kitchen Physically Abused:   . Sexually Abused:       Review of Systems  Constitutional: Positive for activity change. Negative for chills, fatigue and unexpected weight change.       Patient having trouble with walking and standing due to severe pain and swelling in both lower extremities.   HENT: Negative for congestion, postnasal drip, rhinorrhea, sneezing and sore throat.   Respiratory: Negative for cough, chest tightness and shortness of breath.   Cardiovascular: Negative for chest pain and palpitations.  Gastrointestinal: Negative for abdominal pain, constipation, diarrhea, nausea and vomiting.  Endocrine: Negative for cold intolerance, heat intolerance, polydipsia and polyuria.  Musculoskeletal: Positive for arthralgias and joint swelling. Negative for back pain and neck pain.       Having a lot of pain and cramping in both lower extremities. Gaylyn Rong been getting gradually worse over last few weeks.   Skin: Negative for rash.  Allergic/Immunologic: Negative for environmental allergies.  Neurological: Negative for dizziness, tremors, numbness and headaches.  Hematological: Negative for adenopathy. Does not bruise/bleed easily.  Psychiatric/Behavioral: Negative for behavioral problems (Depression), sleep disturbance and suicidal ideas. The patient is nervous/anxious.     Today's Vitals   05/12/20 1548  BP: (!) 143/85  Pulse: 67  Resp: 16  Temp: 98 F (36.7 C)  SpO2: 96%  Weight: 168 lb 6.4 oz (76.4 kg)  Height: 5\' 6"  (1.676 m)   Body mass index is 27.18 kg/m.  Physical Exam Vitals and nursing note reviewed.  Constitutional:      General: He is not in acute distress.    Appearance: Normal appearance. He is well-developed. He is not diaphoretic.  HENT:     Head: Normocephalic and atraumatic.      Nose: Nose normal.     Mouth/Throat:     Pharynx: No oropharyngeal exudate.  Eyes:     Pupils: Pupils are equal, round, and reactive to light.  Neck:     Thyroid: No thyromegaly.     Vascular: No carotid bruit or JVD.     Trachea: No tracheal deviation.  Cardiovascular:     Rate and Rhythm: Normal rate and regular rhythm.     Heart sounds: Normal heart sounds. No murmur heard.  No friction rub. No gallop.      Comments: Patient has moderate varicose veins present in both lower extremities. They are tender to palpate and he has leg cramping present.  Pulmonary:  Effort: Pulmonary effort is normal. No respiratory distress.     Breath sounds: Normal breath sounds. No wheezing or rales.     Comments: Mildly congested breath sounds throughout the lung fields . Chest:     Chest wall: No tenderness.  Abdominal:     General: Bowel sounds are normal.     Palpations: Abdomen is soft.     Tenderness: There is no abdominal tenderness.     Comments: Mild hepatomegaly with palpation of the abdomen.   Musculoskeletal:        General: Normal range of motion.     Cervical back: Normal range of motion and neck supple.  Lymphadenopathy:     Cervical: No cervical adenopathy.  Skin:    General: Skin is warm and dry.  Neurological:     General: No focal deficit present.     Mental Status: He is alert and oriented to person, place, and time.     Cranial Nerves: No cranial nerve deficit.  Psychiatric:        Attention and Perception: Attention and perception normal.        Mood and Affect: Affect normal. Mood is anxious.        Speech: Speech normal.        Behavior: Behavior normal. Behavior is cooperative.        Thought Content: Thought content normal.        Cognition and Memory: Cognition normal.        Judgment: Judgment normal.    Assessment/Plan: 1. Leg cramps Will check CMP, magnesium, and phosphorus levels for further evaluation. If all normal, will have patient come in for  ABI.   2. Mixed hyperlipidemia Continue atorvastatin as prescribed  - atorvastatin (LIPITOR) 10 MG tablet; Take 1 tablet (10 mg total) by mouth daily.  Dispense: 30 tablet; Refill: 5  3. Chronic active hepatitis (HCC) Check liver functions as well as referral to Dr. Servando Snare due to worsening condition.   4. Essential hypertension Stable. Continue bp medication as prescribed   5. Coronary artery disease due to lipid rich plaque Regular visits with cardiology as scheduled.   General Counseling: delmos velaquez understanding of the findings of todays visit and agrees with plan of treatment. I have discussed any further diagnostic evaluation that may be needed or ordered today. We also reviewed his medications today. he has been encouraged to call the office with any questions or concerns that should arise related to todays visit.   This patient was seen by Vincent Gros FNP Collaboration with Dr Lyndon Code as a part of collaborative care agreement  Meds ordered this encounter  Medications  . atorvastatin (LIPITOR) 10 MG tablet    Sig: Take 1 tablet (10 mg total) by mouth daily.    Dispense:  30 tablet    Refill:  5    Order Specific Question:   Supervising Provider    Answer:   Lyndon Code [[id number]]    Total time spent: 30 Minutes   Time spent includes review of chart, medications, test results, and follow up plan with the patient.      Dr Lyndon Code Internal medicine

## 2020-05-13 ENCOUNTER — Other Ambulatory Visit: Payer: Self-pay | Admitting: Nurse Practitioner

## 2020-05-14 LAB — COMPREHENSIVE METABOLIC PANEL
ALT: 130 IU/L — ABNORMAL HIGH (ref 0–44)
AST: 116 IU/L — ABNORMAL HIGH (ref 0–40)
Albumin/Globulin Ratio: 1 — ABNORMAL LOW (ref 1.2–2.2)
Albumin: 3.6 g/dL — ABNORMAL LOW (ref 3.8–4.8)
Alkaline Phosphatase: 127 IU/L — ABNORMAL HIGH (ref 48–121)
BUN/Creatinine Ratio: 17 (ref 10–24)
BUN: 16 mg/dL (ref 8–27)
Bilirubin Total: 1.4 mg/dL — ABNORMAL HIGH (ref 0.0–1.2)
CO2: 27 mmol/L (ref 20–29)
Calcium: 9.1 mg/dL (ref 8.6–10.2)
Chloride: 100 mmol/L (ref 96–106)
Creatinine, Ser: 0.95 mg/dL (ref 0.76–1.27)
GFR calc Af Amer: 99 mL/min/{1.73_m2} (ref >59)
GFR calc non Af Amer: 85 mL/min/{1.73_m2} (ref >59)
Globulin, Total: 3.5 g/dL (ref 1.5–4.5)
Glucose: 94 mg/dL (ref 65–99)
Potassium: 4.1 mmol/L (ref 3.5–5.2)
Sodium: 138 mmol/L (ref 134–144)
Total Protein: 7.1 g/dL (ref 6.0–8.5)

## 2020-05-14 LAB — MAGNESIUM: Magnesium: 2 mg/dL (ref 1.6–2.3)

## 2020-05-14 LAB — PHOSPHORUS: Phosphorus: 3.1 mg/dL (ref 2.8–4.1)

## 2020-05-18 NOTE — Progress Notes (Signed)
Is there some way we can get his appointment with Dr. Servando Snare moved forward? His lever functions are seriously climbing and is starting to have leg cramps and joint pain. Thanks.

## 2020-05-19 DIAGNOSIS — R252 Cramp and spasm: Secondary | ICD-10-CM | POA: Insufficient documentation

## 2020-05-23 ENCOUNTER — Ambulatory Visit: Payer: Medicaid Other | Admitting: Nurse Practitioner

## 2020-05-23 ENCOUNTER — Telehealth: Payer: Self-pay | Admitting: Cardiology

## 2020-05-23 ENCOUNTER — Encounter: Payer: Self-pay | Admitting: Cardiology

## 2020-05-23 NOTE — Telephone Encounter (Signed)
This encounter was created in error - please disregard.

## 2020-05-23 NOTE — Telephone Encounter (Signed)
Received records request Disability Determination Services, forwarded to [employer] for processing.  

## 2020-06-04 ENCOUNTER — Other Ambulatory Visit: Payer: Self-pay | Admitting: Cardiology

## 2020-06-04 MED ORDER — RIVAROXABAN 20 MG PO TABS: 20.0000 mg | ORAL_TABLET | Freq: Every day | ORAL | 1 refills | Status: DC

## 2020-06-04 NOTE — Telephone Encounter (Signed)
Pt's age 64, wt 76.4 kg, SCr 0.95, CrCl 71.49, last ov w/ BA 02/08/20. Xarelto 20 mg refill sent in as requested.

## 2020-06-04 NOTE — Telephone Encounter (Signed)
Refill request

## 2020-06-04 NOTE — Telephone Encounter (Signed)
*  STAT* If patient is at the pharmacy, call can be transferred to refill team.   1. Which medications need to be refilled? (please list name of each medication and dose if known) Xarelto   2. Which pharmacy/location (including street and city if local pharmacy) is medication to be sent to? [facility] on [address]  3. Do they need a 30 day or 90 day supply? 90

## 2020-06-12 ENCOUNTER — Other Ambulatory Visit: Payer: Self-pay

## 2020-06-12 MED ORDER — METOPROLOL SUCCINATE ER 100 MG PO TB24: 100.0000 mg | ORAL_TABLET | Freq: Every day | ORAL | 3 refills | Status: DC

## 2020-06-13 ENCOUNTER — Telehealth: Payer: Self-pay

## 2020-06-13 NOTE — Telephone Encounter (Signed)
Medical record request complete and records mailed to [facility]. Copy of request held at front desk.

## 2020-06-15 DIAGNOSIS — B1920 Unspecified viral hepatitis C without hepatic coma: Secondary | ICD-10-CM

## 2020-06-15 DIAGNOSIS — K7460 Unspecified cirrhosis of liver: Secondary | ICD-10-CM

## 2020-06-15 HISTORY — DX: Unspecified viral hepatitis C without hepatic coma: B19.20

## 2020-06-15 HISTORY — DX: Unspecified cirrhosis of liver: K74.60

## 2020-06-26 ENCOUNTER — Ambulatory Visit (INDEPENDENT_AMBULATORY_CARE_PROVIDER_SITE_OTHER): Payer: Medicaid Other | Admitting: Gastroenterology

## 2020-06-26 ENCOUNTER — Encounter: Payer: Self-pay | Admitting: Gastroenterology

## 2020-06-26 VITALS — BP 156/98 | HR 88 | Temp 97.9°F | Ht 66.0 in | Wt 173.8 lb

## 2020-06-26 DIAGNOSIS — Z8619 Personal history of other infectious and parasitic diseases: Secondary | ICD-10-CM

## 2020-06-26 NOTE — Progress Notes (Signed)
Gastroenterology Consultation  Referring Provider:     Carlean Jews, NP Primary Care Physician:  Gabriel Jews, NP Primary Gastroenterologist:  Gabriel Hoffman     Reason for Consultation:     Hepatitis C and abnormal liver enzymes        HPI:   Gabriel Hoffman is a 64 y.o. y/o male referred for consultation & management of hepatitis C and abnormal liver enzymes by Dr. Carlean Jews, NP.  This patient comes in for history of hepatitis C antibody being positive with a positive viral load.  The patient has had elevated liver enzymes all the way back to 2013 in his chart.  The patient started having lower leg cramps and for that reason his PCP thought the patient needed to be seen sooner.  The patient's ultrasound shows hepatomegaly with fatty infiltrate.  Back in 2013 the patient's alcohol level had been elevated on 2 separate occasions.  The patient's platelets are normal and his albumin is only slightly low.  The patient's liver enzymes have shown:  Component     Latest Ref Rng & Units 01/04/2020 05/13/2020  Total Bilirubin     0.0 - 1.2 mg/dL 1.1 1.4 (H)  Alkaline Phosphatase     48 - 121 IU/L 140 (H) 127 (H)  AST     0 - 40 IU/L 64 (H) 116 (H)  ALT     0 - 44 IU/L 62 (H) 130 (H)   The patient reports that he has cut down his drinking and usually only drinks on the weekends and states that he can sit down for a 12 pack a time.  There is no report of any abdominal pain nausea vomiting fevers or chills.  The patient states that he was treated with interferon many years ago.  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  The patient has also spent a considerable amount of time in jail.  Past Medical History:  Diagnosis Date  . Hypertension   . Thyroid disease     Past Surgical History:  Procedure Laterality Date  . CARDIAC CATHETERIZATION    . CATARACT EXTRACTION    . LEG SURGERY Right    rod in leg  . WRIST SURGERY Left     Prior to  Admission medications   Medication Sig Start Date End Date Taking? Authorizing Provider  atorvastatin (LIPITOR) 10 MG tablet Take 1 tablet (10 mg total) by mouth daily. 05/12/20   Gabriel Jews, NP  hydrochlorothiazide (HYDRODIURIL) 25 MG tablet Take 1 tablet (25 mg total) by mouth daily. 02/08/20   Gabriel Odea, MD  HYDROcodone-acetaminophen (NORCO/VICODIN) 5-325 MG tablet Take 1 tablet by mouth every 6 (six) hours as needed for moderate pain. 03/21/20   Gabriel Jews, NP  levothyroxine (SYNTHROID) 112 MCG tablet Take 1 tablet (112 mcg total) by mouth daily before breakfast. 04/28/20   Gabriel Jews, NP  metoprolol succinate (TOPROL-XL) 100 MG 24 hr tablet Take 1 tablet (100 mg total) by mouth daily. Take with or immediately following a meal. 06/12/20   Boscia, Kathlynn Grate, NP  nitroGLYCERIN (NITRODUR - DOSED IN MG/24 HR) 0.4 mg/hr patch Place 0.4 mg onto the skin daily.    [provider]  pantoprazole (PROTONIX) 20 MG tablet Take 20 mg by mouth daily.    [provider]  rivaroxaban (XARELTO) 20 MG TABS tablet Take 1 tablet (20 mg total) by mouth daily  with supper. 06/04/20   Gabriel Odea, MD    Family History  Problem Relation Age of Onset  . Cancer Mother   . COPD Mother   . Cancer Sister      Social History   Tobacco Use  . Smoking status: Current Every Day Smoker    Packs/day: 1.00    Types: Cigarettes  . Smokeless tobacco: Never Used  Vaping Use  . Vaping Use: Never used  Substance Use Topics  . Alcohol use: Never  . Drug use: Never    Allergies as of 06/26/2020  . (No Known Allergies)    Review of Systems:    All systems reviewed and negative except where noted in HPI.   Physical Exam:  There were no vitals taken for this visit. No LMP for male patient. General:   Alert,  Well-developed, well-nourished, pleasant and cooperative in NAD Head:  Normocephalic and atraumatic. Eyes:  Sclera clear, no icterus.   Conjunctiva pink. Ears:   Normal auditory acuity. Neck:  Supple; no masses or thyromegaly. Lungs:  Respirations even and unlabored.  Clear throughout to auscultation.   No wheezes, crackles, or rhonchi. No acute distress. Heart:  Regular rate and rhythm; no murmurs, clicks, rubs, or gallops. Abdomen:  Normal bowel sounds.  No bruits.  Soft, non-tender and non-distended without masses, hepatosplenomegaly or hernias noted.  No guarding or rebound tenderness.  Negative Carnett sign.   Rectal:  Deferred.  Pulses:  Normal pulses noted. Extremities:  No clubbing or edema.  No cyanosis. Neurologic:  Alert and oriented x3;  grossly normal neurologically. Skin:  Intact without significant lesions or rashes.  No jaundice. Lymph Nodes:  No significant cervical adenopathy. Psych:  Alert and cooperative. Normal mood and affect.  Imaging Studies: No results found.  Assessment and Plan:   Gabriel Hoffman is a 64 y.o. y/o male who comes in with acute on chronic hepatitis.  The patient's chronic hepatitis C is likely present for many years and his liver enzymes were abnormal back in 2013.  With a normal platelet count and an enlarged liver showing fatty liver it is unlikely he has advanced cirrhosis.  The patient's liver enzymes had increased from March of this year to July of this year. The patient's joint pain and leg pain is unlikely caused by his liver disease and with a normal platelet count and only slightly low albumin it does not appear that he has advanced cirrhosis.  Therefore any systemic symptoms are unlikely except for fatigue which is associated with hepatitis C.  I would recommend looking for other causes for his extremity pain and joint pain.  The patient will have labs sent off for possible other causes of his abnormal liver enzymes although his alcohol may be accounting for her.  When all of his labs are back we will then start treatment for his hepatitis C.  The patient has been explained the plan and agrees with  it.    Gabriel Minium, MD. Clementeen Graham    Note: This dictation was prepared with Dragon dictation along with smaller phrase technology. Any transcriptional errors that result from this process are unintentional.

## 2020-06-30 ENCOUNTER — Other Ambulatory Visit: Payer: Self-pay

## 2020-06-30 ENCOUNTER — Telehealth: Payer: Self-pay

## 2020-06-30 DIAGNOSIS — R790 Abnormal level of blood mineral: Secondary | ICD-10-CM

## 2020-06-30 LAB — HCV FIBROSURE
ALPHA 2-MACROGLOBULINS, QN: 403 mg/dL — ABNORMAL HIGH (ref 110–276)
ALT (SGPT) P5P: 139 IU/L — ABNORMAL HIGH (ref 0–55)
Apolipoprotein A-1: 103 mg/dL (ref 101–178)
Bilirubin, Total: 1.5 mg/dL — ABNORMAL HIGH (ref 0.0–1.2)
Fibrosis Score: 0.99 — ABNORMAL HIGH (ref 0.00–0.21)
GGT: 657 IU/L (ref 0–65)
Haptoglobin: <10 mg/dL — ABNORMAL LOW (ref 32–363)
Necroinflammat Activity Score: 0.87 — ABNORMAL HIGH (ref 0.00–0.17)

## 2020-06-30 LAB — ALPHA-1-ANTITRYPSIN: A-1 Antitrypsin: 211 mg/dL — ABNORMAL HIGH (ref 101–187)

## 2020-06-30 LAB — HEPATIC FUNCTION PANEL
ALT: 116 IU/L — ABNORMAL HIGH (ref 0–44)
AST: 124 IU/L — ABNORMAL HIGH (ref 0–40)
Albumin: 3.3 g/dL — ABNORMAL LOW (ref 3.8–4.8)
Alkaline Phosphatase: 150 IU/L — ABNORMAL HIGH (ref 48–121)
Bilirubin Total: 1.6 mg/dL — ABNORMAL HIGH (ref 0.0–1.2)
Bilirubin, Direct: 0.84 mg/dL — ABNORMAL HIGH (ref 0.00–0.40)
Total Protein: 7.3 g/dL (ref 6.0–8.5)

## 2020-06-30 LAB — IRON AND TIBC
Iron Saturation: 88 % (ref 15–55)
Iron: 314 ug/dL (ref 38–169)
Total Iron Binding Capacity: 356 ug/dL (ref 250–450)
UIBC: 42 ug/dL — ABNORMAL LOW (ref 111–343)

## 2020-06-30 LAB — MITOCHONDRIAL ANTIBODIES: Mitochondrial Ab: 39.8 Units — ABNORMAL HIGH (ref 0.0–20.0)

## 2020-06-30 LAB — HEPATITIS A ANTIBODY, TOTAL: hep A Total Ab: POSITIVE — AB

## 2020-06-30 LAB — ANA: Anti Nuclear Antibody (ANA): NEGATIVE

## 2020-06-30 LAB — HEPATITIS B SURFACE ANTIGEN: Hepatitis B Surface Ag: NEGATIVE

## 2020-06-30 LAB — HEPATITIS B SURFACE ANTIBODY,QUALITATIVE: Hep B Surface Ab, Qual: NONREACTIVE

## 2020-06-30 LAB — HEPATITIS C GENOTYPE

## 2020-06-30 LAB — CERULOPLASMIN: Ceruloplasmin: 27.6 mg/dL (ref 16.0–31.0)

## 2020-06-30 LAB — ANTI-SMOOTH MUSCLE ANTIBODY, IGG: Smooth Muscle Ab: 17 Units (ref 0–19)

## 2020-06-30 NOTE — Telephone Encounter (Signed)
Pt notified of results. Pt will come Wednesday for additional lab.

## 2020-06-30 NOTE — Telephone Encounter (Signed)
-----   Message from Midge Minium, MD sent at 06/27/2020  7:43 AM EDT ----- His iron is very high. Cna we get a hemochromatosis gene added

## 2020-07-04 ENCOUNTER — Other Ambulatory Visit: Payer: Self-pay

## 2020-07-04 MED ORDER — PANTOPRAZOLE SODIUM 20 MG PO TBEC
20.0000 mg | DELAYED_RELEASE_TABLET | Freq: Every day | ORAL | 1 refills | Status: DC
Start: 2020-07-04 — End: 2020-12-01

## 2020-07-08 LAB — HEMOCHROMATOSIS DNA-PCR(C282Y,H63D)

## 2020-07-14 ENCOUNTER — Telehealth: Payer: Self-pay | Admitting: Gastroenterology

## 2020-07-14 NOTE — Telephone Encounter (Signed)
Patient states Dr. Servando Snare wants him to get Hep B shot, and pt wants to know how to set that up and get that. Pt also states ins denied Hep C medication and wants to know what Dr. Servando Snare would suggest doing next. Please call pt back and advise.

## 2020-07-15 ENCOUNTER — Other Ambulatory Visit: Payer: Self-pay

## 2020-07-15 MED ORDER — SOFOSBUVIR-VELPATASVIR 400-100 MG PO TABS: 1.0000 | ORAL_TABLET | Freq: Every day | ORAL | 0 refills | Status: AC

## 2020-07-15 NOTE — Telephone Encounter (Signed)
Pt advised his medication denial was overturned by his insurance and has been approved. Delivery is scheduled for Thursday, Sept 16th. Also advised pt that we do not offer the Hep B vaccine in our office only the Hep A/B combo. Advised pt to contact his PCP and request a nurse visit or request it at his follow up appt.

## 2020-07-17 ENCOUNTER — Telehealth: Payer: Self-pay

## 2020-07-17 ENCOUNTER — Ambulatory Visit: Payer: Medicaid Other | Admitting: Gastroenterology

## 2020-07-17 NOTE — Telephone Encounter (Signed)
Pt notified of results. Pt referred to Hematology due to Hep C diagnosis.

## 2020-07-17 NOTE — Telephone Encounter (Signed)
-----   Message from Darren Wohl, MD sent at 07/16/2020  6:50 AM EDT ----- Let the patient know that his genetic testing showed him to have the hemochromatosis mutation.  He should be sent to hematology for phlebotomies due to his iron overload. 

## 2020-07-17 NOTE — Telephone Encounter (Signed)
-----   Message from Midge Minium, MD sent at 07/16/2020  6:50 AM EDT ----- Let the patient know that his genetic testing showed him to have the hemochromatosis mutation.  He should be sent to hematology for phlebotomies due to his iron overload.

## 2020-07-17 NOTE — Telephone Encounter (Signed)
Pt notified of results. Referral sent to hematology.

## 2020-07-18 ENCOUNTER — Telehealth: Payer: Self-pay | Admitting: Gastroenterology

## 2020-07-18 NOTE — Telephone Encounter (Signed)
Contacted pt regarding the Hep B shot prior to starting his Epclusa. Pt advised he can still start the medication and receive the vaccine during treatment.

## 2020-07-18 NOTE — Telephone Encounter (Signed)
Patient states he received his medication but per his paper work it says not to start it till he has his Hep B shot. Pt wanting advice. Please return his call.

## 2020-07-22 ENCOUNTER — Other Ambulatory Visit: Payer: Self-pay

## 2020-07-22 DIAGNOSIS — E039 Hypothyroidism, unspecified: Secondary | ICD-10-CM

## 2020-07-22 MED ORDER — LEVOTHYROXINE SODIUM 112 MCG PO TABS: 112.0000 ug | ORAL_TABLET | Freq: Every day | ORAL | 3 refills | Status: DC

## 2020-07-25 ENCOUNTER — Encounter (INDEPENDENT_AMBULATORY_CARE_PROVIDER_SITE_OTHER): Payer: Self-pay

## 2020-07-25 ENCOUNTER — Inpatient Hospital Stay: Payer: Medicaid Other

## 2020-07-25 ENCOUNTER — Inpatient Hospital Stay: Payer: Medicaid Other | Attending: Internal Medicine | Admitting: Internal Medicine

## 2020-07-25 ENCOUNTER — Other Ambulatory Visit: Payer: Self-pay

## 2020-07-25 ENCOUNTER — Encounter: Payer: Self-pay | Admitting: Internal Medicine

## 2020-07-25 DIAGNOSIS — K7460 Unspecified cirrhosis of liver: Secondary | ICD-10-CM | POA: Diagnosis not present

## 2020-07-25 DIAGNOSIS — B1920 Unspecified viral hepatitis C without hepatic coma: Secondary | ICD-10-CM | POA: Diagnosis not present

## 2020-07-25 DIAGNOSIS — R7989 Other specified abnormal findings of blood chemistry: Secondary | ICD-10-CM | POA: Insufficient documentation

## 2020-07-25 LAB — COMPREHENSIVE METABOLIC PANEL
ALT: 44 U/L (ref 0–44)
AST: 41 U/L (ref 15–41)
Albumin: 3.2 g/dL — ABNORMAL LOW (ref 3.5–5.0)
Alkaline Phosphatase: 97 U/L (ref 38–126)
Anion gap: 10 (ref 5–15)
BUN: 17 mg/dL (ref 8–23)
CO2: 29 mmol/L (ref 22–32)
Calcium: 9.5 mg/dL (ref 8.9–10.3)
Chloride: 99 mmol/L (ref 98–111)
Creatinine, Ser: 0.91 mg/dL (ref 0.61–1.24)
GFR calc Af Amer: >60 mL/min (ref >60)
GFR calc non Af Amer: >60 mL/min (ref >60)
Glucose, Bld: 115 mg/dL — ABNORMAL HIGH (ref 70–99)
Potassium: 3.8 mmol/L (ref 3.5–5.1)
Sodium: 138 mmol/L (ref 135–145)
Total Bilirubin: 1.9 mg/dL — ABNORMAL HIGH (ref 0.3–1.2)
Total Protein: 7.8 g/dL (ref 6.5–8.1)

## 2020-07-25 LAB — CBC WITH DIFFERENTIAL/PLATELET
Abs Immature Granulocytes: 0.05 10*3/uL (ref 0.00–0.07)
Basophils Absolute: 0.2 10*3/uL — ABNORMAL HIGH (ref 0.0–0.1)
Basophils Relative: 1 %
Eosinophils Absolute: 0.4 10*3/uL (ref 0.0–0.5)
Eosinophils Relative: 3 %
HCT: 49.3 % (ref 39.0–52.0)
Hemoglobin: 17.9 g/dL — ABNORMAL HIGH (ref 13.0–17.0)
Immature Granulocytes: 0 %
Lymphocytes Relative: 25 %
Lymphs Abs: 2.8 10*3/uL (ref 0.7–4.0)
MCH: 34.5 pg — ABNORMAL HIGH (ref 26.0–34.0)
MCHC: 36.3 g/dL — ABNORMAL HIGH (ref 30.0–36.0)
MCV: 95 fL (ref 80.0–100.0)
Monocytes Absolute: 1.5 10*3/uL — ABNORMAL HIGH (ref 0.1–1.0)
Monocytes Relative: 13 %
Neutro Abs: 6.4 10*3/uL (ref 1.7–7.7)
Neutrophils Relative %: 58 %
Platelets: 125 10*3/uL — ABNORMAL LOW (ref 150–400)
RBC: 5.19 MIL/uL (ref 4.22–5.81)
RDW: 12.5 % (ref 11.5–15.5)
WBC: 11.3 10*3/uL — ABNORMAL HIGH (ref 4.0–10.5)
nRBC: 0 % (ref 0.0–0.2)

## 2020-07-25 LAB — IRON AND TIBC
Iron: 276 ug/dL — ABNORMAL HIGH (ref 45–182)
Saturation Ratios: 67 % — ABNORMAL HIGH (ref 17.9–39.5)
TIBC: 410 ug/dL (ref 250–450)
UIBC: 134 ug/dL

## 2020-07-25 LAB — FERRITIN: Ferritin: 124 ng/mL (ref 24–336)

## 2020-07-25 NOTE — Progress Notes (Signed)
[facility] CONSULT NOTE  Patient Care Team: Carlean Jews, NP as PCP - General (Family Medicine)  CHIEF COMPLAINTS/PURPOSE OF CONSULTATION: Hemochromatosis.   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 Two copies of H63D were identified.  C282Y and S65C were negative  # Cirrhosis/ HEP C [started end of sep,2021 x 12 weeks; Dr.Wohl]  # CAD- on xarelto.   HISTORY OF PRESENTING ILLNESS:  Gabriel Hoffman 64 y.o.  male has been referred to [place] for further evaluation/work-up/ recommendations for elevated iron studies.  Patient was recently evaluated by GI given his elevated LFTs/diagnosed with hepatitis C/cirrhosis.  Patient currently on antiretroviral for hepatitis C infection.  Given patient's ongoing complaints of joint pains extreme fatigue-patient had further work-up including iron studies; genotyping suggestive of hemochromatosis.   Patient complains of joint pains.  Complains of fatigue.  Complains of easy bruising [on blood thinners].  Denies any skin rash.   Review of Systems  Constitutional: Positive for malaise/fatigue. Negative for chills, diaphoresis, fever and weight loss.  HENT: Negative for nosebleeds and sore throat.   Eyes: Negative for double vision.  Respiratory: Negative for cough, hemoptysis, sputum production, shortness of breath and wheezing.   Cardiovascular: Negative for chest pain, palpitations, orthopnea and leg swelling.  Gastrointestinal: Negative for abdominal pain, blood in stool, constipation, diarrhea, heartburn, melena, nausea and vomiting.  Genitourinary: Negative for dysuria, frequency and urgency.  Musculoskeletal: Positive for back pain, joint pain and myalgias.  Skin: Negative.  Negative for itching and rash.  Neurological: Negative for  dizziness, tingling, focal weakness, weakness and headaches.  Endo/Heme/Allergies: Does not bruise/bleed easily.  Psychiatric/Behavioral: Negative for depression. The patient is not nervous/anxious and does not have insomnia.     MEDICAL HISTORY:  Past Medical History:  Diagnosis Date  . Hypertension   . Thyroid disease     SURGICAL HISTORY: Past Surgical History:  Procedure Laterality Date  . CARDIAC CATHETERIZATION    . CATARACT EXTRACTION    . LEG SURGERY Right    rod in leg  . WRIST SURGERY Left     SOCIAL HISTORY: Social History   Socioeconomic History  . Marital status: Single    Spouse name: Not on file  . Number of children: Not on file  . Years of education: Not on file  . Highest education level: Not on file  Occupational History  . Not on file  Tobacco Use  . Smoking status: Current Every Day Smoker    Packs/day: 1.00    Types: Cigarettes  . Smokeless tobacco: Never Used  Vaping Use  . Vaping Use: Never used  Substance and Sexual Activity  . Alcohol use: Never  . Drug use: Never  . Sexual activity: Not on file  Other Topics Concern  . Not on file  Social History Narrative   Alcohol/beer once/twice a month; 1/2 ppd; used to work in [employer]. In [place]; with sister.    Social Determinants of Health   Financial Resource Strain:   . Difficulty of Paying Living Expenses: Not on file  Food Insecurity:   . Worried About Programme researcher, broadcasting/film/video in the Last Year: Not on file  . Ran Out of Food in the Last Year: Not on file  Transportation Needs:   . Lack of Transportation (Medical): Not on file  . Lack of Transportation (  Non-Medical): Not on file  Physical Activity:   . Days of Exercise per Week: Not on file  . Minutes of Exercise per Session: Not on file  Stress:   . Feeling of Stress : Not on file  Social Connections:   . Frequency of Communication with Friends and Family: Not on file  . Frequency of Social Gatherings with Friends and Family:  Not on file  . Attends Religious Services: Not on file  . Active Member of Clubs or Organizations: Not on file  . Attends Banker Meetings: Not on file  . Marital Status: Not on file  Intimate Partner Violence:   . Fear of Current or Ex-Partner: Not on file  . Emotionally Abused: Not on file  . Physically Abused: Not on file  . Sexually Abused: Not on file    FAMILY HISTORY: Family History  Problem Relation Age of Onset  . Cancer Mother   . COPD Mother   . Cancer Sister     ALLERGIES:  has No Known Allergies.  MEDICATIONS:  Current Outpatient Medications  Medication Sig Dispense Refill  . atorvastatin (LIPITOR) 10 MG tablet Take 1 tablet (10 mg total) by mouth daily. 30 tablet 5  . hydrochlorothiazide (HYDRODIURIL) 25 MG tablet Take 1 tablet (25 mg total) by mouth daily. 90 tablet 2  . HYDROcodone-acetaminophen (NORCO/VICODIN) 5-325 MG tablet Take 1 tablet by mouth every 6 (six) hours as needed for moderate pain. 30 tablet 0  . levothyroxine (SYNTHROID) 112 MCG tablet Take 1 tablet (112 mcg total) by mouth daily before breakfast. 30 tablet 3  . metoprolol succinate (TOPROL-XL) 100 MG 24 hr tablet Take 1 tablet (100 mg total) by mouth daily. Take with or immediately following a meal. 30 tablet 3  . nitroGLYCERIN (NITRODUR - DOSED IN MG/24 HR) 0.4 mg/hr patch Place 0.4 mg onto the skin daily.    . pantoprazole (PROTONIX) 20 MG tablet Take 1 tablet (20 mg total) by mouth daily. 90 tablet 1  . rivaroxaban (XARELTO) 20 MG TABS tablet Take 1 tablet (20 mg total) by mouth daily with supper. 90 tablet 1  . Sofosbuvir-Velpatasvir (EPCLUSA) 400-100 MG TABS Take 1 tablet by mouth daily. 91 tablet 0   No current facility-administered medications for this visit.      PHYSICAL EXAMINATION:   Vitals:   07/25/20 1113  BP: 134/80  Pulse: 61  Resp: 16  Temp: 97.6 F (36.4 C)  SpO2: 96%   Filed Weights   07/25/20 1113  Weight: 176 lb (79.8 kg)    Physical  Exam HENT:     Head: Normocephalic and atraumatic.     Mouth/Throat:     Pharynx: No oropharyngeal exudate.  Eyes:     Pupils: Pupils are equal, round, and reactive to light.  Cardiovascular:     Rate and Rhythm: Normal rate and regular rhythm.  Pulmonary:     Effort: No respiratory distress.     Breath sounds: No wheezing.     Comments: Decreased air entry bilaterally.  Abdominal:     General: Bowel sounds are normal. There is no distension.     Palpations: Abdomen is soft. There is no mass.     Tenderness: There is no abdominal tenderness. There is no guarding or rebound.  Musculoskeletal:        General: No tenderness. Normal range of motion.     Cervical back: Normal range of motion and neck supple.  Skin:    General: Skin is warm.  Neurological:     Mental Status: He is alert and oriented to person, place, and time.  Psychiatric:        Mood and Affect: Affect normal.     LABORATORY DATA:  I have reviewed the data as listed Lab Results  Component Value Date   WBC 11.3 (H) 07/25/2020   HGB 17.9 (H) 07/25/2020   HCT 49.3 07/25/2020   MCV 95.0 07/25/2020   PLT 125 (L) 07/25/2020   Recent Labs    01/04/20 1310 01/04/20 1310 05/13/20 0833 06/26/20 1609 07/25/20 1216  NA 137  --  138  --  138  K 3.9  --  4.1  --  3.8  CL 102  --  100  --  99  CO2 23  --  27  --  29  GLUCOSE 102*  --  94  --  115*  BUN 13  --  16  --  17  CREATININE 0.88  --  0.95  --  0.91  CALCIUM 8.9  --  9.1  --  9.5  GFRNONAA 93  --  85  --  >60  GFRAA 107  --  99  --  >60  PROT 7.1   < > 7.1 7.3 7.8  ALBUMIN 3.5*   < > 3.6* 3.3* 3.2*  AST 64*   < > 116* 124* 41  ALT 62*   < > 130* 116* 44  ALKPHOS 140*   < > 127* 150* 97  BILITOT 1.1   < > 1.4* 1.6* 1.9*  BILIDIR  --   --   --  0.84*  --    < > = values in this interval not displayed.     No results found.  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   I had a long discussion the patient regarding the patho-biology of hemochromatosis- the fact that elevated iron levels over many years could lead to deposition in the organs leading to-cirrhosis, arthralgias, skin pigmentation, diabetes; central endocrine abnormalities; heart failure etc. this patient is high risk of organ involvement given his underlying hepatitis C/cirrhosis.  Also recommend alcohol abstinence.  #Recommend weekly x3; and then reevaluation in 2 months-to keep the goal ferritin 100-150  # Hepatitis C/cirrhosis- child A- as per GI. On antiviral.  # Active smoker- Discussed with the patient regarding the ill effects of smoking- including but not limited to cardiac lung and vascular diseases and malignancies. Counseled against smoking.  Benefit from lung cancer screening program.  Thank you Dr.Wohl for allowing me to participate in the care of your pleasant patient. Please do not hesitate to contact me with questions or concerns in the interim.  # DISPOSITION:  # Labs today- please order: cbc/cmp/AFP/iron studies/ferritin # Phlebotomy- weekly x 3; start next next week.  # Follow upin 2 months-MD; labs- cbc/cmp;Ferritin; Possible phlebotomy- Dr.B   All questions were answered. The patient knows to call the clinic with any problems, questions or concerns.   Earna Coder, MD 07/25/2020 1:07 PM

## 2020-07-25 NOTE — Assessment & Plan Note (Addendum)
#   Hereditary hemochromatosis-elevated iron saturation-iron studies; unclear if patient has organ involvement-given the hepatitis C/cirrhosis.   I had a long discussion the patient regarding the patho-biology of hemochromatosis- the fact that elevated iron levels over many years could lead to deposition in the organs leading to-cirrhosis, arthralgias, skin pigmentation, diabetes; central endocrine abnormalities; heart failure etc. this patient is high risk of organ involvement given his underlying hepatitis C/cirrhosis.  Also recommend alcohol abstinence.  #Recommend weekly x3; and then reevaluation in 2 months-to keep the goal ferritin 100-150  # Hepatitis C/cirrhosis- child A- as per GI. On antiviral.  # Active smoker- Discussed with the patient regarding the ill effects of smoking- including but not limited to cardiac lung and vascular diseases and malignancies. Counseled against smoking.  Benefit from lung cancer screening program.  Thank you Dr.Wohl for allowing me to participate in the care of your pleasant patient. Please do not hesitate to contact me with questions or concerns in the interim.  # DISPOSITION:  # Labs today- please order: cbc/cmp/AFP/iron studies/ferritin # Phlebotomy- weekly x 3; start next next week.  # Follow upin 2 months-MD; labs- cbc/cmp;Ferritin; Possible phlebotomy- Dr.B

## 2020-07-26 LAB — AFP TUMOR MARKER: AFP, Serum, Tumor Marker: 74.3 ng/mL — ABNORMAL HIGH (ref 0.0–8.3)

## 2020-07-28 ENCOUNTER — Inpatient Hospital Stay: Payer: Medicaid Other

## 2020-07-28 ENCOUNTER — Other Ambulatory Visit: Payer: Self-pay | Admitting: Internal Medicine

## 2020-07-28 ENCOUNTER — Other Ambulatory Visit: Payer: Self-pay

## 2020-07-28 NOTE — Progress Notes (Signed)
Pt tolerated phlebotomy procedure well with no complications. VSS. Pt stable for discharge.   Moon [employer]

## 2020-08-04 ENCOUNTER — Other Ambulatory Visit: Payer: Self-pay

## 2020-08-04 ENCOUNTER — Inpatient Hospital Stay: Payer: Medicaid Other | Attending: Internal Medicine

## 2020-08-04 NOTE — Progress Notes (Signed)
350cc of blood removed via therapeutic phlebotomy. Pt tolerated well. VSS at discharge.

## 2020-08-08 ENCOUNTER — Other Ambulatory Visit: Payer: Self-pay

## 2020-08-08 ENCOUNTER — Ambulatory Visit (INDEPENDENT_AMBULATORY_CARE_PROVIDER_SITE_OTHER): Payer: Medicaid Other | Admitting: Cardiology

## 2020-08-08 ENCOUNTER — Encounter: Payer: Self-pay | Admitting: Cardiology

## 2020-08-08 VITALS — BP 126/80 | HR 64 | Ht 66.0 in | Wt 175.0 lb

## 2020-08-08 DIAGNOSIS — I480 Paroxysmal atrial fibrillation: Secondary | ICD-10-CM

## 2020-08-08 DIAGNOSIS — I2510 Atherosclerotic heart disease of native coronary artery without angina pectoris: Secondary | ICD-10-CM | POA: Diagnosis not present

## 2020-08-08 DIAGNOSIS — I10 Essential (primary) hypertension: Secondary | ICD-10-CM

## 2020-08-08 NOTE — Progress Notes (Signed)
Cardiology Office Note:    Date:  08/08/2020   ID:  Gabriel Hoffman, DOB Nov 28, 1955, MRN [id number]  PCP:  Carlean Jews, NP  Cardiologist:  No primary care provider on file.  Electrophysiologist:  None   Referring MD: Carlean Jews, NP   Chief Complaint  Patient presents with  . Follow-up    Patient has no complaints today at this visit. Medications verbally reviewed with patient.     History of Present Illness:    Gabriel Hoffman is a 64 y.o. 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    Patient was last seen due to history of CAD, A. fib hypertension.  Echocardiogram was ordered due to history of CAD.  He denies any chest pain, palpitations, shortness of breath.  Able to perform all activities of daily living without any symptoms.  Tolerating medications including Xarelto without any bleeding side effects.  Has no concerns at this time.  Historical notes Patient states having a heart attack in February 2020 while he was incarcerated.  He was taken to [facility] in [facility].  He states being there for 1 week, no procedures were performed and he was placed on meds.  In September of he states being taken to [facility] where he was diagnosed with atrial fibrillation.  He had an ablation and was started on Xarelto.  He recently followed up with a primary care provider in the area to establish care.  His blood pressures were elevated and he was started on hydrochlorothiazide 12.5 mg daily.  He is a current smoker and is working on quitting.    Past Medical History:  Diagnosis Date  . Hypertension   . Thyroid disease     Past Surgical History:  Procedure Laterality Date  . CARDIAC CATHETERIZATION    . CATARACT EXTRACTION    . LEG SURGERY Right    rod in leg  . WRIST SURGERY Left     Current  Medications: Current Meds  Medication Sig  . atorvastatin (LIPITOR) 10 MG tablet Take 1 tablet (10 mg total) by mouth daily.  . hydrochlorothiazide (HYDRODIURIL) 25 MG tablet Take 1 tablet (25 mg total) by mouth daily.  Marland Kitchen levothyroxine (SYNTHROID) 112 MCG tablet Take 1 tablet (112 mcg total) by mouth daily before breakfast.  . metoprolol succinate (TOPROL-XL) 100 MG 24 hr tablet Take 1 tablet (100 mg total) by mouth daily. Take with or immediately following a meal.  . nitroGLYCERIN (NITRODUR - DOSED IN MG/24 HR) 0.4 mg/hr patch Place 0.4 mg onto the skin daily.  . pantoprazole (PROTONIX) 20 MG tablet Take 1 tablet (20 mg total) by mouth daily.  . rivaroxaban (XARELTO) 20 MG TABS tablet Take 1 tablet (20 mg total) by mouth daily with supper.  . Sofosbuvir-Velpatasvir (EPCLUSA) 400-100 MG TABS Take 1 tablet by mouth daily.     Allergies:   Patient has no known allergies.   Social History   Socioeconomic History  . Marital status: Single    Spouse name: Not on file  . Number of children: Not on file  . Years of education: Not on file  . Highest education level: Not on file  Occupational History  . Not on file  Tobacco Use  . Smoking status: Current Every Day Smoker    Packs/day: 1.00    Types: Cigarettes  . Smokeless tobacco:  Never Used  Vaping Use  . Vaping Use: Never used  Substance and Sexual Activity  . Alcohol use: Never  . Drug use: Never  . Sexual activity: Not on file  Other Topics Concern  . Not on file  Social History Narrative   Alcohol/beer once/twice a month; 1/2 ppd; used to work in [employer]. In [place]; with sister.    Social Determinants of Health   Financial Resource Strain:   . Difficulty of Paying Living Expenses: Not on file  Food Insecurity:   . Worried About Programme researcher, broadcasting/film/video in the Last Year: Not on file  . Ran Out of Food in the Last Year: Not on file  Transportation Needs:   . Lack of Transportation (Medical): Not on file  . Lack of  Transportation (Non-Medical): Not on file  Physical Activity:   . Days of Exercise per Week: Not on file  . Minutes of Exercise per Session: Not on file  Stress:   . Feeling of Stress : Not on file  Social Connections:   . Frequency of Communication with Friends and Family: Not on file  . Frequency of Social Gatherings with Friends and Family: Not on file  . Attends Religious Services: Not on file  . Active Member of Clubs or Organizations: Not on file  . Attends Banker Meetings: Not on file  . Marital Status: Not on file     Family History: The patient's family history includes COPD in his mother; Cancer in his mother and sister.  ROS:   Please see the history of present illness.     All other systems reviewed and are negative.  EKGs/Labs/Other Studies Reviewed:    The following studies were reviewed today:   EKG:  EKG is  ordered today.  The ekg ordered today demonstrates normal sinus rhythm, normal ECG.  Recent Labs: 01/04/2020: TSH 0.731 05/13/2020: Magnesium 2.0 07/25/2020: ALT 44; BUN 17; Creatinine, Ser 0.91; Hemoglobin 17.9; Platelets 125; Potassium 3.8; Sodium 138  Recent Lipid Panel    Component Value Date/Time   CHOL 102 01/04/2020 1310   TRIG 268 (H) 01/04/2020 1310   HDL 23 (L) 01/04/2020 1310   LDLCALC 38 01/04/2020 1310    Physical Exam:    VS:  BP 126/80 (BP Location: Left Arm, Patient Position: Sitting, Cuff Size: Normal)   Pulse 64   Ht 5\' 6"  (1.676 m)   Wt 175 lb (79.4 kg)   SpO2 96%   BMI 28.25 kg/m     Wt Readings from Last 3 Encounters:  08/08/20 175 lb (79.4 kg)  07/25/20 176 lb (79.8 kg)  06/26/20 173 lb 12.8 oz (78.8 kg)     GEN:  Well nourished, well developed in no acute distress HEENT: Normal NECK: No JVD; No carotid bruits LYMPHATICS: No lymphadenopathy CARDIAC: RRR, no murmurs, rubs, gallops RESPIRATORY:  Clear to auscultation without rales ABDOMEN: Soft, non-tender, non-distended MUSCULOSKELETAL:  No edema; No  deformity  SKIN: Warm and dry` NEUROLOGIC:  Alert and oriented x 3 PSYCHIATRIC:  Normal affect   ASSESSMENT:    1. Coronary artery disease involving native coronary artery of native heart without angina pectoris   2. PAF (paroxysmal atrial fibrillation) (HCC)   3. Essential hypertension    PLAN:    In order of problems listed above:  1. Patient states having a history of MI in February 2020.  Currently denies chest pain or shortness of breath.  Not able to confirm with medical records from outside  facility.  Patient currently asymptomatic.   Continue statin, on Xarelto for A. fib as below.  Last  Echocardiogram showed normal systolic function with EF 55 to 60%, impaired relaxation, mild MR. 2. History of atrial fibrillation status post ablation in September 2020 at [facility]. 3. Sinus rhythm, continue Toprol-XL and Xarelto. 4. Patient with history of hypertension.  Be controlled.  Continue HCTZ, Toprol-XL.  Follow-up in 6 months.  Total encounter time  Greater than 50% was spent in counseling and coordination of care with the patient   This note was generated in part or whole with voice recognition software. Voice recognition is usually quite accurate but there are transcription errors that can and very often do occur. I apologize for any typographical errors that were not detected and corrected.  Medication Adjustments/Labs and Tests Ordered: Current medicines are reviewed at length with the patient today.  Concerns regarding medicines are outlined above.  Orders Placed This Encounter  Procedures  . EKG 12-Lead   No orders of the defined types were placed in this encounter.   Patient Instructions  Medication Instructions:  Your physician recommends that you continue on your current medications as directed. Please refer to the Current Medication list given to you today.  *If you need a refill on your cardiac medications before your next appointment, please  call your pharmacy*   Lab Work: None Ordered If you have labs (blood work) drawn today and your tests are completely normal, you will receive your results only by: Marland Kitchen MyChart Message (if you have MyChart) OR . A paper copy in the mail If you have any lab test that is abnormal or we need to change your treatment, we will call you to review the results.   Testing/Procedures: None Ordered   Follow-Up: At [facility], you and your health needs are our priority.  As part of our continuing mission to provide you with exceptional heart care, we have created designated Provider Care Teams.  These Care Teams include your primary Cardiologist (physician) and Advanced Practice Providers (APPs -  Physician Assistants and Nurse Practitioners) who all work together to provide you with the care you need, when you need it.  We recommend signing up for the patient portal called "MyChart".  Sign up information is provided on this After Visit Summary.  MyChart is used to connect with patients for Virtual Visits (Telemedicine).  Patients are able to view lab/test results, encounter notes, upcoming appointments, etc.  Non-urgent messages can be sent to your provider as well.   To learn more about what you can do with MyChart, go to [email or web address].    Your next appointment:   6 month(s)  The format for your next appointment:   In Person  Provider:   Debbe Odea, MD   Other Instructions      Signed, Debbe Odea, MD  08/08/2020 5:24 PM    [facility]

## 2020-08-08 NOTE — Patient Instructions (Signed)

## 2020-08-11 ENCOUNTER — Other Ambulatory Visit: Payer: Self-pay

## 2020-08-11 ENCOUNTER — Inpatient Hospital Stay: Payer: Medicaid Other

## 2020-08-12 ENCOUNTER — Encounter: Payer: Self-pay | Admitting: Nurse Practitioner

## 2020-08-12 ENCOUNTER — Ambulatory Visit: Payer: Medicaid Other | Admitting: Nurse Practitioner

## 2020-08-12 VITALS — BP 132/84 | HR 77 | Temp 98.4°F | Resp 16 | Ht 66.0 in | Wt 177.8 lb

## 2020-08-12 DIAGNOSIS — K732 Chronic active hepatitis, not elsewhere classified: Secondary | ICD-10-CM

## 2020-08-12 DIAGNOSIS — I2583 Coronary atherosclerosis due to lipid rich plaque: Secondary | ICD-10-CM

## 2020-08-12 DIAGNOSIS — I2510 Atherosclerotic heart disease of native coronary artery without angina pectoris: Secondary | ICD-10-CM

## 2020-08-12 DIAGNOSIS — I10 Essential (primary) hypertension: Secondary | ICD-10-CM | POA: Diagnosis not present

## 2020-08-12 DIAGNOSIS — M25561 Pain in right knee: Secondary | ICD-10-CM

## 2020-08-12 MED ORDER — HYDROCODONE-ACETAMINOPHEN 5-325 MG PO TABS: 1.0000 | ORAL_TABLET | Freq: Two times a day (BID) | ORAL | 0 refills | Status: DC | PRN

## 2020-08-12 NOTE — Progress Notes (Signed)
[facility] [address] [place], [place] [address]  Internal MEDICINE  Office Visit Note  Patient Name: Gabriel Hoffman  [id number]  [id number]  Date of Service: 08/31/2020  Chief Complaint  Patient presents with  . Follow-up    3 month  . controlled substance policy    acknowledged  . Quality Metric Gaps    HIV screen, Tdap, flu vacc    The patient is here for follow up visit. He states he is doing well, in general. He is now seeing hepatology for treatment of chronic hepatitis C. Just started on oral medication for treatment. He states that he continues to have severe pain in his right knee. An x-ray done in 03/2020 showed marked severity degenerative changes involving the medial tibiofemoral compartment. He has seen orthopedics for this. Will eventually need to have knee replacement surgery. Had cortisone injection which helped very little. He has not gone back for follow up. His blood pressure is well controlled.       Current Medication: Outpatient Encounter Medications as of 08/12/2020  Medication Sig  . atorvastatin (LIPITOR) 10 MG tablet Take 1 tablet (10 mg total) by mouth daily.  . hydrochlorothiazide (HYDRODIURIL) 25 MG tablet Take 1 tablet (25 mg total) by mouth daily.  Marland Kitchen HYDROcodone-acetaminophen (NORCO/VICODIN) 5-325 MG tablet Take 1 tablet by mouth 2 (two) times daily as needed for moderate pain.  Marland Kitchen levothyroxine (SYNTHROID) 112 MCG tablet Take 1 tablet (112 mcg total) by mouth daily before breakfast.  . metoprolol succinate (TOPROL-XL) 100 MG 24 hr tablet Take 1 tablet (100 mg total) by mouth daily. Take with or immediately following a meal.  . nitroGLYCERIN (NITRODUR - DOSED IN MG/24 HR) 0.4 mg/hr patch Place 0.4 mg onto the skin daily.  . pantoprazole (PROTONIX) 20 MG tablet Take 1 tablet (20 mg total) by mouth daily.  . rivaroxaban (XARELTO) 20 MG TABS tablet Take 1 tablet (20 mg total) by mouth daily with supper.  . Sofosbuvir-Velpatasvir  (EPCLUSA) 400-100 MG TABS Take 1 tablet by mouth daily.   No facility-administered encounter medications on file as of 08/12/2020.    Surgical History: Past Surgical History:  Procedure Laterality Date  . CARDIAC CATHETERIZATION    . CATARACT EXTRACTION    . LEG SURGERY Right    rod in leg  . WRIST SURGERY Left     Medical History: Past Medical History:  Diagnosis Date  . Cirrhosis (HCC) 06/15/2020  . Hepatitis C 06/15/2020  . Hypertension   . Thyroid disease     Family History: Family History  Problem Relation Age of Onset  . Cancer Mother   . COPD Mother   . Cancer Sister     Social History   Socioeconomic History  . Marital status: Single    Spouse name: Not on file  . Number of children: Not on file  . Years of education: Not on file  . Highest education level: Not on file  Occupational History  . Not on file  Tobacco Use  . Smoking status: Current Every Day Smoker    Packs/day: 1.00    Types: Cigarettes  . Smokeless tobacco: Never Used  . Tobacco comment: pack last about 3 days now 08/12/20  Vaping Use  . Vaping Use: Never used  Substance and Sexual Activity  . Alcohol use: Never  . Drug use: Never  . Sexual activity: Not on file  Other Topics Concern  . 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    Social Determinants of Health   Financial Resource Strain:   . Difficulty of Paying Living Expenses: Not on file  Food Insecurity:   . Worried About Programme researcher, broadcasting/film/video in the Last Year: Not on file  . Ran Out of Food in the Last Year: Not on file  Transportation Needs:   . Lack of Transportation (Medical): Not on file  . Lack of Transportation (Non-Medical): Not on file  Physical Activity:   . Days of Exercise per Week: Not on file  . Minutes of Exercise per Session: Not on file  Stress:   . Feeling of Stress : Not on file  Social Connections:     . Frequency of Communication with Friends and Family: Not on file  . Frequency of Social Gatherings with Friends and Family: Not on file  . Attends Religious Services: Not on file  . Active Member of Clubs or Organizations: Not on file  . Attends Banker Meetings: Not on file  . Marital Status: Not on file  Intimate Partner Violence:   . Fear of Current or Ex-Partner: Not on file  . Emotionally Abused: Not on file  . Physically Abused: Not on file  . Sexually Abused: Not on file      Review of Systems  Constitutional: Positive for activity change. Negative for chills, fatigue and unexpected weight change.       Patient having trouble with walking and standing due to severe pain and swelling of right knee.   HENT: Negative for congestion, postnasal drip, rhinorrhea, sneezing and sore throat.   Respiratory: Negative for cough, chest tightness and shortness of breath.   Cardiovascular: Negative for chest pain and palpitations.  Gastrointestinal: Negative for abdominal pain, constipation, diarrhea, nausea and vomiting.  Musculoskeletal: Positive for arthralgias and joint swelling. Negative for back pain and neck pain.       Pain and swelling of right knee.   Skin: Negative for rash.  Allergic/Immunologic: Negative for environmental allergies.  Neurological: Negative for dizziness, tremors, numbness and headaches.  Hematological: Negative for adenopathy. Does not bruise/bleed easily.  Psychiatric/Behavioral: Negative for behavioral problems (Depression), sleep disturbance and suicidal ideas. The patient is not nervous/anxious.     Today's Vitals   08/12/20 1630  BP: 132/84  Pulse: 77  Resp: 16  Temp: 98.4 F (36.9 C)  SpO2: 98%  Weight: 177 lb 12.8 oz (80.6 kg)  Height: 5\' 6"  (1.676 m)   Body mass index is 28.7 kg/m.  Physical Exam Vitals and nursing note reviewed.  Constitutional:      General: He is not in acute distress.    Appearance: Normal appearance.  He is well-developed. He is not diaphoretic.  HENT:     Head: Normocephalic and atraumatic.     Mouth/Throat:     Pharynx: No oropharyngeal exudate.  Eyes:     Pupils: Pupils are equal, round, and reactive to light.  Neck:     Thyroid: No thyromegaly.     Vascular: No JVD.     Trachea: No tracheal deviation.  Cardiovascular:     Rate and Rhythm: Normal rate and regular rhythm.     Heart sounds: Normal heart sounds. No murmur heard.  No friction rub. No gallop.   Pulmonary:     Effort: Pulmonary effort is normal. No respiratory distress.     Breath sounds: Normal breath sounds. No wheezing or  rales.  Chest:     Chest wall: No tenderness.  Abdominal:     Palpations: Abdomen is soft.  Musculoskeletal:        General: Normal range of motion.     Cervical back: Normal range of motion and neck supple.       Legs:  Lymphadenopathy:     Cervical: No cervical adenopathy.  Skin:    General: Skin is warm and dry.  Neurological:     General: No focal deficit present.     Mental Status: He is alert and oriented to person, place, and time.     Cranial Nerves: No cranial nerve deficit.  Psychiatric:        Mood and Affect: Mood normal.        Behavior: Behavior normal.        Thought Content: Thought content normal.        Judgment: Judgment normal.    Assessment/Plan: 1. Acute pain of right knee Patient given single prescription for hydrocodone/APAP 5/325mg  up to twice daily as needed for pain. Prescription for #10 tablets was sent to his pharmacy. He was encouraged to schedule appointment with orthopedic provider for further evaluation and treatment.  - HYDROcodone-acetaminophen (NORCO/VICODIN) 5-325 MG tablet; Take 1 tablet by mouth 2 (two) times daily as needed for moderate pain.  Dispense: 10 tablet; Refill: 0  2. Essential hypertension Stable. Continue bp medication as prescribed.   3. Chronic active hepatitis [facility]) Patient now seeing GI/hepatologist for management.   4.  Coronary artery disease due to lipid rich plaque Continue regular visits with cardiology as scheduled.   General Counseling: vernard gram understanding of the findings of todays visit and agrees with plan of treatment. I have discussed any further diagnostic evaluation that may be needed or ordered today. We also reviewed his medications today. he has been encouraged to call the office with any questions or concerns that should arise related to todays visit.  This patient was seen by Vincent Gros FNP Collaboration with Dr Lyndon Code as a part of collaborative care agreement  Meds ordered this encounter  Medications  . HYDROcodone-acetaminophen (NORCO/VICODIN) 5-325 MG tablet    Sig: Take 1 tablet by mouth 2 (two) times daily as needed for moderate pain.    Dispense:  10 tablet    Refill:  0    Order Specific Question:   Supervising Provider    Answer:   Lyndon Code [[id number]]    Total time spent: 30 Minutes   Time spent includes review of chart, medications, test results, and follow up plan with the patient.      Dr Lyndon Code Internal medicine

## 2020-08-20 ENCOUNTER — Telehealth: Payer: Self-pay | Admitting: *Deleted

## 2020-08-20 NOTE — Telephone Encounter (Signed)
Patient called reporting that he is applying for disability an--d that he needs a letter from Dr B faxed to [facility] disability stating his diagnosis and treatment Fax to [phone]

## 2020-08-20 NOTE — Telephone Encounter (Signed)
SS requesting for history purposes

## 2020-08-20 NOTE — Telephone Encounter (Signed)
Steward Drone- please inform pt that he needs to speak to PCP for his disability. I dont not think would qualify for disability for his diagnosis of hemochromatosis Claiborne Billings is the diagnosis that I see him for.] Thanks GB

## 2020-08-21 NOTE — Telephone Encounter (Signed)
Letter written per Dr. B and faxed over to [facility] to the number listed below. Pt is aware that this has been done.

## 2020-09-19 ENCOUNTER — Encounter: Payer: Self-pay | Admitting: Internal Medicine

## 2020-09-19 ENCOUNTER — Inpatient Hospital Stay: Payer: Medicaid Other | Attending: Internal Medicine

## 2020-09-19 ENCOUNTER — Inpatient Hospital Stay: Payer: Medicaid Other

## 2020-09-19 ENCOUNTER — Inpatient Hospital Stay (HOSPITAL_BASED_OUTPATIENT_CLINIC_OR_DEPARTMENT_OTHER): Payer: Medicaid Other | Admitting: Internal Medicine

## 2020-09-19 ENCOUNTER — Other Ambulatory Visit: Payer: Self-pay

## 2020-09-19 DIAGNOSIS — R7989 Other specified abnormal findings of blood chemistry: Secondary | ICD-10-CM

## 2020-09-19 DIAGNOSIS — K760 Fatty (change of) liver, not elsewhere classified: Secondary | ICD-10-CM | POA: Diagnosis not present

## 2020-09-19 LAB — CBC WITH DIFFERENTIAL/PLATELET
Abs Immature Granulocytes: 0.05 10*3/uL (ref 0.00–0.07)
Basophils Absolute: 0.2 10*3/uL — ABNORMAL HIGH (ref 0.0–0.1)
Basophils Relative: 1 %
Eosinophils Absolute: 0.5 10*3/uL (ref 0.0–0.5)
Eosinophils Relative: 5 %
HCT: 43.9 % (ref 39.0–52.0)
Hemoglobin: 15.6 g/dL (ref 13.0–17.0)
Immature Granulocytes: 0 %
Lymphocytes Relative: 24 %
Lymphs Abs: 2.7 10*3/uL (ref 0.7–4.0)
MCH: 32.5 pg (ref 26.0–34.0)
MCHC: 35.5 g/dL (ref 30.0–36.0)
MCV: 91.5 fL (ref 80.0–100.0)
Monocytes Absolute: 1.4 10*3/uL — ABNORMAL HIGH (ref 0.1–1.0)
Monocytes Relative: 13 %
Neutro Abs: 6.3 10*3/uL (ref 1.7–7.7)
Neutrophils Relative %: 57 %
Platelets: 130 10*3/uL — ABNORMAL LOW (ref 150–400)
RBC: 4.8 MIL/uL (ref 4.22–5.81)
RDW: 12.1 % (ref 11.5–15.5)
WBC: 11.2 10*3/uL — ABNORMAL HIGH (ref 4.0–10.5)
nRBC: 0 % (ref 0.0–0.2)

## 2020-09-19 LAB — COMPREHENSIVE METABOLIC PANEL
ALT: 22 U/L (ref 0–44)
AST: 28 U/L (ref 15–41)
Albumin: 3.4 g/dL — ABNORMAL LOW (ref 3.5–5.0)
Alkaline Phosphatase: 72 U/L (ref 38–126)
Anion gap: 9 (ref 5–15)
BUN: 18 mg/dL (ref 8–23)
CO2: 25 mmol/L (ref 22–32)
Calcium: 9.3 mg/dL (ref 8.9–10.3)
Chloride: 102 mmol/L (ref 98–111)
Creatinine, Ser: 0.87 mg/dL (ref 0.61–1.24)
GFR, Estimated: >60 mL/min (ref >60)
Glucose, Bld: 109 mg/dL — ABNORMAL HIGH (ref 70–99)
Potassium: 3.5 mmol/L (ref 3.5–5.1)
Sodium: 136 mmol/L (ref 135–145)
Total Bilirubin: 1.1 mg/dL (ref 0.3–1.2)
Total Protein: 7.6 g/dL (ref 6.5–8.1)

## 2020-09-19 LAB — FERRITIN: Ferritin: 20 ng/mL — ABNORMAL LOW (ref 24–336)

## 2020-09-19 NOTE — Progress Notes (Signed)
[facility] CONSULT NOTE  Patient Care Team: Carlean Jews, NP as PCP - General (Family Medicine)  CHIEF COMPLAINTS/PURPOSE OF CONSULTATION: Hemochromatosis.   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 Two copies of H63D were identified.  C282Y and S65C were negative;Elevated Ferritin/I sat-88%;  SEP 2021- s/p Phlebotomy x3  # Elevated AFP- 73 [SEP 2021]  # Cirrhosis/ HEP C [started end of sep,2021 x 12 weeks; Dr.Wohl]  # CAD- on xarelto.   HISTORY OF PRESENTING ILLNESS:  Gabriel Hoffman 64 y.o.  male history of hepatitis C/cirrhosis splenomegaly and history of hemochromatosis/elevated iron saturation/ferritin is here for follow-up  In the interim patient status post phlebotomy x3.  Patient tolerated phlebotomy fairly well.  Patient joint pains/fatigue fairly stable.  Denies any nausea vomiting.  No fever no chills.   Review of Systems  Constitutional: Positive for malaise/fatigue. Negative for chills, diaphoresis, fever and weight loss.  HENT: Negative for nosebleeds and sore throat.   Eyes: Negative for double vision.  Respiratory: Negative for cough, hemoptysis, sputum production, shortness of breath and wheezing.   Cardiovascular: Negative for chest pain, palpitations, orthopnea and leg swelling.  Gastrointestinal: Negative for abdominal pain, blood in stool, constipation, diarrhea, heartburn, melena, nausea and vomiting.  Genitourinary: Negative for dysuria, frequency and urgency.  Musculoskeletal: Positive for back pain, joint pain and myalgias.  Skin: Negative.  Negative for itching and rash.  Neurological: Negative for dizziness, tingling, focal weakness, weakness and headaches.  Endo/Heme/Allergies: Does not bruise/bleed easily.  Psychiatric/Behavioral: Negative for depression. The patient is not nervous/anxious and does not  have insomnia.     MEDICAL HISTORY:  Past Medical History:  Diagnosis Date  . Cirrhosis (HCC) 06/15/2020  . Hepatitis C 06/15/2020  . Hypertension   . Thyroid disease     SURGICAL HISTORY: Past Surgical History:  Procedure Laterality Date  . CARDIAC CATHETERIZATION    . CATARACT EXTRACTION    . LEG SURGERY Right    rod in leg  . WRIST SURGERY Left     SOCIAL HISTORY: Social History   Socioeconomic History  . Marital status: Single    Spouse name: Not on file  . Number of children: Not on file  . Years of education: Not on file  . Highest education level: Not on file  Occupational History  . Not on file  Tobacco Use  . Smoking status: Current Every Day Smoker    Packs/day: 1.00    Types: Cigarettes  . Smokeless tobacco: Never Used  . Tobacco comment: pack last about 3 days now 08/12/20  Vaping Use  . Vaping Use: Never used  Substance and Sexual Activity  . Alcohol use: Never  . Drug use: Never  . Sexual activity: Not on file  Other Topics Concern  . 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    Social Determinants of Health   Financial Resource Strain:   . Difficulty of Paying Living Expenses: Not on file  Food Insecurity:   . Worried About Programme researcher, broadcasting/film/video in the Last Year: Not on file  . Ran Out of Food in the Last Year: Not on file  Transportation Needs:   . Lack of Transportation (Medical): Not on file  . Lack of Transportation (Non-Medical): Not on file  Physical Activity:   .  Days of Exercise per Week: Not on file  . Minutes of Exercise per Session: Not on file  Stress:   . Feeling of Stress : Not on file  Social Connections:   . Frequency of Communication with Friends and Family: Not on file  . Frequency of Social Gatherings with Friends and Family: Not on file  . Attends Religious Services: Not on file  . Active Member of Clubs or  Organizations: Not on file  . Attends Banker Meetings: Not on file  . Marital Status: Not on file  Intimate Partner Violence:   . Fear of Current or Ex-Partner: Not on file  . Emotionally Abused: Not on file  . Physically Abused: Not on file  . Sexually Abused: Not on file    FAMILY HISTORY: Family History  Problem Relation Age of Onset  . Cancer Mother   . COPD Mother   . Cancer Sister     ALLERGIES:  has No Known Allergies.  MEDICATIONS:  Current Outpatient Medications  Medication Sig Dispense Refill  . atorvastatin (LIPITOR) 10 MG tablet Take 1 tablet (10 mg total) by mouth daily. 30 tablet 5  . hydrochlorothiazide (HYDRODIURIL) 25 MG tablet Take 1 tablet (25 mg total) by mouth daily. 90 tablet 2  . levothyroxine (SYNTHROID) 112 MCG tablet Take 1 tablet (112 mcg total) by mouth daily before breakfast. 30 tablet 3  . metoprolol succinate (TOPROL-XL) 100 MG 24 hr tablet Take 1 tablet (100 mg total) by mouth daily. Take with or immediately following a meal. 30 tablet 3  . nitroGLYCERIN (NITRODUR - DOSED IN MG/24 HR) 0.4 mg/hr patch Place 0.4 mg onto the skin daily.    . pantoprazole (PROTONIX) 20 MG tablet Take 1 tablet (20 mg total) by mouth daily. 90 tablet 1  . rivaroxaban (XARELTO) 20 MG TABS tablet Take 1 tablet (20 mg total) by mouth daily with supper. 90 tablet 1  . HYDROcodone-acetaminophen (NORCO/VICODIN) 5-325 MG tablet Take 1 tablet by mouth 2 (two) times daily as needed for moderate pain. (Patient not taking: Reported on 09/19/2020) 10 tablet 0  . Sofosbuvir-Velpatasvir (EPCLUSA) 400-100 MG TABS Take 1 tablet by mouth daily. (Patient not taking: Reported on 09/19/2020) 91 tablet 0   No current facility-administered medications for this visit.      PHYSICAL EXAMINATION:   Vitals:   09/19/20 1120  BP: 140/83  Pulse: 77  Resp: 18  Temp: 97.7 F (36.5 C)  SpO2: 97%   Filed Weights   09/19/20 1120  Weight: 186 lb (84.4 kg)    Physical  Exam HENT:     Head: Normocephalic and atraumatic.     Mouth/Throat:     Pharynx: No oropharyngeal exudate.  Eyes:     Pupils: Pupils are equal, round, and reactive to light.  Cardiovascular:     Rate and Rhythm: Normal rate and regular rhythm.  Pulmonary:     Effort: No respiratory distress.     Breath sounds: No wheezing.     Comments: Decreased air entry bilaterally.  Abdominal:     General: Bowel sounds are normal. There is no distension.     Palpations: Abdomen is soft. There is no mass.     Tenderness: There is no abdominal tenderness. There is no guarding or rebound.  Musculoskeletal:        General: No tenderness. Normal range of motion.     Cervical back: Normal range of motion and neck supple.  Skin:    General: Skin  is warm.  Neurological:     Mental Status: He is alert and oriented to person, place, and time.  Psychiatric:        Mood and Affect: Affect normal.     LABORATORY DATA:  I have reviewed the data as listed Lab Results  Component Value Date   WBC 11.2 (H) 09/19/2020   HGB 15.6 09/19/2020   HCT 43.9 09/19/2020   MCV 91.5 09/19/2020   PLT 130 (L) 09/19/2020   Recent Labs    01/04/20 1310 01/04/20 1310 05/13/20 0833 05/13/20 0833 06/26/20 1609 07/25/20 1216 09/19/20 1028  NA 137   < > 138  --   --  138 136  K 3.9   < > 4.1  --   --  3.8 3.5  CL 102   < > 100  --   --  99 102  CO2 23   < > 27  --   --  29 25  GLUCOSE 102*   < > 94  --   --  115* 109*  BUN 13   < > 16  --   --  17 18  CREATININE 0.88   < > 0.95  --   --  0.91 0.87  CALCIUM 8.9   < > 9.1  --   --  9.5 9.3  GFRNONAA 93   < > 85  --   --  >60 >60  GFRAA 107  --  99  --   --  >60  --   PROT 7.1   < > 7.1   < > 7.3 7.8 7.6  ALBUMIN 3.5*   < > 3.6*   < > 3.3* 3.2* 3.4*  AST 64*   < > 116*   < > 124* 41 28  ALT 62*   < > 130*   < > 116* 44 22  ALKPHOS 140*   < > 127*   < > 150* 97 72  BILITOT 1.1   < > 1.4*   < > 1.6* 1.9* 1.1  BILIDIR  --   --   --   --  0.84*  --   --    <  > = values in this interval not displayed.     No results found.  Hemochromatosis, hereditary (HCC) # Hereditary hemochromatosis-[Elevated iron saturation-iron studies; unclear if patient has organ involvement-given the hepatitis C/cirrhosis]- s/p phelbtomy x3; November 2021-ferritin 20.  Hold phlebotomy.  No evidence of other organ dysfunction.  #Elevated AFP-question secondary cirrhosis versus others.  Ultrasound negative for any lesions.  Recommend abdominal MRI for further evaluation/baseline.  # Hepatitis C/cirrhosis-child Pugh-A- As per GI. On antiviral.  Stable.  # Active smoker- Discussed with the patient regarding the ill effects of smoking- including but not limited to cardiac lung and vascular diseases and malignancies. Counseled against smoking.  Benefit from lung cancer screening program. Discuss.   # DISPOSITION: will call - [phone] # NO Phlebotomy today. # MRI of liver with and without contrast- ASAP  # Follow up in 4 months-MD; labs- cbc/cmp;Ferritin;iron studies; AFP Possible phlebotomy- Dr.B   All questions were answered. The patient knows to call the clinic with any problems, questions or concerns.   Earna Coder, MD 09/21/2020 9:46 AM

## 2020-09-19 NOTE — Assessment & Plan Note (Addendum)
#   Hereditary hemochromatosis-[Elevated iron saturation-iron studies; unclear if patient has organ involvement-given the hepatitis C/cirrhosis]- s/p phelbtomy x3; November 2021-ferritin 20.  Hold phlebotomy.  No evidence of other organ dysfunction.  #Elevated AFP-question secondary cirrhosis versus others.  Ultrasound negative for any lesions.  Recommend abdominal MRI for further evaluation/baseline.  # Hepatitis C/cirrhosis-child Pugh-A- As per GI. On antiviral.  Stable.  # Active smoker- Discussed with the patient regarding the ill effects of smoking- including but not limited to cardiac lung and vascular diseases and malignancies. Counseled against smoking.  Benefit from lung cancer screening program. Discuss.   # DISPOSITION: will call - [phone] # NO Phlebotomy today. # MRI of liver with and without contrast- ASAP  # Follow up in 4 months-MD; labs- cbc/cmp;Ferritin;iron studies; AFP Possible phlebotomy- Dr.B

## 2020-09-19 NOTE — Progress Notes (Signed)
Pt in for follow up, denies any concerns today. 

## 2020-09-22 ENCOUNTER — Ambulatory Visit
Admission: RE | Admit: 2020-09-22 | Discharge: 2020-09-22 | Disposition: A | Discharge status: Home / Self Care | Payer: Medicaid Other | Source: Ambulatory Visit | Attending: Internal Medicine | Admitting: Internal Medicine

## 2020-09-22 ENCOUNTER — Other Ambulatory Visit: Payer: Self-pay

## 2020-09-22 DIAGNOSIS — K760 Fatty (change of) liver, not elsewhere classified: Secondary | ICD-10-CM | POA: Insufficient documentation

## 2020-09-22 IMAGING — MR MR ABDOMEN WO/W CM
18 series · 48 of 48 positions shown · IV contrast (gadavist)
Comparison: None.

CLINICAL DATA: Cirrhosis, elevated AFP

EXAM:
MRI ABDOMEN WITHOUT AND WITH CONTRAST
TECHNIQUE: Multiplanar multisequence MR imaging of the abdomen was performed
both before and after the administration of intravenous contrast.
CONTRAST:  8mL GADAVIST GADOBUTROL 1 MMOL/ML IV SOLN

[Series 3: T2 · coronal · 6.0mm · 1.19mm/px · 2 of 30 slices shown (1 of 2)]
[im 1/30]
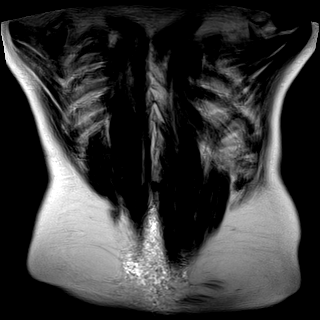
[im 30/30]
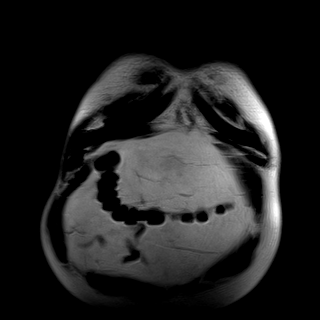

[Series 6: T2 fat-sat · axial · 6.0mm · 1.19mm/px · z∈[-130,+108]mm · 2 of 34 slices shown]
[im 1/34]
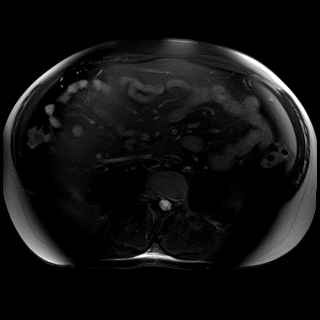
[im 34/34]
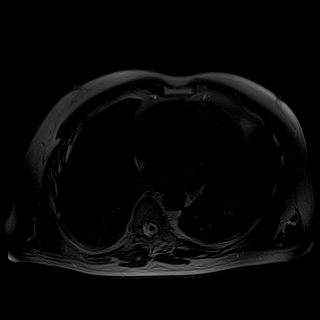

[Series 7: T2 · axial · 6.0mm · 1.19mm/px · z∈[-115,+94]mm · 2 of 30 slices shown (2 of 2)]
[im 1/30]
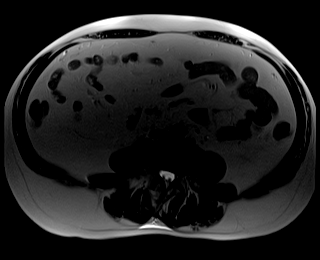
[im 30/30]
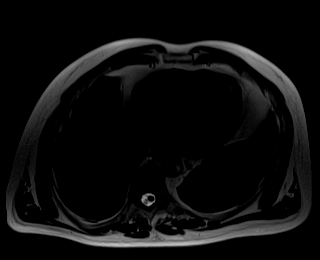

[Series 8: ax dwi_tracew · axial · 6.0mm · 1.42mm/px · z∈[-130,+108]mm · 5 of 102 slices shown]
[im 1/102]
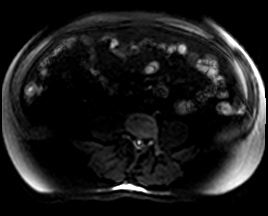
[im 26/102]
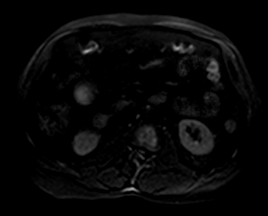
[im 51/102]
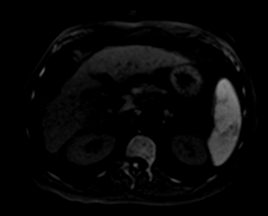
[im 76/102]
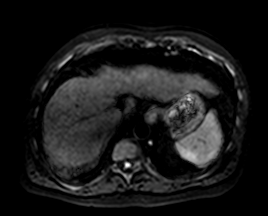
[im 102/102]
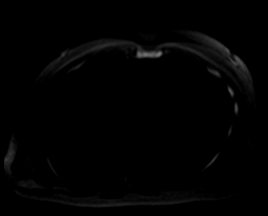

[Series 9: ax dwi_adc · axial · 6.0mm · 1.42mm/px · z∈[-130,+108]mm · 2 of 34 slices shown]
[im 1/34]
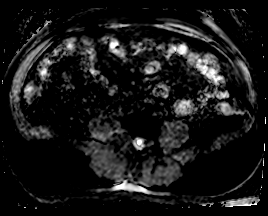
[im 34/34]
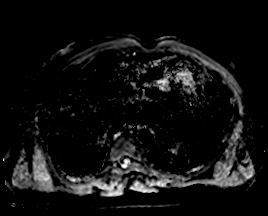

[Series 11: bSSFP · axial · 6.0mm · 0.74mm/px · z∈[-122,+101]mm · 2 of 32 slices shown]
[im 1/32]
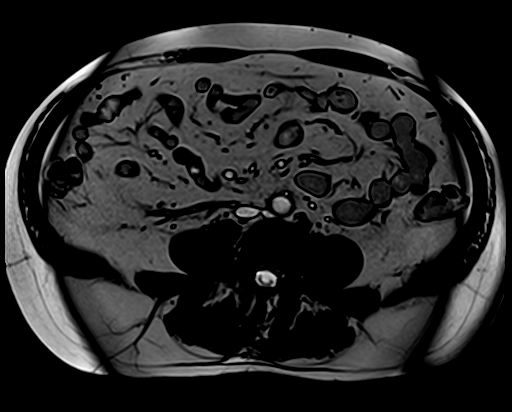
[im 32/32]
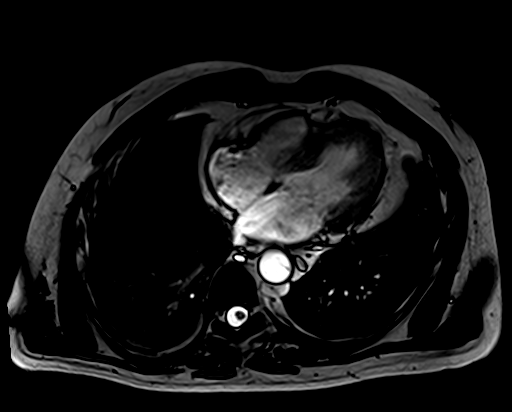

[Series 13: T1 dynamic fat-sat · axial · non-contrast · 3.0mm · 1.19mm/px · z∈[-129,+108]mm · 4 of 80 slices shown (1 of 5)]
[im 1/80]
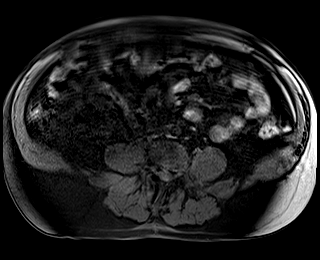
[im 27/80]
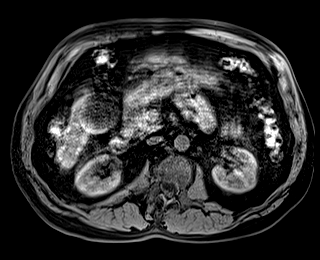
[im 53/80]
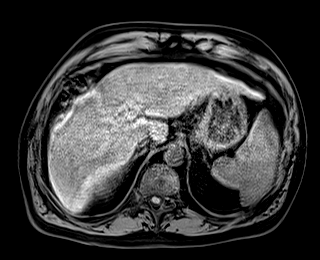
[im 80/80]
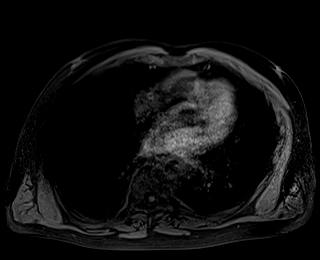

[Series 14: T1 dynamic fat-sat post-contrast · axial · 3.0mm · 1.19mm/px · z∈[-129,+108]mm · 3 of 80 slices shown (1 of 4)]
[im 1/80]
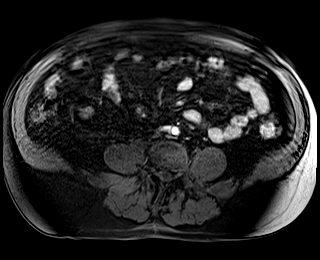
[im 40/80]
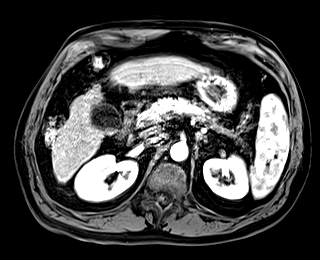
[im 80/80]
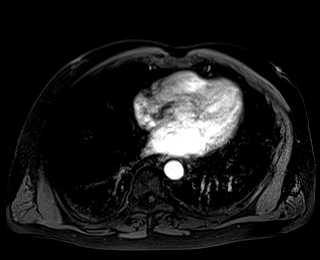

[Series 15: T1 dynamic fat-sat · axial · 3.0mm · 1.19mm/px · z∈[-129,+108]mm · 3 of 80 slices shown (2 of 5)]
[im 1/80]
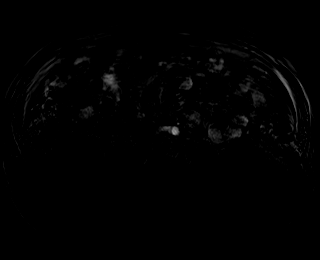
[im 40/80]
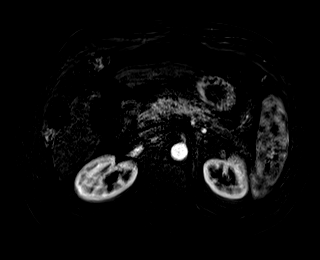
[im 80/80]
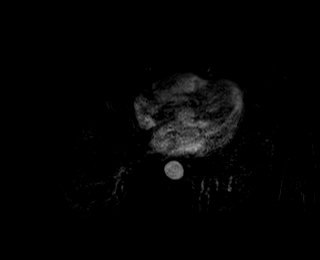

[Series 16: T1 dynamic fat-sat post-contrast · axial · 3.0mm · 1.19mm/px · z∈[-129,+108]mm · 3 of 80 slices shown (2 of 4)]
[im 1/80]
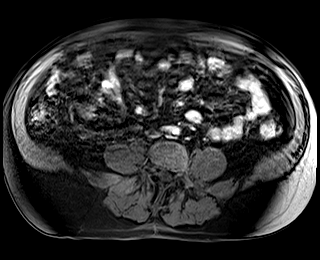
[im 40/80]
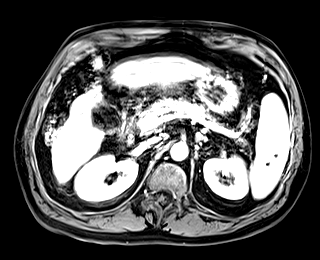
[im 80/80]
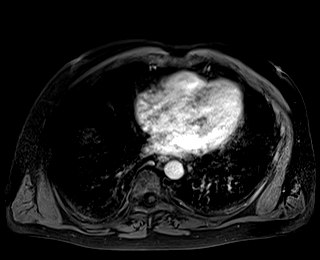

[Series 17: T1 dynamic fat-sat · axial · 3.0mm · 1.19mm/px · z∈[-129,+108]mm · 3 of 80 slices shown (3 of 5)]
[im 1/80]
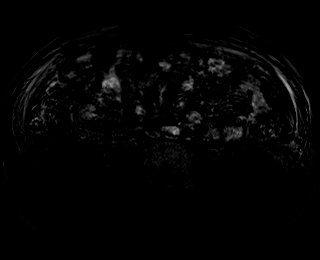
[im 40/80]
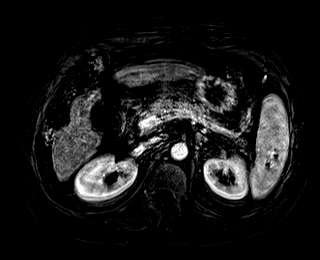
[im 80/80]
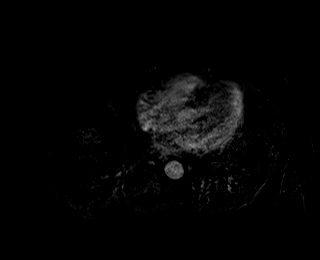

[Series 18: T1 dynamic fat-sat post-contrast · axial · 3.0mm · 1.19mm/px · z∈[-129,+108]mm · 3 of 80 slices shown (3 of 4)]
[im 1/80]
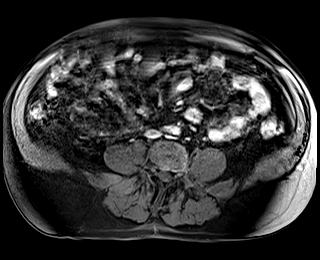
[im 40/80]
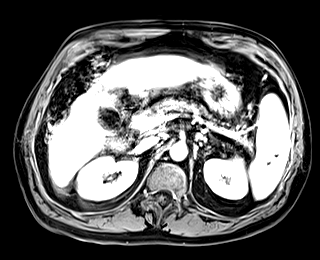
[im 80/80]
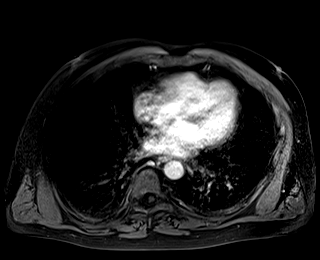

[Series 19: T1 dynamic fat-sat · axial · 3.0mm · 1.19mm/px · z∈[-129,+108]mm · 3 of 80 slices shown (4 of 5)]
[im 1/80]
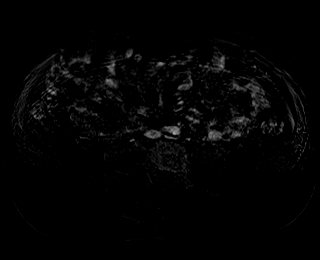
[im 40/80]
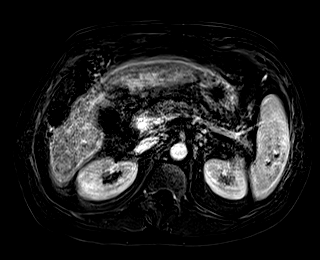
[im 80/80]
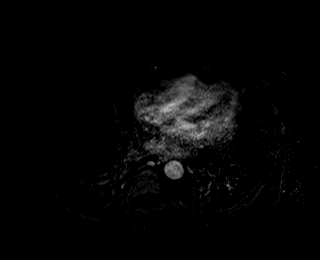

[Series 20: T1 dynamic post-contrast · coronal · 3.0mm · 1.31mm/px · 3 of 72 slices shown]
[im 1/72]
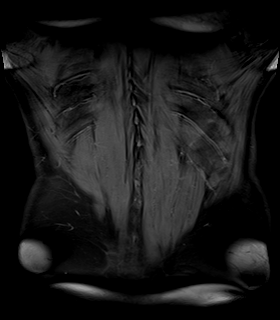
[im 36/72]
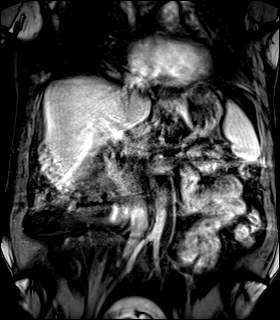
[im 72/72]
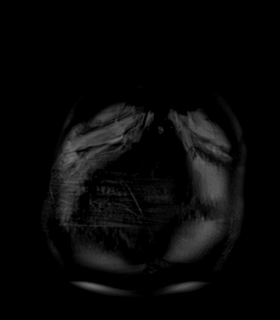

[Series 21: T1 dynamic fat-sat post-contrast · axial · 3.0mm · 1.19mm/px · z∈[-129,+108]mm · 3 of 80 slices shown (4 of 4)]
[im 1/80]
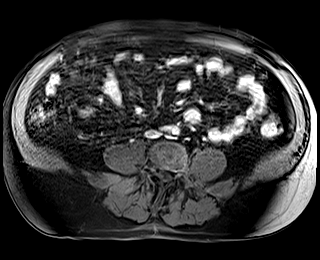
[im 40/80]
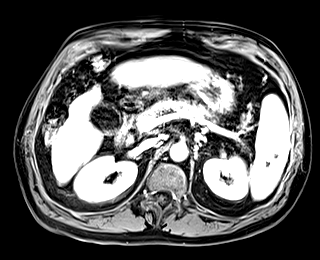
[im 80/80]
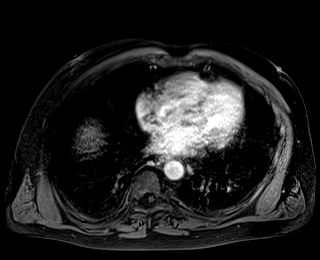

[Series 22: T1 dynamic fat-sat · axial · 3.0mm · 1.19mm/px · z∈[-129,+108]mm · 3 of 80 slices shown (5 of 5)]
[im 1/80]
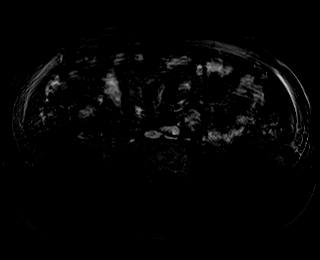
[im 40/80]
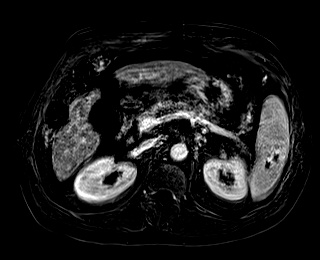
[im 80/80]
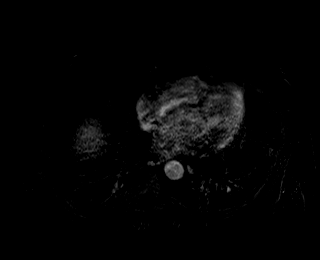

[Series 1014: T1 · axial · 6.0mm · 0.74mm/px · 1 of 32 slices shown (1 of 2)]
[im 1/32]
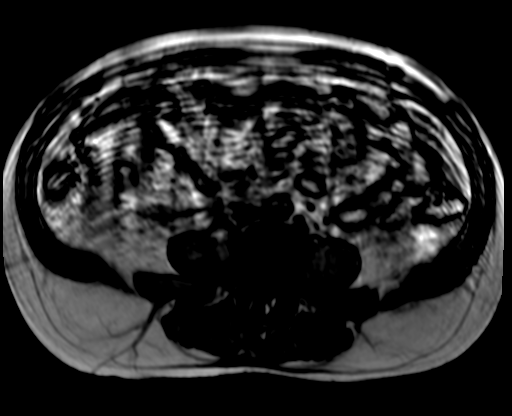

[Series 1021: T1 · axial · 6.0mm · 0.74mm/px · 1 of 32 slices shown (2 of 2)]
[im 1/32]
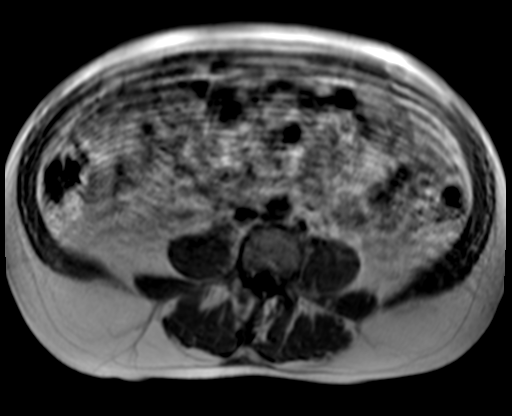

[48 of 48 positions shown; findings below may reference images not displayed]

FINDINGS: Examination is generally limited by breath motion artifact
throughout.

Lower chest: No acute findings.

Hepatobiliary: Cirrhotic morphology of the liver with a coarse,
nodular contour. No focal liver lesions. No mass or other
parenchymal abnormality identified.

Pancreas: No mass, inflammatory changes, or other parenchymal
abnormality identified.

Spleen: Spleen is at the upper limit of normal in size small
incidental calcifications of the splenic parenchyma.

Adrenals/Urinary Tract: No masses identified. No evidence of
hydronephrosis.

Stomach/Bowel: Visualized portions within the abdomen are
unremarkable.

Vascular/Lymphatic: No pathologically enlarged lymph nodes
identified. No abdominal aortic aneurysm demonstrated.

Other:  None.

Musculoskeletal: No suspicious bone lesions identified.
IMPRESSION: Examination is generally limited by breath motion artifact
throughout. Within this limitation, there is cirrhotic morphology of
the liver. No focal liver lesions identified.

## 2020-09-22 MED ORDER — GADOBUTROL 1 MMOL/ML IV SOLN
8.0000 mL | Freq: Once | INTRAVENOUS | Status: AC | PRN
  Administered 2020-09-22: 8 mL via INTRAVENOUS

## 2020-09-24 NOTE — Progress Notes (Signed)
Hi Gabriel Hoffman: MRI of the liver does not show any concerns for cancer. Will follow up as planned. No new recommendations.  Happy Thanksgiving.

## 2020-10-08 ENCOUNTER — Telehealth: Payer: Self-pay

## 2020-10-08 NOTE — Telephone Encounter (Signed)
Gabriel Hoffman for referral

## 2020-10-20 ENCOUNTER — Other Ambulatory Visit: Payer: Self-pay

## 2020-10-20 DIAGNOSIS — I10 Essential (primary) hypertension: Secondary | ICD-10-CM

## 2020-10-20 MED ORDER — METOPROLOL SUCCINATE ER 100 MG PO TB24
100.0000 mg | ORAL_TABLET | Freq: Every day | ORAL | 0 refills | Status: DC
Start: 2020-10-20 — End: 2021-01-27

## 2020-10-20 MED ORDER — HYDROCHLOROTHIAZIDE 25 MG PO TABS: 25.0000 mg | ORAL_TABLET | Freq: Every day | ORAL | 0 refills | Status: DC

## 2020-12-01 ENCOUNTER — Other Ambulatory Visit: Payer: Self-pay

## 2020-12-01 DIAGNOSIS — E039 Hypothyroidism, unspecified: Secondary | ICD-10-CM

## 2020-12-01 MED ORDER — PANTOPRAZOLE SODIUM 20 MG PO TBEC
20.0000 mg | DELAYED_RELEASE_TABLET | Freq: Every day | ORAL | 1 refills | Status: DC
Start: 2020-12-01 — End: 2021-04-30

## 2020-12-01 MED ORDER — LEVOTHYROXINE SODIUM 112 MCG PO TABS: 112.0000 ug | ORAL_TABLET | Freq: Every day | ORAL | 3 refills | Status: DC

## 2020-12-18 ENCOUNTER — Encounter: Payer: Self-pay | Admitting: Hospice and Palliative Medicine

## 2020-12-18 ENCOUNTER — Other Ambulatory Visit: Payer: Self-pay

## 2020-12-18 ENCOUNTER — Ambulatory Visit (INDEPENDENT_AMBULATORY_CARE_PROVIDER_SITE_OTHER): Payer: Medicaid Other | Admitting: Hospice and Palliative Medicine

## 2020-12-18 VITALS — BP 149/86 | HR 77 | Temp 97.8°F | Resp 16 | Ht 66.0 in | Wt 180.6 lb

## 2020-12-18 DIAGNOSIS — F411 Generalized anxiety disorder: Secondary | ICD-10-CM

## 2020-12-18 DIAGNOSIS — F5102 Adjustment insomnia: Secondary | ICD-10-CM

## 2020-12-18 DIAGNOSIS — Z79899 Other long term (current) drug therapy: Secondary | ICD-10-CM

## 2020-12-18 DIAGNOSIS — R079 Chest pain, unspecified: Secondary | ICD-10-CM | POA: Diagnosis not present

## 2020-12-18 DIAGNOSIS — E039 Hypothyroidism, unspecified: Secondary | ICD-10-CM

## 2020-12-18 MED ORDER — ESCITALOPRAM OXALATE 5 MG PO TABS: 5.0000 mg | ORAL_TABLET | Freq: Every day | ORAL | 1 refills | Status: DC

## 2020-12-18 MED ORDER — ALPRAZOLAM 0.25 MG PO TABS: 0.2500 mg | ORAL_TABLET | Freq: Every evening | ORAL | 0 refills | Status: DC | PRN

## 2020-12-18 NOTE — Progress Notes (Unsigned)
[facility] [address] [place], [place] [address]  Internal MEDICINE  Office Visit Note  Patient Name: Gabriel Hoffman  [id number]  [id number]  Date of Service: 12/22/2020  Chief Complaint  Patient presents with  . Acute Visit    Not sleeping     HPI Patient is here for acute visit C/o uncontrolled anxiety and depression as well as insomnia Has been incarcerated--during his time in prison his father and his sister passed away, he is struggling with coping with this as he was not able to spend the time he wanted to with his family before they passed away due to being incarcerated  When he lies down to go to sleep he gets an overwhelming feeling, panic attack, chest becomes tight and becomes short of breath These symptoms have been going on for several weeks--he has been trying to deal with it but has been without restful sleep for over a week  Current Medication: Outpatient Encounter Medications as of 12/18/2020  Medication Sig  . ALPRAZolam (XANAX) 0.25 MG tablet Take 1 tablet (0.25 mg total) by mouth at bedtime as needed for anxiety.  Marland Kitchen escitalopram (LEXAPRO) 5 MG tablet Take 1 tablet (5 mg total) by mouth daily.  Marland Kitchen atorvastatin (LIPITOR) 10 MG tablet Take 1 tablet (10 mg total) by mouth daily.  . hydrochlorothiazide (HYDRODIURIL) 25 MG tablet Take 1 tablet (25 mg total) by mouth daily.  Marland Kitchen levothyroxine (SYNTHROID) 112 MCG tablet Take 1 tablet (112 mcg total) by mouth daily before breakfast.  . metoprolol succinate (TOPROL-XL) 100 MG 24 hr tablet Take 1 tablet (100 mg total) by mouth daily. Take with or immediately following a meal.  . nitroGLYCERIN (NITRODUR - DOSED IN MG/24 HR) 0.4 mg/hr patch Place 0.4 mg onto the skin daily.  . pantoprazole (PROTONIX) 20 MG tablet Take 1 tablet (20 mg total) by mouth daily.  . rivaroxaban (XARELTO) 20 MG TABS tablet Take 1 tablet (20 mg total) by mouth daily with supper.  . [DISCONTINUED] HYDROcodone-acetaminophen  (NORCO/VICODIN) 5-325 MG tablet Take 1 tablet by mouth 2 (two) times daily as needed for moderate pain. (Patient not taking: Reported on 09/19/2020)   No facility-administered encounter medications on file as of 12/18/2020.    Surgical History: Past Surgical History:  Procedure Laterality Date  . CARDIAC CATHETERIZATION    . CATARACT EXTRACTION    . LEG SURGERY Right    rod in leg  . WRIST SURGERY Left     Medical History: Past Medical History:  Diagnosis Date  . Cirrhosis (HCC) 06/15/2020  . Hepatitis C 06/15/2020  . Hypertension   . Thyroid disease     Family History: Family History  Problem Relation Age of Onset  . Cancer Mother   . COPD Mother   . Cancer Sister     Social History   Socioeconomic History  . Marital status: Single    Spouse name: Not on file  . Number of children: Not on file  . Years of education: Not on file  . Highest education level: Not on file  Occupational History  . Not on file  Tobacco Use  . Smoking status: Current Every Day Smoker    Packs/day: 1.00    Types: Cigarettes  . Smokeless tobacco: Never Used  . Tobacco comment: pack last about 3 days now 08/12/20  Vaping Use  . Vaping Use: Never used  Substance and Sexual Activity  . Alcohol use: Never  . Drug use: Never  . Sexual activity:  Not on file  Other Topics Concern  . 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    Social Determinants of Health   Financial Resource Strain: Not on file  Food Insecurity: Not on file  Transportation Needs: Not on file  Physical Activity: Not on file  Stress: Not on file  Social Connections: Not on file  Intimate Partner Violence: Not on file      Review of Systems  Constitutional: Negative for chills, fatigue and unexpected weight change.  HENT: Negative for congestion, postnasal drip, rhinorrhea, sneezing and sore throat.   Eyes:  Negative for redness.  Respiratory: Positive for shortness of breath. Negative for cough and chest tightness.   Cardiovascular: Positive for chest pain. Negative for palpitations.  Gastrointestinal: Negative for abdominal pain, constipation, diarrhea, nausea and vomiting.  Genitourinary: Negative for dysuria and frequency.  Musculoskeletal: Negative for arthralgias, back pain, joint swelling and neck pain.  Skin: Negative for rash.  Neurological: Negative for tremors and numbness.  Hematological: Negative for adenopathy. Does not bruise/bleed easily.  Psychiatric/Behavioral: Positive for behavioral problems (Depression) and sleep disturbance. Negative for suicidal ideas. The patient is nervous/anxious.     Vital Signs: BP (!) 149/86   Pulse 77   Temp 97.8 F (36.6 C)   Resp 16   Ht 5\' 6"  (1.676 m)   Wt 180 lb 9.6 oz (81.9 kg)   SpO2 96%   BMI 29.15 kg/m    Physical Exam Vitals reviewed.  Constitutional:      Appearance: Normal appearance. He is normal weight.  Cardiovascular:     Rate and Rhythm: Normal rate and regular rhythm.     Pulses: Normal pulses.     Heart sounds: Normal heart sounds.  Pulmonary:     Effort: Pulmonary effort is normal.     Breath sounds: Normal breath sounds.  Musculoskeletal:        General: Normal range of motion.     Cervical back: Normal range of motion.  Skin:    General: Skin is warm.  Neurological:     General: No focal deficit present.     Mental Status: He is alert and oriented to person, place, and time. Mental status is at baseline.  Psychiatric:        Mood and Affect: Mood normal.        Behavior: Behavior normal.        Thought Content: Thought content normal.        Judgment: Judgment normal.    Assessment/Plan: 1. Chest pain, unspecified type NSR--chest pain likely related to uncontrolled anxiety and panic attacks - EKG 12-Lead  2. GAD (generalized anxiety disorder) Start lexapro Review thyroid levels for potential  underlying cause of GAD Low dose alprazolam for insomnia [place] Controlled Substance Database was reviewed by me for overdose risk score (ORS) - escitalopram (LEXAPRO) 5 MG tablet; Take 1 tablet (5 mg total) by mouth daily.  Dispense: 30 tablet; Refill: 1 - ALPRAZolam (XANAX) 0.25 MG tablet; Take 1 tablet (0.25 mg total) by mouth at bedtime as needed for anxiety.  Dispense: 30 tablet; Refill: 0 - TSH + free T4  3. Adjustment insomnia Low dose alprazolam, advised to use as needed  4. High risk medication -POCT urine drug screen   General Counseling: coburn knaus understanding of the findings of todays visit and agrees with plan of treatment. I have discussed any further diagnostic  evaluation that may be needed or ordered today. We also reviewed his medications today. he has been encouraged to call the office with any questions or concerns that should arise related to todays visit.    Orders Placed This Encounter  Procedures  . TSH + free T4  . POCT Urine Drug Screen  . EKG 12-Lead    Meds ordered this encounter  Medications  . escitalopram (LEXAPRO) 5 MG tablet    Sig: Take 1 tablet (5 mg total) by mouth daily.    Dispense:  30 tablet    Refill:  1  . ALPRAZolam (XANAX) 0.25 MG tablet    Sig: Take 1 tablet (0.25 mg total) by mouth at bedtime as needed for anxiety.    Dispense:  30 tablet    Refill:  0    Time spent: 30 Minutes Time spent includes review of chart, medications, test results and follow-up plan with the patient.  This patient was seen by Leeanne Deed AGNP-C in Collaboration with Dr Lyndon Code as a part of collaborative care agreement     Lubertha Basque. Donell Sliwinski AGNP-C Internal medicine

## 2020-12-22 LAB — POCT URINE DRUG SCREEN
Methylenedioxyamphetamine: NOT DETECTED
POC Amphetamine UR: NOT DETECTED
POC BENZODIAZEPINES UR: NOT DETECTED
POC Barbiturate UR: NOT DETECTED
POC Cocaine UR: NOT DETECTED
POC Ecstasy UR: NOT DETECTED
POC Marijuana UR: NOT DETECTED
POC Methadone UR: NOT DETECTED
POC Methamphetamine UR: NOT DETECTED
POC Opiate Ur: NOT DETECTED
POC Oxycodone UR: NOT DETECTED
POC PHENCYCLIDINE UR: NOT DETECTED
POC TRICYCLICS UR: NOT DETECTED

## 2020-12-23 LAB — TSH+FREE T4
Free T4: 1.4 ng/dL (ref 0.82–1.77)
TSH: 7.29 u[IU]/mL — ABNORMAL HIGH (ref 0.450–4.500)

## 2020-12-23 NOTE — Progress Notes (Signed)
Labs reviewed, will be discussed at next visit.

## 2021-01-12 ENCOUNTER — Other Ambulatory Visit: Payer: Self-pay

## 2021-01-12 ENCOUNTER — Encounter: Payer: Self-pay | Admitting: Hospice and Palliative Medicine

## 2021-01-12 ENCOUNTER — Ambulatory Visit: Payer: Medicaid Other | Admitting: Hospice and Palliative Medicine

## 2021-01-12 VITALS — BP 138/82 | HR 80 | Temp 97.7°F | Resp 16 | Ht 66.0 in | Wt 183.0 lb

## 2021-01-12 DIAGNOSIS — F411 Generalized anxiety disorder: Secondary | ICD-10-CM

## 2021-01-12 DIAGNOSIS — F5101 Primary insomnia: Secondary | ICD-10-CM | POA: Diagnosis not present

## 2021-01-12 DIAGNOSIS — I10 Essential (primary) hypertension: Secondary | ICD-10-CM

## 2021-01-12 DIAGNOSIS — T161XXA Foreign body in right ear, initial encounter: Secondary | ICD-10-CM

## 2021-01-12 MED ORDER — BUPROPION HCL ER (XL) 150 MG PO TB24: 150.0000 mg | ORAL_TABLET | Freq: Every day | ORAL | 0 refills | Status: DC

## 2021-01-12 MED ORDER — TRAZODONE HCL 50 MG PO TABS
25.0000 mg | ORAL_TABLET | Freq: Every evening | ORAL | 3 refills | Status: DC | PRN
Start: 2021-01-12 — End: 2021-04-30

## 2021-01-12 MED ORDER — ALPRAZOLAM 0.25 MG PO TABS
0.2500 mg | ORAL_TABLET | Freq: Every evening | ORAL | 0 refills | Status: DC | PRN
Start: 2021-01-12 — End: 2021-02-12

## 2021-01-12 NOTE — Progress Notes (Signed)
[facility] [address] [place], [place] [address]  Internal MEDICINE  Office Visit Note  Patient Name: Gabriel Hoffman  [id number]  [id number]  Date of Service: 01/13/2021  Chief Complaint  Patient presents with  . Follow-up    Pt has piece of cotton swab stuck in right ear, refill request  . Hypertension    HPI Patient is here for routine follow-up Cotton swab tip stuck in right ear--has been in place since earlier today, hearing is diminished, denies pain--ear feels full At last visit was started on Lexapro and given alprazolam as needed to bridge lexapro getting into his system--confusion as he has stopped Lexapro and is taking alprazolam nightly to help with sleep Reports lexapro gave him nightmares--he was talking to people that have passed away   Current Medication: Outpatient Encounter Medications as of 01/12/2021  Medication Sig  . buPROPion (WELLBUTRIN XL) 150 MG 24 hr tablet Take 1 tablet (150 mg total) by mouth daily.  . traZODone (DESYREL) 50 MG tablet Take 0.5-1 tablets (25-50 mg total) by mouth at bedtime as needed for sleep.  Marland Kitchen ALPRAZolam (XANAX) 0.25 MG tablet Take 1 tablet (0.25 mg total) by mouth at bedtime as needed for anxiety.  Marland Kitchen atorvastatin (LIPITOR) 10 MG tablet Take 1 tablet (10 mg total) by mouth daily.  . hydrochlorothiazide (HYDRODIURIL) 25 MG tablet Take 1 tablet (25 mg total) by mouth daily.  Marland Kitchen levothyroxine (SYNTHROID) 112 MCG tablet Take 1 tablet (112 mcg total) by mouth daily before breakfast.  . metoprolol succinate (TOPROL-XL) 100 MG 24 hr tablet Take 1 tablet (100 mg total) by mouth daily. Take with or immediately following a meal.  . nitroGLYCERIN (NITRODUR - DOSED IN MG/24 HR) 0.4 mg/hr patch Place 0.4 mg onto the skin daily.  . pantoprazole (PROTONIX) 20 MG tablet Take 1 tablet (20 mg total) by mouth daily.  . rivaroxaban (XARELTO) 20 MG TABS tablet Take 1 tablet (20 mg total) by mouth daily with supper.  . [DISCONTINUED]  ALPRAZolam (XANAX) 0.25 MG tablet Take 1 tablet (0.25 mg total) by mouth at bedtime as needed for anxiety.  . [DISCONTINUED] escitalopram (LEXAPRO) 5 MG tablet Take 1 tablet (5 mg total) by mouth daily.   No facility-administered encounter medications on file as of 01/12/2021.    Surgical History: Past Surgical History:  Procedure Laterality Date  . CARDIAC CATHETERIZATION    . CATARACT EXTRACTION    . LEG SURGERY Right    rod in leg  . WRIST SURGERY Left     Medical History: Past Medical History:  Diagnosis Date  . Cirrhosis (HCC) 06/15/2020  . Hepatitis C 06/15/2020  . Hypertension   . Thyroid disease     Family History: Family History  Problem Relation Age of Onset  . Cancer Mother   . COPD Mother   . Cancer Sister     Social History   Socioeconomic History  . Marital status: Single    Spouse name: Not on file  . Number of children: Not on file  . Years of education: Not on file  . Highest education level: Not on file  Occupational History  . Not on file  Tobacco Use  . Smoking status: Current Every Day Smoker    Packs/day: 1.00    Types: Cigarettes  . Smokeless tobacco: Never Used  . Tobacco comment: pack last about 4 days now 01-12-2021  Vaping Use  . Vaping Use: Never used  Substance and Sexual Activity  . Alcohol use:  Never  . Drug use: Never  . Sexual activity: Not on file  Other Topics Concern  . Not on file  Social History Narrative   Alcohol/beer once/twice a month; 1/2 ppd; used to work in [employer]. In [place]; with sister.    Social Determinants of Health   Financial Resource Strain: Not on file  Food Insecurity: Not on file  Transportation Needs: Not on file  Physical Activity: Not on file  Stress: Not on file  Social Connections: Not on file  Intimate Partner Violence: Not on file      Review of Systems  Constitutional: Negative for chills, fatigue and unexpected weight change.  HENT: Negative for congestion, postnasal  drip, rhinorrhea, sneezing and sore throat.        Diminished hearing to right ear, foreign body in ear  Eyes: Negative for redness.  Respiratory: Negative for cough, chest tightness and shortness of breath.   Cardiovascular: Negative for chest pain and palpitations.  Gastrointestinal: Negative for abdominal pain, constipation, diarrhea, nausea and vomiting.  Genitourinary: Negative for dysuria and frequency.  Musculoskeletal: Negative for arthralgias, back pain, joint swelling and neck pain.  Skin: Negative for rash.  Neurological: Negative for tremors and numbness.  Hematological: Negative for adenopathy. Does not bruise/bleed easily.  Psychiatric/Behavioral: Negative for behavioral problems (Depression), sleep disturbance and suicidal ideas. The patient is not nervous/anxious.     Vital Signs: BP 138/82   Pulse 80   Temp 97.7 F (36.5 C)   Resp 16   Ht 5\' 6"  (1.676 m)   Wt 183 lb (83 kg)   SpO2 97%   BMI 29.54 kg/m    Physical Exam Vitals reviewed.  Constitutional:      Appearance: Normal appearance. He is normal weight.  HENT:     Right Ear: A foreign body is present.     Ears:     Comments: Unable to remove with curette Cardiovascular:     Rate and Rhythm: Normal rate and regular rhythm.     Pulses: Normal pulses.     Heart sounds: Normal heart sounds.  Pulmonary:     Effort: Pulmonary effort is normal.     Breath sounds: Normal breath sounds.  Abdominal:     General: Abdomen is flat.     Palpations: Abdomen is soft.  Musculoskeletal:        General: Normal range of motion.     Cervical back: Normal range of motion.  Skin:    General: Skin is warm.  Neurological:     General: No focal deficit present.     Mental Status: He is alert and oriented to person, place, and time. Mental status is at baseline.  Psychiatric:        Mood and Affect: Mood normal.        Behavior: Behavior normal.        Thought Content: Thought content normal.        Judgment:  Judgment normal.   Assessment/Plan: 1. Foreign body in right auditory canal, initial encounter - Ambulatory referral to ENT  2. GAD (generalized anxiety disorder) Start Wellbutrin daily--unable to tolerate Lexapro Advised alprazolam is to be taken as needed--not on a routine basis, once anxiety well controlled will no longer provide routine scripts for [facility] [place] Controlled Substance Database was reviewed by me for overdose risk score (ORS) Reviewed risks and possible side effects associated with taking opiates, benzodiazepines and other CNS depressants. Combination of these could cause dizziness and drowsiness. Advised patient not  to drive or operate machinery when taking these medications, as patient's and other's life can be at risk and will have consequences. Patient verbalized understanding in this matter. Dependence and abuse for these drugs will be monitored closely. A Controlled substance policy and procedure is on file which allows [facility] to order a urine drug screen test at any visit. Patient understands and agrees with the plan - buPROPion (WELLBUTRIN XL) 150 MG 24 hr tablet; Take 1 tablet (150 mg total) by mouth daily.  Dispense: 30 tablet; Refill: 0 - ALPRAZolam (XANAX) 0.25 MG tablet; Take 1 tablet (0.25 mg total) by mouth at bedtime as needed for anxiety.  Dispense: 30 tablet; Refill: 0  3. Essential hypertension BP and HR well controlled, continue to monitor  4. Primary insomnia Try Trazodone to help with insomnia--advised alprazolam is to be taken as needed, not on a routine basis, will work on controlling anxiety likely contributing to insomnia - traZODone (DESYREL) 50 MG tablet; Take 0.5-1 tablets (25-50 mg total) by mouth at bedtime as needed for sleep.  Dispense: 30 tablet; Refill: 3  General Counseling: jabreel chimento understanding of the findings of todays visit and agrees with plan of treatment. I have discussed any further diagnostic evaluation that may  be needed or ordered today. We also reviewed his medications today. he has been encouraged to call the office with any questions or concerns that should arise related to todays visit.    Orders Placed This Encounter  Procedures  . Ambulatory referral to ENT    Meds ordered this encounter  Medications  . buPROPion (WELLBUTRIN XL) 150 MG 24 hr tablet    Sig: Take 1 tablet (150 mg total) by mouth daily.    Dispense:  30 tablet    Refill:  0  . traZODone (DESYREL) 50 MG tablet    Sig: Take 0.5-1 tablets (25-50 mg total) by mouth at bedtime as needed for sleep.    Dispense:  30 tablet    Refill:  3  . ALPRAZolam (XANAX) 0.25 MG tablet    Sig: Take 1 tablet (0.25 mg total) by mouth at bedtime as needed for anxiety.    Dispense:  30 tablet    Refill:  0    Time spent: 30 Minutes Time spent includes review of chart, medications, test results and follow-up plan with the patient.  This patient was seen by Leeanne Deed AGNP-C in Collaboration with Dr Lyndon Code as a part of collaborative care agreement     Lubertha Basque. Stanislaw Acton AGNP-C Internal medicine

## 2021-01-13 ENCOUNTER — Encounter: Payer: Self-pay | Admitting: Hospice and Palliative Medicine

## 2021-01-16 ENCOUNTER — Other Ambulatory Visit: Payer: Medicaid Other

## 2021-01-16 ENCOUNTER — Ambulatory Visit: Payer: Medicaid Other | Admitting: Internal Medicine

## 2021-01-23 ENCOUNTER — Telehealth: Payer: Self-pay

## 2021-01-23 NOTE — Telephone Encounter (Signed)
Completed medical records for [facility].  Mailed records to [id number] [place] [facility] [address] [place], [place] [address]

## 2021-01-26 ENCOUNTER — Encounter: Payer: Self-pay | Admitting: Internal Medicine

## 2021-01-26 ENCOUNTER — Inpatient Hospital Stay: Payer: Medicaid Other | Attending: Internal Medicine

## 2021-01-26 ENCOUNTER — Inpatient Hospital Stay: Payer: Medicaid Other

## 2021-01-26 ENCOUNTER — Inpatient Hospital Stay (HOSPITAL_BASED_OUTPATIENT_CLINIC_OR_DEPARTMENT_OTHER): Payer: Medicaid Other | Admitting: Internal Medicine

## 2021-01-26 DIAGNOSIS — F17210 Nicotine dependence, cigarettes, uncomplicated: Secondary | ICD-10-CM | POA: Diagnosis not present

## 2021-01-26 DIAGNOSIS — Z7901 Long term (current) use of anticoagulants: Secondary | ICD-10-CM | POA: Insufficient documentation

## 2021-01-26 DIAGNOSIS — R140 Abdominal distension (gaseous): Secondary | ICD-10-CM

## 2021-01-26 DIAGNOSIS — R772 Abnormality of alphafetoprotein: Secondary | ICD-10-CM | POA: Diagnosis not present

## 2021-01-26 DIAGNOSIS — Z122 Encounter for screening for malignant neoplasm of respiratory organs: Secondary | ICD-10-CM

## 2021-01-26 DIAGNOSIS — B1920 Unspecified viral hepatitis C without hepatic coma: Secondary | ICD-10-CM | POA: Insufficient documentation

## 2021-01-26 DIAGNOSIS — K760 Fatty (change of) liver, not elsewhere classified: Secondary | ICD-10-CM

## 2021-01-26 DIAGNOSIS — I2510 Atherosclerotic heart disease of native coronary artery without angina pectoris: Secondary | ICD-10-CM | POA: Insufficient documentation

## 2021-01-26 LAB — CBC WITH DIFFERENTIAL/PLATELET
Abs Immature Granulocytes: 0.04 10*3/uL (ref 0.00–0.07)
Basophils Absolute: 0.1 10*3/uL (ref 0.0–0.1)
Basophils Relative: 1 %
Eosinophils Absolute: 0.4 10*3/uL (ref 0.0–0.5)
Eosinophils Relative: 3 %
HCT: 45.6 % (ref 39.0–52.0)
Hemoglobin: 15.8 g/dL (ref 13.0–17.0)
Immature Granulocytes: 0 %
Lymphocytes Relative: 19 %
Lymphs Abs: 2.1 10*3/uL (ref 0.7–4.0)
MCH: 31.8 pg (ref 26.0–34.0)
MCHC: 34.6 g/dL (ref 30.0–36.0)
MCV: 91.8 fL (ref 80.0–100.0)
Monocytes Absolute: 1.4 10*3/uL — ABNORMAL HIGH (ref 0.1–1.0)
Monocytes Relative: 13 %
Neutro Abs: 6.9 10*3/uL (ref 1.7–7.7)
Neutrophils Relative %: 64 %
Platelets: 97 10*3/uL — ABNORMAL LOW (ref 150–400)
RBC: 4.97 MIL/uL (ref 4.22–5.81)
RDW: 13.8 % (ref 11.5–15.5)
WBC: 11 10*3/uL — ABNORMAL HIGH (ref 4.0–10.5)
nRBC: 0 % (ref 0.0–0.2)

## 2021-01-26 LAB — COMPREHENSIVE METABOLIC PANEL
ALT: 22 U/L (ref 0–44)
AST: 22 U/L (ref 15–41)
Albumin: 3.6 g/dL (ref 3.5–5.0)
Alkaline Phosphatase: 57 U/L (ref 38–126)
Anion gap: 11 (ref 5–15)
BUN: 15 mg/dL (ref 8–23)
CO2: 26 mmol/L (ref 22–32)
Calcium: 9.8 mg/dL (ref 8.9–10.3)
Chloride: 102 mmol/L (ref 98–111)
Creatinine, Ser: 1.23 mg/dL (ref 0.61–1.24)
GFR, Estimated: >60 mL/min (ref >60)
Glucose, Bld: 115 mg/dL — ABNORMAL HIGH (ref 70–99)
Potassium: 4.3 mmol/L (ref 3.5–5.1)
Sodium: 139 mmol/L (ref 135–145)
Total Bilirubin: 0.9 mg/dL (ref 0.3–1.2)
Total Protein: 7.7 g/dL (ref 6.5–8.1)

## 2021-01-26 LAB — FERRITIN: Ferritin: 18 ng/mL — ABNORMAL LOW (ref 24–336)

## 2021-01-26 LAB — IRON AND TIBC
Iron: 86 ug/dL (ref 45–182)
Saturation Ratios: 17 % — ABNORMAL LOW (ref 17.9–39.5)
TIBC: 517 ug/dL — ABNORMAL HIGH (ref 250–450)
UIBC: 431 ug/dL

## 2021-01-26 NOTE — Assessment & Plan Note (Addendum)
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   #Ferritin 17 saturation 17%.  Hold phlebotomy.  No evidence of other organ dysfunction.  # Elevated AFP-question secondary cirrhosis; SEP 2021-MRI liver negative for any lesions.   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 Defer to Gi [Dr.Wohl].  Given the concerns of abdominal distention-check ultrasound liver.   # Active smoker- Recommend quitting smoking.  Discussed regarding lung cancer screening program at length; which includes low-dose CT scan on annual basis for 5 years.  Based upon the findings patient would be recommended surveillance/biopsies/or surgery. Pt is interested.  We will make a referral to [facility].   # DISPOSITION: will call Dedra Skeens- [phone]/ friend # NO phlebotomy today # [place] abodmen1 week # refer to Lung caner screening program- # follow up TBD-Dr.B  Cc; Glenna Fellows  - Dr.B

## 2021-01-26 NOTE — Progress Notes (Signed)
Pts family friend and pt would like a clearer understanding on what all is going on with pt. Dx and what is going on with his Hep C etc. Distention in abdomen.

## 2021-01-26 NOTE — Progress Notes (Signed)
HCT 45.6 today. No phlebotomy required.

## 2021-01-26 NOTE — Progress Notes (Signed)
[facility] CONSULT NOTE  Patient Care Team: Lyndon Code, MD as PCP - General (Internal Medicine)  CHIEF COMPLAINTS/PURPOSE OF CONSULTATION: Hemochromatosis.   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 Two copies of H63D were identified.  C282Y and S65C were negative;Elevated Ferritin/I sat-88%;  SEP 2021- s/p Phlebotomy x3  # Elevated AFP- 73 [SEP 2021]  # Cirrhosis/ HEP C [started end of sep,2021 x 12 weeks; Dr.Wohl]  # CAD- on xarelto.   HISTORY OF PRESENTING ILLNESS:  Gabriel Hoffman 65 y.o.  male history of hepatitis C/cirrhosis splenomegaly and history of hereditary hemochromatosis/elevated iron saturation/ferritin is here for follow-up.  Patient admits to mild abdominal distention.  Denies any abdominal pain.  Denies any swelling of the legs.  No nausea no vomiting.   Review of Systems  Constitutional: Positive for malaise/fatigue. Negative for chills, diaphoresis, fever and weight loss.  HENT: Negative for nosebleeds and sore throat.   Eyes: Negative for double vision.  Respiratory: Negative for cough, hemoptysis, sputum production, shortness of breath and wheezing.   Cardiovascular: Negative for chest pain, palpitations, orthopnea and leg swelling.  Gastrointestinal: Negative for abdominal pain, blood in stool, constipation, diarrhea, heartburn, melena, nausea and vomiting.  Genitourinary: Negative for dysuria, frequency and urgency.  Musculoskeletal: Positive for back pain, joint pain and myalgias.  Skin: Negative.  Negative for itching and rash.  Neurological: Negative for dizziness, tingling, focal weakness, weakness and headaches.  Endo/Heme/Allergies: Does not bruise/bleed easily.  Psychiatric/Behavioral: Negative for depression. The patient is not nervous/anxious and does not have insomnia.     MEDICAL HISTORY:  Past  Medical History:  Diagnosis Date  . Cirrhosis (HCC) 06/15/2020  . Hepatitis C 06/15/2020  . Hypertension   . Thyroid disease     SURGICAL HISTORY: Past Surgical History:  Procedure Laterality Date  . CARDIAC CATHETERIZATION    . CATARACT EXTRACTION    . LEG SURGERY Right    rod in leg  . WRIST SURGERY Left     SOCIAL HISTORY: Social History   Socioeconomic History  . Marital status: Single    Spouse name: Not on file  . Number of children: Not on file  . Years of education: Not on file  . Highest education level: Not on file  Occupational History  . Not on file  Tobacco Use  . Smoking status: Current Every Day Smoker    Packs/day: 1.00    Types: Cigarettes  . Smokeless tobacco: Never Used  . Tobacco comment: pack last about 4 days now 01-12-2021  Vaping Use  . Vaping Use: Never used  Substance and Sexual Activity  . Alcohol use: Never  . Drug use: Never  . Sexual activity: Not on file  Other Topics Concern  . Not on file  Social History Narrative   Alcohol/beer once/twice a month; 1/2 ppd; used to work in [employer]. In [place]; with sister.    Social Determinants of Health   Financial Resource Strain: Not on file  Food Insecurity: Not on file  Transportation Needs: Not on file  Physical Activity: Not on file  Stress: Not on file  Social Connections: Not on file  Intimate Partner Violence: Not on file    FAMILY HISTORY: Family History  Problem Relation Age of Onset  . Cancer Mother   . COPD Mother   . Cancer Sister     ALLERGIES:  has No Known  Allergies.  MEDICATIONS:  Current Outpatient Medications  Medication Sig Dispense Refill  . ALPRAZolam (XANAX) 0.25 MG tablet Take 1 tablet (0.25 mg total) by mouth at bedtime as needed for anxiety. 30 tablet 0  . atorvastatin (LIPITOR) 10 MG tablet Take 1 tablet (10 mg total) by mouth daily. 30 tablet 5  . hydrochlorothiazide (HYDRODIURIL) 25 MG tablet Take 1 tablet (25 mg total) by mouth daily. 90  tablet 0  . levothyroxine (SYNTHROID) 112 MCG tablet Take 1 tablet (112 mcg total) by mouth daily before breakfast. 30 tablet 3  . metoprolol succinate (TOPROL-XL) 100 MG 24 hr tablet Take 1 tablet (100 mg total) by mouth daily. Take with or immediately following a meal. 90 tablet 0  . pantoprazole (PROTONIX) 20 MG tablet Take 1 tablet (20 mg total) by mouth daily. 90 tablet 1  . rivaroxaban (XARELTO) 20 MG TABS tablet Take 1 tablet (20 mg total) by mouth daily with supper. 90 tablet 1  . traZODone (DESYREL) 50 MG tablet Take 0.5-1 tablets (25-50 mg total) by mouth at bedtime as needed for sleep. 30 tablet 3   No current facility-administered medications for this visit.      PHYSICAL EXAMINATION:   Vitals:   01/26/21 1016  BP: (!) 164/95  Pulse: 68  Resp: 16  Temp: 97.7 F (36.5 C)  SpO2: 97%   Filed Weights   01/26/21 1016  Weight: 188 lb (85.3 kg)    Physical Exam Constitutional:      Comments: Accompanied by family friend.   HENT:     Head: Normocephalic and atraumatic.     Mouth/Throat:     Pharynx: No oropharyngeal exudate.  Eyes:     Pupils: Pupils are equal, round, and reactive to light.  Cardiovascular:     Rate and Rhythm: Normal rate and regular rhythm.  Pulmonary:     Effort: No respiratory distress.     Breath sounds: No wheezing.     Comments: Decreased air entry bilaterally.  Abdominal:     General: Bowel sounds are normal. There is no distension.     Palpations: Abdomen is soft. There is no mass.     Tenderness: There is no abdominal tenderness. There is no guarding or rebound.  Musculoskeletal:        General: No tenderness. Normal range of motion.     Cervical back: Normal range of motion and neck supple.  Skin:    General: Skin is warm.  Neurological:     Mental Status: He is alert and oriented to person, place, and time.  Psychiatric:        Mood and Affect: Affect normal.     LABORATORY DATA:  I have reviewed the data as listed Lab  Results  Component Value Date   WBC 11.0 (H) 01/26/2021   HGB 15.8 01/26/2021   HCT 45.6 01/26/2021   MCV 91.8 01/26/2021   PLT 97 (L) 01/26/2021   Recent Labs    05/13/20 0833 06/26/20 1609 07/25/20 1216 09/19/20 1028 01/26/21 0958  NA 138  --  138 136 139  K 4.1  --  3.8 3.5 4.3  CL 100  --  99 102 102  CO2 27  --  29 25 26   GLUCOSE 94  --  115* 109* 115*  BUN 16  --  17 18 15   CREATININE 0.95  --  0.91 0.87 1.23  CALCIUM 9.1  --  9.5 9.3 9.8  GFRNONAA 85  --  >60 >60 >  60  GFRAA 99  --  >60  --   --   PROT 7.1 7.3 7.8 7.6 7.7  ALBUMIN 3.6* 3.3* 3.2* 3.4* 3.6  AST 116* 124* 41 28 22  ALT 130* 116* 44 22 22  ALKPHOS 127* 150* 97 72 57  BILITOT 1.4* 1.6* 1.9* 1.1 0.9  BILIDIR  --  0.84*  --   --   --      No results found.  Hemochromatosis, hereditary (HCC) # Hereditary hemochromatosis-[Elevated iron saturation-iron studies; unclear if patient has organ involvement-given the hepatitis C/cirrhosis]- s/p phelbtomy x3.   #Ferritin 17 saturation 17%.  Hold phlebotomy.  No evidence of other organ dysfunction.  # Elevated AFP-question secondary cirrhosis; SEP 2021-MRI liver negative for any lesions.   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 Defer to Gi [Dr.Wohl].  Given the concerns of abdominal distention-check ultrasound liver.   # Active smoker- Recommend quitting smoking.  Discussed regarding lung cancer screening program at length; which includes low-dose CT scan on annual basis for 5 years.  Based upon the findings patient would be recommended surveillance/biopsies/or surgery. Pt is interested.  We will make a referral to [facility].   # DISPOSITION: will call Dedra Skeens- [phone]/ friend # NO phlebotomy today # [place] abodmen1 week # refer to Lung caner screening program- # follow up TBD-Dr.B  Cc; Glenna Fellows  - Dr.B   All questions were answered. The patient knows to call the clinic with any problems, questions or concerns.   Earna Coder,  MD 01/26/2021 8:33 PM

## 2021-01-27 ENCOUNTER — Other Ambulatory Visit: Payer: Self-pay

## 2021-01-27 DIAGNOSIS — I10 Essential (primary) hypertension: Secondary | ICD-10-CM

## 2021-01-27 LAB — AFP TUMOR MARKER: AFP, Serum, Tumor Marker: 4.8 ng/mL (ref 0.0–8.4)

## 2021-01-27 MED ORDER — METOPROLOL SUCCINATE ER 100 MG PO TB24: 100.0000 mg | ORAL_TABLET | Freq: Every day | ORAL | 0 refills | Status: DC

## 2021-01-27 MED ORDER — HYDROCHLOROTHIAZIDE 25 MG PO TABS: 25.0000 mg | ORAL_TABLET | Freq: Every day | ORAL | 0 refills | Status: DC

## 2021-02-04 ENCOUNTER — Ambulatory Visit
Admission: RE | Admit: 2021-02-04 | Discharge: 2021-02-04 | Disposition: A | Discharge status: Home / Self Care | Payer: Medicaid Other | Source: Ambulatory Visit | Attending: Internal Medicine | Admitting: Internal Medicine

## 2021-02-04 ENCOUNTER — Other Ambulatory Visit: Payer: Self-pay

## 2021-02-04 ENCOUNTER — Encounter: Payer: Self-pay | Admitting: *Deleted

## 2021-02-04 ENCOUNTER — Telehealth: Payer: Self-pay | Admitting: *Deleted

## 2021-02-04 DIAGNOSIS — R140 Abdominal distension (gaseous): Secondary | ICD-10-CM | POA: Insufficient documentation

## 2021-02-04 DIAGNOSIS — Z122 Encounter for screening for malignant neoplasm of respiratory organs: Secondary | ICD-10-CM

## 2021-02-04 DIAGNOSIS — Z87891 Personal history of nicotine dependence: Secondary | ICD-10-CM

## 2021-02-04 DIAGNOSIS — F17200 Nicotine dependence, unspecified, uncomplicated: Secondary | ICD-10-CM

## 2021-02-04 IMAGING — US US ABDOMEN LIMITED RUQ/ASCITES
1 series · 8 of 8 positions shown · non-contrast
Comparison: Abdominal MRI [DATE] reviewed.

CLINICAL DATA: Cirrhosis.  Abdominal distension.

EXAM:
LIMITED ABDOMEN ULTRASOUND FOR ASCITES
TECHNIQUE: Limited ultrasound survey for ascites was performed in all four
abdominal quadrants.

[Series 1: us abdomen limited · 8 of 8 slices shown]
[im 1/8]
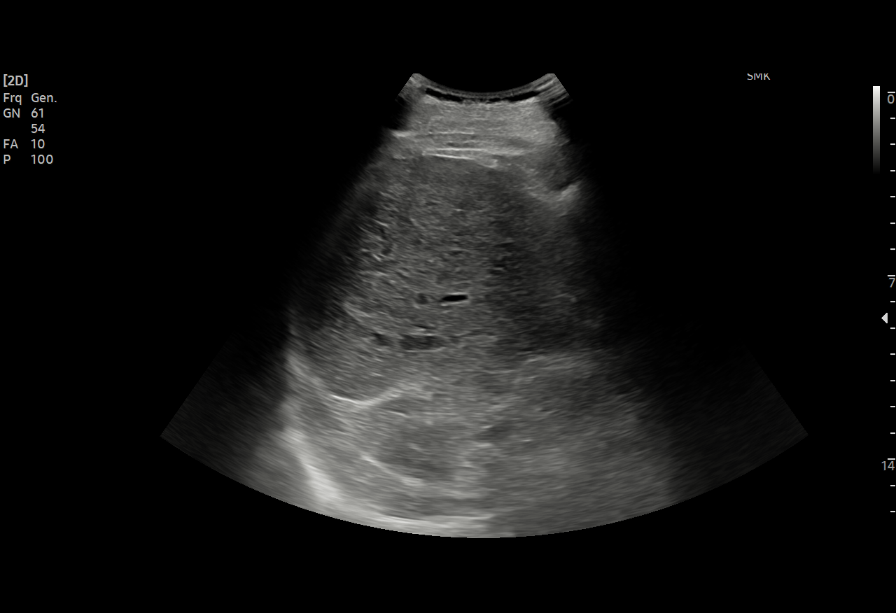
[im 2/8]
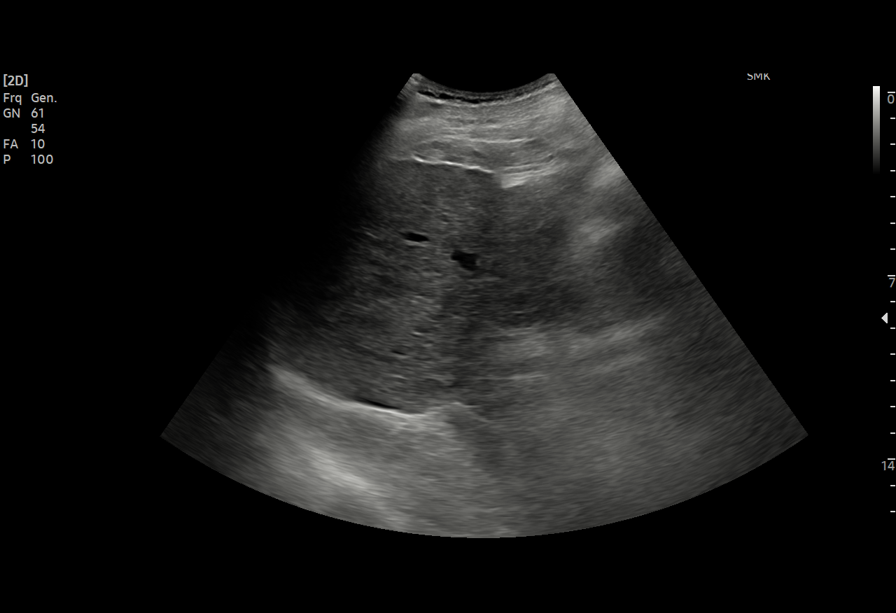
[im 3/8]
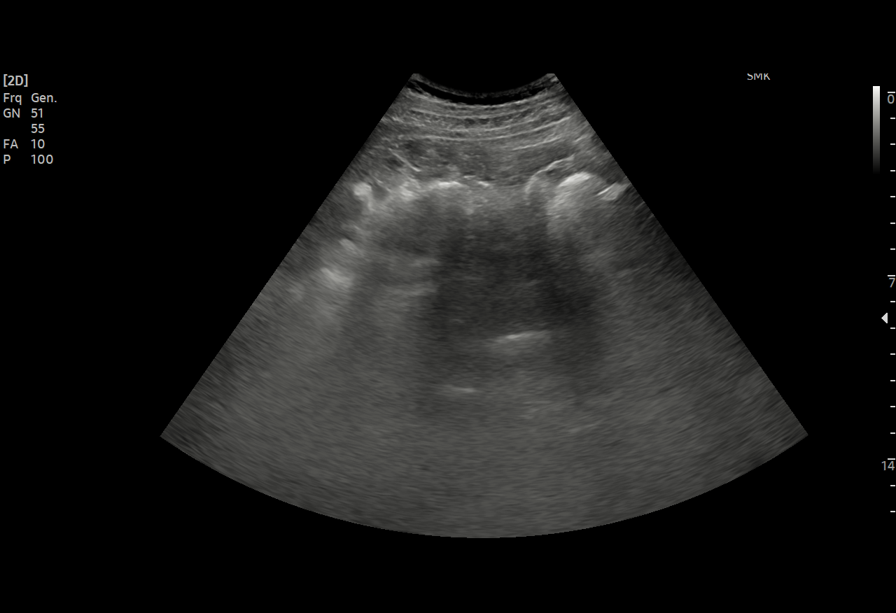
[im 4/8]
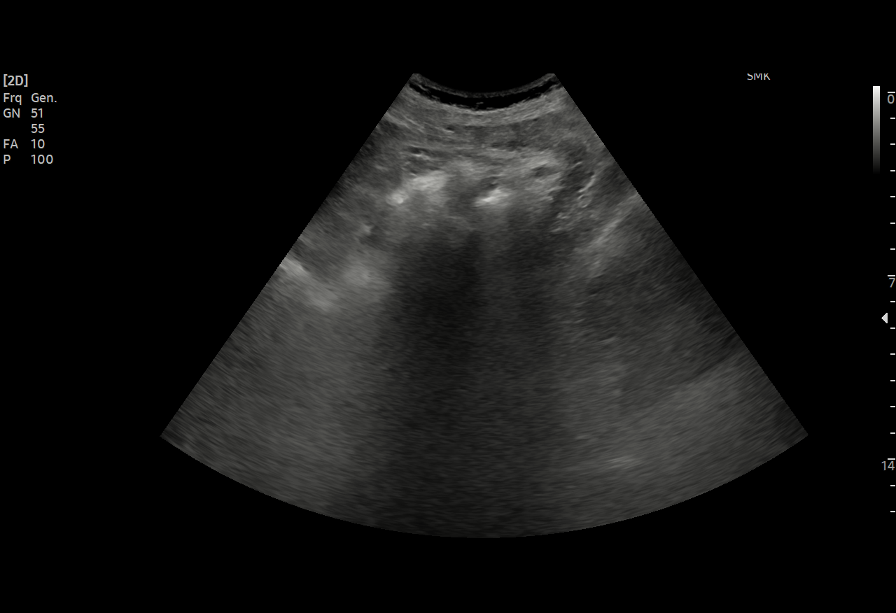
[im 5/8]
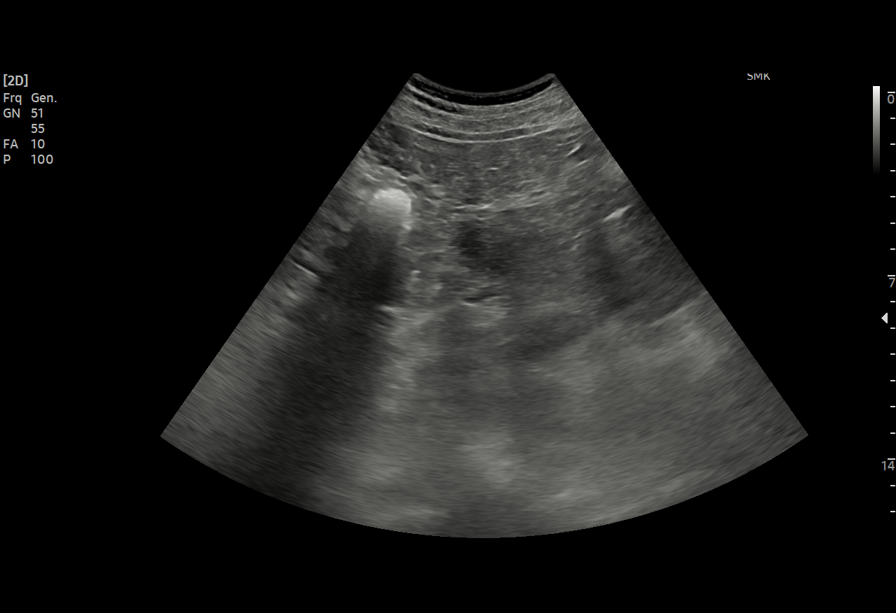
[im 6/8]
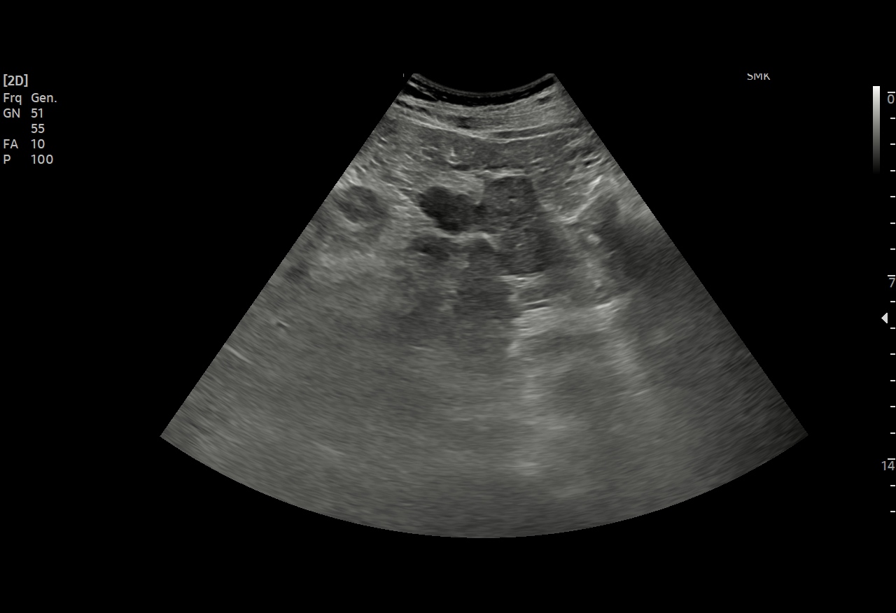
[im 7/8]
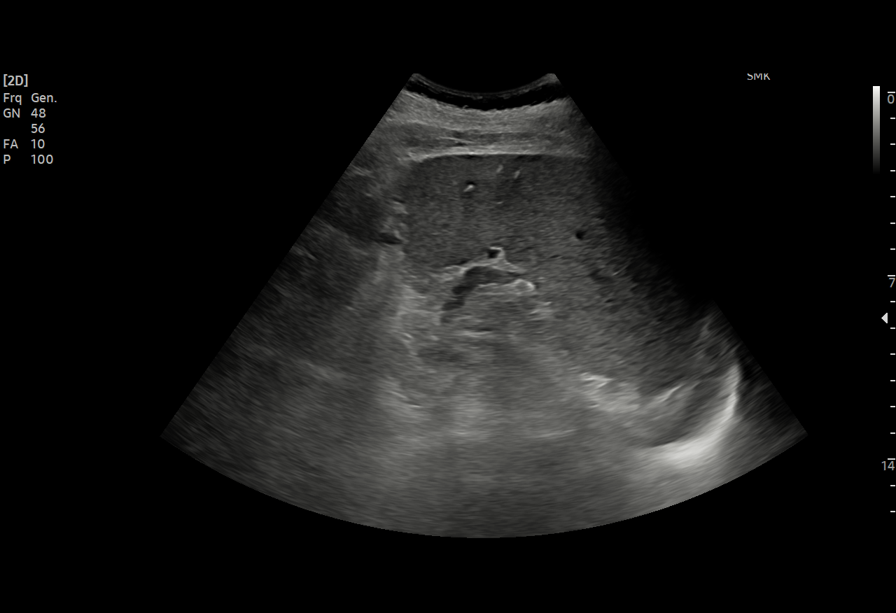
[im 8/8]
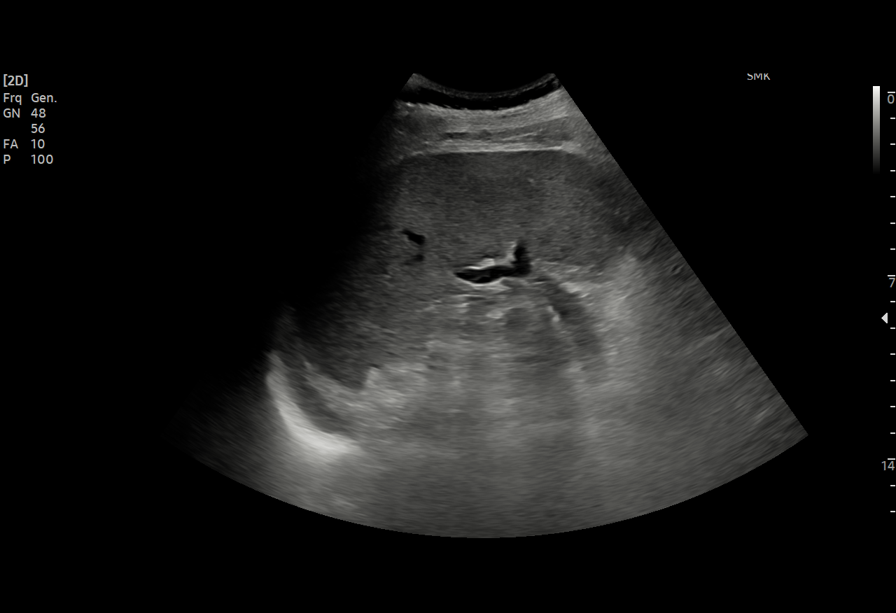

[8 of 8 positions shown; findings below may reference images not displayed]

FINDINGS: No ascites or free fluid visualized in all 4 quadrants of the
abdomen.
IMPRESSION: No sonographic evidence of ascites.

## 2021-02-04 NOTE — Telephone Encounter (Signed)
Received referral for initial lung cancer screening scan. Contacted patient and obtained smoking history,(current everyday smoker, 1 ppd x 50 yrs ) as well as answering questions related to screening process. Patient denies signs of lung cancer such as weight loss or hemoptysis. Patient denies comorbidity that would prevent curative treatment if lung cancer were found. Patient is scheduled for shared decision making visit and CT scan on 02/23/21 @ 1:45 pm.

## 2021-02-05 ENCOUNTER — Telehealth: Payer: Self-pay | Admitting: *Deleted

## 2021-02-05 NOTE — Telephone Encounter (Signed)
Patient called and LVM. Patient verbalizes he was seen at cancer center last week and had blood drawn. They want him to f.u with Dr.Wohl to see if the Hep C has cleared up.

## 2021-02-10 ENCOUNTER — Other Ambulatory Visit: Payer: Self-pay

## 2021-02-10 DIAGNOSIS — Z8619 Personal history of other infectious and parasitic diseases: Secondary | ICD-10-CM

## 2021-02-10 NOTE — Telephone Encounter (Signed)
Contacted pt and advised to stop by the office to recheck his viral load. Pt will come tomorrow.

## 2021-02-11 ENCOUNTER — Ambulatory Visit: Payer: Medicaid Other | Admitting: Hospice and Palliative Medicine

## 2021-02-11 ENCOUNTER — Encounter: Payer: Self-pay | Admitting: Gastroenterology

## 2021-02-12 ENCOUNTER — Other Ambulatory Visit: Payer: Self-pay

## 2021-02-12 ENCOUNTER — Encounter: Payer: Self-pay | Admitting: Hospice and Palliative Medicine

## 2021-02-12 ENCOUNTER — Ambulatory Visit: Payer: Medicaid Other | Admitting: Hospice and Palliative Medicine

## 2021-02-12 VITALS — BP 132/84 | HR 66 | Temp 98.3°F | Resp 16 | Ht 65.0 in | Wt 183.0 lb

## 2021-02-12 DIAGNOSIS — Z0001 Encounter for general adult medical examination with abnormal findings: Secondary | ICD-10-CM

## 2021-02-12 DIAGNOSIS — R5383 Other fatigue: Secondary | ICD-10-CM

## 2021-02-12 DIAGNOSIS — F411 Generalized anxiety disorder: Secondary | ICD-10-CM

## 2021-02-12 DIAGNOSIS — E010 Iodine-deficiency related diffuse (endemic) goiter: Secondary | ICD-10-CM | POA: Diagnosis not present

## 2021-02-12 DIAGNOSIS — K739 Chronic hepatitis, unspecified: Secondary | ICD-10-CM | POA: Diagnosis not present

## 2021-02-12 DIAGNOSIS — M25561 Pain in right knee: Secondary | ICD-10-CM

## 2021-02-12 DIAGNOSIS — G8929 Other chronic pain: Secondary | ICD-10-CM

## 2021-02-12 LAB — HCV RNA QUANT: Hepatitis C Quantitation: NOT DETECTED IU/mL

## 2021-02-12 MED ORDER — ALPRAZOLAM 0.25 MG PO TABS: 0.2500 mg | ORAL_TABLET | Freq: Every evening | ORAL | 0 refills | Status: DC | PRN

## 2021-02-12 NOTE — Progress Notes (Signed)
[facility] [place], [place] [address]  Internal MEDICINE  Office Visit Note  Patient Name: Gabriel Hoffman  [id number]  [id number]  Date of Service: 02/19/2021  Chief Complaint  Patient presents with  . Annual Exam     HPI Pt is here for routine health maintenance examination Chronic right knee pain brought images of rod and screws that were surgically placed a few years ago, complaining that his pain is becoming more intense and unbearable, was told by ortho he would eventually need total knee replacement Followed by GI for liver disease-at this time hepatitis viral load undetectable Followed by hematology for hemochromatosis--stable at this time, history of phlebotomy in the past  Scheduled later this month for abdominal US for evaluation of possible ascites Also scheduled to be set up for low dose chest CT for lung cancer screening due to current cigarette smoking  Feels that his sleep has been improved since starting on trazodone, continues to occasionally use alprazolam at night on restlessness that he continues to struggle with insomnia  Brought along disability paperwork today--will need to review chart extensively and may require specialist evaluation to properly fill out paperwork  Current Medication: Outpatient Encounter Medications as of 02/12/2021  Medication Sig  . atorvastatin (LIPITOR) 10 MG tablet Take 1 tablet (10 mg total) by mouth daily.  . hydrochlorothiazide (HYDRODIURIL) 25 MG tablet Take 1 tablet (25 mg total) by mouth daily.  . metoprolol succinate (TOPROL-XL) 100 MG 24 hr tablet Take 1 tablet (100 mg total) by mouth daily. Take with or immediately following a meal.  . pantoprazole (PROTONIX) 20 MG tablet Take 1 tablet (20 mg total) by mouth daily.  . rivaroxaban (XARELTO) 20 MG TABS tablet Take 1 tablet (20 mg total) by mouth daily with supper.  . traZODone (DESYREL) 50 MG tablet Take 0.5-1 tablets (25-50 mg total) by mouth at  bedtime as needed for sleep.  . [DISCONTINUED] ALPRAZolam (XANAX) 0.25 MG tablet Take 1 tablet (0.25 mg total) by mouth at bedtime as needed for anxiety.  . [DISCONTINUED] levothyroxine (SYNTHROID) 112 MCG tablet Take 1 tablet (112 mcg total) by mouth daily before breakfast.  . ALPRAZolam (XANAX) 0.25 MG tablet Take 1 tablet (0.25 mg total) by mouth at bedtime as needed for anxiety.   No facility-administered encounter medications on file as of 02/12/2021.    Surgical History: Past Surgical History:  Procedure Laterality Date  . CARDIAC CATHETERIZATION    . CATARACT EXTRACTION    . LEG SURGERY Right    rod in leg  . WRIST SURGERY Left     Medical History: Past Medical History:  Diagnosis Date  . Cirrhosis ([place]) 06/15/2020  . Hepatitis C 06/15/2020  . Hypertension   . Thyroid disease     Family History: Family History  Problem Relation Age of Onset  . Cancer Mother   . COPD Mother   . Cancer Sister     Review of Systems  Constitutional: Negative for chills, fatigue and unexpected weight change.  HENT: Negative for congestion, postnasal drip, rhinorrhea, sneezing and sore throat.   Eyes: Negative for redness.  Respiratory: Negative for cough, chest tightness and shortness of breath.   Cardiovascular: Negative for chest pain and palpitations.  Gastrointestinal: Negative for abdominal pain, constipation, diarrhea, nausea and vomiting.  Genitourinary: Negative for dysuria and frequency.  Musculoskeletal: Negative for arthralgias, back pain, joint swelling and neck pain.       Chronic right knee pain  Skin: Negative for  rash.  Neurological: Negative for tremors and numbness.  Hematological: Negative for adenopathy. Does not bruise/bleed easily.  Psychiatric/Behavioral: Positive for sleep disturbance. Negative for behavioral problems (Depression) and suicidal ideas. The patient is nervous/anxious.      Vital Signs: BP 132/84   Pulse 66   Temp 98.3 F (36.8 C)   Resp  16   Ht 5' 5" (1.651 m)   Wt 183 lb (83 kg)   SpO2 97%   BMI 30.45 kg/m    Physical Exam Vitals reviewed.  Constitutional:      Appearance: Normal appearance. He is normal weight.  Neck:     Thyroid: Thyromegaly present.  Cardiovascular:     Rate and Rhythm: Normal rate and regular rhythm.     Pulses: Normal pulses.     Heart sounds: Normal heart sounds.  Pulmonary:     [place]: Pulmonary effort is normal.     Breath sounds: Normal breath sounds.  Abdominal:     General: There is distension.     Palpations: Abdomen is soft.  Musculoskeletal:        General: Normal range of motion.     Cervical back: Normal range of motion.  Skin:    General: Skin is warm.  Neurological:     General: No focal deficit present.     Mental Status: He is alert and oriented to person, place, and time. Mental status is at baseline.  Psychiatric:        Mood and Affect: Mood normal.        Behavior: Behavior normal.        Thought Content: Thought content normal.        Judgment: Judgment normal.      LABS: Recent Results (from the past 2160 hour(s))  POCT Urine Drug Screen     Status: None   Collection Time: 12/22/20  8:28 AM  Result Value Ref Range   POC Methamphetamine UR None Detected None Detected   POC Opiate Ur None Detected None Detected   POC Barbiturate UR None Detected None Detected   POC Amphetamine UR None Detected None Detected   POC Oxycodone UR None Detected None Detected   POC Cocaine UR None Detected None Detected   POC Ecstasy UR None Detected None Detected   POC TRICYCLICS UR None Detected None Detected   POC PHENCYCLIDINE UR None Detected None Detected   POC Marijuana UR None Detected None Detected   POC Methadone UR None Detected None Detected   POC BENZODIAZEPINES UR None Detected None Detected   URINE TEMPERATURE     POC DRUG SCREEN OXIDANTS URINE     POC SPECIFIC GRAVITY URINE     POC PH URINE     Methylenedioxyamphetamine None Detected None Detected  TSH  + free T4     Status: Abnormal   Collection Time: 12/22/20  3:44 PM  Result Value Ref Range   TSH 7.290 (H) 0.450 - 4.500 uIU/mL   Free T4 1.40 0.82 - 1.77 ng/dL  AFP tumor marker     Status: None   Collection Time: 01/26/21  9:58 AM  Result Value Ref Range   AFP, Serum, Tumor Marker 4.8 0.0 - 8.4 ng/mL    Comment: (NOTE) Roche Diagnostics Electrochemiluminescence Immunoassay (ECLIA)                          **Please note reference interval change** Values obtained with different assay methods or kits cannot  be used interchangeably.  Results cannot be interpreted as absolute evidence of the presence or absence of malignant disease. This test is not interpretable in pregnant females. Performed At: [facility], [place] [id number] Rush Farmer MD QQ:[id number]   Iron and TIBC     Status: Abnormal   Collection Time: 01/26/21  9:58 AM  Result Value Ref Range   Iron 86 45 - 182 ug/dL   TIBC 517 (H) 250 - 450 ug/dL   Saturation Ratios 17 (L) 17.9 - 39.5 %   UIBC 431 ug/dL    Comment: Performed at [facility], [address] [place], [place] [address]  Ferritin     Status: Abnormal   Collection Time: 01/26/21  9:58 AM  Result Value Ref Range   Ferritin 18 (L) 24 - 336 ng/mL    Comment: Performed at [facility], [address] [place], [place] [address]  Comprehensive metabolic panel     Status: Abnormal   Collection Time: 01/26/21  9:58 AM  Result Value Ref Range   Sodium 139 135 - 145 mmol/L   Potassium 4.3 3.5 - 5.1 mmol/L   Chloride 102 98 - 111 mmol/L   CO2 26 22 - 32 mmol/L   Glucose, Bld 115 (H) 70 - 99 mg/dL    Comment: Glucose reference range applies only to samples taken after fasting for at least 8 hours.   BUN 15 8 - 23 mg/dL   Creatinine, Ser 1.23 0.61 - 1.24 mg/dL   Calcium 9.8 8.9 - 10.3 mg/dL   Total Protein 7.7 6.5 - 8.1 g/dL   Albumin 3.6 3.5 - 5.0 g/dL   AST 22 15 - 41 U/L   ALT 22 0 - 44 U/L    Alkaline Phosphatase 57 38 - 126 U/L   Total Bilirubin 0.9 0.3 - 1.2 mg/dL   GFR, Estimated >60 >60 mL/min    Comment: (NOTE) Calculated using the CKD-EPI Creatinine Equation (2021)    Anion gap 11 5 - 15    Comment: Performed at [facility], [address] [place], [place] [address]  CBC with Differential     Status: Abnormal   Collection Time: 01/26/21  9:58 AM  Result Value Ref Range   WBC 11.0 (H) 4.0 - 10.5 K/uL   RBC 4.97 4.22 - 5.81 MIL/uL   Hemoglobin 15.8 13.0 - 17.0 g/dL   HCT 45.6 39.0 - 52.0 %   MCV 91.8 80.0 - 100.0 fL   MCH 31.8 26.0 - 34.0 pg   MCHC 34.6 30.0 - 36.0 g/dL   RDW 13.8 11.5 - 15.5 %   Platelets 97 (L) 150 - 400 K/uL   nRBC 0.0 0.0 - 0.2 %   Neutrophils Relative % 64 %   Neutro Abs 6.9 1.7 - 7.7 K/uL   Lymphocytes Relative 19 %   Lymphs Abs 2.1 0.7 - 4.0 K/uL   Monocytes Relative 13 %   Monocytes Absolute 1.4 (H) 0.1 - 1.0 K/uL   Eosinophils Relative 3 %   Eosinophils Absolute 0.4 0.0 - 0.5 K/uL   Basophils Relative 1 %   Basophils Absolute 0.1 0.0 - 0.1 K/uL   Immature Granulocytes 0 %   Abs Immature Granulocytes 0.04 0.00 - 0.07 K/uL    Comment: Performed at [facility], [address] [place], [place] [address]  HCV RNA quant     Status: None   Collection Time: 02/11/21  8:45 AM  Result Value  Ref Range   Hepatitis C Quantitation HCV Not Detected IU/mL   Test Information Comment     Comment: The quantitative range of this assay is 15 IU/mL to 100 million IU/mL.  TSH + free T4     Status: Abnormal   Collection Time: 02/17/21 11:36 AM  Result Value Ref Range   TSH 8.680 (H) 0.450 - 4.500 uIU/mL   Free T4 1.15 0.82 - 1.77 ng/dL  CBC w/Diff/Platelet     Status: Abnormal   Collection Time: 02/17/21 11:36 AM  Result Value Ref Range   WBC 9.1 3.4 - 10.8 x10E3/uL   RBC 4.98 4.14 - 5.80 x10E6/uL   Hemoglobin 16.0 13.0 - 17.7 g/dL   Hematocrit 45.3 37.5 - 51.0 %   MCV 91 79 - 97 fL   MCH 32.1 26.6 - 33.0 pg   MCHC 35.3 31.5 -  35.7 g/dL   RDW 12.9 11.6 - 15.4 %   Platelets 106 (L) 150 - 450 x10E3/uL    Comment: Actual platelet count may be somewhat higher than reported due to aggregation of platelets in this sample.    Neutrophils 57 Not Estab. %   Lymphs 26 Not Estab. %   Monocytes 12 Not Estab. %   Eos 4 Not Estab. %   Basos 1 Not Estab. %   Neutrophils Absolute 5.2 1.4 - 7.0 x10E3/uL   Lymphocytes Absolute 2.3 0.7 - 3.1 x10E3/uL   Monocytes Absolute 1.1 (H) 0.1 - 0.9 x10E3/uL   EOS (ABSOLUTE) 0.4 0.0 - 0.4 x10E3/uL   Basophils Absolute 0.1 0.0 - 0.2 x10E3/uL   Immature Granulocytes 0 Not Estab. %   Immature Grans (Abs) 0.0 0.0 - 0.1 x10E3/uL   Hematology Comments: Note:     Comment: Verified by microscopic examination.  Comprehensive Metabolic Panel (CMET)     Status: Abnormal   Collection Time: 02/17/21 11:36 AM  Result Value Ref Range   Glucose 75 65 - 99 mg/dL   BUN 17 8 - 27 mg/dL   Creatinine, Ser 1.08 0.76 - 1.27 mg/dL   eGFR 78 >59 mL/min/1.73   BUN/Creatinine Ratio 16 10 - 24   Sodium 141 134 - 144 mmol/L   Potassium 4.4 3.5 - 5.2 mmol/L   Chloride 101 96 - 106 mmol/L   CO2 26 20 - 29 mmol/L   Calcium 9.6 8.6 - 10.2 mg/dL   Total Protein 7.8 6.0 - 8.5 g/dL   Albumin 4.1 3.8 - 4.8 g/dL   Globulin, Total 3.7 1.5 - 4.5 g/dL   Albumin/Globulin Ratio 1.1 (L) 1.2 - 2.2   Bilirubin Total 0.8 0.0 - 1.2 mg/dL   Alkaline Phosphatase 76 44 - 121 IU/L   AST 29 0 - 40 IU/L   ALT 23 0 - 44 IU/L  Lipid Panel With LDL/HDL Ratio     Status: Abnormal   Collection Time: 02/17/21 11:36 AM  Result Value Ref Range   Cholesterol, Total 117 100 - 199 mg/dL   Triglycerides 116 0 - 149 mg/dL   HDL 28 (L) >39 mg/dL   VLDL Cholesterol Cal 21 5 - 40 mg/dL   LDL Chol Calc (NIH) 68 0 - 99 mg/dL   LDL/HDL Ratio 2.4 0.0 - 3.6 ratio    Comment:                                     LDL/HDL Ratio  Men  Women                               1/2 Avg.Risk  1.0    1.5                                    Avg.Risk  3.6    3.2                                2X Avg.Risk  6.2    5.0                                3X Avg.Risk  8.0    6.1   B12     Status: None   Collection Time: 02/17/21 11:36 AM  Result Value Ref Range   Vitamin B-12 535 232 - 1,245 pg/mL  Vitamin D (25 hydroxy)     Status: None   Collection Time: 02/17/21 11:36 AM  Result Value Ref Range   Vit D, 25-Hydroxy 35.0 30.0 - 100.0 ng/mL    Comment: Vitamin D deficiency has been defined by the [place] practice guideline as a level of serum 25-OH vitamin D less than 20 ng/mL (1,2). The Endocrine Society went on to further define vitamin D insufficiency as a level between 21 and 29 ng/mL (2). 1. IOM (Institute of Medicine). 2010. Dietary reference    intakes for calcium and D. [place]: The    [employer]. 2. Holick MF, Binkley [place], Bischoff-Ferrari HA, et al.    Evaluation, treatment, and prevention of vitamin D    deficiency: an Endocrine Society clinical practice    guideline. JCEM. 2011 Jul; 96(7):1911-30.    Assessment/Plan: 1. Encounter for routine adult health examination with abnormal findings Well appearing 65 year old male Up to date on PHM Will review updated routine labs and adjust plan of care accordingly  2. Thyromegaly Review [place] due to enlargement on palpation - US THYROID; Future - TSH + free T4  3. Chronic hepatitis ([place]) Undetectable at this time, followed by GI Scheduled for abdominal US for possible ascites  4. Chronic pain of right knee Referral placed to ortho for possible surgical intervention due to increased pain - Ambulatory referral to Orthopedic Surgery  5. 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 GAD (generalized anxiety disorder) Advised to wean down use--BZO to be used as needed, script will not be refilled on monthly basis [place] Controlled Substance Database was  reviewed by me for overdose risk score (ORS) Reviewed risks and possible side effects associated with taking opiates, benzodiazepines and other CNS depressants. Combination of these could cause dizziness and drowsiness. Advised patient not to drive or operate machinery when taking these medications, as patient's and other's life can be at risk and will have consequences. Patient verbalized understanding in this matter. Dependence and abuse for these drugs will be monitored closely. A Controlled substance policy and procedure is on file which allows [facility] to order a urine drug screen test at any visit. Patient understands and agrees with the plan - ALPRAZolam (XANAX) 0.25 MG tablet; Take 1 tablet (0.25 mg total) by mouth at bedtime as  needed for anxiety.  Dispense: 30 tablet; Refill: 0  7. Other fatigue - TSH + free T4 - CBC w/Diff/Platelet - Comprehensive Metabolic Panel (CMET) - Lipid Panel With LDL/HDL Ratio - B12 - Vitamin D (25 hydroxy)  General Counseling: onyx schirmer understanding of the findings of todays visit and agrees with plan of treatment. I have discussed any further diagnostic evaluation that may be needed or ordered today. We also reviewed his medications today. he has been encouraged to call the office with any questions or concerns that should arise related to todays visit.    Counseling:    Orders Placed This Encounter  Procedures  . US THYROID  . TSH + free T4  . CBC w/Diff/Platelet  . Comprehensive Metabolic Panel (CMET)  . Lipid Panel With LDL/HDL Ratio  . B12  . Vitamin D (25 hydroxy)  . Ambulatory referral to Orthopedic Surgery    Meds ordered this encounter  Medications  . ALPRAZolam (XANAX) 0.25 MG tablet    Sig: Take 1 tablet (0.25 mg total) by mouth at bedtime as needed for anxiety.    Dispense:  30 tablet    Refill:  0    Total time spent: 30 Minutes  Time spent includes review of chart, medications, test results, and  follow up plan with the patient.   This patient was seen by Theodoro Grist AGNP-C Collaboration with Dr Lavera Guise as a part of collaborative care agreement   Tanna Furry. [facility] Internal Medicine

## 2021-02-16 ENCOUNTER — Telehealth: Payer: Self-pay

## 2021-02-16 NOTE — Telephone Encounter (Signed)
Pt notified of lab results

## 2021-02-16 NOTE — Telephone Encounter (Signed)
-----   Message from Midge Minium, MD sent at 02/12/2021  6:22 PM EDT ----- The patient know that his hepatitis C viral load was Undetectable.

## 2021-02-18 ENCOUNTER — Other Ambulatory Visit: Payer: Self-pay | Admitting: Hospice and Palliative Medicine

## 2021-02-18 DIAGNOSIS — E039 Hypothyroidism, unspecified: Secondary | ICD-10-CM

## 2021-02-18 LAB — COMPREHENSIVE METABOLIC PANEL
ALT: 23 IU/L (ref 0–44)
AST: 29 IU/L (ref 0–40)
Albumin/Globulin Ratio: 1.1 — ABNORMAL LOW (ref 1.2–2.2)
Albumin: 4.1 g/dL (ref 3.8–4.8)
Alkaline Phosphatase: 76 IU/L (ref 44–121)
BUN/Creatinine Ratio: 16 (ref 10–24)
BUN: 17 mg/dL (ref 8–27)
Bilirubin Total: 0.8 mg/dL (ref 0.0–1.2)
CO2: 26 mmol/L (ref 20–29)
Calcium: 9.6 mg/dL (ref 8.6–10.2)
Chloride: 101 mmol/L (ref 96–106)
Creatinine, Ser: 1.08 mg/dL (ref 0.76–1.27)
Globulin, Total: 3.7 g/dL (ref 1.5–4.5)
Glucose: 75 mg/dL (ref 65–99)
Potassium: 4.4 mmol/L (ref 3.5–5.2)
Sodium: 141 mmol/L (ref 134–144)
Total Protein: 7.8 g/dL (ref 6.0–8.5)
eGFR: 78 mL/min/{1.73_m2} (ref >59)

## 2021-02-18 LAB — LIPID PANEL WITH LDL/HDL RATIO
Cholesterol, Total: 117 mg/dL (ref 100–199)
HDL: 28 mg/dL — ABNORMAL LOW (ref >39)
LDL Chol Calc (NIH): 68 mg/dL (ref 0–99)
LDL/HDL Ratio: 2.4 ratio (ref 0.0–3.6)
Triglycerides: 116 mg/dL (ref 0–149)
VLDL Cholesterol Cal: 21 mg/dL (ref 5–40)

## 2021-02-18 LAB — CBC WITH DIFFERENTIAL/PLATELET
Basophils Absolute: 0.1 10*3/uL (ref 0.0–0.2)
Basos: 1 %
EOS (ABSOLUTE): 0.4 10*3/uL (ref 0.0–0.4)
Eos: 4 %
Hematocrit: 45.3 % (ref 37.5–51.0)
Hemoglobin: 16 g/dL (ref 13.0–17.7)
Immature Grans (Abs): 0 10*3/uL (ref 0.0–0.1)
Immature Granulocytes: 0 %
Lymphocytes Absolute: 2.3 10*3/uL (ref 0.7–3.1)
Lymphs: 26 %
MCH: 32.1 pg (ref 26.6–33.0)
MCHC: 35.3 g/dL (ref 31.5–35.7)
MCV: 91 fL (ref 79–97)
Monocytes Absolute: 1.1 10*3/uL — ABNORMAL HIGH (ref 0.1–0.9)
Monocytes: 12 %
Neutrophils Absolute: 5.2 10*3/uL (ref 1.4–7.0)
Neutrophils: 57 %
Platelets: 106 10*3/uL — ABNORMAL LOW (ref 150–450)
RBC: 4.98 x10E6/uL (ref 4.14–5.80)
RDW: 12.9 % (ref 11.6–15.4)
WBC: 9.1 10*3/uL (ref 3.4–10.8)

## 2021-02-18 LAB — VITAMIN B12: Vitamin B-12: 535 pg/mL (ref 232–1245)

## 2021-02-18 LAB — TSH+FREE T4
Free T4: 1.15 ng/dL (ref 0.82–1.77)
TSH: 8.68 u[IU]/mL — ABNORMAL HIGH (ref 0.450–4.500)

## 2021-02-18 LAB — VITAMIN D 25 HYDROXY (VIT D DEFICIENCY, FRACTURES): Vit D, 25-Hydroxy: 35 ng/mL (ref 30.0–100.0)

## 2021-02-18 MED ORDER — LEVOTHYROXINE SODIUM 125 MCG PO TABS: 125.0000 ug | ORAL_TABLET | Freq: Every day | ORAL | 1 refills | Status: DC

## 2021-02-18 NOTE — Progress Notes (Signed)
Please call and let him know that based on his labs I have sent in a new prescription for an increased dose of levothyroxine, 125 mcg--he needs to start taking new prescription daily. I have also ordered repeat labs--once he starts taking new dose he needs to repeat his labs in about 6 weeks. Thanks.

## 2021-02-19 ENCOUNTER — Telehealth: Payer: Self-pay

## 2021-02-19 ENCOUNTER — Encounter: Payer: Self-pay | Admitting: Hospice and Palliative Medicine

## 2021-02-19 NOTE — Telephone Encounter (Signed)
Spoke to pt, informed him of new prescription for an increased dose of levothyroxine, 125 mcg--informed him he needs to start taking new prescription daily, once he starts taking new dose he needs to repeat his labs in about 6 weeks per Ladona Ridgel. Pt aware.

## 2021-02-19 NOTE — Telephone Encounter (Signed)
-----   Message from Theotis Burrow, NP sent at 02/18/2021 12:14 PM EDT ----- Please call and let him know that based on his labs I have sent in a new prescription for an increased dose of levothyroxine, 125 mcg--he needs to start taking new prescription daily. I have also ordered repeat labs--once he starts taking new dose he needs to repeat his labs in about 6 weeks. Thanks.

## 2021-02-20 ENCOUNTER — Other Ambulatory Visit: Payer: Self-pay | Admitting: Hospice and Palliative Medicine

## 2021-02-20 ENCOUNTER — Telehealth: Payer: Self-pay

## 2021-02-20 NOTE — Telephone Encounter (Signed)
Faxed Orthopedic referral over to [facility] at [phone]. Toni Amend

## 2021-02-23 ENCOUNTER — Other Ambulatory Visit: Payer: Self-pay

## 2021-02-23 ENCOUNTER — Telehealth: Payer: Self-pay | Admitting: Internal Medicine

## 2021-02-23 ENCOUNTER — Inpatient Hospital Stay: Payer: Medicaid Other | Attending: Nurse Practitioner | Admitting: Nurse Practitioner

## 2021-02-23 ENCOUNTER — Ambulatory Visit
Admission: RE | Admit: 2021-02-23 | Discharge: 2021-02-23 | Disposition: A | Discharge status: Home / Self Care | Payer: Medicaid Other | Source: Ambulatory Visit | Attending: Nurse Practitioner | Admitting: Nurse Practitioner

## 2021-02-23 DIAGNOSIS — F17200 Nicotine dependence, unspecified, uncomplicated: Secondary | ICD-10-CM | POA: Insufficient documentation

## 2021-02-23 DIAGNOSIS — Z122 Encounter for screening for malignant neoplasm of respiratory organs: Secondary | ICD-10-CM | POA: Diagnosis present

## 2021-02-23 DIAGNOSIS — Z87891 Personal history of nicotine dependence: Secondary | ICD-10-CM

## 2021-02-23 IMAGING — CT CT CHEST LUNG CANCER SCREENING LOW DOSE W/O CM
2 of 5 series · 15 of 40 positions shown, 18 images · non-contrast
Comparison: [DATE] chest radiograph. CTA chest from [REDACTED]orial [DATE]

CLINICAL DATA: Fifty pack-year smoking history.  Current smoker.

EXAM:
CT CHEST WITHOUT CONTRAST LOW-DOSE FOR LUNG CANCER SCREENING
TECHNIQUE: Multidetector CT imaging of the chest was performed following the
standard protocol without IV contrast.

[Series 3: lung 1.00 · axial · 0.68mm/px · z∈[-1182,-890]mm · 12 of 324 slices shown, 15 images]
[im 16/324  mediastinal]
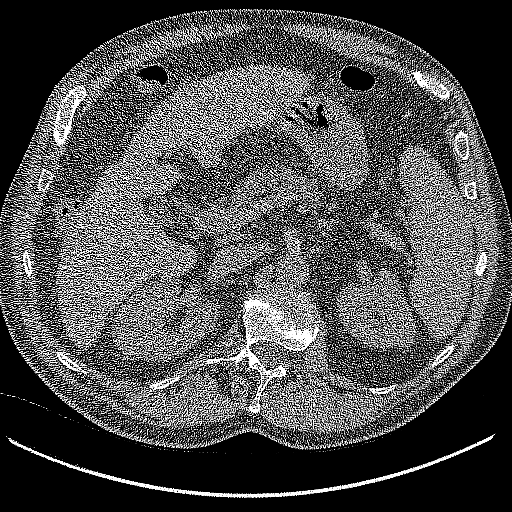
[im 16/324  lung]
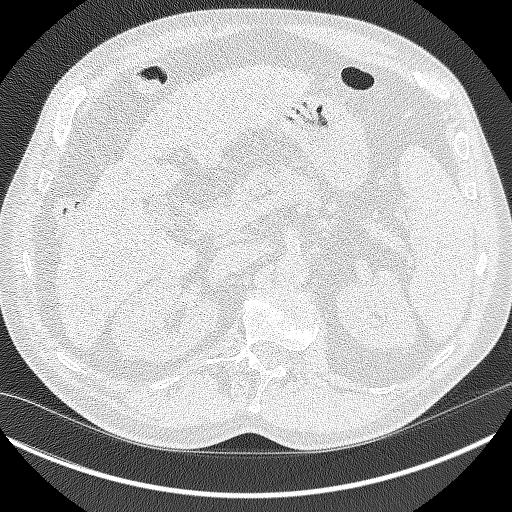
[im 47/324  lung]
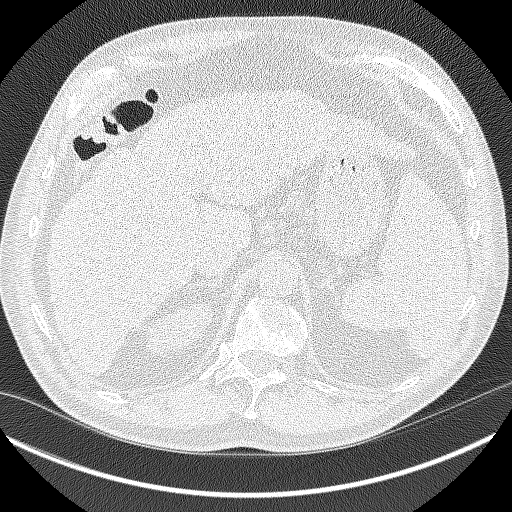
[im 77/324  lung]
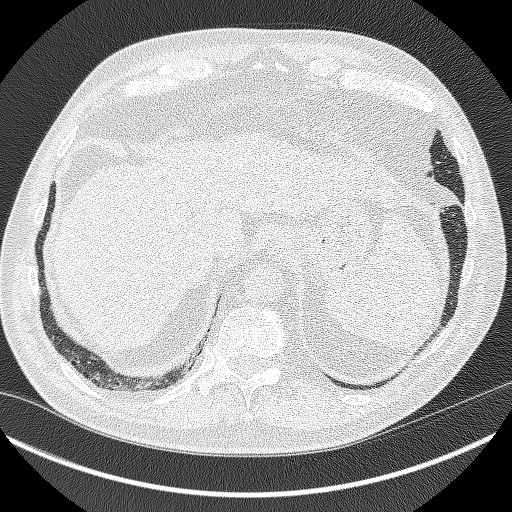
[im 93/324  lung]
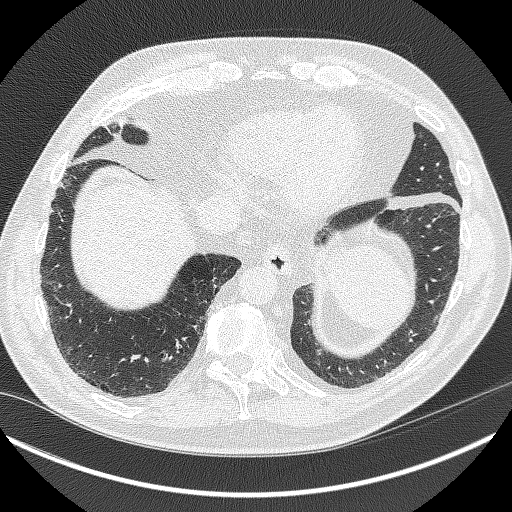
[im 124/324  mediastinal]
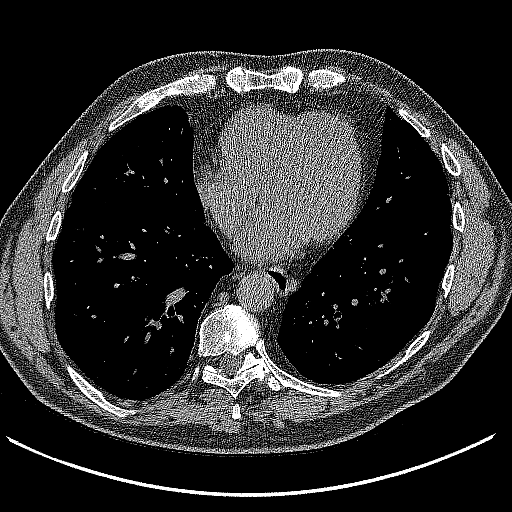
[im 124/324  lung]
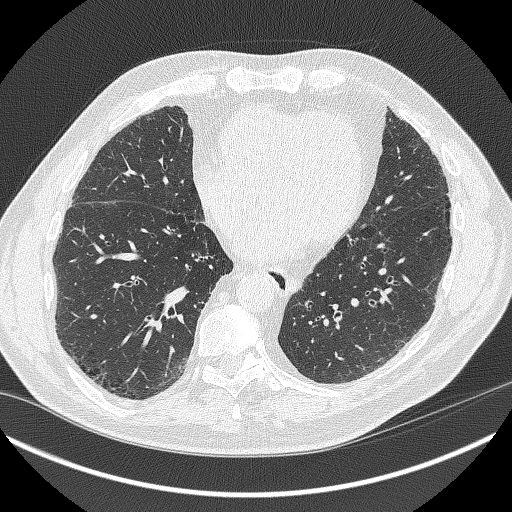
[im 154/324  lung]
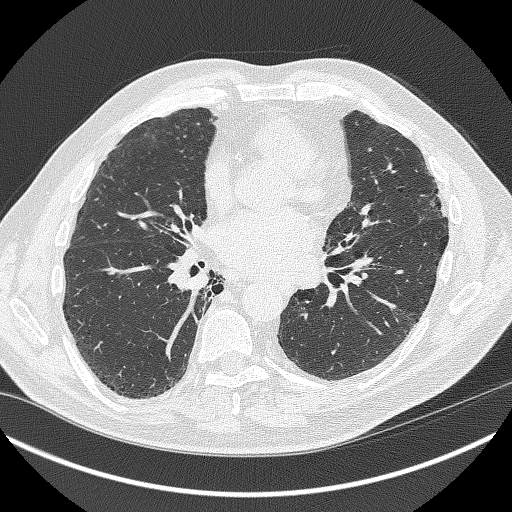
[im 170/324  lung]
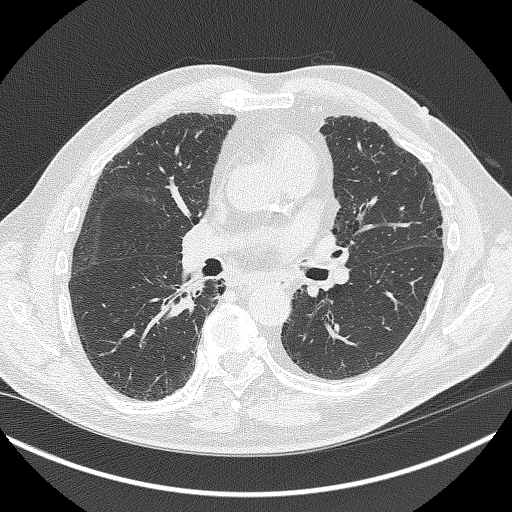
[im 200/324  lung]
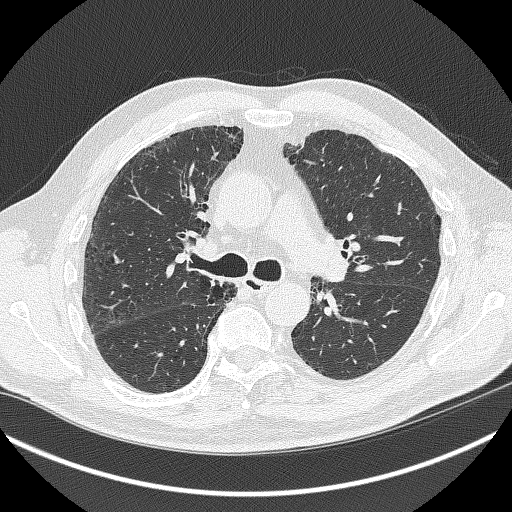
[im 231/324  mediastinal]
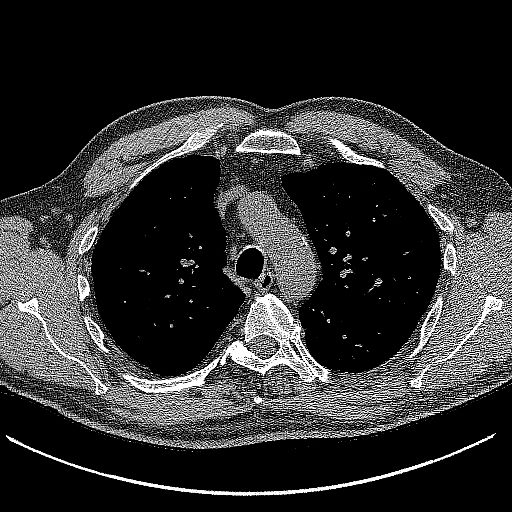
[im 231/324  lung]
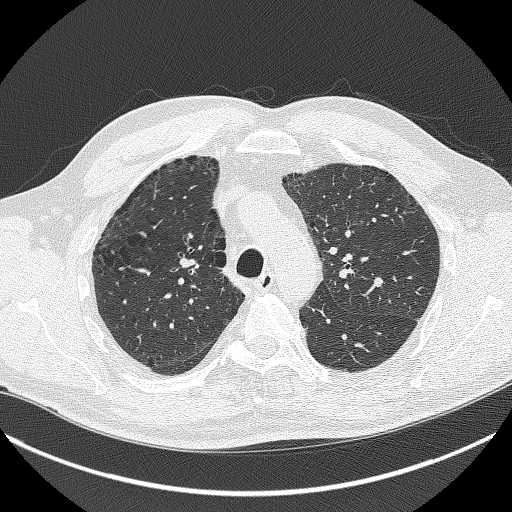
[im 247/324  lung]
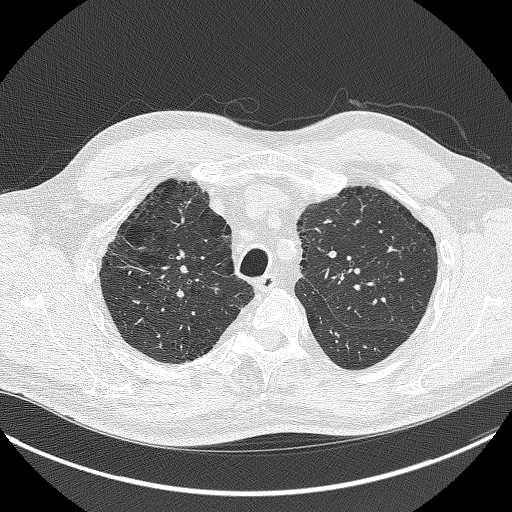
[im 277/324  lung]
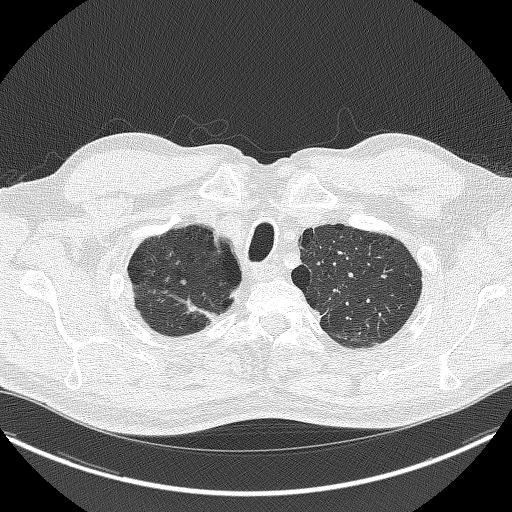
[im 308/324  lung]
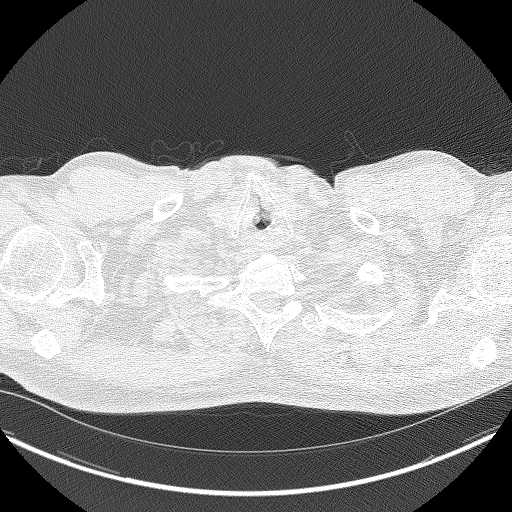

[Series 5: coronals lung 1.00 cor · coronal · 0.63mm/px · 3 of 306 slices shown]
[im 62/306  lung]
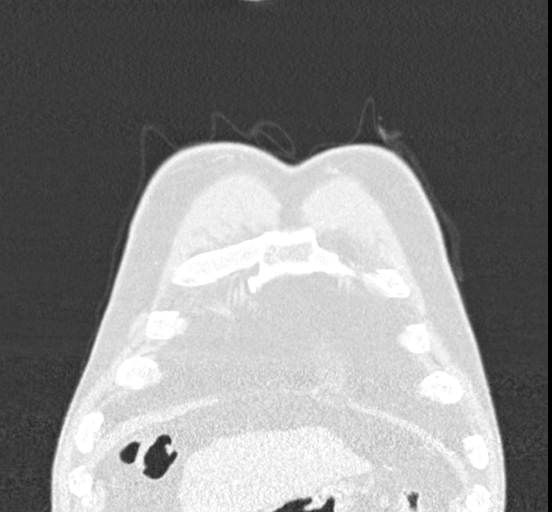
[im 123/306  lung]
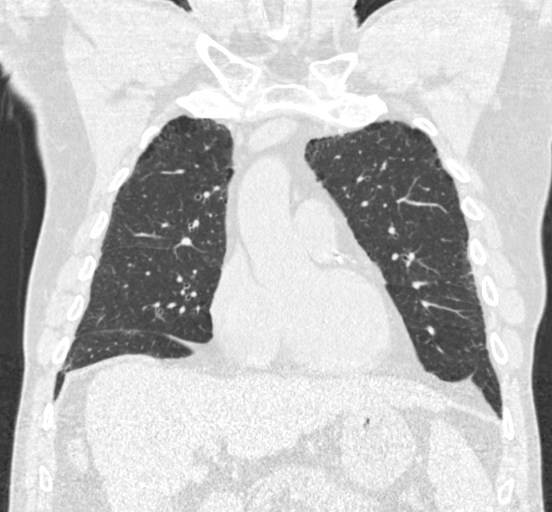
[im 184/306  lung]
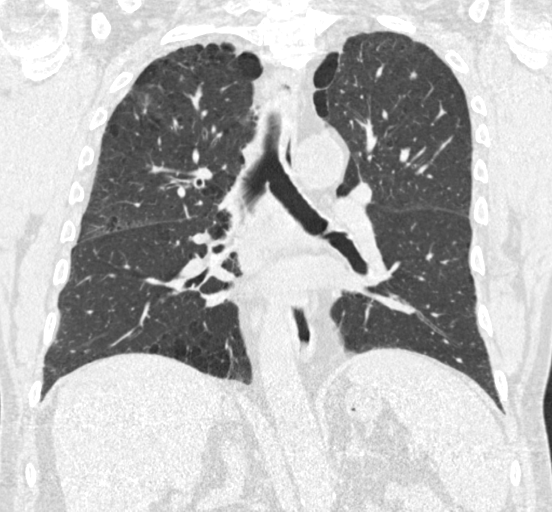

[15 of 40 positions shown; findings below may reference images not displayed]

FINDINGS: Cardiovascular: Bovine arch. Advanced aortic and branch vessel
atherosclerosis. Tortuous thoracic aorta. Normal heart size, without
pericardial effusion. Multivessel coronary artery atherosclerosis.

Mediastinum/Nodes: Subcarinal node of 1.0 cm is upper normal sized
and decreased since the prior exam, likely reactive. Hilar regions
poorly evaluated without intravenous contrast.

Lungs/Pleura: No pleural fluid. Moderate centrilobular and
paraseptal emphysema. Biapical pleuroparenchymal scarring.

Isolated subpleural right lower lobe pulmonary nodule of volume
derived equivalent diameter 3.2 mm.

Upper Abdomen: Advanced cirrhosis. Normal imaged portions of the
spleen, stomach, pancreas, gallbladder, adrenal glands, kidneys.

Musculoskeletal: Moderate to marked S-shaped thoracic spine
curvature. Mild T12 superior endplate wedging, without canal
compromise.
IMPRESSION: 1. Lung-RADS 2, benign appearance or behavior. Continue annual
screening with low-dose chest CT without contrast in 12 months.
2. Aortic Atherosclerosis ([WM]-[WM]) and Emphysema ([WM]-[WM]).
Coronary artery atherosclerosis.

## 2021-02-23 NOTE — Telephone Encounter (Signed)
On 4/25-spoke to patient regarding results of the ultrasound abdomen negative for any ascites.    Recommend follow-up- in 6 months- MD; labs- cbc/cmp/APF; iron studies/ferritin.; possible phlebotomy.   GB

## 2021-02-23 NOTE — Progress Notes (Signed)
Virtual Visit via Video Enabled Telemedicine Note   I connected with Gabriel Hoffman on 02/23/21 at 1:15 PM EST by video enabled telemedicine visit and verified that I am speaking with the correct person using two identifiers.   I discussed the limitations, risks, security and privacy concerns of performing an evaluation and management service by telemedicine and the availability of in-person appointments. I also discussed with the patient that there may be a patient responsible charge related to this service. The patient expressed understanding and agreed to proceed.   Other persons participating in the visit and their role in the encounter: Burgess Estelle, RN- checking in patient & navigation  Patient's location: [place]  Provider's location: home  Chief Complaint: Low Dose CT Screening  Patient agreed to evaluation by telemedicine to discuss shared decision making for consideration of low dose CT lung cancer screening.    In accordance with CMS guidelines, patient has met eligibility criteria including age, absence of signs or symptoms of lung cancer.  Social History   Tobacco Use  . Smoking status: Current Every Day Smoker    Packs/day: 1.00    Years: 50.00    Pack years: 50.00    Types: Cigarettes  . Smokeless tobacco: Never Used  . Tobacco comment: pack last about 4 days now 01-12-2021  Substance Use Topics  . Alcohol use: Never     A shared decision-making session was conducted prior to the performance of CT scan. This includes one or more decision aids, includes benefits and harms of screening, follow-up diagnostic testing, over-diagnosis, false positive rate, and total radiation exposure.   Counseling on the importance of adherence to annual lung cancer LDCT screening, impact of co-morbidities, and ability or willingness to undergo diagnosis and treatment is imperative for compliance of the program.   Counseling on the importance of continued smoking cessation for  former smokers; the importance of smoking cessation for current smokers, and information about tobacco cessation interventions have been given to patient including [place] and [employer] [place] programs.   Written order for lung cancer screening with LDCT has been given to the patient and any and all questions have been answered to the best of my abilities.    Yearly follow up will be coordinated by Burgess Estelle, Thoracic Navigator.  I discussed the assessment and treatment plan with the patient. The patient was provided an opportunity to ask questions and all were answered. The patient agreed with the plan and demonstrated an understanding of the instructions.   The patient was advised to call back or seek an in-person evaluation if the symptoms worsen or if the condition fails to improve as anticipated.   I provided 15 minutes of face-to-face video visit time dedicated to the care of this patient on the date of this encounter to include pre-visit review of smoking history, face-to-face time with the patient, and post visit ordering of testing/documentation.   Beckey Rutter, DNP, AGNP-C [place] at [facility] [phone] (clinic)

## 2021-02-24 ENCOUNTER — Telehealth: Payer: Self-pay

## 2021-02-24 ENCOUNTER — Telehealth: Payer: Self-pay | Admitting: *Deleted

## 2021-02-24 ENCOUNTER — Ambulatory Visit (INDEPENDENT_AMBULATORY_CARE_PROVIDER_SITE_OTHER): Payer: Medicaid Other | Admitting: Cardiology

## 2021-02-24 ENCOUNTER — Encounter: Payer: Self-pay | Admitting: Cardiology

## 2021-02-24 ENCOUNTER — Other Ambulatory Visit: Payer: Self-pay

## 2021-02-24 ENCOUNTER — Telehealth: Payer: Medicaid Other | Admitting: Nurse Practitioner

## 2021-02-24 VITALS — BP 130/72 | HR 75 | Ht 65.0 in | Wt 180.0 lb

## 2021-02-24 DIAGNOSIS — I480 Paroxysmal atrial fibrillation: Secondary | ICD-10-CM | POA: Diagnosis not present

## 2021-02-24 DIAGNOSIS — E7800 Pure hypercholesterolemia, unspecified: Secondary | ICD-10-CM | POA: Diagnosis not present

## 2021-02-24 DIAGNOSIS — I10 Essential (primary) hypertension: Secondary | ICD-10-CM

## 2021-02-24 MED ORDER — RIVAROXABAN 20 MG PO TABS: 20.0000 mg | ORAL_TABLET | Freq: Every day | ORAL | 1 refills | Status: DC

## 2021-02-24 NOTE — Progress Notes (Signed)
Cardiology Office Note:    Date:  02/24/2021   ID:  Gabriel Hoffman, DOB [birth year], MRN [id number]  PCP:  Lyndon Code, MD  Cardiologist:  No primary care provider on file.  Electrophysiologist:  None   Referring MD: Lyndon Code, MD   Chief Complaint  Patient presents with  . Other    6 month follow up. Meds reviewed verbally with patient.     History of Present Illness:    Gabriel Hoffman is a 65 y.o. male with a hx of hypertension, hyperlipidemia, atrial fibrillation status post ablation in September 2020, ?prior MI in (Feb 2020), current smoker who presents for follow-up.    Being seen for paroxysmal atrial fibrillation.  Denies palpitations, shortness of breath or edema.  Tolerating Xarelto without any bleeding issues.  Feels well, has no concerns at this time.  Blood pressures well controlled.  Has cut down smoking, thinks he is about to quit.   Prior notes Echocardiogram 01/2020 normal systolic function, EF 55 to 60%, impaired relaxation, normal LA size.  Patient states having a heart attack in February 2020 while he was incarcerated.  He was taken to [facility] in [facility].  He states being there for 1 week, no procedures were performed and he was placed on meds.  In September of he states being taken to [facility] where he was diagnosed with atrial fibrillation.  He had an ablation and was started on Xarelto.  He recently followed up with a primary care provider in the area to establish care.  His blood pressures were elevated and he was started on hydrochlorothiazide 12.5 mg daily.  He is a current smoker and is working on quitting.    Past Medical History:  Diagnosis Date  . Cirrhosis (HCC) 06/15/2020  . Hepatitis C 06/15/2020  . Hypertension   . Thyroid disease     Past Surgical History:  Procedure Laterality Date  . CARDIAC CATHETERIZATION    . CATARACT EXTRACTION    . LEG SURGERY Right    rod in leg  . WRIST SURGERY  Left     Current Medications: Current Meds  Medication Sig  . ALPRAZolam (XANAX) 0.25 MG tablet Take 1 tablet (0.25 mg total) by mouth at bedtime as needed for anxiety.  Marland Kitchen atorvastatin (LIPITOR) 10 MG tablet Take 1 tablet (10 mg total) by mouth daily.  . hydrochlorothiazide (HYDRODIURIL) 25 MG tablet Take 1 tablet (25 mg total) by mouth daily.  Marland Kitchen levothyroxine (SYNTHROID) 125 MCG tablet Take 1 tablet (125 mcg total) by mouth daily.  . metoprolol succinate (TOPROL-XL) 100 MG 24 hr tablet Take 1 tablet (100 mg total) by mouth daily. Take with or immediately following a meal.  . pantoprazole (PROTONIX) 20 MG tablet Take 1 tablet (20 mg total) by mouth daily.  . traZODone (DESYREL) 50 MG tablet Take 0.5-1 tablets (25-50 mg total) by mouth at bedtime as needed for sleep.  . [DISCONTINUED] rivaroxaban (XARELTO) 20 MG TABS tablet Take 1 tablet (20 mg total) by mouth daily with supper.     Allergies:   Patient has no known allergies.   Social History   Socioeconomic History  . Marital status: Single    Spouse name: Not on file  . Number of children: Not on file  . Years of education: Not on file  . Highest education level: Not on file  Occupational History  . Not on file  Tobacco Use  . Smoking status: Current  Every Day Smoker    Packs/day: 1.00    Years: 50.00    Pack years: 50.00    Types: Cigarettes  . Smokeless tobacco: Never Used  . Tobacco comment: pack last about 4 days now 01-12-2021  Vaping Use  . Vaping Use: Never used  Substance and Sexual Activity  . Alcohol use: Never  . Drug use: Never  . Sexual activity: Not on file  Other Topics Concern  . Not on file  Social History Narrative   Alcohol/beer once/twice a month; 1/2 ppd; used to work in [employer]. In ; with sister.    Social Determinants of Health   Financial Resource Strain: Not on file  Food Insecurity: Not on file  Transportation Needs: Not on file  Physical Activity: Not on file  Stress: Not  on file  Social Connections: Not on file     Family History: The patient's family history includes COPD in his mother; Cancer in his mother and sister.  ROS:   Please see the history of present illness.     All other systems reviewed and are negative.  EKGs/Labs/Other Studies Reviewed:    The following studies were reviewed today:   EKG:  EKG is  ordered today.  The ekg ordered today demonstrates normal sinus rhythm, normal ECG.  Recent Labs: 05/13/2020: Magnesium 2.0 02/17/2021: ALT 23; BUN 17; Creatinine, Ser 1.08; Hemoglobin 16.0; Platelets 106; Potassium 4.4; Sodium 141; TSH 8.680  Recent Lipid Panel    Component Value Date/Time   CHOL 117 02/17/2021 1136   TRIG 116 02/17/2021 1136   HDL 28 (L) 02/17/2021 1136   LDLCALC 68 02/17/2021 1136    Physical Exam:    VS:  BP 130/72 (BP Location: Left Arm, Patient Position: Sitting, Cuff Size: Normal)   Pulse 75   Ht 5\' 5"  (1.651 m)   Wt 180 lb (81.6 kg)   SpO2 96%   BMI 29.95 kg/m     Wt Readings from Last 3 Encounters:  02/24/21 180 lb (81.6 kg)  02/23/21 185 lb (83.9 kg)  02/12/21 183 lb (83 kg)     GEN:  Well nourished, well developed in no acute distress HEENT: Normal NECK: No JVD; No carotid bruits LYMPHATICS: No lymphadenopathy CARDIAC: RRR, no murmurs, rubs, gallops RESPIRATORY:  Clear to auscultation without rales ABDOMEN: Soft, non-tender, non-distended MUSCULOSKELETAL:  No edema; No deformity  SKIN: Warm and dry` NEUROLOGIC:  Alert and oriented x 3 PSYCHIATRIC:  Normal affect   ASSESSMENT:    1. PAF (paroxysmal atrial fibrillation) (HCC)   2. Primary hypertension   3. Pure hypercholesterolemia    PLAN:    In order of problems listed above:  1. Paroxysmal atrial fibrillation s/p ablation September 2020 at [facility].  Maintaining sinus rhythm, continue Toprol-XL, Xarelto 2. Hypertension, BP controlled.  Continue HCTZ, Toprol-XL. 3. Hyperlipidemia, cholesterol controlled.  Continue  Lipitor. 4. Current smoker, encouraged and congratulated on cutting back.  Advised to quit.  Follow-up yearly.  Total encounter time [facility]  Greater than 50% was spent in counseling and coordination of care with the patient   This note was generated in part or whole with voice recognition software. Voice recognition is usually quite accurate but there are transcription errors that can and very often do occur. I apologize for any typographical errors that were not detected and corrected.  Medication Adjustments/Labs and Tests Ordered: Current medicines are reviewed at length with the patient today.  Concerns regarding medicines are outlined above.  Orders Placed  This Encounter  Procedures  . EKG 12-Lead   No orders of the defined types were placed in this encounter.   Patient Instructions  Medication Instructions:  Your physician recommends that you continue on your current medications as directed. Please refer to the Current Medication list given to you today.  *If you need a refill on your cardiac medications before your next appointment, please call your pharmacy*   Lab Work: None ordered If you have labs (blood work) drawn today and your tests are completely normal, you will receive your results only by: Marland Kitchen MyChart Message (if you have MyChart) OR . A paper copy in the mail If you have any lab test that is abnormal or we need to change your treatment, we will call you to review the results.   Testing/Procedures: None ordered   Follow-Up: At [facility], you and your health needs are our priority.  As part of our continuing mission to provide you with exceptional heart care, we have created designated Provider Care Teams.  These Care Teams include your primary Cardiologist (physician) and Advanced Practice Providers (APPs -  Physician Assistants and Nurse Practitioners) who all work together to provide you with the care you need, when you need it.  We recommend  signing up for the patient portal called "MyChart".  Sign up information is provided on this After Visit Summary.  MyChart is used to connect with patients for Virtual Visits (Telemedicine).  Patients are able to view lab/test results, encounter notes, upcoming appointments, etc.  Non-urgent messages can be sent to your provider as well.   To learn more about what you can do with MyChart, go to [email or web address].    Your next appointment:   1 year(s)  The format for your next appointment:   In Person  Provider:   Debbe Odea, MD   Other Instructions      Signed, Debbe Odea, MD  02/24/2021 10:09 AM    [facility]

## 2021-02-24 NOTE — Telephone Encounter (Signed)
Spoke with [facility] ortho receptionist. She stated they have received referral for patient, but they need previous surgical notes as well. I explained to her, we do not have access to the notes. They will contact patient to come to their office to sign release form.

## 2021-02-24 NOTE — Telephone Encounter (Signed)
On 02/23/21. Patient's best friend, Lorrin Mais, dropped off disability forms to office for Dr. Donneta Romberg to complete. Patient in care and care giver stated that pt was unable to get out of care to deliver the forms himself. I spoke with Dr. Donneta Romberg on 02/24/21. Dr. Donneta Romberg will not complete these forms. The pcp needs to be able to complete these forms. Pt is currently following up with Dr. Donneta Romberg.  I attempted to reach the patient at the given phone number. Left detailed vm as pt did not answer.  Also sent mychart message to the patient.  I will place the original blank forms at the check in desk for patient to pick up.

## 2021-02-24 NOTE — Patient Instructions (Signed)

## 2021-02-24 NOTE — Addendum Note (Signed)
Addended by: Keitha Butte on: 02/24/2021 01:48 PM   Modules accepted: Orders

## 2021-02-24 NOTE — Telephone Encounter (Signed)
Patient being seen in the office today.  Patient stated he has been out of xarelto 20MG  for about 2 days.  Provided him with samples and told him I would make sure to let our anticoag know he needs a refill.

## 2021-02-24 NOTE — Telephone Encounter (Signed)
02/24/2021 F/u appts made for 08/26/21. Spoke w/ pt and he confirmed all appts  SRW

## 2021-02-24 NOTE — Telephone Encounter (Signed)
Prescription refill request for Xarelto received.  Indication: PAF Last office visit: 02/24/21 Weight: 81.6kg Age: 65 Scr: 1.08 CrCl: 81.85  Based on above findings Xarelto 20mg  daily is the appropriate dose.  Refill approved.

## 2021-02-26 ENCOUNTER — Telehealth: Payer: Self-pay

## 2021-02-26 NOTE — Telephone Encounter (Signed)
Patient wants to know if his MRI results from Monday are available? Says he hasn't rec any calls.

## 2021-02-27 ENCOUNTER — Telehealth: Payer: Self-pay | Admitting: *Deleted

## 2021-02-27 NOTE — Telephone Encounter (Signed)
Shawn or Doni, could one of you please call this patient to review results? If I need to speak with them, let me know. Thanks!

## 2021-02-27 NOTE — Telephone Encounter (Signed)
Patient sister Elease Hashimoto called stating that patient does not understand things very well and she would like a return call from Lauren to discuss the CT results from 4/25  IMPRESSION: 1. Lung-RADS 2, benign appearance or behavior. Continue annual screening with low-dose chest CT without contrast in 12 months. 2. Aortic Atherosclerosis (ICD10-I70.0) and Emphysema (ICD10-J43.9). Coronary artery atherosclerosis.   Electronically Signed   By: Jeronimo Greaves M.D.   On: 02/23/2021 21:52

## 2021-02-27 NOTE — Telephone Encounter (Signed)
Pt notified to call dr Freida Busman lauren they order test please call them for result

## 2021-03-04 ENCOUNTER — Ambulatory Visit: Payer: Medicaid Other

## 2021-03-04 ENCOUNTER — Telehealth: Payer: Self-pay

## 2021-03-04 ENCOUNTER — Other Ambulatory Visit: Payer: Self-pay

## 2021-03-04 DIAGNOSIS — E010 Iodine-deficiency related diffuse (endemic) goiter: Secondary | ICD-10-CM

## 2021-03-04 NOTE — Telephone Encounter (Signed)
Patient says he left papers with Ginger on last week. Wants to know if the forms are ready for pickup?

## 2021-03-05 ENCOUNTER — Telehealth: Payer: Self-pay | Admitting: *Deleted

## 2021-03-05 NOTE — Telephone Encounter (Signed)
Notified patient of LDCT lung cancer screening program results with recommendation for 12 month follow up imaging. Also notified of incidental findings noted below and is encouraged to discuss further with PCP who will receive a copy of this note and/or the CT report. Patient verbalizes understanding.   IMPRESSION: 1. Lung-RADS 2, benign appearance or behavior. Continue annual screening with low-dose chest CT without contrast in 12 months. 2. Aortic Atherosclerosis (ICD10-I70.0) and Emphysema (ICD10-J43.9). Coronary artery atherosclerosis.  

## 2021-03-10 NOTE — Telephone Encounter (Signed)
Contact pt regarding his disability paperwork. Informed him Dr. Servando Snare is unable to complete as its asking for a complete physical exam as well as a mental assessment. This is not within his scope to do for this pt. Pt did advise me he has an appt with a disability doctor in [place] on 03/11/21. Pt will pick the paperwork back up today.

## 2021-03-11 ENCOUNTER — Other Ambulatory Visit: Payer: Medicaid Other

## 2021-03-27 ENCOUNTER — Ambulatory Visit: Payer: Medicaid Other | Admitting: Nurse Practitioner

## 2021-03-27 ENCOUNTER — Other Ambulatory Visit: Payer: Self-pay

## 2021-03-27 ENCOUNTER — Encounter: Payer: Self-pay | Admitting: Nurse Practitioner

## 2021-03-27 VITALS — BP 154/78 | HR 73 | Temp 97.3°F | Resp 16 | Ht 66.0 in | Wt 181.0 lb

## 2021-03-27 DIAGNOSIS — Z23 Encounter for immunization: Secondary | ICD-10-CM

## 2021-03-27 DIAGNOSIS — N522 Drug-induced erectile dysfunction: Secondary | ICD-10-CM | POA: Diagnosis not present

## 2021-03-27 DIAGNOSIS — F5101 Primary insomnia: Secondary | ICD-10-CM | POA: Diagnosis not present

## 2021-03-27 DIAGNOSIS — I10 Essential (primary) hypertension: Secondary | ICD-10-CM | POA: Diagnosis not present

## 2021-03-27 DIAGNOSIS — Z712 Person consulting for explanation of examination or test findings: Secondary | ICD-10-CM | POA: Diagnosis not present

## 2021-03-27 DIAGNOSIS — Z79899 Other long term (current) drug therapy: Secondary | ICD-10-CM | POA: Diagnosis not present

## 2021-03-27 LAB — POCT URINE DRUG SCREEN
Methylenedioxyamphetamine: NOT DETECTED
POC Amphetamine UR: NOT DETECTED
POC BENZODIAZEPINES UR: NOT DETECTED
POC Barbiturate UR: NOT DETECTED
POC Cocaine UR: NOT DETECTED
POC Ecstasy UR: NOT DETECTED
POC Marijuana UR: NOT DETECTED
POC Methadone UR: NOT DETECTED
POC Methamphetamine UR: NOT DETECTED
POC Opiate Ur: NOT DETECTED
POC Oxycodone UR: NOT DETECTED
POC PHENCYCLIDINE UR: NOT DETECTED
POC TRICYCLICS UR: NOT DETECTED

## 2021-03-27 MED ORDER — METOPROLOL SUCCINATE ER 100 MG PO TB24: 100.0000 mg | ORAL_TABLET | Freq: Every day | ORAL | 0 refills | Status: DC

## 2021-03-27 MED ORDER — ALPRAZOLAM 0.5 MG PO TABS: 0.5000 mg | ORAL_TABLET | Freq: Every evening | ORAL | 2 refills | Status: DC | PRN

## 2021-03-27 MED ORDER — LISINOPRIL 10 MG PO TABS: 10.0000 mg | ORAL_TABLET | Freq: Every day | ORAL | 0 refills | Status: DC

## 2021-03-27 NOTE — Progress Notes (Signed)
[facility] [address] [place], [place] [address]  Internal MEDICINE  Office Visit Note  Patient Name: Gabriel Hoffman  [id number]  [id number]  Date of Service: 03/30/2021  Chief Complaint  Patient presents with  . Follow-up    Review results, refills  . Hypertension  . Quality Metric Gaps    Shingrix     HPI Aamari presents for a follow up visit to discuss ultrasound and CT scan results. He is also requesting refills and has hypertension.  - thyroid ultrasound was normal, no abnormalities identified.  -CT scan of the chest showed benign changes.  -Blood pressure is 154/78 today. He is taking metoprolol succinate and hydrochlorothiazide for blood pressure. He reports being unable to have or sustain an erection. This was explained to him as a side effect of metoprolol. Switching from a beta blocker to a different antihypertensive medication was discussed and he was agreeable to this plan.  He is also due for the shingrix vaccine. He expressed interest in receiving it.   Current Medication: Outpatient Encounter Medications as of 03/27/2021  Medication Sig  . ALPRAZolam (XANAX) 0.5 MG tablet Take 1 tablet (0.5 mg total) by mouth at bedtime as needed for anxiety or sleep.  Marland Kitchen lisinopril (ZESTRIL) 10 MG tablet Take 1 tablet (10 mg total) by mouth daily.  Marland Kitchen atorvastatin (LIPITOR) 10 MG tablet Take 1 tablet (10 mg total) by mouth daily.  . hydrochlorothiazide (HYDRODIURIL) 25 MG tablet Take 1 tablet (25 mg total) by mouth daily.  Marland Kitchen levothyroxine (SYNTHROID) 125 MCG tablet Take 1 tablet (125 mcg total) by mouth daily.  . pantoprazole (PROTONIX) 20 MG tablet Take 1 tablet (20 mg total) by mouth daily.  . rivaroxaban (XARELTO) 20 MG TABS tablet Take 1 tablet (20 mg total) by mouth daily with supper.  . traZODone (DESYREL) 50 MG tablet Take 0.5-1 tablets (25-50 mg total) by mouth at bedtime as needed for sleep.  . [DISCONTINUED] ALPRAZolam (XANAX) 0.25 MG tablet Take 1 tablet  (0.25 mg total) by mouth at bedtime as needed for anxiety.  . [DISCONTINUED] metoprolol succinate (TOPROL-XL) 100 MG 24 hr tablet Take 1 tablet (100 mg total) by mouth daily. Take with or immediately following a meal.  . [DISCONTINUED] metoprolol succinate (TOPROL-XL) 100 MG 24 hr tablet Take 1 tablet (100 mg total) by mouth daily. Take with or immediately following a meal.   No facility-administered encounter medications on file as of 03/27/2021.    Surgical History: Past Surgical History:  Procedure Laterality Date  . CARDIAC CATHETERIZATION    . CATARACT EXTRACTION    . LEG SURGERY Right    rod in leg  . WRIST SURGERY Left     Medical History: Past Medical History:  Diagnosis Date  . Cirrhosis (HCC) 06/15/2020  . Hepatitis C 06/15/2020  . Hypertension   . Thyroid disease     Family History: Family History  Problem Relation Age of Onset  . Cancer Mother   . COPD Mother   . Cancer Sister     Social History   Socioeconomic History  . Marital status: Single    Spouse name: Not on file  . Number of children: Not on file  . Years of education: Not on file  . Highest education level: Not on file  Occupational History  . Not on file  Tobacco Use  . Smoking status: Current Every Day Smoker    Packs/day: 1.00    Years: 50.00    Pack years: 50.00  Types: Cigarettes  . Smokeless tobacco: Never Used  . Tobacco comment: pack last about 4 days now 01-12-2021  Vaping Use  . Vaping Use: Never used  Substance and Sexual Activity  . Alcohol use: Never  . Drug use: Never  . Sexual activity: Not on file  Other Topics Concern  . Not on file  Social History Narrative   Alcohol/beer once/twice a month; 1/2 ppd; used to work in [employer]. In [place]; with sister.    Social Determinants of Health   Financial Resource Strain: Not on file  Food Insecurity: Not on file  Transportation Needs: Not on file  Physical Activity: Not on file  Stress: Not on file  Social  Connections: Not on file  Intimate Partner Violence: Not on file      Review of Systems  Constitutional: Negative for chills, fatigue and unexpected weight change.  HENT: Negative for congestion, rhinorrhea, sneezing and sore throat.   Eyes: Negative for redness.  Respiratory: Negative.  Negative for cough, chest tightness and shortness of breath.   Cardiovascular: Negative.  Negative for chest pain and palpitations.  Gastrointestinal: Negative for abdominal pain, constipation, diarrhea, nausea and vomiting.  Genitourinary: Negative for dysuria and frequency.       Erectile dysfunction  Musculoskeletal: Negative for arthralgias, back pain, joint swelling and neck pain.  Skin: Negative for rash.  Neurological: Negative.  Negative for tremors and numbness.  Hematological: Negative for adenopathy. Does not bruise/bleed easily.  Psychiatric/Behavioral: Negative for behavioral problems (Depression), sleep disturbance and suicidal ideas. The patient is not nervous/anxious.     Vital Signs: BP (!) 154/78   Pulse 73   Temp (!) 97.3 F (36.3 C)   Resp 16   Ht 5\' 6"  (1.676 m)   Wt 181 lb (82.1 kg)   SpO2 97%   BMI 29.21 kg/m    Physical Exam Vitals reviewed.  Constitutional:      General: He is not in acute distress.    Appearance: Normal appearance. He is well-developed and overweight. He is not diaphoretic.  HENT:     Head: Normocephalic and atraumatic.  Neck:     Thyroid: No thyromegaly.     Vascular: No JVD.     Trachea: No tracheal deviation.  Cardiovascular:     Rate and Rhythm: Normal rate and regular rhythm.     Pulses: Normal pulses.     Heart sounds: Normal heart sounds. No murmur heard. No friction rub. No gallop.   Pulmonary:     Effort: Pulmonary effort is normal. No respiratory distress.     Breath sounds: No wheezing or rales.  Chest:     Chest wall: No tenderness.  Skin:    General: Skin is warm and dry.     Capillary Refill: Capillary refill takes less  than 2 seconds.  Neurological:     Mental Status: He is alert and oriented to person, place, and time.     Cranial Nerves: No cranial nerve deficit.  Psychiatric:        Mood and Affect: Mood normal.        Behavior: Behavior normal.    Assessment/Plan: 1. Essential hypertension Metoprolol dsicontinued due to ED, start lisinopril, follow up in 4 weeks to evaluate effect of new medication. - lisinopril (ZESTRIL) 10 MG tablet; Take 1 tablet (10 mg total) by mouth daily.  Dispense: 30 tablet; Refill: 0  2. Drug-induced erectile dysfunction Stopped metoprolol which has a side effect of ED. Switched BP medications. This  problem should resolve now that he is off metoprolol. Patient instructed to call the clinic if he has any concerns or his condition does not improve.   3. Primary insomnia Taking alprazolam for sleep, increased dose to 0.5 and ordered refill. - ALPRAZolam (XANAX) 0.5 MG tablet; Take 1 tablet (0.5 mg total) by mouth at bedtime as needed for anxiety or sleep.  Dispense: 30 tablet; Refill: 2  4. Encounter to discuss test results Ultrasound thyroid normal, CT chest showed benign changes. No other test results to discuss. Repeat CT scan in 1 year. Patient understands and has no questions or concerns at this time.   5. Encounter for immunization Patient would like to get the shingles vaccine series. First dose ordered and sent to the pharmacy.  - Zoster Vaccine Adjuvanted [facility]) injection; Inject 0.5 mLs into the muscle once for 1 dose.  Dispense: 0.5 mL; Refill: 0  6. Encounter for long-term (current) use of high-risk medication Taking alprazolam, urine drug screen results consistent with current prescriptions.  - POCT Urine Drug Screen   General Counseling: garland hincapie understanding of the findings of todays visit and agrees with plan of treatment. I have discussed any further diagnostic evaluation that may be needed or ordered today. We also reviewed his  medications today. he has been encouraged to call the office with any questions or concerns that should arise related to todays visit.    Orders Placed This Encounter  Procedures  . POCT Urine Drug Screen    Meds ordered this encounter  Medications  . ALPRAZolam (XANAX) 0.5 MG tablet    Sig: Take 1 tablet (0.5 mg total) by mouth at bedtime as needed for anxiety or sleep.    Dispense:  30 tablet    Refill:  2  . DISCONTD: metoprolol succinate (TOPROL-XL) 100 MG 24 hr tablet    Sig: Take 1 tablet (100 mg total) by mouth daily. Take with or immediately following a meal.    Dispense:  90 tablet    Refill:  0  . lisinopril (ZESTRIL) 10 MG tablet    Sig: Take 1 tablet (10 mg total) by mouth daily.    Dispense:  30 tablet    Refill:  0   Return in about 4 weeks (around 04/24/2021) for F/U, med refill, BP check, Vincie Linn PCP.   Total time spent:30 Minutes Time spent includes review of chart, medications, test results, and follow up plan with the patient.   [place] Controlled Substance Database was reviewed by me.  This patient was seen by Sallyanne Kuster, FNP-C in collaboration with Dr. Beverely Risen as a part of collaborative care agreement.  Sallyanne Kuster, MSN, FNP-C Internal medicine

## 2021-03-30 MED ORDER — SHINGRIX 50 MCG/0.5ML IM SUSR: 0.5000 mL | Freq: Once | INTRAMUSCULAR | 0 refills | Status: AC

## 2021-04-07 ENCOUNTER — Other Ambulatory Visit: Payer: Self-pay | Admitting: Orthopedic Surgery

## 2021-04-23 ENCOUNTER — Other Ambulatory Visit: Payer: Self-pay

## 2021-04-23 ENCOUNTER — Encounter
Admission: RE | Admit: 2021-04-23 | Discharge: 2021-04-23 | Disposition: A | Discharge status: Home / Self Care | Payer: Medicaid Other | Source: Ambulatory Visit | Attending: Orthopedic Surgery | Admitting: Orthopedic Surgery

## 2021-04-23 HISTORY — DX: Gastro-esophageal reflux disease without esophagitis: K21.9

## 2021-04-23 HISTORY — DX: Solitary pulmonary nodule: R91.1

## 2021-04-23 HISTORY — DX: Thrombocytopenia, unspecified: D69.6

## 2021-04-23 HISTORY — DX: Chronic obstructive pulmonary disease, unspecified: J44.9

## 2021-04-23 HISTORY — DX: Other complications of anesthesia, initial encounter: T88.59XA

## 2021-04-23 HISTORY — DX: Anxiety disorder, unspecified: F41.9

## 2021-04-23 HISTORY — DX: Hypothyroidism, unspecified: E03.9

## 2021-04-23 HISTORY — DX: Unspecified osteoarthritis, unspecified site: M19.90

## 2021-04-23 HISTORY — DX: Unspecified atrial fibrillation: I48.91

## 2021-04-23 HISTORY — DX: Hemochromatosis, unspecified: E83.119

## 2021-04-23 HISTORY — DX: Angina pectoris, unspecified: I20.9

## 2021-04-23 NOTE — Patient Instructions (Addendum)
Your procedure is scheduled on:04-30-21 Thursday Report to the Registration Desk on the 1st floor of the Medical Mall-Then proceed to the 2nd floor Surgery Desk in the Medical Mall To find out your arrival time, please call [phone] between 1PM - 3PM on:04-29-21 Wednesday  REMEMBER: Instructions that are not followed completely may result in serious medical risk, up to and including death; or upon the discretion of your surgeon and anesthesiologist your surgery may need to be rescheduled.  Do not eat food after midnight the night before surgery.  No gum chewing, lozengers or hard candies.  You may however, drink CLEAR liquids up to 2 hours before you are scheduled to arrive for your surgery. Do not drink anything within 2 hours of your scheduled arrival time.  Clear liquids include: - water  - apple juice without pulp - gatorade - black coffee or tea (Do NOT add milk or creamers to the coffee or tea) Do NOT drink anything that is not on this list.  In addition, your doctor has ordered for you to drink the provided  Ensure Pre-Surgery Clear Carbohydrate Drink  Drinking this carbohydrate drink up to two hours before surgery helps to reduce insulin resistance and improve patient outcomes. Please complete drinking 2 hours prior to scheduled arrival time.  TAKE THESE MEDICATIONS THE MORNING OF SURGERY WITH A SIP OF WATER: -Lipitor (Atorvastatin) -Synthroid (Levothyroxine) -Protonix (Pantoprazole)-take one the night before and one on the morning of surgery - helps to prevent nausea after surgery.) -You may take Xanax (Alprazolam) the day of surgery if needed  Stop your Xarelto 3 days prior to surgery as instructed by Dr Rosita Kea. Last dose will be on 04-26-21 Sunday  One week prior to surgery: Stop Anti-inflammatories (NSAIDS) such as Advil, Aleve, Ibuprofen, Motrin, Naproxen, Naprosyn and Aspirin based products such as Excedrin, Goodys Powder, BC Powder.You may however, continue to take  Tylenol if needed for pain up until the day of surgery.  Stop ANY OVER THE COUNTER supplements/vitamins NOW (04-23-21) until after surgery.  No Alcohol for 24 hours before or after surgery.  No Smoking including e-cigarettes for 24 hours prior to surgery.  No chewable tobacco products for at least 6 hours prior to surgery.  No nicotine patches on the day of surgery.  Do not use any "recreational" drugs for at least a week prior to your surgery.  Please be advised that the combination of cocaine and anesthesia may have negative outcomes, up to and including death. If you test positive for cocaine, your surgery will be cancelled.  On the morning of surgery brush your teeth with toothpaste and water, you may rinse your mouth with mouthwash if you wish. Do not swallow any toothpaste or mouthwash.  Do not wear jewelry, make-up, hairpins, clips or nail polish.  Do not wear lotions, powders, or perfumes.   Do not shave body from the neck down 48 hours prior to surgery just in case you cut yourself which could leave a site for infection.  Also, freshly shaved skin may become irritated if using the CHG soap.  Contact lenses, hearing aids and dentures may not be worn into surgery.  Do not bring valuables to the hospital. [facility] is not responsible for any missing/lost belongings or valuables.   Use CHG Soap as directed on instruction sheet.  Notify your doctor if there is any change in your medical condition (cold, fever, infection).  Wear comfortable clothing (specific to your surgery type) to the hospital.  After surgery,  you can help prevent lung complications by doing breathing exercises.  Take deep breaths and cough every 1-2 hours. Your doctor may order a device called an Incentive Spirometer to help you take deep breaths. When coughing or sneezing, hold a pillow firmly against your incision with both hands. This is called "splinting." Doing this helps protect your incision. It  also decreases belly discomfort.  If you are being admitted to the hospital overnight, leave your suitcase in the car. After surgery it may be brought to your room.  If you are being discharged the day of surgery, you will not be allowed to drive home. You will need a responsible adult (18 years or older) to drive you home and stay with you that night.   If you are taking public transportation, you will need to have a responsible adult (18 years or older) with you. Please confirm with your physician that it is acceptable to use public transportation.   Please call the Pre-admissions Testing Dept. at [phone] if you have any questions about these instructions.  Surgery Visitation Policy:  Patients undergoing a surgery or procedure may have one family member or support person with them as long as that person is not COVID-19 positive or experiencing its symptoms.  That person may remain in the waiting area during the procedure.  Inpatient Visitation:    Visiting hours are 7 a.m. to 8 p.m. Inpatients will be allowed two visitors daily. The visitors may change each day during the patient's stay. No visitors under the age of 43. Any visitor under the age of 44 must be accompanied by an adult. The visitor must pass COVID-19 screenings, use hand sanitizer when entering and exiting the patient's room and wear a mask at all times, including in the patient's room. Patients must also wear a mask when staff or their visitor are in the room. Masking is required regardless of vaccination status.

## 2021-04-24 ENCOUNTER — Other Ambulatory Visit: Payer: Self-pay

## 2021-04-24 ENCOUNTER — Ambulatory Visit: Payer: Medicaid Other | Admitting: Nurse Practitioner

## 2021-04-24 ENCOUNTER — Encounter: Payer: Self-pay | Admitting: Nurse Practitioner

## 2021-04-24 ENCOUNTER — Telehealth: Payer: Self-pay | Admitting: *Deleted

## 2021-04-24 VITALS — BP 136/86 | HR 82 | Temp 98.5°F | Resp 16 | Ht 66.0 in | Wt 174.6 lb

## 2021-04-24 DIAGNOSIS — E039 Hypothyroidism, unspecified: Secondary | ICD-10-CM

## 2021-04-24 DIAGNOSIS — I10 Essential (primary) hypertension: Secondary | ICD-10-CM

## 2021-04-24 DIAGNOSIS — E782 Mixed hyperlipidemia: Secondary | ICD-10-CM

## 2021-04-24 MED ORDER — LEVOTHYROXINE SODIUM 125 MCG PO TABS: 125.0000 ug | ORAL_TABLET | Freq: Every day | ORAL | 1 refills | Status: DC

## 2021-04-24 MED ORDER — LISINOPRIL 10 MG PO TABS: 10.0000 mg | ORAL_TABLET | Freq: Every day | ORAL | 2 refills | Status: DC

## 2021-04-24 MED ORDER — HYDROCHLOROTHIAZIDE 25 MG PO TABS: 25.0000 mg | ORAL_TABLET | Freq: Every day | ORAL | 1 refills | Status: DC

## 2021-04-24 MED ORDER — ATORVASTATIN CALCIUM 10 MG PO TABS: 10.0000 mg | ORAL_TABLET | Freq: Every day | ORAL | 5 refills | Status: DC

## 2021-04-24 NOTE — Telephone Encounter (Signed)
Request for pre-operative cardiac clearance Received: Bernette Mayers, Doris Cheadle, NP  P Cv Div Preop Callback Request for pre-operative cardiac clearance:     1. What type of surgery is being performed?  HARDWARE REMOVAL, RIGHT TIBIA IM NAIL REMOVAL   2. When is this surgery scheduled?  04/30/2021     3. Are there any medications that need to be held prior to surgery?  RIVAROXABAN x 3 days (Last dose 04/26/2021).   4. Practice name and name of physician performing surgery?  Performing surgeon: Dr. Hessie Knows, MD  Requesting clearance: Honor Loh, FNP-C       5. Anesthesia type (none, local, MAC, general)? General   6. What is the office phone and fax number?    Phone: [phone]  Fax: [phone]   ATTENTION: Unable to create telephone message as per your standard workflow. Directed by [facility] providers to send requests for cardiac clearance to this pool for appropriate distribution to provider covering pre-operative clearances.   Honor Loh, MSN, APRN, FNP-C, CEN  [facility]  Peri-operative Services Nurse Practitioner  Phone: [phone]  04/23/21 3:05 PM

## 2021-04-24 NOTE — Progress Notes (Signed)
[facility] [address] [place], [place] [address]  Internal MEDICINE  Office Visit Note  Patient Name: Gabriel Hoffman  [id number]  [id number]  Date of Service: 05/01/2021  Chief Complaint  Patient presents with   Follow-up    Med refill, Bp check, pain in right leg due to rod is out of place, patient is also waiting on surgery date      HPI Gabriel Hoffman presents for a follow up visit for hypertension and medication refill. He has a history of anxiety, arthritis, COPD, hyperlipidemia, hypertension, GERD, MI, and hypothyroidism. At his previous office visit,  Metoprolol was discontinued because of ED. He was started on lisinopril 10 mg daily instead and his blood pressure is well controlled at this office visit.  ED has improved since stopping the metoprolol.     Current Medication: Outpatient Encounter Medications as of 04/24/2021  Medication Sig   ALPRAZolam (XANAX) 0.5 MG tablet Take 1 tablet (0.5 mg total) by mouth at bedtime as needed for anxiety or sleep.   Aspirin-Salicylamide-Caffeine (BC FAST PAIN RELIEF) 650-195-33.3 MG PACK Take 1 packet by mouth daily as needed (leg pain).   Hypromell-Glycerin-Naphazoline (CLEAR EYES FOR DRY EYES PLUS) 0.8-0.25-0.012 % SOLN Place 1-2 drops into both eyes 3 (three) times daily as needed (dry/irritated eyes).   nitroGLYCERIN (NITROSTAT) 0.4 MG SL tablet Place 0.4 mg under the tongue every 5 (five) minutes x 3 doses as needed for chest pain.   [DISCONTINUED] atorvastatin (LIPITOR) 10 MG tablet Take 1 tablet (10 mg total) by mouth daily. (Patient taking differently: Take 10 mg by mouth in the morning.)   [DISCONTINUED] hydrochlorothiazide (HYDRODIURIL) 25 MG tablet Take 1 tablet (25 mg total) by mouth daily. (Patient taking differently: Take 25 mg by mouth in the morning.)   [DISCONTINUED] HYDROcodone-acetaminophen (NORCO/VICODIN) 5-325 MG tablet Take 1 tablet by mouth every 6 (six) hours as needed for moderate pain or severe pain.    [DISCONTINUED] levothyroxine (SYNTHROID) 125 MCG tablet Take 1 tablet (125 mcg total) by mouth daily. (Patient taking differently: Take 125 mcg by mouth daily before breakfast.)   [DISCONTINUED] lisinopril (ZESTRIL) 10 MG tablet Take 1 tablet (10 mg total) by mouth daily. (Patient taking differently: Take 10 mg by mouth in the morning.)   [DISCONTINUED] pantoprazole (PROTONIX) 20 MG tablet Take 1 tablet (20 mg total) by mouth daily. (Patient taking differently: Take 20 mg by mouth in the morning.)   [DISCONTINUED] rivaroxaban (XARELTO) 20 MG TABS tablet Take 1 tablet (20 mg total) by mouth daily with supper. (Patient taking differently: Take 20 mg by mouth in the morning.)   [DISCONTINUED] traZODone (DESYREL) 50 MG tablet Take 0.5-1 tablets (25-50 mg total) by mouth at bedtime as needed for sleep. (Patient taking differently: Take 50 mg by mouth at bedtime as needed for sleep.)   atorvastatin (LIPITOR) 10 MG tablet Take 1 tablet (10 mg total) by mouth daily.   hydrochlorothiazide (HYDRODIURIL) 25 MG tablet Take 1 tablet (25 mg total) by mouth daily.   levothyroxine (SYNTHROID) 125 MCG tablet Take 1 tablet (125 mcg total) by mouth daily.   lisinopril (ZESTRIL) 10 MG tablet Take 1 tablet (10 mg total) by mouth daily.   No facility-administered encounter medications on file as of 04/24/2021.    Surgical History: Past Surgical History:  Procedure Laterality Date   CARDIAC CATHETERIZATION     CATARACT EXTRACTION     COLONOSCOPY     HARDWARE REMOVAL Right 04/30/2021   Procedure: HARDWARE REMOVAL, Right tibia IM  nail removal;  Surgeon: Kennedy Bucker, MD;  Location: [facility] ORS;  Service: Orthopedics;  Laterality: Right;   HERNIA REPAIR Bilateral    LEG SURGERY Right    rod in leg   WRIST SURGERY Left     Medical History: Past Medical History:  Diagnosis Date   Anginal pain (HCC)    Anxiety    Aortic atherosclerosis (HCC)    Arthritis    Atrial fibrillation (HCC)    CAD (coronary artery  disease)    Carotid artery stenosis    Chronic anticoagulation    Rivaroxaban   Cirrhosis (HCC) 06/15/2020   Complication of anesthesia    seizure like activity with propofol during colonoscopy   COPD (chronic obstructive pulmonary disease) (HCC)    GERD (gastroesophageal reflux disease)    Grade I diastolic dysfunction    Hemochromatosis    Hepatitis C 06/15/2020   Tx'd with Harvoni course   HLD (hyperlipidemia)    Hypertension    Hypothyroidism    Lung nodule    Myocardial infarction (HCC) 2019   x2 MI's no stents   Thrombocytopenia (HCC)     Family History: Family History  Problem Relation Age of Onset   Cancer Mother    COPD Mother    Cancer Sister     Social History   Socioeconomic History   Marital status: Single    Spouse name: Not on file   Number of children: Not on file   Years of education: Not on file   Highest education level: Not on file  Occupational History   Not on file  Tobacco Use   Smoking status: Every Day    Packs/day: 1.00    Years: 50.00    Pack years: 50.00    Types: Cigarettes   Smokeless tobacco: Never   Tobacco comments:    pack last about 4 days now 01-12-2021  Vaping Use   Vaping Use: Never used  Substance and Sexual Activity   Alcohol use: Never   Drug use: Never   Sexual activity: Not on file  Other Topics Concern   Not on file  Social History Narrative   Alcohol/beer once/twice a month; 1/2 ppd; used to work in [employer]. In [place]; with sister.    Social Determinants of Health   Financial Resource Strain: Not on file  Food Insecurity: Not on file  Transportation Needs: Not on file  Physical Activity: Not on file  Stress: Not on file  Social Connections: Not on file  Intimate Partner Violence: Not on file      Review of Systems  Constitutional:  Negative for chills, fatigue and unexpected weight change.  HENT:  Negative for congestion, rhinorrhea, sneezing and sore throat.   Eyes:  Negative for redness.   Respiratory:  Negative for cough, chest tightness and shortness of breath.   Cardiovascular:  Negative for chest pain and palpitations.  Gastrointestinal:  Negative for abdominal pain, constipation, diarrhea, nausea and vomiting.  Genitourinary:  Negative for dysuria and frequency.  Musculoskeletal:  Negative for arthralgias, back pain, joint swelling and neck pain.  Skin:  Negative for rash.  Neurological: Negative.  Negative for tremors and numbness.  Hematological:  Negative for adenopathy. Does not bruise/bleed easily.  Psychiatric/Behavioral:  Negative for behavioral problems (Depression), sleep disturbance and suicidal ideas. The patient is not nervous/anxious.    Vital Signs: BP 136/86   Pulse 82   Temp 98.5 F (36.9 C)   Resp 16   Ht 5\' 6"  (1.676 m)  Wt 174 lb 9.6 oz (79.2 kg)   SpO2 95%   BMI 28.18 kg/m    Physical Exam Vitals reviewed.  Constitutional:      General: He is not in acute distress.    Appearance: Normal appearance. He is not ill-appearing.  HENT:     Head: Normocephalic and atraumatic.  Cardiovascular:     Rate and Rhythm: Normal rate and regular rhythm.     Pulses: Normal pulses.     Heart sounds: Normal heart sounds.  Pulmonary:     Effort: Pulmonary effort is normal.     Breath sounds: Normal breath sounds.  Skin:    General: Skin is warm and dry.     Capillary Refill: Capillary refill takes less than 2 seconds.  Neurological:     Mental Status: He is alert and oriented to person, place, and time.       Assessment/Plan: 1. Essential hypertension Blood pressure well controlled with lisinopril and hydrochlorothiazide. Refills ordered. ED resolved since stopping metoprolol. - hydrochlorothiazide (HYDRODIURIL) 25 MG tablet; Take 1 tablet (25 mg total) by mouth daily.  Dispense: 90 tablet; Refill: 1 - lisinopril (ZESTRIL) 10 MG tablet; Take 1 tablet (10 mg total) by mouth daily.  Dispense: 30 tablet; Refill: 2  2. Acquired  hypothyroidism Takes levothyroxine for hypothyroidism, refill sent.  - levothyroxine (SYNTHROID) 125 MCG tablet; Take 1 tablet (125 mcg total) by mouth daily.  Dispense: 90 tablet; Refill: 1  3. Mixed hyperlipidemia Elevated cholesterol treated with atorvastatin, refill ordered. - atorvastatin (LIPITOR) 10 MG tablet; Take 1 tablet (10 mg total) by mouth daily.  Dispense: 30 tablet; Refill: 5   General Counseling: nikita humble understanding of the findings of todays visit and agrees with plan of treatment. I have discussed any further diagnostic evaluation that may be needed or ordered today. We also reviewed his medications today. he has been encouraged to call the office with any questions or concerns that should arise related to todays visit.    No orders of the defined types were placed in this encounter.   Meds ordered this encounter  Medications   levothyroxine (SYNTHROID) 125 MCG tablet    Sig: Take 1 tablet (125 mcg total) by mouth daily.    Dispense:  90 tablet    Refill:  1   hydrochlorothiazide (HYDRODIURIL) 25 MG tablet    Sig: Take 1 tablet (25 mg total) by mouth daily.    Dispense:  90 tablet    Refill:  1   atorvastatin (LIPITOR) 10 MG tablet    Sig: Take 1 tablet (10 mg total) by mouth daily.    Dispense:  30 tablet    Refill:  5   lisinopril (ZESTRIL) 10 MG tablet    Sig: Take 1 tablet (10 mg total) by mouth daily.    Dispense:  30 tablet    Refill:  2   DISCONTD: HYDROcodone-acetaminophen (NORCO/VICODIN) 5-325 MG tablet    Sig: Take 1 tablet by mouth every 6 (six) hours as needed for moderate pain or severe pain.    Dispense:  20 tablet    Refill:  0    Return in about 3 months (around 07/25/2021) for anxiety med refill, Yula Crotwell PCP.   Total time spent:30 Minutes Time spent includes review of chart, medications, test results, and follow up plan with the patient.   [place] Controlled Substance Database was reviewed by me.  This patient was seen by Sallyanne Kuster, FNP-C in collaboration with Dr. Beverely Risen  as a part of collaborative care agreement.   Montgomery Rothlisberger R. Valetta Fuller, MSN, FNP-C Internal medicine

## 2021-04-24 NOTE — Telephone Encounter (Signed)
Patient with diagnosis of afib on Xarelto for anticoagulation.    Procedure: HARDWARE REMOVAL, RIGHT TIBIA IM NAIL REMOVAL   Date of procedure: 04/30/21  CHA2DS2-VASc Score = 2  This indicates a 2.2% annual risk of stroke. The patient's score is based upon: CHF History: No HTN History: Yes Diabetes History: No Stroke History: No Vascular Disease History: Yes Age Score: 0 Gender Score: 0    CrCl 63 ml/min Platelet count 106  Per office protocol, patient can hold Xarelto for 3 days prior to procedure.

## 2021-04-24 NOTE — Telephone Encounter (Signed)
   Primary Cardiologist: Debbe Odea, MD  Chart reviewed as part of pre-operative protocol coverage. Given past medical history and time since last visit, based on ACC/AHA guidelines, Gabriel Hoffman would be at acceptable risk for the planned procedure without further cardiovascular testing.   Patient with diagnosis of afib on Xarelto for anticoagulation.     Procedure: HARDWARE REMOVAL, RIGHT TIBIA IM NAIL REMOVAL   Date of procedure: 04/30/21   CHA2DS2-VASc Score = 2  This indicates a 2.2% annual risk of stroke. The patient's score is based upon: CHF History: No HTN History: Yes Diabetes History: No Stroke History: No Vascular Disease History: Yes Age Score: 0 Gender Score: 0    CrCl 63 ml/min Platelet count 106   Per office protocol, patient can hold Xarelto for 3 days prior to procedure.  I will route this recommendation to the requesting party via Epic fax function and remove from pre-op pool.  Please call with questions.  Thomasene Ripple. Jace Dowe NP-C    04/24/2021, 10:57 AM [facility] [address] Office [phone] Fax [phone]

## 2021-04-27 ENCOUNTER — Encounter
Admission: RE | Admit: 2021-04-27 | Discharge: 2021-04-27 | Disposition: A | Discharge status: Home / Self Care | Payer: Medicaid Other | Source: Ambulatory Visit | Attending: Orthopedic Surgery | Admitting: Orthopedic Surgery

## 2021-04-27 ENCOUNTER — Other Ambulatory Visit: Payer: Self-pay | Admitting: Internal Medicine

## 2021-04-27 ENCOUNTER — Encounter: Payer: Self-pay | Admitting: Orthopedic Surgery

## 2021-04-27 ENCOUNTER — Other Ambulatory Visit: Payer: Self-pay

## 2021-04-27 ENCOUNTER — Telehealth: Payer: Self-pay

## 2021-04-27 DIAGNOSIS — Z01812 Encounter for preprocedural laboratory examination: Secondary | ICD-10-CM | POA: Diagnosis present

## 2021-04-27 LAB — CBC
HCT: 43.6 % (ref 39.0–52.0)
Hemoglobin: 15.2 g/dL (ref 13.0–17.0)
MCH: 31.3 pg (ref 26.0–34.0)
MCHC: 34.9 g/dL (ref 30.0–36.0)
MCV: 89.9 fL (ref 80.0–100.0)
Platelets: 111 10*3/uL — ABNORMAL LOW (ref 150–400)
RBC: 4.85 MIL/uL (ref 4.22–5.81)
RDW: 13.4 % (ref 11.5–15.5)
WBC: 8.9 10*3/uL (ref 4.0–10.5)
nRBC: 0 % (ref 0.0–0.2)

## 2021-04-27 LAB — POTASSIUM: Potassium: 3.8 mmol/L (ref 3.5–5.1)

## 2021-04-27 MED ORDER — HYDROCODONE-ACETAMINOPHEN 5-325 MG PO TABS: 1.0000 | ORAL_TABLET | Freq: Four times a day (QID) | ORAL | 0 refills | Status: DC | PRN

## 2021-04-27 NOTE — Progress Notes (Signed)
Perioperative Services  Pre-Admission/Anesthesia Testing Clinical Review  Date: 04/27/21  Patient Demographics:  Name: Gabriel Hoffman DOB:   08-25-1956 MRN:   [id number]  Planned Surgical Procedure(s):    Case: [id number] Date/Time: 04/30/21 1349   Procedure: HARDWARE REMOVAL, Right tibia IM nail removal (Right)   Anesthesia type: Choice   Pre-op diagnosis:      Primary osteoarthritis of right knee M17.11     S/P ORIF Z98.890, Z87.81     Painful orthopaedic hardware T84.84XA   Location: [facility] OR ROOM 01 / [facility]   Surgeons: Kennedy Bucker, MD     NOTE: Available PAT nursing documentation and vital signs have been reviewed. Clinical nursing staff has updated patient's PMH/PSHx, current medication list, and drug allergies/intolerances to ensure comprehensive history available to assist in medical decision making as it pertains to the aforementioned surgical procedure and anticipated anesthetic course. Extensive review of available clinical information performed. Gabriel Hoffman PMH and PSHx updated with any diagnoses/procedures that  may have been inadvertently omitted during his intake with the pre-admission testing department's nursing staff.  Clinical Discussion:  Gabriel Hoffman is a 65 y.o. male who is submitted for pre-surgical anesthesia review and clearance prior to him undergoing the above procedure. Patient is a Current Smoker (50 pack year). Pertinent PMH includes: CAD MI, angina, atrial fibrillation, G1DD, aortic atherosclerosis, carotid artery stenosis, HTN, HLD, hypothyroidism, COPD, GERD (on daily PPI), thrombocytopenia, hemochromatosis, HCV (s/p Tx with Harvoni), cirrhosis, OA, anxiety (on BZO).   Patient is followed by cardiology Azucena Cecil, MD). He was last seen in the cardiology clinic on 02/24/2021; notes reviewed.  At the time of his last clinic visit, patient doing well overall from a cardiovascular perspective.  He denied any chest pain, shortness  of breath, PND, orthopnea, palpitations, significant peripheral edema, vertiginous symptoms, or presyncope/syncope.  Patient with a PMH significant for cardiovascular diagnoses.  Patient reportedly suffered an MI in 12/2018 while incarcerated.  Per cardiology notes, patient taken to a hospital in [place] where he was confined for 1 week; no diagnostic procedures were performed.  Patient reported that he was seen at a facility and [facility] in 07/2019 at which time he was diagnosed with atrial fibrillation and started on chronic anticoagulation therapy (rivaroxaban).  Patient underwent catheter ablation procedure.  TTE performed on 02/05/2020 revealed a normal left ventricular systolic function.  LVEF 55 to 60%.  Doppler parameters consistent with G1DD.  Left ventricular GLS -15.8%.  There was mild mitral valve regurgitation (see full interpretation of cardiovascular testing below).  Patient on chronic anticoagulation following catheter ablation procedure; CHA2DS2-VASc Score = 2 (HTN, vascular disease).  Rate and rhythm maintained on beta-blocker. Patient chronically anticoagulated using rivaroxaban; compliant with therapy with no evidence of GI bleeding. Blood pressure reasonably controlled at 130/72 on currently prescribed diuretic, beta-blocker, and ACEi therapies.  ECG in the office revealed normal sinus rhythm at a rate of 75 bpm.  Patient is on a statin for his HLD. Functional capacity, as defined by DASI, is documented as being >/= 4 METS. No changes were made to patient's medication regimen.  Patient to follow-up with outpatient cardiology in 12 months or sooner if needed.  Patient is scheduled for orthopedic hardware removal on 04/30/2021 with Dr. Kennedy Bucker.  Given patient's past medical history significant for cardiovascular diagnoses, presurgical cardiac clearance was sought by the PAT team. Per cardiology, "based on patient's past medical history and time since his last clinic visit,  patient would be at  an overall ACCEPTABLE risk for the planned procedure without further cardiovascular testing or intervention at this time".  Again, this patient is on daily anticoagulation therapy. He has been instructed on recommendations for holding his rivaroxaban for 3 days prior to his procedure with plans to restart as soon as postoperative bleeding risk felt to be minimized by his attending surgeon. The patient has been instructed that his last dose of his anticoagulant will be on 04/26/2021.  Patient reports previous perioperative complications with anesthesia in the past.  He advises that he experienced (+) seizure-like activity with propofol during a routine colonoscopy.  In review of the EMR, there are no available records for review pertaining to patient's past surgical/anesthetic courses within the [facility] system.  Vitals with BMI 04/24/2021 03/27/2021 02/24/2021  Height 5\' 6"  5\' 6"  5\' 5"   Weight 174 lbs 10 oz 181 lbs 180 lbs  BMI 28.19 29.23 29.95  Systolic 136 154  Diastolic 86 78 72  Pulse 82 73 75    Providers/Specialists:   NOTE: Primary physician provider listed below. Patient may have been seen by APP or partner within same practice.   PROVIDER ROLE / SPECIALTY LAST , MD  Orthopedics (Surgeon) 04/03/2021  154, MD  Primary Care Provider 04/24/2021  06/03/2021, MD  Cardiology 02/24/2021   Allergies:  Codeine and Propofol  Current Home Medications:   No current facility-administered medications for this encounter.    ALPRAZolam (XANAX) 0.5 MG tablet   Aspirin-Salicylamide-Caffeine (BC FAST PAIN RELIEF) 650-195-33.3 MG PACK   Hypromell-Glycerin-Naphazoline (CLEAR EYES FOR DRY EYES PLUS) 0.8-0.25-0.012 % SOLN   nitroGLYCERIN (NITROSTAT) 0.4 MG SL tablet   pantoprazole (PROTONIX) 20 MG tablet   rivaroxaban (XARELTO) 20 MG TABS tablet   traZODone (DESYREL) 50 MG tablet   atorvastatin (LIPITOR) 10 MG tablet    hydrochlorothiazide (HYDRODIURIL) 25 MG tablet   HYDROcodone-acetaminophen (NORCO/VICODIN) 5-325 MG tablet   levothyroxine (SYNTHROID) 125 MCG tablet   lisinopril (ZESTRIL) 10 MG tablet   History:   Past Medical History:  Diagnosis Date   Anginal pain (HCC)    Anxiety    Aortic atherosclerosis (HCC)    Arthritis    Atrial fibrillation (HCC)    CAD (coronary artery disease)    Carotid artery stenosis    Chronic anticoagulation    Rivaroxaban   Cirrhosis (HCC) 06/15/2020   Complication of anesthesia    seizure like activity with propofol during colonoscopy   COPD (chronic obstructive pulmonary disease) (HCC)    GERD (gastroesophageal reflux disease)    Grade I diastolic dysfunction    Hemochromatosis    Hepatitis C 06/15/2020   Tx'd with Harvoni course   HLD (hyperlipidemia)    Hypertension    Hypothyroidism    Lung nodule    Myocardial infarction (HCC) 2019   x2 MI's no stents   Thrombocytopenia (HCC)    Past Surgical History:  Procedure Laterality Date   CARDIAC CATHETERIZATION     CATARACT EXTRACTION     COLONOSCOPY     HERNIA REPAIR Bilateral    LEG SURGERY Right    rod in leg   WRIST SURGERY Left    Family History  Problem Relation Age of Onset   Cancer Mother    COPD Mother    Cancer Sister    Social History   Tobacco Use   Smoking status: Every Day    Packs/day: 1.00    Years: 50.00    Pack years: 50.00  Types: Cigarettes   Smokeless tobacco: Never   Tobacco comments:    pack last about 4 days now 01-12-2021  Vaping Use   Vaping Use: Never used  Substance Use Topics   Alcohol use: Never   Drug use: Never    Pertinent Clinical Results:  LABS: Labs reviewed: Acceptable for surgery.  Hospital Outpatient Visit on 04/27/2021  Component Date Value Ref Range Status   Potassium 04/27/2021 3.8  3.5 - 5.1 mmol/L Final   Performed at [facility], [address] [place], [place] [address]   WBC 04/27/2021 8.9  4.0 - 10.5 K/uL Final    RBC 04/27/2021 4.85  4.22 - 5.81 MIL/uL Final   Hemoglobin 04/27/2021 15.2  13.0 - 17.0 g/dL Final   HCT 18/84/1660 43.6  39.0 - 52.0 % Final   MCV 04/27/2021 89.9  80.0 - 100.0 fL Final   MCH 04/27/2021 31.3  26.0 - 34.0 pg Final   MCHC 04/27/2021 34.9  30.0 - 36.0 g/dL Final   RDW 63/11/6008 13.4  11.5 - 15.5 % Final   Platelets 04/27/2021 111 (A) 150 - 400 K/uL Final   Comment: Immature Platelet Fraction may be clinically indicated, consider ordering this additional test [id number]    nRBC 04/27/2021 0.0  0.0 - 0.2 % Final   Performed at [facility], [address] [place], [place] [address]      ECG: Date: 02/24/2021 Time ECG obtained: 0948 AM Rate: 75 bpm Rhythm: normal sinus Axis (leads I and aVF): Normal Intervals: PR 172 ms. QRS 74 ms. QTc 417 ms. ST segment and T wave changes: No evidence of acute ST segment elevation or depression Comparison: Similar to previous tracing obtained on 12/18/2020   IMAGING / PROCEDURES: CT CHEST LUNG CANCER SCREENING performed on 02/23/2021 Lung RADS 2, benign appearance or behavior.  Continue annual screening with low-dose chest CT without contrast in 12 months. Aortic atherosclerosis Emphysema Coronary artery atherosclerosis  TRANSTHORACIC ECHOCARDIOGRAM performed on 02/05/2020 LVEF 55 to 60% Left ventricular systolic function normal No regional wall motion abnormalities Left ventricular diastolic parameters consistent with grade 1 diastolic dysfunction (impaired relaxation) Average left ventricular global longitudinal strain is -15.8% Right ventricular systolic function normal Right ventricular cavity size normal Normal PASP Mitral valve is myxomatous.  Mild mitral valve regurgitation.  No evidence of mitral valve stenosis Aortic valve is tricuspid.  Aortic valve regurgitation is not visualized.  No evidence of aortic valve stenosis  BILATERAL CAROTID DOPPLER performed on 01/18/2020 Right carotid shows 50-60%  stenosis Left carotid shows <50% stenosis There is moderate plaque formation noted on the left and mild to moderate noted on the right Consider repeat carotid Doppler if clinical situation and symptoms warrant in 6 to 12 months. Patient should be encouraged to change lifestyle such as smoking cessation, regular exercise, and dietary modification.  Use of statins in the right clinical setting and ASA is encouraged.  Impression and Plan:  Gabriel Hoffman has been referred for pre-anesthesia review and clearance prior to him undergoing the planned anesthetic and procedural courses. Available labs, pertinent testing, and imaging results were personally reviewed by me. This patient has been appropriately cleared by cardiology with an overall ACCEPTABLE risk of significant perioperative cardiovascular complications.  Based on clinical review performed today (04/27/21), barring any significant acute changes in the patient's overall condition, it is anticipated that he will be able to proceed with the planned surgical intervention. Any acute changes in clinical condition may necessitate his procedure being postponed  and/or cancelled. Patient will meet with anesthesia team (MD and/or CRNA) on the day of his procedure for preoperative evaluation/assessment. Questions regarding anesthetic course will be fielded at that time.   Pre-surgical instructions were reviewed with the patient during his PAT appointment and questions were fielded by PAT clinical staff. Patient was advised that if any questions or concerns arise prior to his procedure then he should return a call to PAT and/or his surgeon's office to discuss.  Quentin Mulling, MSN, APRN, FNP-C, CEN [facility]  Peri-operative Services Nurse Practitioner Phone: [phone] Fax: [phone] 04/27/21 3:16 PM  NOTE: This note has been prepared using Dragon dictation software. Despite my best ability to proofread, there is always the  potential that unintentional transcriptional errors may still occur from this process.

## 2021-04-27 NOTE — Telephone Encounter (Signed)
Pt aware we send med 

## 2021-04-27 NOTE — Telephone Encounter (Signed)
Its taken care of

## 2021-04-30 ENCOUNTER — Other Ambulatory Visit: Payer: Self-pay

## 2021-04-30 ENCOUNTER — Encounter: Payer: Self-pay | Admitting: Orthopedic Surgery

## 2021-04-30 ENCOUNTER — Ambulatory Visit: Payer: Medicaid Other | Admitting: Urgent Care

## 2021-04-30 ENCOUNTER — Encounter
Admission: RE | Disposition: A | Discharge status: Home / Self Care | Payer: Self-pay | Source: Home / Self Care | Attending: Orthopedic Surgery

## 2021-04-30 ENCOUNTER — Ambulatory Visit: Payer: Medicaid Other

## 2021-04-30 ENCOUNTER — Ambulatory Visit
Admission: RE | Admit: 2021-04-30 | Discharge: 2021-04-30 | Disposition: A | Discharge status: Home / Self Care | Payer: Medicaid Other | Attending: Orthopedic Surgery | Admitting: Orthopedic Surgery

## 2021-04-30 DIAGNOSIS — I10 Essential (primary) hypertension: Secondary | ICD-10-CM | POA: Insufficient documentation

## 2021-04-30 DIAGNOSIS — Y793 Surgical instruments, materials and orthopedic devices (including sutures) associated with adverse incidents: Secondary | ICD-10-CM | POA: Diagnosis not present

## 2021-04-30 DIAGNOSIS — I252 Old myocardial infarction: Secondary | ICD-10-CM | POA: Insufficient documentation

## 2021-04-30 DIAGNOSIS — Z79890 Hormone replacement therapy (postmenopausal): Secondary | ICD-10-CM | POA: Insufficient documentation

## 2021-04-30 DIAGNOSIS — Z7901 Long term (current) use of anticoagulants: Secondary | ICD-10-CM | POA: Insufficient documentation

## 2021-04-30 DIAGNOSIS — F17210 Nicotine dependence, cigarettes, uncomplicated: Secondary | ICD-10-CM | POA: Insufficient documentation

## 2021-04-30 DIAGNOSIS — Z419 Encounter for procedure for purposes other than remedying health state, unspecified: Secondary | ICD-10-CM

## 2021-04-30 DIAGNOSIS — Z79899 Other long term (current) drug therapy: Secondary | ICD-10-CM | POA: Insufficient documentation

## 2021-04-30 DIAGNOSIS — M1711 Unilateral primary osteoarthritis, right knee: Secondary | ICD-10-CM | POA: Diagnosis present

## 2021-04-30 DIAGNOSIS — Z885 Allergy status to narcotic agent status: Secondary | ICD-10-CM | POA: Insufficient documentation

## 2021-04-30 DIAGNOSIS — F5101 Primary insomnia: Secondary | ICD-10-CM

## 2021-04-30 DIAGNOSIS — T8484XA Pain due to internal orthopedic prosthetic devices, implants and grafts, initial encounter: Secondary | ICD-10-CM | POA: Insufficient documentation

## 2021-04-30 HISTORY — DX: Other ill-defined heart diseases: I51.89

## 2021-04-30 HISTORY — DX: Atherosclerosis of aorta: I70.0

## 2021-04-30 HISTORY — DX: Atherosclerotic heart disease of native coronary artery without angina pectoris: I25.10

## 2021-04-30 HISTORY — DX: Hyperlipidemia, unspecified: E78.5

## 2021-04-30 HISTORY — DX: Long term (current) use of anticoagulants: Z79.01

## 2021-04-30 HISTORY — DX: Occlusion and stenosis of unspecified carotid artery: I65.29

## 2021-04-30 HISTORY — PX: HARDWARE REMOVAL: SHX979

## 2021-04-30 IMAGING — XA DG C-ARM 1-60 MIN
1 series · 1 of 1 positions shown · IV contrast (agent unspecified)
Comparison: Knee radiographs dated [DATE].

CLINICAL DATA: Hardware removal from the tibia and fibula.

EXAM:
DG C-ARM 1-60 MIN
CONTRAST:  None
FLUOROSCOPY TIME:  Fluoroscopy Time:  24 seconds
Radiation Exposure Index (if provided by the fluoroscopic device):
1.0 mGy
Number of Acquired Spot Images: 1

[Series 4: ortho standard · 1 of 1 slices shown]
[im 1/1]
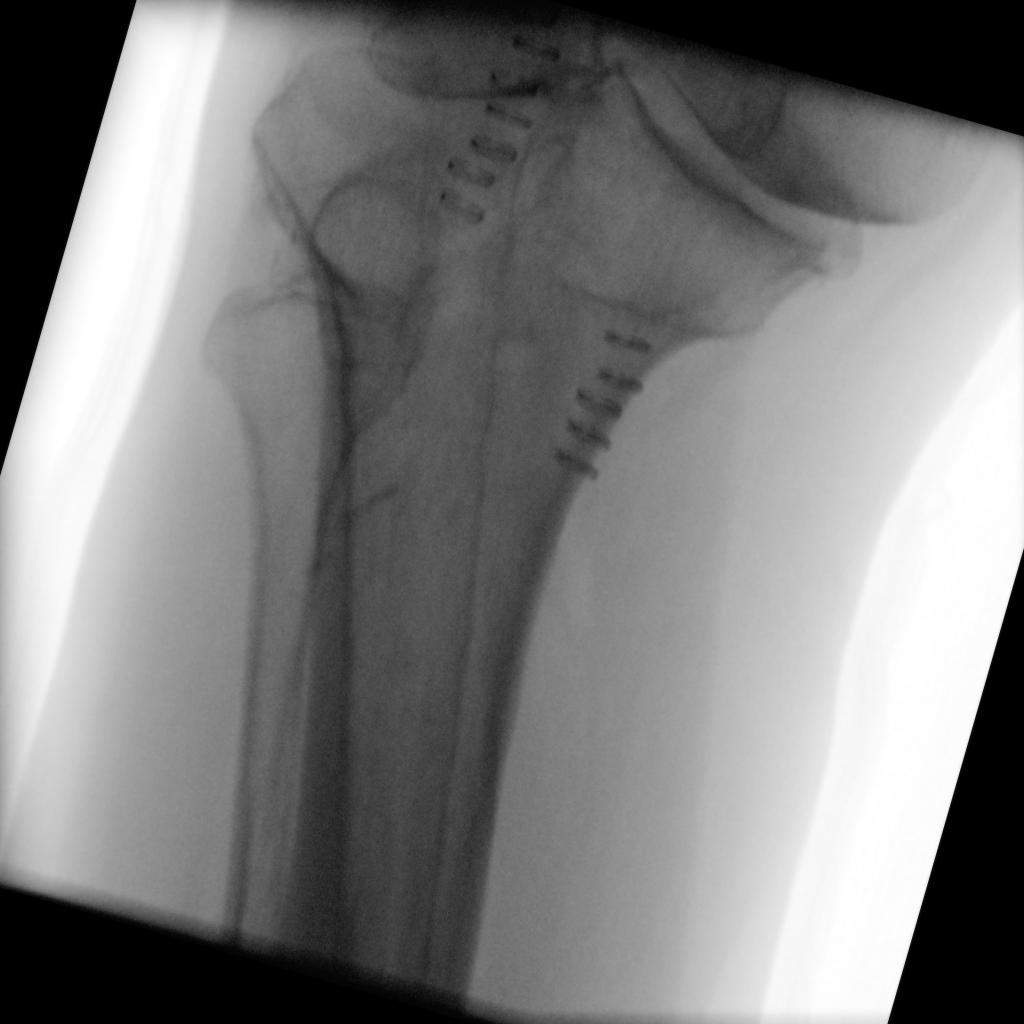

[1 of 1 positions shown; findings below may reference images not displayed]

FINDINGS: An intramedullary nail has been removed from the tibia. No acute
osseous injury is identified on this single view.
IMPRESSION: Interval removal of an intramedullary nail from the tibia.

## 2021-04-30 IMAGING — XA DG TIBIA/FIBULA 2V*R*
1 series · 1 of 1 positions shown · non-contrast
Comparison: Knee radiographs dated [DATE].

CLINICAL DATA: Status post hardware removal from the right tibia.

EXAM:
RIGHT TIBIA AND FIBULA - 2 VIEW

[Series 4: ortho standard · 1 of 1 slices shown]
[im 1/1]
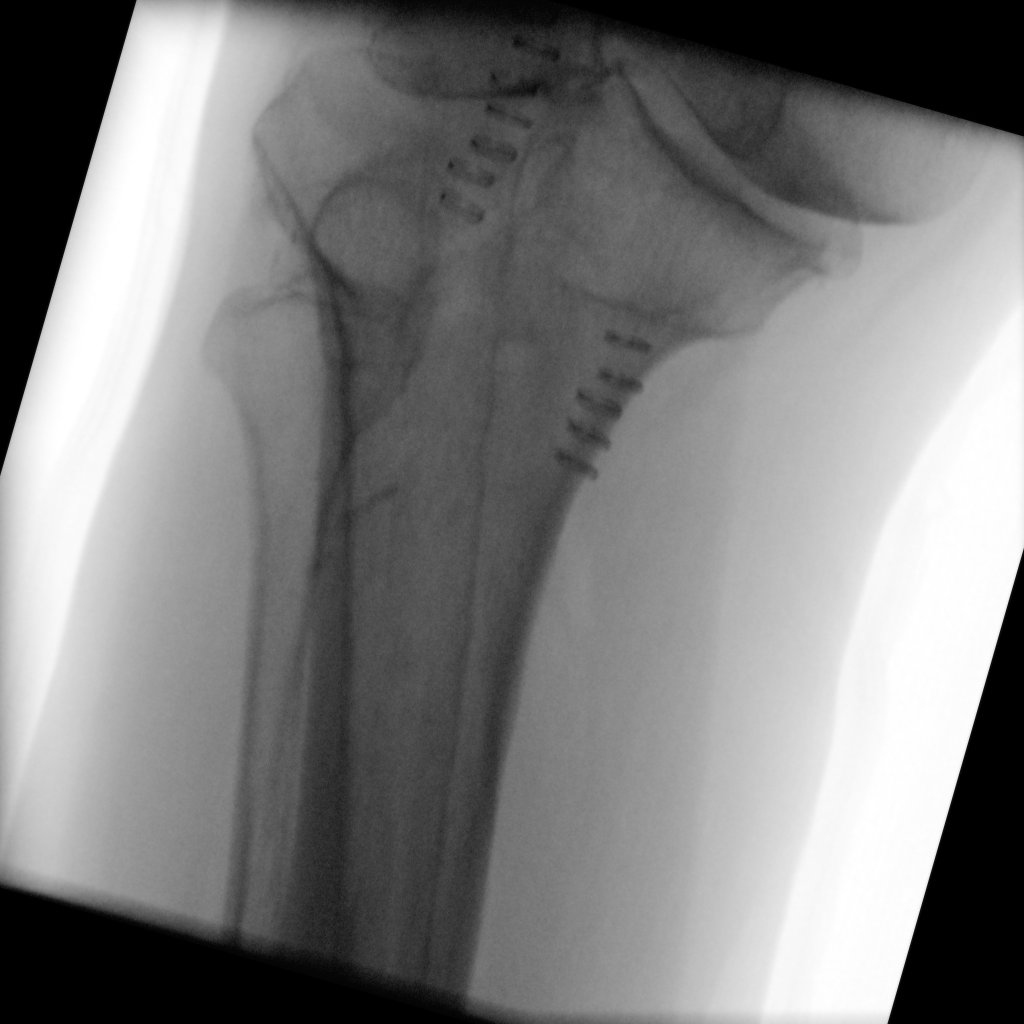

[1 of 1 positions shown; findings below may reference images not displayed]

FINDINGS: An intramedullary nail has been removed from the tibia. No acute
osseous injury is identified on this single view.
IMPRESSION: Interval removal of an intramedullary nail from the tibia.

## 2021-04-30 SURGERY — REMOVAL, HARDWARE
Anesthesia: General | Laterality: Right

## 2021-04-30 MED ORDER — PHENYLEPHRINE HCL (PRESSORS) 10 MG/ML IV SOLN
INTRAVENOUS | Status: DC | PRN
Start: 2021-04-30 — End: 2021-04-30
  Administered 2021-04-30 (×2): 100 ug via INTRAVENOUS

## 2021-04-30 MED ORDER — ETOMIDATE 2 MG/ML IV SOLN
INTRAVENOUS | Status: AC
  Filled 2021-04-30: qty 10

## 2021-04-30 MED ORDER — ONDANSETRON HCL 4 MG/2ML IJ SOLN: 4.0000 mg | Freq: Once | INTRAMUSCULAR | Status: DC | PRN

## 2021-04-30 MED ORDER — PHENYLEPHRINE HCL (PRESSORS) 10 MG/ML IV SOLN
INTRAVENOUS | Status: AC
  Filled 2021-04-30: qty 1

## 2021-04-30 MED ORDER — ONDANSETRON HCL 4 MG/2ML IJ SOLN
INTRAMUSCULAR | Status: AC
  Filled 2021-04-30: qty 2

## 2021-04-30 MED ORDER — OXYCODONE HCL ER 10 MG PO T12A
EXTENDED_RELEASE_TABLET | ORAL | Status: AC
  Filled 2021-04-30: qty 1

## 2021-04-30 MED ORDER — SEVOFLURANE IN SOLN
RESPIRATORY_TRACT | Status: AC
  Filled 2021-04-30: qty 250

## 2021-04-30 MED ORDER — PROPOFOL 10 MG/ML IV BOLUS
INTRAVENOUS | Status: AC
  Filled 2021-04-30: qty 20

## 2021-04-30 MED ORDER — RIVAROXABAN 20 MG PO TABS: 20.0000 mg | ORAL_TABLET | Freq: Every morning | ORAL | Status: DC

## 2021-04-30 MED ORDER — FENTANYL CITRATE (PF) 100 MCG/2ML IJ SOLN
INTRAMUSCULAR | Status: AC
  Filled 2021-04-30: qty 2

## 2021-04-30 MED ORDER — DEXAMETHASONE SODIUM PHOSPHATE 10 MG/ML IJ SOLN
INTRAMUSCULAR | Status: DC | PRN
  Administered 2021-04-30: 10 mg via INTRAVENOUS

## 2021-04-30 MED ORDER — OXYCODONE HCL 5 MG PO TABS
ORAL_TABLET | ORAL | Status: AC
  Filled 2021-04-30: qty 2

## 2021-04-30 MED ORDER — FENTANYL CITRATE (PF) 100 MCG/2ML IJ SOLN
INTRAMUSCULAR | Status: DC | PRN
  Administered 2021-04-30: 50 ug via INTRAVENOUS

## 2021-04-30 MED ORDER — FENTANYL CITRATE (PF) 100 MCG/2ML IJ SOLN
INTRAMUSCULAR | Status: AC
  Administered 2021-04-30: 50 ug via INTRAVENOUS
  Filled 2021-04-30: qty 2

## 2021-04-30 MED ORDER — OXYCODONE HCL 5 MG PO TABS
10.0000 mg | ORAL_TABLET | Freq: Once | ORAL | Status: AC
  Administered 2021-04-30: 10 mg via ORAL

## 2021-04-30 MED ORDER — CEFAZOLIN SODIUM-DEXTROSE 2-4 GM/100ML-% IV SOLN
INTRAVENOUS | Status: AC
  Filled 2021-04-30: qty 100

## 2021-04-30 MED ORDER — HYDROCODONE-ACETAMINOPHEN 5-325 MG PO TABS: 1.0000 | ORAL_TABLET | ORAL | 0 refills | Status: DC | PRN

## 2021-04-30 MED ORDER — DEXMEDETOMIDINE HCL 200 MCG/2ML IV SOLN
INTRAVENOUS | Status: DC | PRN
  Administered 2021-04-30 (×2): 20 ug via INTRAVENOUS

## 2021-04-30 MED ORDER — GLYCOPYRROLATE 0.2 MG/ML IJ SOLN
INTRAMUSCULAR | Status: DC | PRN
  Administered 2021-04-30: .2 mg via INTRAVENOUS

## 2021-04-30 MED ORDER — PANTOPRAZOLE SODIUM 20 MG PO TBEC
20.0000 mg | DELAYED_RELEASE_TABLET | Freq: Every morning | ORAL | Status: DC
Start: 2021-04-30 — End: 2021-05-20

## 2021-04-30 MED ORDER — DEXAMETHASONE SODIUM PHOSPHATE 10 MG/ML IJ SOLN
INTRAMUSCULAR | Status: AC
  Filled 2021-04-30: qty 1

## 2021-04-30 MED ORDER — ONDANSETRON HCL 4 MG/2ML IJ SOLN
INTRAMUSCULAR | Status: DC | PRN
  Administered 2021-04-30: 4 mg via INTRAVENOUS

## 2021-04-30 MED ORDER — CHLORHEXIDINE GLUCONATE 0.12 % MT SOLN
OROMUCOSAL | Status: AC
  Administered 2021-04-30: 15 mL via OROMUCOSAL
  Filled 2021-04-30: qty 15

## 2021-04-30 MED ORDER — CEFAZOLIN SODIUM-DEXTROSE 2-4 GM/100ML-% IV SOLN
2.0000 g | INTRAVENOUS | Status: AC
  Administered 2021-04-30: 2 g via INTRAVENOUS

## 2021-04-30 MED ORDER — ETOMIDATE 2 MG/ML IV SOLN
INTRAVENOUS | Status: DC | PRN
  Administered 2021-04-30: 14 mg via INTRAVENOUS

## 2021-04-30 MED ORDER — OXYCODONE HCL 5 MG PO TABS: 5.0000 mg | ORAL_TABLET | Freq: Three times a day (TID) | ORAL | 0 refills | Status: DC | PRN

## 2021-04-30 MED ORDER — LACTATED RINGERS IV SOLN: INTRAVENOUS | Status: DC

## 2021-04-30 MED ORDER — 0.9 % SODIUM CHLORIDE (POUR BTL) OPTIME
TOPICAL | Status: DC | PRN
  Administered 2021-04-30: 1000 mL

## 2021-04-30 MED ORDER — LACTATED RINGERS IV SOLN: INTRAVENOUS | Status: DC | PRN

## 2021-04-30 MED ORDER — CHLORHEXIDINE GLUCONATE 0.12 % MT SOLN: 15.0000 mL | Freq: Once | OROMUCOSAL | Status: AC

## 2021-04-30 MED ORDER — DEXMEDETOMIDINE (PRECEDEX) IN NS 20 MCG/5ML (4 MCG/ML) IV SYRINGE
PREFILLED_SYRINGE | INTRAVENOUS | Status: AC
  Filled 2021-04-30: qty 5

## 2021-04-30 MED ORDER — MIDAZOLAM HCL 2 MG/2ML IJ SOLN
INTRAMUSCULAR | Status: AC
  Filled 2021-04-30: qty 2

## 2021-04-30 MED ORDER — LIDOCAINE HCL (CARDIAC) PF 100 MG/5ML IV SOSY
PREFILLED_SYRINGE | INTRAVENOUS | Status: DC | PRN
  Administered 2021-04-30: 100 mg via INTRAVENOUS

## 2021-04-30 MED ORDER — ORAL CARE MOUTH RINSE: 15.0000 mL | Freq: Once | OROMUCOSAL | Status: AC

## 2021-04-30 MED ORDER — FENTANYL CITRATE (PF) 100 MCG/2ML IJ SOLN
25.0000 ug | INTRAMUSCULAR | Status: DC | PRN
  Administered 2021-04-30 (×2): 50 ug via INTRAVENOUS

## 2021-04-30 MED ORDER — MIDAZOLAM HCL 2 MG/2ML IJ SOLN
INTRAMUSCULAR | Status: DC | PRN
  Administered 2021-04-30: 2 mg via INTRAVENOUS

## 2021-04-30 MED ORDER — TRAZODONE HCL 50 MG PO TABS: 50.0000 mg | ORAL_TABLET | Freq: Every evening | ORAL | Status: DC | PRN

## 2021-04-30 SURGICAL SUPPLY — 39 items
APL PRP STRL LF DISP 70% ISPRP (MISCELLANEOUS) ×1
CANISTER SUCT 1200ML W/VALVE (MISCELLANEOUS) ×2 IMPLANT
CHLORAPREP W/TINT 26 (MISCELLANEOUS) ×2 IMPLANT
CUFF TOURN SGL QUICK 24 (TOURNIQUET CUFF)
CUFF TOURN SGL QUICK 34 (TOURNIQUET CUFF)
CUFF TRNQT CYL 24X4X16.5-23 (TOURNIQUET CUFF) IMPLANT
CUFF TRNQT CYL 34X4.125X (TOURNIQUET CUFF) IMPLANT
DRAPE C-ARM XRAY 36X54 (DRAPES) ×2 IMPLANT
DRAPE INCISE IOBAN 66X45 STRL (DRAPES) ×2 IMPLANT
DRSG EMULSION OIL 3X8 NADH (GAUZE/BANDAGES/DRESSINGS) ×2 IMPLANT
ELECT CAUTERY BLADE 6.4 (BLADE) ×2 IMPLANT
ELECT REM PT RETURN 9FT ADLT (ELECTROSURGICAL) ×2
ELECTRODE REM PT RTRN 9FT ADLT (ELECTROSURGICAL) ×1 IMPLANT
GAUZE 4X4 16PLY ~~LOC~~+RFID DBL (SPONGE) ×2 IMPLANT
GAUZE SPONGE 4X4 12PLY STRL (GAUZE/BANDAGES/DRESSINGS) ×2 IMPLANT
GAUZE XEROFORM 1X8 LF (GAUZE/BANDAGES/DRESSINGS) ×2 IMPLANT
GLOVE SURG SYN 9.0  PF PI (GLOVE) ×1
GLOVE SURG SYN 9.0 PF PI (GLOVE) ×1 IMPLANT
GOWN SRG 2XL LVL 4 RGLN SLV (GOWNS) ×1 IMPLANT
GOWN STRL NON-REIN 2XL LVL4 (GOWNS) ×2
GOWN STRL REUS W/ TWL LRG LVL3 (GOWN DISPOSABLE) ×1 IMPLANT
GOWN STRL REUS W/TWL LRG LVL3 (GOWN DISPOSABLE) ×2
KIT TURNOVER KIT A (KITS) ×2 IMPLANT
MANIFOLD NEPTUNE II (INSTRUMENTS) ×2 IMPLANT
NEEDLE FILTER BLUNT 18X 1/2SAF (NEEDLE) ×1
NEEDLE FILTER BLUNT 18X1 1/2 (NEEDLE) ×1 IMPLANT
NS IRRIG 1000ML POUR BTL (IV SOLUTION) ×2 IMPLANT
PACK EXTREMITY ARMC (MISCELLANEOUS) ×2 IMPLANT
PAD ABD DERMACEA PRESS 5X9 (GAUZE/BANDAGES/DRESSINGS) ×4 IMPLANT
SCALPEL PROTECTED #15 DISP (BLADE) ×4 IMPLANT
STAPLER SKIN PROX 35W (STAPLE) ×2 IMPLANT
SUT ETHIBOND NAB CT1 #1 30IN (SUTURE) ×2 IMPLANT
SUT ETHILON 3-0 FS-10 30 BLK (SUTURE) ×2
SUT VIC AB 0 CT1 36 (SUTURE) ×2 IMPLANT
SUT VIC AB 2-0 CT1 27 (SUTURE) ×2
SUT VIC AB 2-0 CT1 TAPERPNT 27 (SUTURE) ×1 IMPLANT
SUTURE EHLN 3-0 FS-10 30 BLK (SUTURE) ×1 IMPLANT
SYR 10ML LL (SYRINGE) ×2 IMPLANT
WATER STERILE IRR 1000ML POUR (IV SOLUTION) ×2 IMPLANT

## 2021-04-30 NOTE — Op Note (Signed)
04/30/2021  3:59 PM  PATIENT:  Gabriel Hoffman  65 y.o. male  PRE-OPERATIVE DIAGNOSIS:  Primary osteoarthritis of right knee M17.11 S/P ORIF Z98.890, Z87.81 Painful orthopaedic hardware T84.84XA  POST-OPERATIVE DIAGNOSIS:  Primary osteoarthritis of right knee M17.11 S/P ORIF Z98.890, Z87.81 P  PROCEDURE:  Procedure(s): HARDWARE REMOVAL, Right tibia IM nail removal (Right)  SURGEON: Leitha Schuller, MD  ASSISTANTS: None  ANESTHESIA:   general  EBL:  Total I/O In: 600 [I.V.:500; IV Piggyback:100] Out: 50 [Blood:50]  BLOOD ADMINISTERED:none  DRAINS: none   LOCAL MEDICATIONS USED:  NONE  SPECIMEN:  No Specimen  DISPOSITION OF SPECIMEN:  N/A  COUNTS:  YES  TOURNIQUET:   Total Tourniquet Time Documented: Thigh (Right) - 27 minutes Total: Thigh (Right) - 27 minutes   IMPLANTS: None  DICTATION: .Dragon Dictation patient was brought to the operating room and after adequate general anesthesia was obtained the right leg was prepped and draped in the usual sterile fashion with a tourniquet applied to the upper thigh.  After patient identification and timeout procedures were completed the distal skin with a tourniquet was raised and distal screws were approached through small incisions exposing the head of the screw and removing them without difficulty.  Going more proximally third incision was made for the proximal screw it was exposed and partially removed following this the knee was brought up into extreme flexion and going through the prior scar direct anterior splitting the patellar tendon the rod was visualized the end cap was removed without difficulty and the extraction bolt placed following this the proximal interlocking screw was removed and using a slap hammer the rod came out without difficulty.  The wound was then thoroughly irrigated with the patellar tendon closed with 0 Vicryl followed by 2-0 Vicryl subcutaneously and skin staples with a small incisions for the screw  removal closed with 2-0 Vicryl subcutaneously and skin staples.  Xeroform 4 x 4's ABD web roll and Ace wrap were applied tourniquet let down to close the case.  PLAN OF CARE: Discharge to home after PACU  PATIENT DISPOSITION:  PACU - hemodynamically stable.

## 2021-04-30 NOTE — Anesthesia Preprocedure Evaluation (Signed)
Anesthesia Evaluation  Patient identified by MRN, date of birth, ID band Patient awake    Reviewed: Allergy & Precautions, H&P , NPO status , Patient's Chart, lab work & pertinent test results, reviewed documented beta blocker date and time   History of Anesthesia Complications (+) history of anesthetic complications  Airway Mallampati: II  TM Distance: >3 FB Neck ROM: full    Dental  (+) Dental Advidsory Given, Upper Dentures, Lower Dentures, Edentulous Upper, Edentulous Lower   Pulmonary shortness of breath, neg sleep apnea, COPD, neg recent URI, Current Smoker,    Pulmonary exam normal breath sounds clear to auscultation       Cardiovascular Exercise Tolerance: Good hypertension, (-) angina+ CAD and + Past MI  (-) Cardiac Stents and (-) CABG + dysrhythmias Atrial Fibrillation (-) Valvular Problems/Murmurs Rhythm:regular Rate:Normal     Neuro/Psych PSYCHIATRIC DISORDERS Anxiety negative neurological ROS     GI/Hepatic GERD  ,(+) Cirrhosis       , Hepatitis -, C  Endo/Other  neg diabetesHypothyroidism   Renal/GU negative Renal ROS  negative genitourinary   Musculoskeletal   Abdominal   Peds  Hematology negative hematology ROS (+)   Anesthesia Other Findings Past Medical History: No date: Anginal pain (HCC) No date: Anxiety No date: Aortic atherosclerosis (HCC) No date: Arthritis No date: Atrial fibrillation (HCC) No date: CAD (coronary artery disease) No date: Carotid artery stenosis No date: Chronic anticoagulation     Comment:  Rivaroxaban 06/15/2020: Cirrhosis (HCC) No date: Complication of anesthesia     Comment:  seizure like activity with propofol during colonoscopy No date: COPD (chronic obstructive pulmonary disease) (HCC) No date: GERD (gastroesophageal reflux disease) No date: Grade I diastolic dysfunction No date: Hemochromatosis 06/15/2020: Hepatitis C     Comment:  Tx'd with Harvoni  course No date: HLD (hyperlipidemia) No date: Hypertension No date: Hypothyroidism No date: Lung nodule 2019: Myocardial infarction (HCC)     Comment:  x2 MI's no stents No date: Thrombocytopenia (HCC)   Reproductive/Obstetrics negative OB ROS                             Anesthesia Physical Anesthesia Plan  ASA: 3  Anesthesia Plan: General   Post-op Pain Management:    Induction: Intravenous  PONV Risk Score and Plan: 1 and Ondansetron, Dexamethasone, Midazolam and Treatment may vary due to age or medical condition  Airway Management Planned: LMA and Oral ETT  Additional Equipment:   Intra-op Plan:   Post-operative Plan: Extubation in OR  Informed Consent: I have reviewed the patients History and Physical, chart, labs and discussed the procedure including the risks, benefits and alternatives for the proposed anesthesia with the patient or authorized representative who has indicated his/her understanding and acceptance.     Dental Advisory Given  Plan Discussed with: Anesthesiologist, CRNA and Surgeon  Anesthesia Plan Comments:         Anesthesia Quick Evaluation

## 2021-04-30 NOTE — Discharge Instructions (Addendum)
Weightbearing as tolerated to right leg. Keep dressing clean and dry until return visit. Resume all regular medications including Xarelto in a.m. Call office if you are having problems Pain medicine as directedAMBULATORY SURGERY  DISCHARGE INSTRUCTIONS   The drugs that you were given will stay in your system until tomorrow so for the next 24 hours you should not:  Drive an automobile Make any legal decisions Drink any alcoholic beverage   You may resume regular meals tomorrow.  Today it is better to start with liquids and gradually work up to solid foods.  You may eat anything you prefer, but it is better to start with liquids, then soup and crackers, and gradually work up to solid foods.   Please notify your doctor immediately if you have any unusual bleeding, trouble breathing, redness and pain at the surgery site, drainage, fever, or pain not relieved by medication.     Your post-operative visit with Dr.                                       is: Date:                        Time:    Please call to schedule your post-operative visit.  Additional Instructions:

## 2021-04-30 NOTE — Transfer of Care (Signed)
Immediate Anesthesia Transfer of Care Note  Patient: Gabriel Hoffman  Procedure(s) Performed: HARDWARE REMOVAL, Right tibia IM nail removal (Right)  Patient Location: PACU  Anesthesia Type: general  Level of Consciousness: sedated  Airway & Oxygen Therapy: Patient Spontanous Breathing and Patient connected to nasal cannula oxygen  Post-op Assessment: Report given to RN and Post -op Vital signs reviewed and stable  Post vital signs: Reviewed and stable  Last Vitals:  Vitals Value Taken Time  BP 150/95 04/30/21 1608  Temp    Pulse 100 04/30/21 1610  Resp 17 04/30/21 1610  SpO2 98 % 04/30/21 1610  Vitals shown include unvalidated device data.  Last Pain:  Vitals:   04/30/21 1210  TempSrc: Oral  PainSc: 0-No pain         Complications: No notable events documented.

## 2021-04-30 NOTE — Anesthesia Postprocedure Evaluation (Signed)
Anesthesia Post Note  Patient: Gabriel Hoffman  Procedure(s) Performed: HARDWARE REMOVAL, Right tibia IM nail removal (Right)  Patient location during evaluation: PACU Anesthesia Type: General Level of consciousness: awake and alert Pain management: pain level controlled Vital Signs Assessment: post-procedure vital signs reviewed and stable Respiratory status: spontaneous breathing, nonlabored ventilation, respiratory function stable and patient connected to nasal cannula oxygen Cardiovascular status: blood pressure returned to baseline and stable Postop Assessment: no apparent nausea or vomiting Anesthetic complications: no   No notable events documented.   Last Vitals:  Vitals:   04/30/21 1645 04/30/21 1710  BP: (!) 154/82   Pulse: 95 (!) 109  Resp: 15 18  Temp: (!) 36.4 C (!) 36.2 C  SpO2: 94% 96%    Last Pain:  Vitals:   04/30/21 1710  TempSrc: Temporal  PainSc: 4                  Lenard Simmer

## 2021-04-30 NOTE — H&P (Signed)
Chief Complaint: Chief Complaint  Patient presents with   Right knee pain   Gabriel Hoffman is a 65 y.o. male who presents today for evaluation of right knee pain. Patient underwent IM nailing for tibia fracture in 2009. Over the last couple of months has been having pain in the right knee along with swelling. He has some pain along the mid tibia but mostly along the patellar region and medial joint line with weightbearing activity. He denies any recent trauma or injury. He is unable to take NSAIDs. He is on Xarelto. Pain is moderate to severe and limiting his activities of daily living.  Past Medical History: No past medical history on file.  Past Surgical History: No past surgical history on file.  Past Family History: No family history on file.  Medications: Current Outpatient Medications Ordered in Epic  Medication Sig Dispense Refill   ALPRAZolam (XANAX) 0.5 MG tablet Take by mouth   atorvastatin (LIPITOR) 10 MG tablet Take 1 tablet by mouth once daily   hydroCHLOROthiazide (HYDRODIURIL) 25 MG tablet Take 1 tablet by mouth once daily   levothyroxine (SYNTHROID) 125 MCG tablet Take 1 tablet by mouth once daily   lisinopriL (ZESTRIL) 10 MG tablet Take 1 tablet by mouth once daily   pantoprazole (PROTONIX) 20 MG DR tablet Take 20 mg by mouth once daily   rivaroxaban (XARELTO) 20 mg tablet Take by mouth   traZODone (DESYREL) 50 MG tablet Take by mouth   No current Epic-ordered facility-administered medications on file.   Allergies: No Known Allergies   Review of Systems:  A comprehensive 14 point ROS was performed, reviewed by me today, and the pertinent orthopaedic findings are documented in the HPI.  Exam: BP 104/64  Wt 81.6 kg (180 lb)  General:  Well developed, well nourished, no apparent distress, normal affect, antalgic gait   HEENT: Head normocephalic, atraumatic, PERRL.   Abdomen: Soft, non tender, non distended, Bowel sounds present.  Heart: Examination of  the heart reveals regular, rate, and rhythm. There is no murmur noted on ascultation. There is a normal apical pulse.  Lungs: Lungs are clear to auscultation. There is no wheeze, rhonchi, or crackles. There is normal expansion of bilateral chest walls.   Right lower extremity: Examination right lower extremity shows patient has no swelling warmth erythema or effusion to the right knee. He has 0 to 95 degrees range of motion. Hardware is palpable with the knee in flexion along the infrapatellar region. No skin breakdown noted. Knee is stable to valgus varus stress testing. He does have some mild tenderness along medial joint line. Patient has full range of motion of the right hip with internal and external rotation with no discomfort.  Imaging: AP lateral sunrise views of the right knee are ordered and interpreted by me in the office today. Impression: Patient has slight varus deformity. Complete loss of joint space in the medial compartment with sclerotic changes noted. There is intramedullary rod along the tibia with superior extrusion. Mild patellofemoral osteoarthritis with spurring noted. Patella tracks well the trochlear groove.  AP and lateral views of the right tibia and fibula are ordered interpreted by me in the office today. Impression: Patient has adequate joint space along the tibiotalar joint with complete union of a spiral tibial shaft fracture. IM nail is intact with 2 distal antirotation screws present. Proximal antirotation screw is intact. There is superior migration of the IM rod that appears to be above the joint line  Impression: Primary osteoarthritis of  right knee [M17.11] Primary osteoarthritis of right knee (primary encounter diagnosis) History of right tibia IM nailing 2009 Painful orthopaedic hardware (CMS-HCC)  Plan:  39. 65 year old male status post right tibia IM nailing in 2009. Hardware has migrated superiorly and is higher than the joint line. Patient's been having  pain for 2 months. Risks, benefits, complications of a right tibia intramedullary rod removal have been discussed with patient. Patient has agreed and consented procedure with Dr. Rosita Kea. Patient understands once hardware has been removed at some point he may need total knee arthroplasty.  Notes reviewed from hospital system show patient underwent IM nailing on 07/09/2008. Synthes 10 mm x 345 mm solid titanium tibial nail with a cross locking screw of 30 mm and 38 mm and 40 mm with a 0 end cap  This note was generated in part with voice recognition software and I apologize for any typographical errors that were not detected and corrected.  Patience Musca MPA-C   Electronically signed by Patience Musca, PA at 04/06/2021 9:39 AM EDT  Reviewed  H+P. No changes noted.

## 2021-05-01 ENCOUNTER — Encounter: Payer: Self-pay | Admitting: Orthopedic Surgery

## 2021-05-04 ENCOUNTER — Other Ambulatory Visit: Payer: Self-pay | Admitting: Internal Medicine

## 2021-05-04 DIAGNOSIS — I10 Essential (primary) hypertension: Secondary | ICD-10-CM

## 2021-05-04 DIAGNOSIS — E782 Mixed hyperlipidemia: Secondary | ICD-10-CM

## 2021-05-04 DIAGNOSIS — E039 Hypothyroidism, unspecified: Secondary | ICD-10-CM

## 2021-05-04 MED ORDER — HYDROCHLOROTHIAZIDE 25 MG PO TABS: 25.0000 mg | ORAL_TABLET | Freq: Every day | ORAL | 1 refills | Status: DC

## 2021-05-04 MED ORDER — LISINOPRIL 10 MG PO TABS: 10.0000 mg | ORAL_TABLET | Freq: Every day | ORAL | 1 refills | Status: DC

## 2021-05-04 MED ORDER — ATORVASTATIN CALCIUM 10 MG PO TABS: 10.0000 mg | ORAL_TABLET | Freq: Every day | ORAL | 1 refills | Status: DC

## 2021-05-04 MED ORDER — LEVOTHYROXINE SODIUM 125 MCG PO TABS: 125.0000 ug | ORAL_TABLET | Freq: Every day | ORAL | 1 refills | Status: DC

## 2021-05-20 ENCOUNTER — Other Ambulatory Visit: Payer: Self-pay | Admitting: Internal Medicine

## 2021-06-18 ENCOUNTER — Other Ambulatory Visit: Payer: Self-pay

## 2021-06-18 ENCOUNTER — Ambulatory Visit: Payer: Medicaid Other | Admitting: Physician Assistant

## 2021-06-18 ENCOUNTER — Encounter: Payer: Self-pay | Admitting: Physician Assistant

## 2021-06-18 DIAGNOSIS — I480 Paroxysmal atrial fibrillation: Secondary | ICD-10-CM | POA: Diagnosis not present

## 2021-06-18 DIAGNOSIS — F5101 Primary insomnia: Secondary | ICD-10-CM

## 2021-06-18 DIAGNOSIS — E782 Mixed hyperlipidemia: Secondary | ICD-10-CM

## 2021-06-18 DIAGNOSIS — I10 Essential (primary) hypertension: Secondary | ICD-10-CM | POA: Diagnosis not present

## 2021-06-18 DIAGNOSIS — E039 Hypothyroidism, unspecified: Secondary | ICD-10-CM

## 2021-06-18 MED ORDER — ALPRAZOLAM 0.5 MG PO TABS
0.5000 mg | ORAL_TABLET | Freq: Every evening | ORAL | 2 refills | Status: DC | PRN
Start: 2021-06-18 — End: 2021-09-17

## 2021-06-18 MED ORDER — ZOSTER VAC RECOMB ADJUVANTED 50 MCG/0.5ML IM SUSR: 0.5000 mL | Freq: Once | INTRAMUSCULAR | 0 refills | Status: AC

## 2021-06-18 MED ORDER — PNEUMOCOCCAL 20-VAL CONJ VACC 0.5 ML IM SUSY: 0.5000 mL | PREFILLED_SYRINGE | INTRAMUSCULAR | 0 refills | Status: AC

## 2021-06-18 MED ORDER — TETANUS-DIPHTH-ACELL PERTUSSIS 5-2.5-18.5 LF-MCG/0.5 IM SUSP: 0.5000 mL | Freq: Once | INTRAMUSCULAR | 0 refills | Status: AC

## 2021-06-18 NOTE — Progress Notes (Signed)
[facility] [address] [place], [place] [address]  Internal MEDICINE  Office Visit Note  Patient Name: Gabriel Hoffman  [id number]  [id number]  Date of Service: 06/18/2021  Chief Complaint  Patient presents with   Follow-up    Xanax refills    Hyperlipidemia   Hypertension    HPI Pt is here for routine follow up for med refills and has no complaints today -Had a rod taken out of right leg and is doing ok. He follows up with ortho Sept 9th and they are planning to discuss a right knee replacement now. -BP at home 130/70s, has been taking HCTZ and lisinopril and tolerates these well -Pt has afib and is taking his xarelto and has f/u with cardiology in October. He denies any symptoms -he is due for PNA, shingles, and tdap vaccinations  Current Medication: Outpatient Encounter Medications as of 06/18/2021  Medication Sig   atorvastatin (LIPITOR) 10 MG tablet Take 1 tablet (10 mg total) by mouth daily.   hydrochlorothiazide (HYDRODIURIL) 25 MG tablet Take 1 tablet (25 mg total) by mouth daily.   HYDROcodone-acetaminophen (NORCO/VICODIN) 5-325 MG tablet Take 1 tablet by mouth every 4 (four) hours as needed for moderate pain or severe pain.   Hypromell-Glycerin-Naphazoline (CLEAR EYES FOR DRY EYES PLUS) 0.8-0.25-0.012 % SOLN Place 1-2 drops into both eyes 3 (three) times daily as needed (dry/irritated eyes).   levothyroxine (SYNTHROID) 125 MCG tablet Take 1 tablet (125 mcg total) by mouth daily.   lisinopril (ZESTRIL) 10 MG tablet Take 1 tablet (10 mg total) by mouth daily.   nitroGLYCERIN (NITROSTAT) 0.4 MG SL tablet Place 0.4 mg under the tongue every 5 (five) minutes x 3 doses as needed for chest pain.   pantoprazole (PROTONIX) 20 MG tablet Take 1 tablet by mouth once daily   rivaroxaban (XARELTO) 20 MG TABS tablet Take 1 tablet (20 mg total) by mouth in the morning.   traZODone (DESYREL) 50 MG tablet Take 1 tablet (50 mg total) by mouth at bedtime as needed for sleep.    [DISCONTINUED] ALPRAZolam (XANAX) 0.5 MG tablet Take 1 tablet (0.5 mg total) by mouth at bedtime as needed for anxiety or sleep.   [DISCONTINUED] Aspirin-Salicylamide-Caffeine (BC FAST PAIN RELIEF) 650-195-33.3 MG PACK Take 1 packet by mouth daily as needed (leg pain).   [DISCONTINUED] oxyCODONE (ROXICODONE) 5 MG immediate release tablet Take 1 tablet (5 mg total) by mouth every 8 (eight) hours as needed.   [DISCONTINUED] pneumococcal 20-Val Conj Vacc (PREVNAR 20) 0.5 ML injection Inject 0.5 mLs into the muscle tomorrow at 10 am.   [DISCONTINUED] Tdap (BOOSTRIX) 5-2.5-18.5 LF-MCG/0.5 injection Inject 0.5 mLs into the muscle once.   [DISCONTINUED] Zoster Vaccine Adjuvanted [facility]) injection Inject 0.5 mLs into the muscle once.   ALPRAZolam (XANAX) 0.5 MG tablet Take 1 tablet (0.5 mg total) by mouth at bedtime as needed for anxiety or sleep.   pneumococcal 20-Val Conj Vacc (PREVNAR 20) 0.5 ML injection Inject 0.5 mLs into the muscle tomorrow at 10 am for 1 dose.   Tdap (BOOSTRIX) 5-2.5-18.5 LF-MCG/0.5 injection Inject 0.5 mLs into the muscle once for 1 dose.   Zoster Vaccine Adjuvanted [facility]) injection Inject 0.5 mLs into the muscle once for 1 dose.   No facility-administered encounter medications on file as of 06/18/2021.    Surgical History: Past Surgical History:  Procedure Laterality Date   CARDIAC CATHETERIZATION     CATARACT EXTRACTION     COLONOSCOPY     HARDWARE REMOVAL Right 04/30/2021  Procedure: HARDWARE REMOVAL, Right tibia IM nail removal;  Surgeon: Kennedy Bucker, MD;  Location: [facility] ORS;  Service: Orthopedics;  Laterality: Right;   HERNIA REPAIR Bilateral    LEG SURGERY Right    rod in leg   WRIST SURGERY Left     Medical History: Past Medical History:  Diagnosis Date   Anginal pain (HCC)    Anxiety    Aortic atherosclerosis (HCC)    Arthritis    Atrial fibrillation (HCC)    CAD (coronary artery disease)    Carotid artery stenosis    Chronic anticoagulation     Rivaroxaban   Cirrhosis (HCC) 06/15/2020   Complication of anesthesia    seizure like activity with propofol during colonoscopy   COPD (chronic obstructive pulmonary disease) (HCC)    GERD (gastroesophageal reflux disease)    Grade I diastolic dysfunction    Hemochromatosis    Hepatitis C 06/15/2020   Tx'd with Harvoni course   HLD (hyperlipidemia)    Hypertension    Hypothyroidism    Lung nodule    Myocardial infarction (HCC) 2019   x2 MI's no stents   Thrombocytopenia (HCC)     Family History: Family History  Problem Relation Age of Onset   Cancer Mother    COPD Mother    Cancer Sister     Social History   Socioeconomic History   Marital status: Single    Spouse name: Not on file   Number of children: Not on file   Years of education: Not on file   Highest education level: Not on file  Occupational History   Not on file  Tobacco Use   Smoking status: Every Day    Packs/day: 1.00    Years: 50.00    Pack years: 50.00    Types: Cigarettes   Smokeless tobacco: Never   Tobacco comments:    pack last about 4 days now 01-12-2021  Vaping Use   Vaping Use: Never used  Substance and Sexual Activity   Alcohol use: Never   Drug use: Never   Sexual activity: Not on file  Other Topics Concern   Not on file  Social History Narrative   Alcohol/beer once/twice a month; 1/2 ppd; used to work in [employer]. In [place]; with sister.    Social Determinants of Health   Financial Resource Strain: Not on file  Food Insecurity: Not on file  Transportation Needs: Not on file  Physical Activity: Not on file  Stress: Not on file  Social Connections: Not on file  Intimate Partner Violence: Not on file      Review of Systems  Constitutional:  Negative for chills, fatigue and unexpected weight change.  HENT:  Negative for congestion, postnasal drip, rhinorrhea, sneezing and sore throat.   Eyes:  Negative for redness.  Respiratory:  Negative for cough, chest  tightness and shortness of breath.   Cardiovascular:  Negative for chest pain and palpitations.  Gastrointestinal:  Negative for abdominal pain, constipation, diarrhea, nausea and vomiting.  Genitourinary:  Negative for dysuria and frequency.  Musculoskeletal:  Positive for arthralgias. Negative for back pain, joint swelling and neck pain.  Skin:  Negative for rash.  Neurological: Negative.  Negative for tremors and numbness.  Hematological:  Negative for adenopathy. Does not bruise/bleed easily.  Psychiatric/Behavioral:  Positive for sleep disturbance. Negative for behavioral problems (Depression) and suicidal ideas. The patient is nervous/anxious.    Vital Signs: BP 124/78   Pulse 89   Temp 97.8 F (36.6 C)  Resp 16   Ht 5\' 6"  (1.676 m)   Wt 173 lb (78.5 kg)   SpO2 98%   BMI 27.92 kg/m    Physical Exam Vitals and nursing note reviewed.  Constitutional:      General: He is not in acute distress.    Appearance: He is well-developed. He is not diaphoretic.  HENT:     Head: Normocephalic and atraumatic.     Mouth/Throat:     Pharynx: No oropharyngeal exudate.  Eyes:     Pupils: Pupils are equal, round, and reactive to light.  Neck:     Thyroid: No thyromegaly.     Vascular: No JVD.     Trachea: No tracheal deviation.  Cardiovascular:     Rate and Rhythm: Normal rate. Rhythm irregular.     Heart sounds: Normal heart sounds. No murmur heard.   No friction rub. No gallop.  Pulmonary:     Effort: Pulmonary effort is normal. No respiratory distress.     Breath sounds: No wheezing or rales.  Chest:     Chest wall: No tenderness.  Abdominal:     General: Bowel sounds are normal.     Palpations: Abdomen is soft.  Musculoskeletal:        General: Normal range of motion.     Cervical back: Normal range of motion and neck supple.  Lymphadenopathy:     Cervical: No cervical adenopathy.  Skin:    General: Skin is warm and dry.  Neurological:     Mental Status: He is alert  and oriented to person, place, and time.     Cranial Nerves: No cranial nerve deficit.  Psychiatric:        Behavior: Behavior normal.        Thought Content: Thought content normal.        Judgment: Judgment normal.       Assessment/Plan: 1. Essential hypertension Stable, continue current medications  2. Primary insomnia May continue xanax before bed as needed - ALPRAZolam (XANAX) 0.5 MG tablet; Take 1 tablet (0.5 mg total) by mouth at bedtime as needed for anxiety or sleep.  Dispense: 30 tablet; Refill: 2  3. PAF (paroxysmal atrial fibrillation) (HCC) Followed by cardiology, on xarelto  4. Acquired hypothyroidism Continue synthroid  5. Mixed hyperlipidemia Continue lipitor   General Counseling: shahmeer bunn understanding of the findings of todays visit and agrees with plan of treatment. I have discussed any further diagnostic evaluation that may be needed or ordered today. We also reviewed his medications today. he has been encouraged to call the office with any questions or concerns that should arise related to todays visit.    No orders of the defined types were placed in this encounter.   Meds ordered this encounter  Medications   ALPRAZolam (XANAX) 0.5 MG tablet    Sig: Take 1 tablet (0.5 mg total) by mouth at bedtime as needed for anxiety or sleep.    Dispense:  30 tablet    Refill:  2   Tdap (BOOSTRIX) 5-2.5-18.5 LF-MCG/0.5 injection    Sig: Inject 0.5 mLs into the muscle once for 1 dose.    Dispense:  0.5 mL    Refill:  0   Zoster Vaccine Adjuvanted [facility]) injection    Sig: Inject 0.5 mLs into the muscle once for 1 dose.    Dispense:  0.5 mL    Refill:  0   pneumococcal 20-Val Conj Vacc (PREVNAR 20) 0.5 ML injection    Sig: Inject  0.5 mLs into the muscle tomorrow at 10 am for 1 dose.    Dispense:  0.5 mL    Refill:  0    This patient was seen by Lynn Ito, PA-C in collaboration with Dr. Beverely Risen as a part of collaborative care  agreement.   Total time spent:35 Minutes Time spent includes review of chart, medications, test results, and follow up plan with the patient.      Dr Lyndon Code Internal medicine

## 2021-06-19 ENCOUNTER — Other Ambulatory Visit: Payer: Self-pay

## 2021-06-25 ENCOUNTER — Other Ambulatory Visit: Payer: Self-pay

## 2021-06-25 ENCOUNTER — Telehealth: Payer: Self-pay | Admitting: Cardiology

## 2021-06-25 MED ORDER — NIRMATRELVIR/RITONAVIR (PAXLOVID)TABLET: ORAL_TABLET | ORAL | 0 refills | Status: DC

## 2021-06-25 NOTE — Telephone Encounter (Signed)
Paxlovid is contraindicated with Xarelto - will increase concentrations of Xarelto. He takes Xarelto for afib with CHADS2VASc score of 2 (HTN, CAD). He would either need to hold his Xarelto during his course of Paxlovid, or change to Eliquis which also interacts with Paxlovid but patients can take 50% the dose of anticoagulation instead (Eliquis 2.5mg  BID instead of standard 5mg  BID dosing) - will defer to physician for preference. Not recommended to split Xarelto in half since tablets are triangular.  Paxlovid use will also increase concentrations of his hydrocodone (would recommend avoiding use), atorvastatin (ok since he's on lower 10mg  dose), and trazodone (recommend avoiding use).  Paxlovid will decrease concentration of his bupropion and levothyroxine.

## 2021-06-25 NOTE — Telephone Encounter (Signed)
Pt called and advised that his girlfriend tested positive for covid and he was having symptoms also.  Pt requested to have something sent to his pharmacy at [facility].  Spoke to [facility] and to Lauren and they sent paxlovid into the pharmacy

## 2021-06-25 NOTE — Telephone Encounter (Signed)
[facility] in [place] States that patient has been prescribed Paxlovid and sometimes this interacts with Xarelto  Would like to know how Dr Azucena Cecil would like to proceed Please call [facility] pharm at [phone] or the patient directly

## 2021-06-26 NOTE — Telephone Encounter (Signed)
Called patient and informed him of the pharmacists recommendation. Patient stated that he did not start taking the paxlovid and that he is feeling better. He tested negative today.

## 2021-07-15 ENCOUNTER — Encounter: Payer: Self-pay | Admitting: Internal Medicine

## 2021-07-22 ENCOUNTER — Ambulatory Visit: Payer: Medicaid Other | Admitting: Nurse Practitioner

## 2021-08-12 ENCOUNTER — Telehealth: Payer: Self-pay

## 2021-08-13 NOTE — Telephone Encounter (Signed)
error 

## 2021-08-19 ENCOUNTER — Encounter: Payer: Self-pay | Admitting: Internal Medicine

## 2021-08-26 ENCOUNTER — Inpatient Hospital Stay: Payer: Medicaid Other | Attending: Internal Medicine

## 2021-08-26 ENCOUNTER — Other Ambulatory Visit: Payer: Self-pay

## 2021-08-26 ENCOUNTER — Inpatient Hospital Stay: Payer: Medicaid Other

## 2021-08-26 ENCOUNTER — Inpatient Hospital Stay (HOSPITAL_BASED_OUTPATIENT_CLINIC_OR_DEPARTMENT_OTHER): Payer: Medicaid Other | Admitting: Internal Medicine

## 2021-08-26 ENCOUNTER — Encounter: Payer: Self-pay | Admitting: Internal Medicine

## 2021-08-26 VITALS — BP 123/70 | HR 69 | Temp 98.0°F | Resp 18 | Wt 174.6 lb

## 2021-08-26 DIAGNOSIS — I2510 Atherosclerotic heart disease of native coronary artery without angina pectoris: Secondary | ICD-10-CM | POA: Diagnosis not present

## 2021-08-26 DIAGNOSIS — Z8619 Personal history of other infectious and parasitic diseases: Secondary | ICD-10-CM | POA: Diagnosis not present

## 2021-08-26 DIAGNOSIS — K7460 Unspecified cirrhosis of liver: Secondary | ICD-10-CM | POA: Diagnosis not present

## 2021-08-26 LAB — COMPREHENSIVE METABOLIC PANEL
ALT: 30 U/L (ref 0–44)
AST: 25 U/L (ref 15–41)
Albumin: 3.8 g/dL (ref 3.5–5.0)
Alkaline Phosphatase: 73 U/L (ref 38–126)
Anion gap: 7 (ref 5–15)
BUN: 17 mg/dL (ref 8–23)
CO2: 25 mmol/L (ref 22–32)
Calcium: 9.2 mg/dL (ref 8.9–10.3)
Chloride: 102 mmol/L (ref 98–111)
Creatinine, Ser: 1 mg/dL (ref 0.61–1.24)
GFR, Estimated: >60 mL/min (ref >60)
Glucose, Bld: 94 mg/dL (ref 70–99)
Potassium: 3.6 mmol/L (ref 3.5–5.1)
Sodium: 134 mmol/L — ABNORMAL LOW (ref 135–145)
Total Bilirubin: 0.9 mg/dL (ref 0.3–1.2)
Total Protein: 7.6 g/dL (ref 6.5–8.1)

## 2021-08-26 LAB — CBC WITH DIFFERENTIAL/PLATELET
Abs Immature Granulocytes: 0.02 10*3/uL (ref 0.00–0.07)
Basophils Absolute: 0.1 10*3/uL (ref 0.0–0.1)
Basophils Relative: 1 %
Eosinophils Absolute: 0.3 10*3/uL (ref 0.0–0.5)
Eosinophils Relative: 4 %
HCT: 43.2 % (ref 39.0–52.0)
Hemoglobin: 14.8 g/dL (ref 13.0–17.0)
Immature Granulocytes: 0 %
Lymphocytes Relative: 25 %
Lymphs Abs: 1.9 10*3/uL (ref 0.7–4.0)
MCH: 30.8 pg (ref 26.0–34.0)
MCHC: 34.3 g/dL (ref 30.0–36.0)
MCV: 90 fL (ref 80.0–100.0)
Monocytes Absolute: 0.9 10*3/uL (ref 0.1–1.0)
Monocytes Relative: 12 %
Neutro Abs: 4.5 10*3/uL (ref 1.7–7.7)
Neutrophils Relative %: 58 %
Platelets: 89 10*3/uL — ABNORMAL LOW (ref 150–400)
RBC: 4.8 MIL/uL (ref 4.22–5.81)
RDW: 14.3 % (ref 11.5–15.5)
WBC: 7.7 10*3/uL (ref 4.0–10.5)
nRBC: 0 % (ref 0.0–0.2)

## 2021-08-26 LAB — IRON AND TIBC
Iron: 82 ug/dL (ref 45–182)
Saturation Ratios: 18 % (ref 17.9–39.5)
TIBC: 445 ug/dL (ref 250–450)
UIBC: 363 ug/dL

## 2021-08-26 LAB — FERRITIN: Ferritin: 32 ng/mL (ref 24–336)

## 2021-08-26 NOTE — Progress Notes (Signed)
[facility] CONSULT NOTE  Patient Care Team: McDonough, Renard Matter as PCP - General (Physician Assistant) Debbe Odea, MD as PCP - Cardiology (Cardiology)  CHIEF COMPLAINTS/PURPOSE OF CONSULTATION: Hemochromatosis.   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  Two copies of H63D were identified.  C282Y and S65C were negative;Elevated Ferritin/I sat-88%;  SEP 2021- s/p Phlebotomy x3  # Elevated AFP- 73 [SEP 2021]; NOV 2021- MRI- liver NEG;  # Cirrhosis/ HEP C [started end of sep,2021 x 12 weeks; Dr.Wohl]  # CAD- on xarelto.   HISTORY OF PRESENTING ILLNESS: Ambulating independently.  Alone. Gabriel Hoffman 65 y.o.  male history of hepatitis C/cirrhosis splenomegaly and history of hereditary hemochromatosis/elevated iron saturation/ferritin is here for follow-up.  Patient denies abdominal pain denies any nausea vomiting.  No swelling in the legs.  Denies any easy bruising or bleeding.  Complains of chronic back pain recent worsening.  Denies any trauma.  Review of Systems  Constitutional:  Positive for malaise/fatigue. Negative for chills, diaphoresis, fever and weight loss.  HENT:  Negative for nosebleeds and sore throat.   Eyes:  Negative for double vision.  Respiratory:  Negative for cough, hemoptysis, sputum production, shortness of breath and wheezing.   Cardiovascular:  Negative for chest pain, palpitations, orthopnea and leg swelling.  Gastrointestinal:  Negative for abdominal pain, blood in stool, constipation, diarrhea, heartburn, melena, nausea and vomiting.  Genitourinary:  Negative for dysuria, frequency and urgency.  Musculoskeletal:  Positive for back pain, joint pain and myalgias.  Skin: Negative.  Negative for itching and rash.  Neurological:  Negative for dizziness, tingling, focal weakness, weakness and headaches.   Endo/Heme/Allergies:  Does not bruise/bleed easily.  Psychiatric/Behavioral:  Negative for depression. The patient is not nervous/anxious and does not have insomnia.    MEDICAL HISTORY:  Past Medical History:  Diagnosis Date  . Anginal pain (HCC)   . Anxiety   . Aortic atherosclerosis (HCC)   . Arthritis   . Atrial fibrillation (HCC)   . CAD (coronary artery disease)   . Carotid artery stenosis   . Chronic anticoagulation    Rivaroxaban  . Cirrhosis (HCC) 06/15/2020  . Complication of anesthesia    seizure like activity with propofol during colonoscopy  . COPD (chronic obstructive pulmonary disease) (HCC)   . GERD (gastroesophageal reflux disease)   . Grade I diastolic dysfunction   . Hemochromatosis   . Hepatitis C 06/15/2020   Tx'd with Harvoni course  . HLD (hyperlipidemia)   . Hypertension   . Hypothyroidism   . Lung nodule   . Myocardial infarction (HCC) 2019   x2 MI's no stents  . Thrombocytopenia (HCC)     SURGICAL HISTORY: Past Surgical History:  Procedure Laterality Date  . CARDIAC CATHETERIZATION    . CATARACT EXTRACTION    . COLONOSCOPY    . HARDWARE REMOVAL Right 04/30/2021   Procedure: HARDWARE REMOVAL, Right tibia IM nail removal;  Surgeon: Kennedy Bucker, MD;  Location: [facility] ORS;  Service: Orthopedics;  Laterality: Right;  . HERNIA REPAIR Bilateral   . LEG SURGERY Right    rod in leg  . WRIST SURGERY Left     SOCIAL HISTORY: Social History   Socioeconomic History  . Marital status: Single    Spouse name: Not on file  . Number of children: Not on file  . Years of education: Not on file  . Highest  education level: Not on file  Occupational History  . Not on file  Tobacco Use  . Smoking status: Every Day    Packs/day: 1.00    Years: 50.00    Pack years: 50.00    Types: Cigarettes  . Smokeless tobacco: Never  . Tobacco comments:    pack last about 4 days now 01-12-2021  Vaping Use  . Vaping Use: Never used  Substance and Sexual Activity   . Alcohol use: Never  . Drug use: Never  . Sexual activity: Not on file  Other Topics Concern  . Not on file  Social History Narrative   Alcohol/beer once/twice a month; 1/2 ppd; used to work in [employer]. In [place]; with sister.    Social Determinants of Health   Financial Resource Strain: Not on file  Food Insecurity: Not on file  Transportation Needs: Not on file  Physical Activity: Not on file  Stress: Not on file  Social Connections: Not on file  Intimate Partner Violence: Not on file    FAMILY HISTORY: Family History  Problem Relation Age of Onset  . Cancer Mother   . COPD Mother   . Cancer Sister     ALLERGIES:  is allergic to codeine and propofol.  MEDICATIONS:  Current Outpatient Medications  Medication Sig Dispense Refill  . ALPRAZolam (XANAX) 0.5 MG tablet Take 1 tablet (0.5 mg total) by mouth at bedtime as needed for anxiety or sleep. 30 tablet 2  . atorvastatin (LIPITOR) 10 MG tablet Take 1 tablet (10 mg total) by mouth daily. 90 tablet 1  . buPROPion (WELLBUTRIN XL) 150 MG 24 hr tablet Take 1 tablet by mouth daily.    Marland Kitchen escitalopram (LEXAPRO) 5 MG tablet Take 1 tablet by mouth daily.    . hydrochlorothiazide (HYDRODIURIL) 25 MG tablet Take 1 tablet (25 mg total) by mouth daily. 90 tablet 1  . HYDROcodone-acetaminophen (NORCO/VICODIN) 5-325 MG tablet Take 1 tablet by mouth every 4 (four) hours as needed for moderate pain or severe pain. 30 tablet 0  . Hypromell-Glycerin-Naphazoline (CLEAR EYES FOR DRY EYES PLUS) 0.8-0.25-0.012 % SOLN Place 1-2 drops into both eyes 3 (three) times daily as needed (dry/irritated eyes).    Marland Kitchen levothyroxine (SYNTHROID) 125 MCG tablet Take 1 tablet (125 mcg total) by mouth daily. 90 tablet 1  . lisinopril (ZESTRIL) 10 MG tablet Take 1 tablet (10 mg total) by mouth daily. 90 tablet 1  . metoprolol succinate (TOPROL-XL) 100 MG 24 hr tablet Take 1 tablet by mouth daily.    . naloxone (NARCAN) nasal spray 4 mg/0.1 mL  SMARTSIG:Spray(s) In Nostril    . nirmatrelvir/ritonavir EUA (PAXLOVID) 20 x 150 MG & 10 x 100MG  TABS Take 3 tablets by mouth twice a day for 5 days 30 tablet 0  . nitroGLYCERIN (NITROSTAT) 0.4 MG SL tablet Place 0.4 mg under the tongue every 5 (five) minutes x 3 doses as needed for chest pain.    . pantoprazole (PROTONIX) 20 MG tablet Take 1 tablet by mouth once daily 90 tablet 1  . rivaroxaban (XARELTO) 20 MG TABS tablet Take 1 tablet (20 mg total) by mouth in the morning.    . traZODone (DESYREL) 50 MG tablet Take 1 tablet (50 mg total) by mouth at bedtime as needed for sleep.     No current facility-administered medications for this visit.      PHYSICAL EXAMINATION:   Vitals:   08/26/21 1304  BP: 123/70  Pulse: 69  Resp: 18  Temp: 98  F (36.7 C)  SpO2: 98%   Filed Weights   08/26/21 1304  Weight: 174 lb 9.6 oz (79.2 kg)    Physical Exam HENT:     Head: Normocephalic and atraumatic.     Mouth/Throat:     Pharynx: No oropharyngeal exudate.  Eyes:     Pupils: Pupils are equal, round, and reactive to light.  Cardiovascular:     Rate and Rhythm: Normal rate and regular rhythm.  Pulmonary:     Effort: No respiratory distress.     Breath sounds: No wheezing.     Comments: Decreased air entry bilaterally.  Abdominal:     General: Bowel sounds are normal. There is no distension.     Palpations: Abdomen is soft. There is no mass.     Tenderness: There is no abdominal tenderness. There is no guarding or rebound.  Musculoskeletal:        General: No tenderness. Normal range of motion.     Cervical back: Normal range of motion and neck supple.  Skin:    General: Skin is warm.  Neurological:     Mental Status: He is alert and oriented to person, place, and time.  Psychiatric:        Mood and Affect: Affect normal.    LABORATORY DATA:  I have reviewed the data as listed Lab Results  Component Value Date   WBC 7.7 08/26/2021   HGB 14.8 08/26/2021   HCT 43.2  08/26/2021   MCV 90.0 08/26/2021   PLT 89 (L) 08/26/2021   Recent Labs    09/19/20 1028 01/26/21 0958 02/17/21 1136 04/27/21 1307 08/26/21 1251  NA 136 139 141  --  134*  K 3.5 4.3 4.4 3.8 3.6  CL 102 102 101  --  102  CO2 25 26 26   --  25  GLUCOSE 109* 115* 75  --  94  BUN 18 15 17   --  17  CREATININE 0.87 1.23 1.08  --  1.00  CALCIUM 9.3 9.8 9.6  --  9.2  GFRNONAA >60 >60  --   --  >60  PROT 7.6 7.7 7.8  --  7.6  ALBUMIN 3.4* 3.6 4.1  --  3.8  AST 28 22 29   --  25  ALT 22 22 23   --  30  ALKPHOS 72 57 76  --  73  BILITOT 1.1 0.9 0.8  --  0.9     No results found.  Hemochromatosis, hereditary (HCC) # Hereditary hemochromatosis-[Elevated iron saturation-iron studies; unclear if patient has organ involvement-given the hepatitis C/cirrhosis]- s/p phelbtomy x3.  March 2022 iron deficiency noted on iron studies.  Iron studies still pending.  # HOLD off phlebotomy at this time.   # Elevated AFP-question secondary cirrhosis; SEP 2021-MRI liver negative for any lesions.  Ultrasound-April 2022-negative.  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 Defer to Gi [Dr.Wohl].    # Active smoker- trying to quit/ Recommend quitting smoking/LCSP [April 2022]-  # back pain- defer to ortho; Dr.Menz.   # DISPOSITION: # NO phlebotomy today # follow up in 6 months- MD; 1 week prior-labs- cbc/cmp;Iron studies/ferritin; AFP; abodmen prior-; possible phlebotomy-Dr.B    All questions were answered. The patient knows to call the clinic with any problems, questions or concerns.   , MD 08/26/2021 2:06 PM

## 2021-08-26 NOTE — Progress Notes (Signed)
Per MD no phlebotomy today. Patient discharged with next appointments, stable

## 2021-08-26 NOTE — Assessment & Plan Note (Signed)
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  March 2022 iron deficiency noted on iron studies.  Iron studies still pending.  # HOLD off phlebotomy at this time.   # Elevated AFP-question secondary cirrhosis; SEP 2021-MRI liver negative for any lesions.  Ultrasound-April 2022-negative.  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 Defer to Gi [Dr.Wohl].    # Active smoker- trying to quit/ Recommend quitting smoking/LCSP [April 2022]-  # back pain- defer to ortho; Dr.Menz.   # DISPOSITION: # NO phlebotomy today # follow up in 6 months- MD; 1 week prior-labs- cbc/cmp;Iron studies/ferritin; AFP; [place] abodmen prior-; possible phlebotomy-Dr.B

## 2021-08-27 LAB — AFP TUMOR MARKER: AFP, Serum, Tumor Marker: 3.1 ng/mL (ref 0.0–8.4)

## 2021-09-09 ENCOUNTER — Other Ambulatory Visit: Payer: Self-pay | Admitting: Orthopedic Surgery

## 2021-09-11 ENCOUNTER — Other Ambulatory Visit: Payer: Self-pay

## 2021-09-17 ENCOUNTER — Ambulatory Visit: Payer: Medicaid Other | Admitting: Physician Assistant

## 2021-09-17 ENCOUNTER — Other Ambulatory Visit: Payer: Self-pay

## 2021-09-17 ENCOUNTER — Encounter: Payer: Self-pay | Admitting: Physician Assistant

## 2021-09-17 DIAGNOSIS — F411 Generalized anxiety disorder: Secondary | ICD-10-CM | POA: Diagnosis not present

## 2021-09-17 DIAGNOSIS — F5101 Primary insomnia: Secondary | ICD-10-CM | POA: Diagnosis not present

## 2021-09-17 DIAGNOSIS — I10 Essential (primary) hypertension: Secondary | ICD-10-CM

## 2021-09-17 DIAGNOSIS — I480 Paroxysmal atrial fibrillation: Secondary | ICD-10-CM | POA: Diagnosis not present

## 2021-09-17 DIAGNOSIS — Z23 Encounter for immunization: Secondary | ICD-10-CM

## 2021-09-17 DIAGNOSIS — E039 Hypothyroidism, unspecified: Secondary | ICD-10-CM

## 2021-09-17 MED ORDER — ESCITALOPRAM OXALATE 10 MG PO TABS: 10.0000 mg | ORAL_TABLET | Freq: Every day | ORAL | 1 refills | Status: DC

## 2021-09-17 MED ORDER — ALPRAZOLAM 0.5 MG PO TABS: 0.5000 mg | ORAL_TABLET | Freq: Every evening | ORAL | 2 refills | Status: DC | PRN

## 2021-09-17 MED ORDER — TETANUS-DIPHTH-ACELL PERTUSSIS 5-2.5-18.5 LF-MCG/0.5 IM SUSP: 0.5000 mL | Freq: Once | INTRAMUSCULAR | 0 refills | Status: AC

## 2021-09-17 NOTE — Progress Notes (Signed)
[facility] [place], [place] [address]  Internal MEDICINE  Office Visit Note  Patient Name: Gabriel Hoffman  [id number]  [id number]  Date of Service: 09/22/2021  Chief Complaint  Patient presents with   Follow-up    Discuss meds   Gastroesophageal Reflux   Hyperlipidemia   Hypertension   Anxiety   COPD    HPI Pt is here routine follow up for med refill -having more anxiety lately with several other people in the home plus lots of dogs. Overall anxiety increased in addition to difficulty sleeping. Sometimes feels like he needs another 1/2 tab of xanax at night. Discussed trying to increase lexapro first to help with overall anxiety and pt is agreeable with this. -BP at home 120-130 -Has upcoming surgery for knee replacement in December -Question about when he should have another colonoscopy, states his last colonoscopy was in 2017 and was normal therefore explained it is usually done every 10 years unless a problem or abnormal finding  Current Medication: Outpatient Encounter Medications as of 09/17/2021  Medication Sig   atorvastatin (LIPITOR) 10 MG tablet Take 1 tablet (10 mg total) by mouth daily.   buPROPion (WELLBUTRIN XL) 150 MG 24 hr tablet Take 150 mg by mouth daily.   escitalopram (LEXAPRO) 10 MG tablet Take 1 tablet (10 mg total) by mouth daily.   gabapentin (NEURONTIN) 300 MG capsule Take 300 mg by mouth 3 (three) times daily.   hydrochlorothiazide (HYDRODIURIL) 25 MG tablet Take 1 tablet (25 mg total) by mouth daily.   HYDROcodone-acetaminophen (NORCO/VICODIN) 5-325 MG tablet Take 1 tablet by mouth every 4 (four) hours as needed for moderate pain or severe pain. (Patient not taking: Reported on 09/21/2021)   Hypromell-Glycerin-Naphazoline (CLEAR EYES FOR DRY EYES PLUS) 0.8-0.25-0.012 % SOLN Place 1-2 drops into both eyes 3 (three) times daily as needed (dry/irritated eyes).   levothyroxine (SYNTHROID) 125 MCG tablet Take 1 tablet (125 mcg  total) by mouth daily.   lisinopril (ZESTRIL) 10 MG tablet Take 1 tablet (10 mg total) by mouth daily.   metoprolol succinate (TOPROL-XL) 100 MG 24 hr tablet Take 100 mg by mouth daily.   naloxone (NARCAN) nasal spray 4 mg/0.1 mL Place 0.4 mg into the nose once. (Patient not taking: Reported on 09/21/2021)   nirmatrelvir/ritonavir EUA (PAXLOVID) 20 x 150 MG & 10 x 100MG  TABS Take 3 tablets by mouth twice a day for 5 days   nitroGLYCERIN (NITROSTAT) 0.4 MG SL tablet Place 0.4 mg under the tongue every 5 (five) minutes x 3 doses as needed for chest pain.   pantoprazole (PROTONIX) 20 MG tablet Take 1 tablet by mouth once daily   rivaroxaban (XARELTO) 20 MG TABS tablet Take 1 tablet (20 mg total) by mouth in the morning.   [DISCONTINUED] ALPRAZolam (XANAX) 0.5 MG tablet Take 1 tablet (0.5 mg total) by mouth at bedtime as needed for anxiety or sleep.   [DISCONTINUED] escitalopram (LEXAPRO) 5 MG tablet Take 5 mg by mouth daily.   [DISCONTINUED] Tdap (BOOSTRIX) 5-2.5-18.5 LF-MCG/0.5 injection Inject 0.5 mLs into the muscle once.   [DISCONTINUED] traZODone (DESYREL) 50 MG tablet Take 1 tablet (50 mg total) by mouth at bedtime as needed for sleep.   ALPRAZolam (XANAX) 0.5 MG tablet Take 1 tablet (0.5 mg total) by mouth at bedtime as needed for anxiety or sleep.   [EXPIRED] Tdap (BOOSTRIX) 5-2.5-18.5 LF-MCG/0.5 injection Inject 0.5 mLs into the muscle once for 1 dose.   No facility-administered encounter medications on file  as of 09/17/2021.    Surgical History: Past Surgical History:  Procedure Laterality Date   CARDIAC CATHETERIZATION     CATARACT EXTRACTION Bilateral    COLONOSCOPY     FRACTURE SURGERY Left    pins rods left wrist   HARDWARE REMOVAL Right 04/30/2021   Procedure: HARDWARE REMOVAL, Right tibia IM nail removal;  Surgeon: Hessie Knows, MD;  Location: [facility] ORS;  Service: Orthopedics;  Laterality: Right;   HERNIA REPAIR Bilateral    LEG SURGERY Right    rod in leg   UPPER GI  ENDOSCOPY      Medical History: Past Medical History:  Diagnosis Date   Anginal pain ([place])    Anxiety    Aortic atherosclerosis ([place])    Arthritis    Atrial fibrillation ([place])    a.) CHA2DS2-VASc Score = 2 (HTN, vascular disease).  b.) Rate/rhythm maintained on metoprolol succinate; chronically anticoagulated with rivaroxaban.   CAD (coronary artery disease)    Carotid artery stenosis    Cirrhosis ([place]) [id number]   Complication of anesthesia    a.) seizure like activity with propofol during colonoscopy   COPD (chronic obstructive pulmonary disease) ([place])    Current use of long term anticoagulation    a.) Rivaroxaban   Diastolic dysfunction 0000000   a.) TTE 02/25/2020: normal LV function; EF 55-65%; GLS -15.8%; G1DD.   Dyspnea    GERD (gastroesophageal reflux disease)    Hemochromatosis    Hepatitis C 06/15/2020   Tx'd with Harvoni course   HLD (hyperlipidemia)    Hypertension    Hypothyroidism    Lung nodule    Myocardial infarction ([place]) 2019   x2 MI's no stents   Thrombocytopenia ([place])     Family History: Family History  Problem Relation Age of Onset   Cancer Mother    COPD Mother    Cancer Sister     Social History   Socioeconomic History   Marital status: Single    Spouse name: Not on file   Number of children: Not on file   Years of education: Not on file   Highest education level: Not on file  Occupational History   Not on file  Tobacco Use   Smoking status: Every Day    Packs/day: 0.50    Years: 50.00    Pack years: 25.00    Types: Cigarettes   Smokeless tobacco: Never   Tobacco comments:    pack last about 3 days now 01-12-2021  Vaping Use   Vaping Use: Never used  Substance and Sexual Activity   Alcohol use: Never    Comment: Quit   Drug use: Never   Sexual activity: Not on file  Other Topics Concern   Not on file  Social History Narrative   Alcohol/beer once/twice a month; 1/2 ppd; used to work in [employer]. In [place]; with  sister.    Social Determinants of Health   Financial Resource Strain: Not on file  Food Insecurity: Not on file  Transportation Needs: Not on file  Physical Activity: Not on file  Stress: Not on file  Social Connections: Not on file  Intimate Partner Violence: Not on file      Review of Systems  Constitutional:  Negative for chills, fatigue and unexpected weight change.  HENT:  Negative for congestion, postnasal drip, rhinorrhea, sneezing and sore throat.   Eyes:  Negative for redness.  Respiratory:  Negative for cough, chest tightness and shortness of breath.   Cardiovascular:  Negative for chest  pain and palpitations.  Gastrointestinal:  Negative for abdominal pain, constipation, diarrhea, nausea and vomiting.  Genitourinary:  Negative for dysuria and frequency.  Musculoskeletal:  Positive for arthralgias. Negative for back pain, joint swelling and neck pain.  Skin:  Negative for rash.  Neurological: Negative.  Negative for tremors and numbness.  Hematological:  Negative for adenopathy. Does not bruise/bleed easily.  Psychiatric/Behavioral:  Positive for sleep disturbance. Negative for behavioral problems (Depression) and suicidal ideas. The patient is nervous/anxious.    Vital Signs: BP 124/73   Pulse 86   Temp 98.6 F (37 C)   Resp 16   Ht 5\' 5"  (1.651 m)   Wt 175 lb (79.4 kg)   SpO2 97%   BMI 29.12 kg/m    Physical Exam Vitals and nursing note reviewed.  Constitutional:      General: He is not in acute distress.    Appearance: He is well-developed. He is not diaphoretic.  HENT:     Head: Normocephalic and atraumatic.     Mouth/Throat:     Pharynx: No oropharyngeal exudate.  Eyes:     Pupils: Pupils are equal, round, and reactive to light.  Neck:     Thyroid: No thyromegaly.     Vascular: No JVD.     Trachea: No tracheal deviation.  Cardiovascular:     Rate and Rhythm: Normal rate. Rhythm irregular.     Heart sounds: Normal heart sounds. No murmur  heard.   No friction rub. No gallop.  Pulmonary:     Effort: Pulmonary effort is normal. No respiratory distress.     Breath sounds: No wheezing or rales.  Chest:     Chest wall: No tenderness.  Abdominal:     General: Bowel sounds are normal.     Palpations: Abdomen is soft.  Musculoskeletal:        General: Normal range of motion.     Cervical back: Normal range of motion and neck supple.  Lymphadenopathy:     Cervical: No cervical adenopathy.  Skin:    General: Skin is warm and dry.  Neurological:     Mental Status: He is alert and oriented to person, place, and time.     Cranial Nerves: No cranial nerve deficit.  Psychiatric:        Behavior: Behavior normal.        Thought Content: Thought content normal.        Judgment: Judgment normal.       Assessment/Plan: 1. Essential hypertension Stable, continue current medications  2. Primary insomnia May continue xanax before bed as needed - ALPRAZolam (XANAX) 0.5 MG tablet; Take 1 tablet (0.5 mg total) by mouth at bedtime as needed for anxiety or sleep.  Dispense: 30 tablet; Refill: 2 [place] Controlled Substance Database was reviewed by me for overdose risk score (ORS) Reviewed risks and possible side effects associated with taking opiates, benzodiazepines and other CNS depressants. Combination of these could cause dizziness and drowsiness. Advised patient not to drive or operate machinery when taking these medications, as patient's and other's life can be at risk and will have consequences. Patient verbalized understanding in this matter. Dependence and abuse for these drugs will be monitored closely. A Controlled substance policy and procedure is on file which allows [facility] to order a urine drug screen test at any visit. Patient understands and agrees with the plan  3. GAD (generalized anxiety disorder) Will increase to 10mg  to help with overall anxiety - escitalopram (LEXAPRO) 10  MG tablet; Take 1 tablet (10 mg  total) by mouth daily.  Dispense: 90 tablet; Refill: 1  4. PAF (paroxysmal atrial fibrillation) ([place]) Followed by cardiology  5. Acquired hypothyroidism Continue synthroid  6. Need for Tdap vaccination - Tdap ([place]) 5-2.5-18.5 LF-MCG/0.5 injection; Inject 0.5 mLs into the muscle once for 1 dose.  Dispense: 0.5 mL; Refill: 0   General Counseling: savage artrip understanding of the findings of todays visit and agrees with plan of treatment. I have discussed any further diagnostic evaluation that may be needed or ordered today. We also reviewed his medications today. he has been encouraged to call the office with any questions or concerns that should arise related to todays visit.    No orders of the defined types were placed in this encounter.   Meds ordered this encounter  Medications   Tdap (BOOSTRIX) 5-2.5-18.5 LF-MCG/0.5 injection    Sig: Inject 0.5 mLs into the muscle once for 1 dose.    Dispense:  0.5 mL    Refill:  0   escitalopram (LEXAPRO) 10 MG tablet    Sig: Take 1 tablet (10 mg total) by mouth daily.    Dispense:  90 tablet    Refill:  1   ALPRAZolam (XANAX) 0.5 MG tablet    Sig: Take 1 tablet (0.5 mg total) by mouth at bedtime as needed for anxiety or sleep.    Dispense:  30 tablet    Refill:  2    This patient was seen by Drema Dallas, PA-C in collaboration with Dr. Clayborn Bigness as a part of collaborative care agreement.   Total time spent:30 Minutes Time spent includes review of chart, medications, test results, and follow up plan with the patient.      Dr Lavera Guise Internal medicine

## 2021-09-21 ENCOUNTER — Telehealth: Payer: Self-pay | Admitting: *Deleted

## 2021-09-21 ENCOUNTER — Encounter: Payer: Self-pay | Admitting: Orthopedic Surgery

## 2021-09-21 ENCOUNTER — Other Ambulatory Visit: Payer: Self-pay

## 2021-09-21 ENCOUNTER — Encounter
Admission: RE | Admit: 2021-09-21 | Discharge: 2021-09-21 | Disposition: A | Discharge status: Home / Self Care | Payer: Medicaid Other | Source: Ambulatory Visit | Attending: Orthopedic Surgery | Admitting: Orthopedic Surgery

## 2021-09-21 VITALS — BP 131/79 | HR 65 | Resp 22 | Ht 65.0 in | Wt 175.0 lb

## 2021-09-21 DIAGNOSIS — D75839 Thrombocytosis, unspecified: Secondary | ICD-10-CM | POA: Insufficient documentation

## 2021-09-21 DIAGNOSIS — K7460 Unspecified cirrhosis of liver: Secondary | ICD-10-CM | POA: Diagnosis not present

## 2021-09-21 DIAGNOSIS — Z8719 Personal history of other diseases of the digestive system: Secondary | ICD-10-CM

## 2021-09-21 DIAGNOSIS — Z01818 Encounter for other preprocedural examination: Secondary | ICD-10-CM | POA: Diagnosis present

## 2021-09-21 DIAGNOSIS — D696 Thrombocytopenia, unspecified: Secondary | ICD-10-CM

## 2021-09-21 HISTORY — DX: Dyspnea, unspecified: R06.00

## 2021-09-21 LAB — URINALYSIS, ROUTINE W REFLEX MICROSCOPIC
Bacteria, UA: NONE SEEN
Bilirubin Urine: NEGATIVE
Glucose, UA: NEGATIVE mg/dL
Ketones, ur: NEGATIVE mg/dL
Leukocytes,Ua: NEGATIVE
Nitrite: NEGATIVE
Protein, ur: NEGATIVE mg/dL
Specific Gravity, Urine: 1.02 (ref 1.005–1.030)
Squamous Epithelial / HPF: NONE SEEN (ref 0–5)
pH: 5 (ref 5.0–8.0)

## 2021-09-21 LAB — CBC WITH DIFFERENTIAL/PLATELET
Abs Immature Granulocytes: 0.03 10*3/uL (ref 0.00–0.07)
Basophils Absolute: 0.1 10*3/uL (ref 0.0–0.1)
Basophils Relative: 2 %
Eosinophils Absolute: 0.3 10*3/uL (ref 0.0–0.5)
Eosinophils Relative: 3 %
HCT: 45.1 % (ref 39.0–52.0)
Hemoglobin: 15.7 g/dL (ref 13.0–17.0)
Immature Granulocytes: 0 %
Lymphocytes Relative: 20 %
Lymphs Abs: 1.8 10*3/uL (ref 0.7–4.0)
MCH: 31.7 pg (ref 26.0–34.0)
MCHC: 34.8 g/dL (ref 30.0–36.0)
MCV: 90.9 fL (ref 80.0–100.0)
Monocytes Absolute: 0.9 10*3/uL (ref 0.1–1.0)
Monocytes Relative: 10 %
Neutro Abs: 5.5 10*3/uL (ref 1.7–7.7)
Neutrophils Relative %: 65 %
Platelets: 103 10*3/uL — ABNORMAL LOW (ref 150–400)
RBC: 4.96 MIL/uL (ref 4.22–5.81)
RDW: 14.2 % (ref 11.5–15.5)
WBC: 8.6 10*3/uL (ref 4.0–10.5)
nRBC: 0 % (ref 0.0–0.2)

## 2021-09-21 LAB — COMPREHENSIVE METABOLIC PANEL
ALT: 30 U/L (ref 0–44)
AST: 30 U/L (ref 15–41)
Albumin: 3.6 g/dL (ref 3.5–5.0)
Alkaline Phosphatase: 72 U/L (ref 38–126)
Anion gap: 7 (ref 5–15)
BUN: 22 mg/dL (ref 8–23)
CO2: 25 mmol/L (ref 22–32)
Calcium: 9.5 mg/dL (ref 8.9–10.3)
Chloride: 106 mmol/L (ref 98–111)
Creatinine, Ser: 1.14 mg/dL (ref 0.61–1.24)
GFR, Estimated: >60 mL/min (ref >60)
Glucose, Bld: 102 mg/dL — ABNORMAL HIGH (ref 70–99)
Potassium: 4 mmol/L (ref 3.5–5.1)
Sodium: 138 mmol/L (ref 135–145)
Total Bilirubin: 1.1 mg/dL (ref 0.3–1.2)
Total Protein: 7.7 g/dL (ref 6.5–8.1)

## 2021-09-21 LAB — TYPE AND SCREEN
ABO/RH(D): A POS
Antibody Screen: NEGATIVE

## 2021-09-21 LAB — SURGICAL PCR SCREEN
MRSA, PCR: NEGATIVE
Staphylococcus aureus: NEGATIVE

## 2021-09-21 LAB — PROTIME-INR
INR: 1.1 (ref 0.8–1.2)
Prothrombin Time: 13.8 seconds (ref 11.4–15.2)

## 2021-09-21 NOTE — Patient Instructions (Signed)
Your procedure is scheduled on: Friday 10/02/21 Report to DAY SURGERY DEPARTMENT LOCATED ON 2ND FLOOR MEDICAL MALL ENTRANCE. To find out your arrival time please call [phone] between 1PM - 3PM on Thursday 10/01/21.  Remember: Instructions that are not followed completely may result in serious medical risk, up to and including death, or upon the discretion of your surgeon and anesthesiologist your surgery may need to be rescheduled.     _X__ 1. Do not eat food after midnight the night before your procedure.                 No gum chewing or hard candies. You may drink clear liquids up to 2 hours                 before you are scheduled to arrive for your surgery- DO not drink clear                 liquids within 2 hours of the start of your surgery.                 Clear Liquids include:  water, apple juice without pulp, clear carbohydrate                 drink such as Clearfast or Gatorade, Black Coffee or Tea (Do not add                 anything to coffee or tea). Diabetics water only  DRINK THE ENSURE "CLEAR" PRE SURGERY DRINK 2 HOURS BEFORE ARRIVING  __X__2.  On the morning of surgery brush your teeth with toothpaste and water, you                 may rinse your mouth with mouthwash if you wish.  Do not swallow any              toothpaste of mouthwash.     _X__ 3.  No Alcohol for 24 hours before or after surgery.   _X__ 4.  Do Not Smoke or use e-cigarettes For 24 Hours Prior to Your Surgery.                 Do not use any chewable tobacco products for at least 6 hours prior to                 surgery.  ____  5.  Bring all medications with you on the day of surgery if instructed.   __X__  6.  Notify your doctor if there is any change in your medical condition      (cold, fever, infections).     Do not wear jewelry, make-up, hairpins, clips or nail polish. Do not wear lotions, powders, or perfumes.  Do not shave body hair 48 hours prior to surgery. Men may shave face and  neck. Do not bring valuables to the hospital.    [facility] is not responsible for any belongings or valuables.  Contacts, dentures/partials or body piercings may not be worn into surgery. Bring a case for your contacts, glasses or hearing aids, a denture cup will be supplied. Leave your suitcase in the car. After surgery it may be brought to your room. For patients admitted to the hospital, discharge time is determined by your treatment team.   Patients discharged the day of surgery will not be allowed to drive home.   Please read over the following fact sheets that you were given:   MRSA Information,  CHG soap, Ensure, EMMI video  __X__ Take these medicines the morning of surgery with A SIP OF WATER:    1. atorvastatin (LIPITOR) 10 MG tablet  2. escitalopram (LEXAPRO) 10 MG tablet  3. gabapentin (NEURONTIN) 300 MG capsule  4. levothyroxine (SYNTHROID) 125 MCG tablet  5. metoprolol succinate (TOPROL-XL) 100 MG 24 hr tablet  6. pantoprazole (PROTONIX) 20 MG tablet  ____ Fleet Enema (as directed)   __X__ Use CHG Soap/SAGE wipes as directed  ____ Use inhalers on the day of surgery  ____ Stop metformin/Janumet/Farxiga 2 days prior to surgery    ____ Take 1/2 of usual insulin dose the night before surgery. No insulin the morning          of surgery.   __X__ Stop Blood Thinners Xarelto 3 DAYS PRIOR TO SURGERY  on   Or contact your Surgeon, Cardiologist or Medical Doctor regarding  ability to stop your blood thinners  __X__ Stop Anti-inflammatories 7 days before surgery such as Advil, Ibuprofen, Motrin,  BC or Goodies Powder, Naprosyn, Naproxen, Aleve, Aspirin    __X__ Stop all herbal supplements, fish oil or vitamin E until after surgery.    ____ Bring C-Pap to the hospital.

## 2021-09-21 NOTE — Telephone Encounter (Signed)
    Patient Name: Gabriel Hoffman  DOB: 04-16-1956 MRN: [id number]  Primary Cardiologist: Debbe Odea, MD  Chart reviewed as part of pre-operative protocol coverage.   Left voice mail to call back.   Pharmacy to review anticoagulation.   [place], [place] 09/21/2021, 9:28 AM

## 2021-09-21 NOTE — Telephone Encounter (Signed)
Request for pre-operative cardiac clearance Received: Today Karen Kitchens, NP  P Cv Div Preop Callback Request for pre-operative cardiac clearance:     1. What type of surgery is being performed?  RIGHT TOTAL KNEE ARTHROPLASTY   2. When is this surgery scheduled?  10/02/2021     3. Are there any medications that need to be held prior to surgery?  RIVAROXABAN   4. Practice name and name of physician performing surgery?  Performing surgeon: Dr. Hessie Knows, MD  Requesting clearance: Honor Loh, FNP-C       5. Anesthesia type (none, local, MAC, general)? General   6. What is the office phone and fax number?    Phone: [phone]  Fax: [phone]   ATTENTION: Unable to create telephone message as per your standard workflow. Directed by [facility] providers to send requests for cardiac clearance to this pool for appropriate distribution to provider covering pre-operative clearances.   Honor Loh, MSN, APRN, FNP-C, CEN  [facility]  Peri-operative Services Nurse Practitioner  Phone: [phone]  09/21/21 8:53 AM

## 2021-09-22 NOTE — Telephone Encounter (Signed)
Patient with diagnosis of afib on Xarelto for anticoagulation.    Procedure: right TKA Date of procedure: 10/02/21  CHA2DS2-VASc Score = 2  This indicates a 2.2% annual risk of stroke. The patient's score is based upon: CHF History: 0 HTN History: 1 Diabetes History: 0 Stroke History: 0 Vascular Disease History: 1 Age Score: 0 Gender Score: 0  CrCl 97mL/min Platelet count 103K  Per office protocol, patient can hold Xarelto for 3 days prior to procedure.

## 2021-09-23 NOTE — Telephone Encounter (Addendum)
Contacted patient and got him scheduled with Dr Azucena Cecil on Monday at 1:20 pm. Patient notified and voiced understanding.

## 2021-09-23 NOTE — Telephone Encounter (Signed)
Contacted patient today for further pre-op evaluation and he reported new chest pain that has been occurring a couple of times per week for the last 3-4 weeks. Pain sounds atypical in that it occurs at rest and not with exertion but has had to take Nitro a couple of times. This is a new complaint since his last office visit in the Spring. He does have a history of MI. Therefore, he will need a office visit for further evaluation prior to surgery.   Pre-op covering staff, can you please help arrange this and notify requesting office of need for this appointment? His surgery is scheduled for 10/02/2021 so I am hoping we can get him seen early next week.  Thank you!

## 2021-09-28 ENCOUNTER — Encounter: Payer: Self-pay | Admitting: Cardiology

## 2021-09-28 ENCOUNTER — Ambulatory Visit (INDEPENDENT_AMBULATORY_CARE_PROVIDER_SITE_OTHER): Payer: Medicaid Other

## 2021-09-28 ENCOUNTER — Other Ambulatory Visit: Payer: Self-pay

## 2021-09-28 ENCOUNTER — Ambulatory Visit: Payer: Medicaid Other | Admitting: Cardiology

## 2021-09-28 ENCOUNTER — Encounter: Payer: Self-pay | Admitting: Orthopedic Surgery

## 2021-09-28 VITALS — BP 110/60 | HR 72 | Ht 65.0 in | Wt 175.0 lb

## 2021-09-28 DIAGNOSIS — I10 Essential (primary) hypertension: Secondary | ICD-10-CM | POA: Diagnosis not present

## 2021-09-28 DIAGNOSIS — I480 Paroxysmal atrial fibrillation: Secondary | ICD-10-CM

## 2021-09-28 DIAGNOSIS — Z01818 Encounter for other preprocedural examination: Secondary | ICD-10-CM | POA: Diagnosis not present

## 2021-09-28 NOTE — Progress Notes (Signed)
Perioperative Services  Pre-Admission/Anesthesia Testing Clinical Review  Date: 09/29/21  Patient Demographics:  Name: Gabriel Hoffman DOB:   [birth year] MRN:   [id number]  Planned Surgical Procedure(s):    Case: [id number] Date/Time: 10/02/21 1230   Procedure: TOTAL KNEE ARTHROPLASTY (Right: Knee)   Anesthesia type: Choice   Pre-op diagnosis: Primary osteoarthritis of right knee  M17.11   Location: [facility] OR ROOM 08 / [facility]   Surgeons: Gabriel Knows, MD   NOTE: Available PAT nursing documentation and vital signs have been reviewed. Clinical nursing staff has updated patient's PMH/PSHx, current medication list, and drug allergies/intolerances to ensure comprehensive history available to assist in medical decision making as it pertains to the aforementioned surgical procedure and anticipated anesthetic course. Extensive review of available clinical information performed. Gabriel Hoffman PMH and PSHx updated with any diagnoses/procedures that  may have been inadvertently omitted during his intake with the pre-admission testing department's nursing staff.  Clinical Discussion:  Gabriel Hoffman is a 65 y.o. male who is submitted for pre-surgical anesthesia review and clearance prior to him undergoing the above procedure. Patient is a Current Smoker (50 pack year). Pertinent PMH includes: CAD MI, angina, atrial fibrillation, G1DD, aortic atherosclerosis, carotid artery stenosis, HTN, HLD, hypothyroidism, COPD, GERD (on daily PPI), thrombocytopenia, hemochromatosis, HCV (s/p Tx with Harvoni), cirrhosis, OA, anxiety (on BZO).   Patient is followed by cardiology Gabriel Lah, MD). He was last seen in the cardiology clinic on 09/28/2021; notes reviewed.  At the time of his clinic visit, patient doing well overall from a cardiovascular perspective.  He denied any episodes of chest pain, shortness of breath, PND, orthopnea, significant peripheral edema, vertiginous symptoms, or  presyncope/syncope.  Patient with occasional palpitations that he reported occurred approximately twice a month. Patient with a PMH significant for cardiovascular diagnoses.   Patient reportedly suffered an MI in 12/2018 while incarcerated.  Per cardiology notes, patient taken to a hospital in [place] where he was confined for 1 week; no diagnostic procedures were performed.   Patient reported that he was seen at a facility and [facility] in 07/2019 at which time he was diagnosed with atrial fibrillation and started on chronic anticoagulation therapy (rivaroxaban).  Patient underwent catheter ablation procedure.   TTE performed on 02/05/2020 revealed a normal left ventricular systolic function.  LVEF 55 to 60%.  Doppler parameters consistent with G1DD.  Left ventricular GLS -15.8%.  There was mild mitral valve regurgitation. No evidence of a significant transvalvular gradient to suggest stenosis.   Patient on chronic anticoagulation following catheter ablation procedure; CHA2DS2-VASc Score = 2 (HTN, vascular disease).  Rate and rhythm maintained on metoprolol succinate. Patient chronically anticoagulated using rivaroxaban; compliant with therapy with no evidence or reports of GI bleeding. Blood pressure reasonably controlled at 110/60 on currently prescribed diuretic, beta-blocker, and ACEi therapies. Patient is on a statin for his HLD. Functional capacity, as defined by DASI, is documented as being >/= 4 METS. No changes were made to patient's medication regimen.  Patient to follow-up with outpatient cardiology in 6 months or sooner if needed.  Gabriel Hoffman is scheduled for an elective RIGHT TOTAL KNEE ARTHROPLASTY on 10/02/2021 with Dr. Hessie Knows, MD.  Given patient's past medical history significant for cardiovascular diagnoses, presurgical cardiac clearance was sought by the PAT team. Per cardiology, "last echo with normal EF. Patient with no chest pain. Clinically asymptomatic from a cardiac  perspective. Okay to proceed with surgery from a cardiac perspective". Again, this patient is on  daily anticoagulation therapy. He has been instructed on recommendations for holding his rivaroxaban for 2 days prior to his procedure with plans to restart as soon as postoperative bleeding risk felt to be minimized by his attending surgeon. The patient has been instructed that his last dose of his anticoagulant will be on 11/29/20222.  Patient reports previous perioperative complications with anesthesia in the past.  He advises that he experienced (+) seizure-like activity with propofol during a routine colonoscopy. In review of the available records, it is noted that patient underwent a general anesthetic course here (ASA III) in 04/2021 without documented complications.   Vitals with BMI 09/28/2021 09/21/2021 09/17/2021  Height 5\' 5"  5\' 5"  5\' 5"   Weight 175 lbs 175 lbs 175 lbs  BMI 29.12 29.12 29.12  Systolic 110 131  Diastolic 60 79 73  Pulse 72 65 86    Providers/Specialists:   NOTE: Primary physician provider listed below. Patient may have been seen by APP or partner within same practice.   PROVIDER ROLE / SPECIALTY LAST , MD Orthopedics 09/21/2021  [id number], PA-C Primary Care Provider 06/18/2021  09/23/2021, MD Cardiology 09/28/2021  06/20/2021, MD Hematology 08/26/2021   Allergies:  Codeine and Propofol  Current Home Medications:   No current facility-administered medications for this encounter.    atorvastatin (LIPITOR) 10 MG tablet   buPROPion (WELLBUTRIN XL) 150 MG 24 hr tablet   gabapentin (NEURONTIN) 300 MG capsule   hydrochlorothiazide (HYDRODIURIL) 25 MG tablet   Hypromell-Glycerin-Naphazoline (CLEAR EYES FOR DRY EYES PLUS) 0.8-0.25-0.012 % SOLN   levothyroxine (SYNTHROID) 125 MCG tablet   lisinopril (ZESTRIL) 10 MG tablet   metoprolol succinate (TOPROL-XL) 100 MG 24 hr tablet   nitroGLYCERIN (NITROSTAT) 0.4 MG SL  tablet   pantoprazole (PROTONIX) 20 MG tablet   rivaroxaban (XARELTO) 20 MG TABS tablet   ALPRAZolam (XANAX) 0.5 MG tablet   escitalopram (LEXAPRO) 10 MG tablet   History:   Past Medical History:  Diagnosis Date   Anginal pain (HCC)    Anxiety    Aortic atherosclerosis (HCC)    Arthritis    Atrial fibrillation (HCC)    a.) CHA2DS2-VASc Score = 2 (HTN, vascular disease).  b.) Rate/rhythm maintained on metoprolol succinate; chronically anticoagulated with rivaroxaban.   CAD (coronary artery disease)    Carotid artery stenosis    Cirrhosis (HCC) 06/15/2020   Complication of anesthesia    a.) seizure like activity with propofol during colonoscopy   COPD (chronic obstructive pulmonary disease) (HCC)    Current use of long term anticoagulation    a.) Rivaroxaban   Diastolic dysfunction 02/25/2020   a.) TTE 02/25/2020: normal LV function; EF 55-65%; GLS -15.8%; G1DD.   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 C282Y and S65C were negative.   Hepatitis C 06/15/2020   a.) Tx'd with Harvoni course   HLD (hyperlipidemia)    Hypertension    Hypothyroidism    Lung nodule    Myocardial infarction [facility]) 2019   x2 MI's no stents   Thrombocytopenia [facility])    Past Surgical History:  Procedure Laterality Date   CARDIAC CATHETERIZATION     CATARACT EXTRACTION Bilateral    COLONOSCOPY     FRACTURE SURGERY Left    pins rods left wrist   HARDWARE REMOVAL Right 04/30/2021   Procedure: HARDWARE REMOVAL,  Right tibia IM nail removal;  Surgeon: Gabriel Knows, MD;  Location: [facility] ORS;  Service: Orthopedics;  Laterality: Right;   HERNIA REPAIR Bilateral    LEG SURGERY Right    rod in leg   UPPER GI ENDOSCOPY     Family History  Problem Relation Age of Onset   Cancer Mother    COPD Mother    Cancer Sister    Social History   Tobacco Use   Smoking status:  Every Day    Packs/day: 1.00    Years: 50.00    Pack years: 50.00    Types: Cigarettes   Smokeless tobacco: Never   Tobacco comments:    pack last about 3 days now 01-12-2021  Vaping Use   Vaping Use: Never used  Substance Use Topics   Alcohol use: Never    Comment: Quit   Drug use: Never    Pertinent Clinical Results:  LABS: Labs reviewed: Acceptable for surgery.  No visits with results within 3 Day(s) from this visit.  Latest known visit with results is:  Hospital Outpatient Visit on 09/21/2021  Component Date Value Ref Range Status   WBC 09/21/2021 8.6  4.0 - 10.5 K/uL Final   RBC 09/21/2021 4.96  4.22 - 5.81 MIL/uL Final   Hemoglobin 09/21/2021 15.7  13.0 - 17.0 g/dL Final   HCT 09/21/2021 45.1  39.0 - 52.0 % Final   MCV 09/21/2021 90.9  80.0 - 100.0 fL Final   MCH 09/21/2021 31.7  26.0 - 34.0 pg Final   MCHC 09/21/2021 34.8  30.0 - 36.0 g/dL Final   RDW 09/21/2021 14.2  11.5 - 15.5 % Final   Platelets 09/21/2021 103 (L)  150 - 400 K/uL Final   nRBC 09/21/2021 0.0  0.0 - 0.2 % Final   Neutrophils Relative % 09/21/2021 65  % Final   Neutro Abs 09/21/2021 5.5  1.7 - 7.7 K/uL Final   Lymphocytes Relative 09/21/2021 20  % Final   Lymphs Abs 09/21/2021 1.8  0.7 - 4.0 K/uL Final   Monocytes Relative 09/21/2021 10  % Final   Monocytes Absolute 09/21/2021 0.9  0.1 - 1.0 K/uL Final   Eosinophils Relative 09/21/2021 3  % Final   Eosinophils Absolute 09/21/2021 0.3  0.0 - 0.5 K/uL Final   Basophils Relative 09/21/2021 2  % Final   Basophils Absolute 09/21/2021 0.1  0.0 - 0.1 K/uL Final   Immature Granulocytes 09/21/2021 0  % Final   Abs Immature Granulocytes 09/21/2021 0.03  0.00 - 0.07 K/uL Final   Performed at [facility], [address] [place], [place] [address]   Sodium 09/21/2021 138  135 - 145 mmol/L Final   Potassium 09/21/2021 4.0  3.5 - 5.1 mmol/L Final   Chloride 09/21/2021 106  98 - 111 mmol/L Final   CO2 09/21/2021 25  22 - 32 mmol/L Final   Glucose,  Bld 09/21/2021 102 (H)  70 - 99 mg/dL Final   Glucose reference range applies only to samples taken after fasting for at least 8 hours.   BUN 09/21/2021 22  8 - 23 mg/dL Final   Creatinine, Ser 09/21/2021 1.14  0.61 - 1.24 mg/dL Final   Calcium 09/21/2021 9.5  8.9 - 10.3 mg/dL Final   Total Protein 09/21/2021 7.7  6.5 - 8.1 g/dL Final   Albumin 09/21/2021 3.6  3.5 - 5.0 g/dL Final   AST 09/21/2021 30  15 - 41 U/L Final   ALT 09/21/2021 30  0 -  44 U/L Final   Alkaline Phosphatase 09/21/2021 72  38 - 126 U/L Final   Total Bilirubin 09/21/2021 1.1  0.3 - 1.2 mg/dL Final   GFR, Estimated 09/21/2021 >60  >60 mL/min Final   Comment: (NOTE) Calculated using the CKD-EPI Creatinine Equation (2021)   Anion gap 09/21/2021 7  5 - 15 Final   Performed at [facility], [place], [place] [address]   Color, Urine 09/21/2021 YELLOW (A)  YELLOW Final   APPearance 09/21/2021 CLEAR (A)  CLEAR Final   Specific Gravity, Urine 09/21/2021 1.020  1.005 - 1.030 Final   pH 09/21/2021 5.0  5.0 - 8.0 Final   Glucose, UA 09/21/2021 NEGATIVE  NEGATIVE mg/dL Final   Hgb urine dipstick 09/21/2021 SMALL (A)  NEGATIVE Final   Bilirubin Urine 09/21/2021 NEGATIVE  NEGATIVE Final   Ketones, ur 09/21/2021 NEGATIVE  NEGATIVE mg/dL Final   Protein, ur 09/21/2021 NEGATIVE  NEGATIVE mg/dL Final   Nitrite 09/21/2021 NEGATIVE  NEGATIVE Final   Leukocytes,Ua 09/21/2021 NEGATIVE  NEGATIVE Final   RBC / HPF 09/21/2021 0-5  0 - 5 RBC/hpf Final   WBC, UA 09/21/2021 0-5  0 - 5 WBC/hpf Final   Bacteria, UA 09/21/2021 NONE SEEN  NONE SEEN Final   Squamous Epithelial / LPF 09/21/2021 NONE SEEN  0 - 5 Final   Mucus 09/21/2021 PRESENT   Final   Hyaline Casts, UA 09/21/2021 PRESENT   Final   Performed at [facility], [address] [place], [place] [address]   ABO/RH(D) 09/21/2021 A POS   Final   Antibody Screen 09/21/2021 NEG   Final   Sample Expiration 09/21/2021 10/05/2021,2359   Final   Extend  sample reason 09/21/2021    Final                   Value:NO TRANSFUSIONS OR PREGNANCY IN THE PAST 3 MONTHS Performed at [facility], [address] [place], [place] [address]    Prothrombin Time 09/21/2021 13.8  11.4 - 15.2 seconds Final   INR 09/21/2021 1.1  0.8 - 1.2 Final   Comment: (NOTE) INR goal varies based on device and disease states. Performed at [facility], [address] [place], [place] [address]   MRSA, PCR 09/21/2021 NEGATIVE  NEGATIVE Final   Staphylococcus aureus 09/21/2021 NEGATIVE  NEGATIVE Final   Comment: (NOTE) The Xpert SA Assay (FDA approved for NASAL specimens in patients 42 years of age and older), is one component of a comprehensive surveillance program. It is not intended to diagnose infection nor to guide or monitor treatment. Performed at [facility], [address] [place],  [address]    ECG: Date: 09/21/2021 Time ECG obtained: 1017 AM Rate: 71 bpm Rhythm:  Sinus rhythm with occasional PVCs Axis (leads I and aVF): Normal Intervals: PR 166 ms. QRS 76 ms. QTc 419 ms. ST segment and T wave changes: No evidence of acute ST segment elevation or depression Comparison: Similar to previous tracing obtained on 02/24/2021; PVCs present   IMAGING / PROCEDURES: CT CHEST LUNG CANCER SCREENING performed on 02/23/2021 Lung RADS 2, benign appearance or behavior.  Continue annual screening with low-dose chest CT without contrast in 12 months. Aortic atherosclerosis Emphysema Coronary artery atherosclerosis   TRANSTHORACIC ECHOCARDIOGRAM performed on 02/05/2020 LVEF 55 to 60% Left ventricular systolic function normal No regional wall motion abnormalities Left ventricular diastolic parameters consistent with grade 1 diastolic dysfunction (impaired relaxation) Average left ventricular global longitudinal strain is -  15.8% Right ventricular systolic function normal Right ventricular cavity size normal Normal PASP Mitral  valve is myxomatous.  Mild mitral valve regurgitation.  No evidence of mitral valve stenosis Aortic valve is tricuspid.  Aortic valve regurgitation is not visualized.  No evidence of aortic valve stenosis   BILATERAL CAROTID DOPPLER performed on 01/18/2020 Right carotid shows 50-60% stenosis Left carotid shows <50% stenosis There is moderate plaque formation noted on the left and mild to moderate noted on the right Consider repeat carotid Doppler if clinical situation and symptoms warrant in 6 to 12 months. Patient should be encouraged to change lifestyle such as smoking cessation, regular exercise, and dietary modification.  Use of statins in the right clinical setting and ASA is encouraged.  Impression and Plan:  Gabriel Hoffman has been referred for pre-anesthesia review and clearance prior to him undergoing the planned anesthetic and procedural courses. Available labs, pertinent testing, and imaging results were personally reviewed by me. This patient has been appropriately cleared by cardiology with an overall ACCEPTABLE risk of significant perioperative cardiovascular complications.  Based on clinical review performed today (09/29/21), barring any significant acute changes in the patient's overall condition, it is anticipated that he will be able to proceed with the planned surgical intervention. Any acute changes in clinical condition may necessitate his procedure being postponed and/or cancelled. Patient will meet with anesthesia team (MD and/or CRNA) on the day of his procedure for preoperative evaluation/assessment. Questions regarding anesthetic course will be fielded at that time.   Pre-surgical instructions were reviewed with the patient during his PAT appointment and questions were fielded by PAT clinical staff. Patient was advised that if any questions or concerns arise prior to his procedure then he should return a call to PAT and/or his surgeon's office to discuss.  Honor Loh,  MSN, APRN, FNP-C, CEN [facility]  Peri-operative Services Nurse Practitioner Phone: [phone] Fax: [phone] 09/29/21 10:06 AM  NOTE: This note has been prepared using Dragon dictation software. Despite my best ability to proofread, there is always the potential that unintentional transcriptional errors may still occur from this process.

## 2021-09-28 NOTE — Patient Instructions (Signed)
Medication Instructions:  Your physician recommends that you continue on your current medications as directed. Please refer to the Current Medication list given to you today.  *If you need a refill on your cardiac medications before your next appointment, please call your pharmacy*   Lab Work: None ordered If you have labs (blood work) drawn today and your tests are completely normal, you will receive your results only by: MyChart Message (if you have MyChart) OR A paper copy in the mail If you have any lab test that is abnormal or we need to change your treatment, we will call you to review the results.   Testing/Procedures:  Your physician has recommended that you wear a Zio XT monitor for 2 weeks, This will be mailed to you in 3 months.   This monitor is a medical device that records the heart's electrical activity. Doctors most often use these monitors to diagnose arrhythmias. Arrhythmias are problems with the speed or rhythm of the heartbeat. The monitor is a small device applied to your chest. You can wear one while you do your normal daily activities. While wearing this monitor if you have any symptoms to push the button and record what you felt. Once you have worn this monitor for the period of time provider prescribed (Usually 14 days), you will return the monitor device in the postage paid box. Once it is returned they will download the data collected and provide [place] with a report which the provider will then review and we will call you with those results. Important tips:  Avoid showering during the first 24 hours of wearing the monitor. Avoid excessive sweating to help maximize wear time. Do not submerge the device, no hot tubs, and no swimming pools. Keep any lotions or oils away from the patch. After 24 hours you may shower with the patch on. Take brief showers with your back facing the shower head.  Do not remove patch once it has been placed because that will interrupt data and  decrease adhesive wear time. Push the button when you have any symptoms and write down what you were feeling. Once you have completed wearing your monitor, remove and place into box which has postage paid and place in your outgoing mailbox.  If for some reason you have misplaced your box then call our office and we can provide another box and/or mail it off for you.      Follow-Up: At [facility], you and your health needs are our priority.  As part of our continuing mission to provide you with exceptional heart care, we have created designated Provider Care Teams.  These Care Teams include your primary Cardiologist (physician) and Advanced Practice Providers (APPs -  Physician Assistants and Nurse Practitioners) who all work together to provide you with the care you need, when you need it.  We recommend signing up for the patient portal called "MyChart".  Sign up information is provided on this After Visit Summary.  MyChart is used to connect with patients for Virtual Visits (Telemedicine).  Patients are able to view lab/test results, encounter notes, upcoming appointments, etc.  Non-urgent messages can be sent to your provider as well.   To learn more about what you can do with MyChart, go to [email or web address].    Your next appointment:   6 month(s)  The format for your next appointment:   In Person  Provider:   You may see Debbe Odea, MD or one of the following Advanced Practice Providers on  your designated Care Team:   Murray Hodgkins, NP Christell Faith, PA-C Cadence Kathlen Mody, [place]    Other Instructions

## 2021-09-28 NOTE — Progress Notes (Signed)
Cardiology Office Note:    Date:  09/28/2021   ID:  Gabriel Hoffman, DOB 1956-02-25, MRN [id number]  PCP:  Mylinda Latina, PA-C  Cardiologist:  Kate Sable, MD  Electrophysiologist:  None   Referring MD: Mylinda Latina, PA*   Chief Complaint  Patient presents with   New Patient (Initial Visit)   Other    Pre op clearance -- R knee replacement 10/02/2021. Meds reviewed verbally with patient.     History of Present Illness:    Gabriel Hoffman is a 65 y.o. male with a hx of hypertension, hyperlipidemia, atrial fibrillation status post ablation in September 2020, ?prior MI in (Feb 2020), current smoker who presents for preop evaluation.  Patient has osteoarthritis of his right knee, total knee arthroplasty is being planned.  Cardiac conditions include paroxysmal A. fib s/p ablation.  Echocardiogram 01/2021 showed normal ejection fraction, EF 55 to 60%.  Tolerating Toprol-XL and Xarelto. Patient states having occasional palpitations about 2 times a month.  Denies dizziness, syncope.   Prior notes Echocardiogram [id number] normal systolic function, EF 55 to 60%, impaired relaxation, normal LA size.  Patient states having a heart attack in February 2020 while he was incarcerated.  He was taken to [facility] in [facility].  He states being there for 1 week, no procedures were performed and he was placed on meds.  In September of he states being taken to [facility] where he was diagnosed with atrial fibrillation.  He had an ablation and was started on Xarelto.  He recently followed up with a primary care provider in the area to establish care.  His blood pressures were elevated and he was started on hydrochlorothiazide 12.5 mg daily.  He is a current smoker and is working on quitting.    Past Medical History:  Diagnosis Date   Anginal pain ([place])    Anxiety    Aortic atherosclerosis ([place])    Arthritis    Atrial fibrillation ([place])    a.)  CHA2DS2-VASc Score = 2 (HTN, vascular disease).  b.) Rate/rhythm maintained on metoprolol succinate; chronically anticoagulated with rivaroxaban.   CAD (coronary artery disease)    Carotid artery stenosis    Cirrhosis ([place]) [id number]   Complication of anesthesia    a.) seizure like activity with propofol during colonoscopy   COPD (chronic obstructive pulmonary disease) ([place])    Current use of long term anticoagulation    a.) Rivaroxaban   Diastolic dysfunction 0000000   a.) TTE 02/25/2020: normal LV function; EF 55-65%; GLS -15.8%; G1DD.   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 C282Y and S65C were negative.   Hepatitis C 06/15/2020   a.) Tx'd with Harvoni course   HLD (hyperlipidemia)    Hypertension    Hypothyroidism    Lung nodule    Myocardial infarction [facility]) 2019   x2 MI's no stents   Thrombocytopenia [facility])     Past Surgical History:  Procedure Laterality Date   CARDIAC CATHETERIZATION     CATARACT EXTRACTION Bilateral    COLONOSCOPY     FRACTURE SURGERY Left    pins rods left wrist   HARDWARE REMOVAL Right 04/30/2021   Procedure: HARDWARE REMOVAL, Right tibia IM nail removal;  Surgeon: Hessie Knows, MD;  Location: [facility] ORS;  Service: Orthopedics;  Laterality: Right;   HERNIA REPAIR  Bilateral    LEG SURGERY Right    rod in leg   UPPER GI ENDOSCOPY      Current Medications: Current Meds  Medication Sig   ALPRAZolam (XANAX) 0.5 MG tablet Take 1 tablet (0.5 mg total) by mouth at bedtime as needed for anxiety or sleep.   atorvastatin (LIPITOR) 10 MG tablet Take 1 tablet (10 mg total) by mouth daily.   buPROPion (WELLBUTRIN XL) 150 MG 24 hr tablet Take 150 mg by mouth daily.   escitalopram (LEXAPRO) 10 MG tablet Take 1 tablet (10 mg total) by mouth daily.   gabapentin (NEURONTIN) 300 MG capsule Take 300 mg by mouth 3 (three) times  daily.   hydrochlorothiazide (HYDRODIURIL) 25 MG tablet Take 1 tablet (25 mg total) by mouth daily.   Hypromell-Glycerin-Naphazoline (CLEAR EYES FOR DRY EYES PLUS) 0.8-0.25-0.012 % SOLN Place 1-2 drops into both eyes 3 (three) times daily as needed (dry/irritated eyes).   levothyroxine (SYNTHROID) 125 MCG tablet Take 1 tablet (125 mcg total) by mouth daily.   lisinopril (ZESTRIL) 10 MG tablet Take 1 tablet (10 mg total) by mouth daily.   metoprolol succinate (TOPROL-XL) 100 MG 24 hr tablet Take 100 mg by mouth daily.   nitroGLYCERIN (NITROSTAT) 0.4 MG SL tablet Place 0.4 mg under the tongue every 5 (five) minutes x 3 doses as needed for chest pain.   pantoprazole (PROTONIX) 20 MG tablet Take 1 tablet by mouth once daily   rivaroxaban (XARELTO) 20 MG TABS tablet Take 1 tablet (20 mg total) by mouth in the morning.     Allergies:   Codeine and Propofol   Social History   Socioeconomic History   Marital status: Single    Spouse name: Not on file   Number of children: Not on file   Years of education: Not on file   Highest education level: Not on file  Occupational History   Not on file  Tobacco Use   Smoking status: Every Day    Packs/day: 1.00    Years: 50.00    Pack years: 50.00    Types: Cigarettes   Smokeless tobacco: Never   Tobacco comments:    pack last about 3 days now 01-12-2021  Vaping Use   Vaping Use: Never used  Substance and Sexual Activity   Alcohol use: Never    Comment: Quit   Drug use: Never   Sexual activity: Not on file  Other Topics Concern   Not on file  Social History Narrative   Alcohol/beer once/twice a month; 1/2 ppd; used to work in [employer]. In [place]; with sister.    Social Determinants of Health   Financial Resource Strain: Not on file  Food Insecurity: Not on file  Transportation Needs: Not on file  Physical Activity: Not on file  Stress: Not on file  Social Connections: Not on file     Family History: The patient's family  history includes COPD in his mother; Cancer in his mother and sister.  ROS:   Please see the history of present illness.     All other systems reviewed and are negative.  EKGs/Labs/Other Studies Reviewed:    The following studies were reviewed today:   EKG:  EKG is  ordered today.  The ekg ordered today demonstrates normal sinus rhythm, normal ECG.  Recent Labs: 02/17/2021: TSH 8.680 09/21/2021: ALT 30; BUN 22; Creatinine, Ser 1.14; Hemoglobin 15.7; Platelets 103; Potassium 4.0; Sodium 138  Recent Lipid Panel    Component Value Date/Time  CHOL 117 02/17/2021 1136   TRIG 116 02/17/2021 1136   HDL 28 (L) 02/17/2021 1136   LDLCALC 68 02/17/2021 1136    Physical Exam:    VS:  BP 110/60 (BP Location: Left Arm, Patient Position: Sitting, Cuff Size: Normal)   Pulse 72   Ht 5\' 5"  (1.651 m)   Wt 175 lb (79.4 kg)   SpO2 96%   BMI 29.12 kg/m     Wt Readings from Last 3 Encounters:  09/28/21 175 lb (79.4 kg)  09/21/21 175 lb (79.4 kg)  09/17/21 175 lb (79.4 kg)     GEN:  Well nourished, well developed in no acute distress HEENT: Normal NECK: No JVD; No carotid bruits LYMPHATICS: No lymphadenopathy CARDIAC: RRR, no murmurs, rubs, gallops RESPIRATORY:  Clear to auscultation without rales ABDOMEN: Soft, non-tender, non-distended MUSCULOSKELETAL:  No edema; No deformity  SKIN: Warm and dry` NEUROLOGIC:  Alert and oriented x 3 PSYCHIATRIC:  Normal affect   ASSESSMENT:    1. Pre-op evaluation   2. PAF (paroxysmal atrial fibrillation) (HCC)   3. Primary hypertension     PLAN:    In order of problems listed above:  Preop evaluation prior to total knee arthroplasty.  Last echo with normal EF.  Patient with no chest pain.  Clinically asymptomatic from a cardiac perspective.  Okay to proceed with surgery from a cardiac perspective.  Okay to hold Xarelto 48 hours prior to procedure.  Restart Xarelto as soon as possible after. Paroxysmal atrial fibrillation s/p ablation  September 2020 at [facility].  Has occasional palpitations 2 times per month, currently in sinus rhythm.  Continue Toprol-XL, Xarelto.  Place cardiac monitor after knee procedure. Hypertension, BP controlled.  Continue lisinopril, HCTZ, Toprol-XL.   Follow-up in 6 months.   This note was generated in part or whole with voice recognition software. Voice recognition is usually quite accurate but there are transcription errors that can and very often do occur. I apologize for any typographical errors that were not detected and corrected.  Medication Adjustments/Labs and Tests Ordered: Current medicines are reviewed at length with the patient today.  Concerns regarding medicines are outlined above.  Orders Placed This Encounter  Procedures   LONG TERM MONITOR (3-14 DAYS)   EKG 12-Lead    No orders of the defined types were placed in this encounter.   Patient Instructions  Medication Instructions:  Your physician recommends that you continue on your current medications as directed. Please refer to the Current Medication list given to you today.  *If you need a refill on your cardiac medications before your next appointment, please call your pharmacy*   Lab Work: None ordered If you have labs (blood work) drawn today and your tests are completely normal, you will receive your results only by: MyChart Message (if you have MyChart) OR A paper copy in the mail If you have any lab test that is abnormal or we need to change your treatment, we will call you to review the results.   Testing/Procedures:  Your physician has recommended that you wear a Zio XT monitor for 2 weeks, This will be mailed to you in 3 months.   This monitor is a medical device that records the heart's electrical activity. Doctors most often use these monitors to diagnose arrhythmias. Arrhythmias are problems with the speed or rhythm of the heartbeat. The monitor is a small device applied to your chest. You can  wear one while you do your normal daily activities. While  wearing this monitor if you have any symptoms to push the button and record what you felt. Once you have worn this monitor for the period of time provider prescribed (Usually 14 days), you will return the monitor device in the postage paid box. Once it is returned they will download the data collected and provide [place] with a report which the provider will then review and we will call you with those results. Important tips:  Avoid showering during the first 24 hours of wearing the monitor. Avoid excessive sweating to help maximize wear time. Do not submerge the device, no hot tubs, and no swimming pools. Keep any lotions or oils away from the patch. After 24 hours you may shower with the patch on. Take brief showers with your back facing the shower head.  Do not remove patch once it has been placed because that will interrupt data and decrease adhesive wear time. Push the button when you have any symptoms and write down what you were feeling. Once you have completed wearing your monitor, remove and place into box which has postage paid and place in your outgoing mailbox.  If for some reason you have misplaced your box then call our office and we can provide another box and/or mail it off for you.      Follow-Up: At [facility], you and your health needs are our priority.  As part of our continuing mission to provide you with exceptional heart care, we have created designated Provider Care Teams.  These Care Teams include your primary Cardiologist (physician) and Advanced Practice Providers (APPs -  Physician Assistants and Nurse Practitioners) who all work together to provide you with the care you need, when you need it.  We recommend signing up for the patient portal called "MyChart".  Sign up information is provided on this After Visit Summary.  MyChart is used to connect with patients for Virtual Visits (Telemedicine).  Patients are able  to view lab/test results, encounter notes, upcoming appointments, etc.  Non-urgent messages can be sent to your provider as well.   To learn more about what you can do with MyChart, go to [email or web address].    Your next appointment:   6 month(s)  The format for your next appointment:   In Person  Provider:   You may see Kate Sable, MD or one of the following Advanced Practice Providers on your designated Care Team:   Murray Hodgkins, NP Christell Faith, PA-C Cadence Kathlen Mody, [place]    Other Instructions    Signed, Kate Sable, MD  09/28/2021 4:35 PM    [place]

## 2021-09-29 ENCOUNTER — Other Ambulatory Visit
Admission: RE | Admit: 2021-09-29 | Discharge: 2021-09-29 | Disposition: A | Discharge status: Home / Self Care | Payer: Medicaid Other | Source: Ambulatory Visit | Attending: Orthopedic Surgery | Admitting: Orthopedic Surgery

## 2021-09-29 ENCOUNTER — Encounter: Payer: Self-pay | Admitting: Orthopedic Surgery

## 2021-09-29 DIAGNOSIS — Z01812 Encounter for preprocedural laboratory examination: Secondary | ICD-10-CM | POA: Insufficient documentation

## 2021-09-29 DIAGNOSIS — Z20822 Contact with and (suspected) exposure to covid-19: Secondary | ICD-10-CM | POA: Diagnosis not present

## 2021-09-30 LAB — SARS CORONAVIRUS 2 (TAT 6-24 HRS): SARS Coronavirus 2: NEGATIVE

## 2021-10-02 ENCOUNTER — Ambulatory Visit: Payer: Medicaid Other | Admitting: Urgent Care

## 2021-10-02 ENCOUNTER — Observation Stay
Admission: RE | Admit: 2021-10-02 | Discharge: 2021-10-05 | Disposition: A | Discharge status: Home / Self Care | Payer: Medicaid Other | Source: Ambulatory Visit | Attending: Orthopedic Surgery | Admitting: Orthopedic Surgery

## 2021-10-02 ENCOUNTER — Observation Stay: Payer: Medicaid Other

## 2021-10-02 ENCOUNTER — Encounter
Admission: RE | Disposition: A | Discharge status: Home / Self Care | Payer: Self-pay | Source: Ambulatory Visit | Attending: Orthopedic Surgery

## 2021-10-02 ENCOUNTER — Encounter: Payer: Self-pay | Admitting: Orthopedic Surgery

## 2021-10-02 ENCOUNTER — Other Ambulatory Visit: Payer: Self-pay

## 2021-10-02 DIAGNOSIS — E039 Hypothyroidism, unspecified: Secondary | ICD-10-CM | POA: Diagnosis not present

## 2021-10-02 DIAGNOSIS — M1711 Unilateral primary osteoarthritis, right knee: Principal | ICD-10-CM | POA: Insufficient documentation

## 2021-10-02 DIAGNOSIS — Z96651 Presence of right artificial knee joint: Secondary | ICD-10-CM

## 2021-10-02 DIAGNOSIS — Z79899 Other long term (current) drug therapy: Secondary | ICD-10-CM | POA: Diagnosis not present

## 2021-10-02 DIAGNOSIS — I4891 Unspecified atrial fibrillation: Secondary | ICD-10-CM | POA: Insufficient documentation

## 2021-10-02 DIAGNOSIS — I10 Essential (primary) hypertension: Secondary | ICD-10-CM | POA: Insufficient documentation

## 2021-10-02 DIAGNOSIS — M25561 Pain in right knee: Secondary | ICD-10-CM | POA: Diagnosis not present

## 2021-10-02 DIAGNOSIS — G8918 Other acute postprocedural pain: Secondary | ICD-10-CM

## 2021-10-02 HISTORY — DX: Presence of right artificial knee joint: Z96.651

## 2021-10-02 HISTORY — DX: Long term (current) use of anticoagulants: Z79.01

## 2021-10-02 HISTORY — PX: TOTAL KNEE ARTHROPLASTY: SHX125

## 2021-10-02 LAB — ABO/RH: ABO/RH(D): A POS

## 2021-10-02 IMAGING — DX DG KNEE 1-2V*R*
2 series · 2 of 2 positions shown · non-contrast
Comparison: None.

CLINICAL DATA: Total right knee replacement

EXAM:
RIGHT KNEE - 1-2 VIEW

[knee ap]
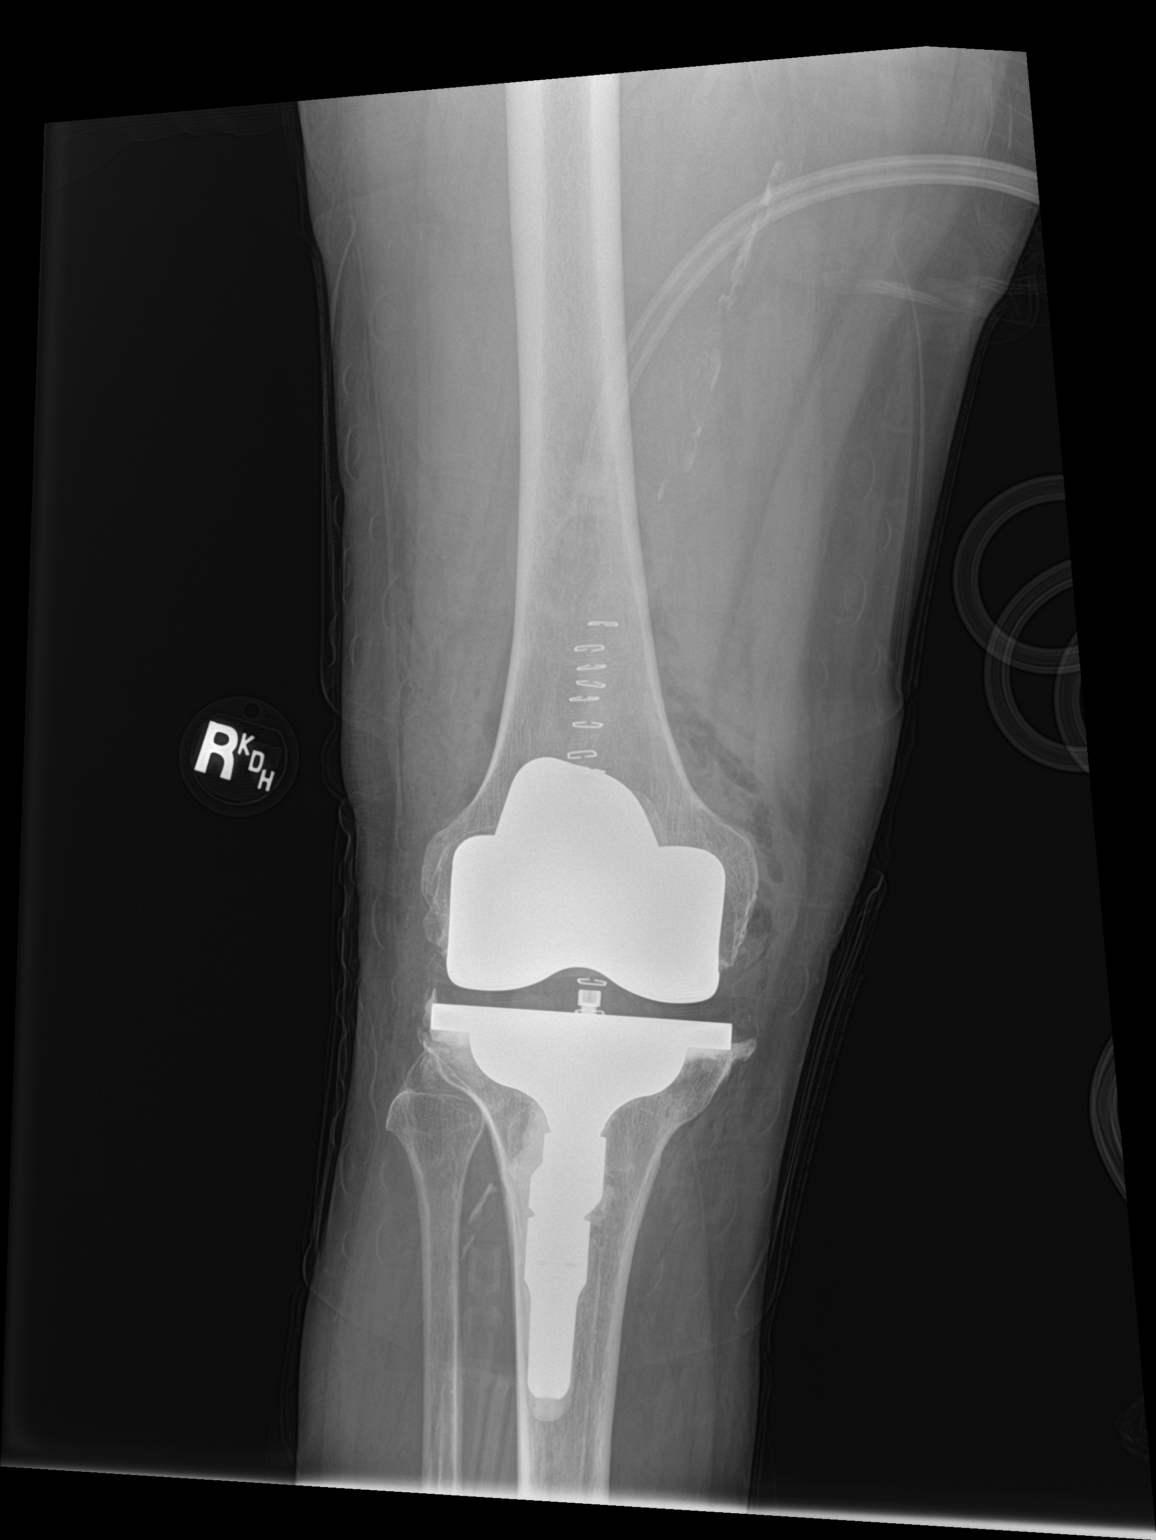

[knee lat]
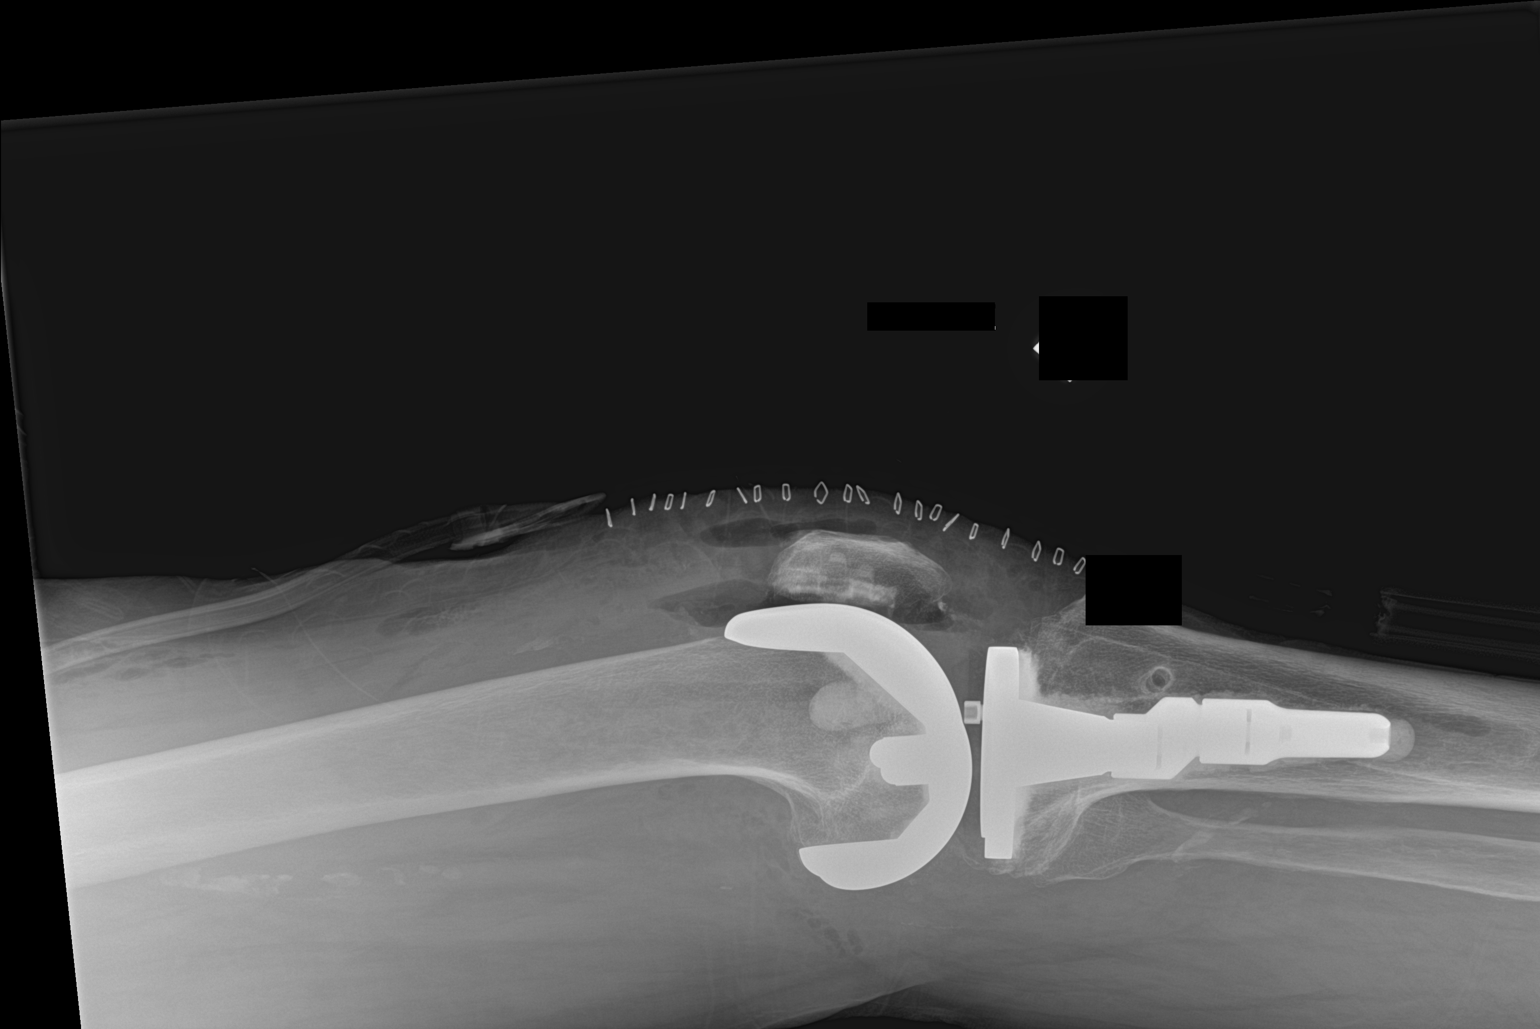

[2 of 2 positions shown; findings below may reference images not displayed]

FINDINGS: Total right knee arthroplasty surgical changes. The hardware appears
aligned and intact. No acute fracture or dislocation visualized.
Subcutaneous emphysema and surgical staples anteriorly.
IMPRESSION: Total right knee arthroplasty surgical changes. No acute fracture
visualized.

## 2021-10-02 SURGERY — ARTHROPLASTY, KNEE, TOTAL
Anesthesia: Spinal | Site: Knee | Laterality: Right

## 2021-10-02 MED ORDER — FENTANYL CITRATE (PF) 100 MCG/2ML IJ SOLN
INTRAMUSCULAR | Status: DC | PRN
  Administered 2021-10-02: 50 ug via INTRAVENOUS

## 2021-10-02 MED ORDER — FENTANYL CITRATE (PF) 100 MCG/2ML IJ SOLN
INTRAMUSCULAR | Status: AC
  Filled 2021-10-02: qty 2

## 2021-10-02 MED ORDER — ACETAMINOPHEN 10 MG/ML IV SOLN
INTRAVENOUS | Status: DC | PRN
  Administered 2021-10-02: 1000 mg via INTRAVENOUS

## 2021-10-02 MED ORDER — BUPIVACAINE LIPOSOME 1.3 % IJ SUSP
INTRAMUSCULAR | Status: AC
  Filled 2021-10-02: qty 20

## 2021-10-02 MED ORDER — ONDANSETRON HCL 4 MG/2ML IJ SOLN: 4.0000 mg | Freq: Once | INTRAMUSCULAR | Status: DC | PRN

## 2021-10-02 MED ORDER — NITROGLYCERIN 0.4 MG SL SUBL: 0.4000 mg | SUBLINGUAL_TABLET | SUBLINGUAL | Status: DC | PRN

## 2021-10-02 MED ORDER — 0.9 % SODIUM CHLORIDE (POUR BTL) OPTIME
TOPICAL | Status: DC | PRN
  Administered 2021-10-02: 1000 mL

## 2021-10-02 MED ORDER — GABAPENTIN 300 MG PO CAPS
300.0000 mg | ORAL_CAPSULE | Freq: Three times a day (TID) | ORAL | Status: DC
  Administered 2021-10-02 – 2021-10-05 (×9): 300 mg via ORAL
  Filled 2021-10-02 (×9): qty 1

## 2021-10-02 MED ORDER — METOCLOPRAMIDE HCL 10 MG PO TABS: 5.0000 mg | ORAL_TABLET | Freq: Three times a day (TID) | ORAL | Status: DC | PRN

## 2021-10-02 MED ORDER — ACETAMINOPHEN 500 MG PO TABS
1000.0000 mg | ORAL_TABLET | Freq: Four times a day (QID) | ORAL | Status: AC
  Filled 2021-10-02 (×2): qty 2

## 2021-10-02 MED ORDER — SODIUM CHLORIDE 0.9 % IV SOLN: INTRAVENOUS | Status: DC

## 2021-10-02 MED ORDER — OXYCODONE HCL 5 MG PO TABS
5.0000 mg | ORAL_TABLET | ORAL | Status: DC | PRN
  Filled 2021-10-02: qty 2

## 2021-10-02 MED ORDER — METOPROLOL SUCCINATE ER 50 MG PO TB24
100.0000 mg | ORAL_TABLET | Freq: Every day | ORAL | Status: DC
  Administered 2021-10-03 – 2021-10-05 (×2): 100 mg via ORAL
  Filled 2021-10-02 (×3): qty 2

## 2021-10-02 MED ORDER — FENTANYL CITRATE (PF) 100 MCG/2ML IJ SOLN: 25.0000 ug | INTRAMUSCULAR | Status: DC | PRN

## 2021-10-02 MED ORDER — BUPIVACAINE-EPINEPHRINE (PF) 0.25% -1:200000 IJ SOLN
INTRAMUSCULAR | Status: AC
  Filled 2021-10-02: qty 30

## 2021-10-02 MED ORDER — CHLORHEXIDINE GLUCONATE 0.12 % MT SOLN
OROMUCOSAL | Status: AC
  Administered 2021-10-02: 15 mL via OROMUCOSAL
  Filled 2021-10-02: qty 15

## 2021-10-02 MED ORDER — CEFAZOLIN SODIUM-DEXTROSE 2-4 GM/100ML-% IV SOLN
2.0000 g | INTRAVENOUS | Status: AC
  Administered 2021-10-02: 2 g via INTRAVENOUS

## 2021-10-02 MED ORDER — ACETAMINOPHEN 325 MG PO TABS
325.0000 mg | ORAL_TABLET | Freq: Four times a day (QID) | ORAL | Status: DC | PRN
  Administered 2021-10-05: 325 mg via ORAL
  Filled 2021-10-02: qty 1

## 2021-10-02 MED ORDER — CEFAZOLIN SODIUM-DEXTROSE 2-4 GM/100ML-% IV SOLN
INTRAVENOUS | Status: AC
  Administered 2021-10-02: 2 g via INTRAVENOUS
  Filled 2021-10-02: qty 100

## 2021-10-02 MED ORDER — MAGNESIUM HYDROXIDE 400 MG/5ML PO SUSP
30.0000 mL | Freq: Every day | ORAL | Status: DC
  Administered 2021-10-02 – 2021-10-04 (×3): 30 mL via ORAL
  Filled 2021-10-02 (×3): qty 30

## 2021-10-02 MED ORDER — MIDAZOLAM HCL 2 MG/2ML IJ SOLN
INTRAMUSCULAR | Status: AC
  Filled 2021-10-02: qty 2

## 2021-10-02 MED ORDER — SODIUM CHLORIDE FLUSH 0.9 % IV SOLN
INTRAVENOUS | Status: AC
  Filled 2021-10-02: qty 40

## 2021-10-02 MED ORDER — HYDROCHLOROTHIAZIDE 25 MG PO TABS
25.0000 mg | ORAL_TABLET | Freq: Every day | ORAL | Status: DC
  Administered 2021-10-03 – 2021-10-05 (×2): 25 mg via ORAL
  Filled 2021-10-02 (×3): qty 1

## 2021-10-02 MED ORDER — MIDAZOLAM HCL 5 MG/5ML IJ SOLN
INTRAMUSCULAR | Status: DC | PRN
  Administered 2021-10-02: 2 mg via INTRAVENOUS

## 2021-10-02 MED ORDER — MENTHOL 3 MG MT LOZG
1.0000 | LOZENGE | OROMUCOSAL | Status: DC | PRN
  Filled 2021-10-02: qty 9

## 2021-10-02 MED ORDER — PHENOL 1.4 % MT LIQD
1.0000 | OROMUCOSAL | Status: DC | PRN
  Filled 2021-10-02: qty 177

## 2021-10-02 MED ORDER — ORAL CARE MOUTH RINSE: 15.0000 mL | Freq: Once | OROMUCOSAL | Status: AC

## 2021-10-02 MED ORDER — BISACODYL 10 MG RE SUPP: 10.0000 mg | Freq: Every day | RECTAL | Status: DC | PRN

## 2021-10-02 MED ORDER — DIPHENHYDRAMINE HCL 12.5 MG/5ML PO ELIX: 12.5000 mg | ORAL_SOLUTION | ORAL | Status: DC | PRN

## 2021-10-02 MED ORDER — DOCUSATE SODIUM 100 MG PO CAPS
100.0000 mg | ORAL_CAPSULE | Freq: Two times a day (BID) | ORAL | Status: DC
  Administered 2021-10-02 – 2021-10-05 (×6): 100 mg via ORAL
  Filled 2021-10-02 (×6): qty 1

## 2021-10-02 MED ORDER — ACETAMINOPHEN 10 MG/ML IV SOLN
INTRAVENOUS | Status: AC
  Filled 2021-10-02: qty 100

## 2021-10-02 MED ORDER — NEOMYCIN-POLYMYXIN B GU 40-200000 IR SOLN
Status: DC | PRN
  Administered 2021-10-02: 14 mL

## 2021-10-02 MED ORDER — ACETAMINOPHEN 10 MG/ML IV SOLN: 1000.0000 mg | Freq: Once | INTRAVENOUS | Status: DC | PRN

## 2021-10-02 MED ORDER — METHOCARBAMOL 1000 MG/10ML IJ SOLN
500.0000 mg | Freq: Four times a day (QID) | INTRAVENOUS | Status: DC | PRN
  Filled 2021-10-02: qty 5

## 2021-10-02 MED ORDER — BUPIVACAINE HCL (PF) 0.5 % IJ SOLN
INTRAMUSCULAR | Status: DC | PRN
  Administered 2021-10-02: 2.5 mL

## 2021-10-02 MED ORDER — NEOMYCIN-POLYMYXIN B GU 40-200000 IR SOLN
Status: AC
  Filled 2021-10-02: qty 20

## 2021-10-02 MED ORDER — LEVOTHYROXINE SODIUM 50 MCG PO TABS
125.0000 ug | ORAL_TABLET | Freq: Every morning | ORAL | Status: DC
  Administered 2021-10-03 – 2021-10-05 (×3): 125 ug via ORAL
  Filled 2021-10-02 (×3): qty 1

## 2021-10-02 MED ORDER — METHOCARBAMOL 500 MG PO TABS
500.0000 mg | ORAL_TABLET | Freq: Four times a day (QID) | ORAL | Status: DC | PRN
  Administered 2021-10-05: 500 mg via ORAL
  Filled 2021-10-02 (×2): qty 1

## 2021-10-02 MED ORDER — PROPOFOL 500 MG/50ML IV EMUL
INTRAVENOUS | Status: DC | PRN
  Administered 2021-10-02: 200 ug/kg/min via INTRAVENOUS

## 2021-10-02 MED ORDER — POLYETHYLENE GLYCOL 3350 17 G PO PACK: 17.0000 g | PACK | Freq: Every day | ORAL | Status: DC | PRN

## 2021-10-02 MED ORDER — ONDANSETRON HCL 4 MG/2ML IJ SOLN: 4.0000 mg | Freq: Four times a day (QID) | INTRAMUSCULAR | Status: DC | PRN

## 2021-10-02 MED ORDER — METOCLOPRAMIDE HCL 5 MG/ML IJ SOLN: 5.0000 mg | Freq: Three times a day (TID) | INTRAMUSCULAR | Status: DC | PRN

## 2021-10-02 MED ORDER — TRAMADOL HCL 50 MG PO TABS
50.0000 mg | ORAL_TABLET | Freq: Four times a day (QID) | ORAL | Status: DC
  Administered 2021-10-02 – 2021-10-04 (×10): 50 mg via ORAL
  Filled 2021-10-02 (×10): qty 1

## 2021-10-02 MED ORDER — ATORVASTATIN CALCIUM 10 MG PO TABS
10.0000 mg | ORAL_TABLET | Freq: Every day | ORAL | Status: DC
  Administered 2021-10-03 – 2021-10-05 (×3): 10 mg via ORAL
  Filled 2021-10-02 (×3): qty 1

## 2021-10-02 MED ORDER — LACTATED RINGERS IV SOLN: INTRAVENOUS | Status: DC

## 2021-10-02 MED ORDER — MORPHINE SULFATE (PF) 10 MG/ML IV SOLN
INTRAVENOUS | Status: AC
  Filled 2021-10-02: qty 1

## 2021-10-02 MED ORDER — ZOLPIDEM TARTRATE 5 MG PO TABS: 5.0000 mg | ORAL_TABLET | Freq: Every evening | ORAL | Status: DC | PRN

## 2021-10-02 MED ORDER — ONDANSETRON HCL 4 MG PO TABS: 4.0000 mg | ORAL_TABLET | Freq: Four times a day (QID) | ORAL | Status: DC | PRN

## 2021-10-02 MED ORDER — HYDROMORPHONE HCL 1 MG/ML IJ SOLN
0.5000 mg | INTRAMUSCULAR | Status: DC | PRN
  Administered 2021-10-02 – 2021-10-03 (×3): 1 mg via INTRAVENOUS
  Filled 2021-10-02 (×3): qty 1

## 2021-10-02 MED ORDER — CEFAZOLIN SODIUM-DEXTROSE 2-4 GM/100ML-% IV SOLN
2.0000 g | Freq: Four times a day (QID) | INTRAVENOUS | Status: AC
  Administered 2021-10-02: 2 g via INTRAVENOUS
  Filled 2021-10-02 (×2): qty 100

## 2021-10-02 MED ORDER — LISINOPRIL 10 MG PO TABS
10.0000 mg | ORAL_TABLET | Freq: Every day | ORAL | Status: DC
  Administered 2021-10-03 – 2021-10-05 (×2): 10 mg via ORAL
  Filled 2021-10-02 (×3): qty 1

## 2021-10-02 MED ORDER — PRONTOSAN WOUND IRRIGATION OPTIME
TOPICAL | Status: DC | PRN
  Administered 2021-10-02: 1

## 2021-10-02 MED ORDER — OXYCODONE HCL 5 MG PO TABS
10.0000 mg | ORAL_TABLET | ORAL | Status: DC | PRN
  Administered 2021-10-02 – 2021-10-04 (×4): 15 mg via ORAL
  Administered 2021-10-04: 10 mg via ORAL
  Administered 2021-10-05 (×3): 15 mg via ORAL
  Filled 2021-10-02 (×8): qty 3

## 2021-10-02 MED ORDER — RIVAROXABAN 20 MG PO TABS
20.0000 mg | ORAL_TABLET | Freq: Every morning | ORAL | Status: DC
  Administered 2021-10-03 – 2021-10-05 (×3): 20 mg via ORAL
  Filled 2021-10-02 (×3): qty 1

## 2021-10-02 MED ORDER — FLEET ENEMA 7-19 GM/118ML RE ENEM: 1.0000 | ENEMA | Freq: Once | RECTAL | Status: DC | PRN

## 2021-10-02 MED ORDER — ALPRAZOLAM 0.5 MG PO TABS
0.5000 mg | ORAL_TABLET | Freq: Every evening | ORAL | Status: DC | PRN
  Administered 2021-10-03 – 2021-10-04 (×2): 0.5 mg via ORAL
  Filled 2021-10-02 (×2): qty 1

## 2021-10-02 MED ORDER — CHLORHEXIDINE GLUCONATE 0.12 % MT SOLN: 15.0000 mL | Freq: Once | OROMUCOSAL | Status: AC

## 2021-10-02 MED ORDER — PANTOPRAZOLE SODIUM 20 MG PO TBEC
20.0000 mg | DELAYED_RELEASE_TABLET | Freq: Every day | ORAL | Status: DC
  Administered 2021-10-03 – 2021-10-05 (×3): 20 mg via ORAL
  Filled 2021-10-02 (×3): qty 1

## 2021-10-02 MED ORDER — SODIUM CHLORIDE (PF) 0.9 % IJ SOLN
INTRAMUSCULAR | Status: DC | PRN
  Administered 2021-10-02: 91 mL via INTRAMUSCULAR

## 2021-10-02 MED ORDER — SODIUM CHLORIDE 0.9 % IR SOLN
Status: DC | PRN
  Administered 2021-10-02: 3000 mL

## 2021-10-02 MED ORDER — PHENYLEPHRINE HCL-NACL 20-0.9 MG/250ML-% IV SOLN
INTRAVENOUS | Status: DC | PRN
  Administered 2021-10-02: 40 ug/min via INTRAVENOUS

## 2021-10-02 SURGICAL SUPPLY — 68 items
ADAPTER OFFSET 5 (Connector) ×2 IMPLANT
BLADE SAGITTAL 25.0X1.19X90 (BLADE) ×2 IMPLANT
BLADE SAW 90X13X1.19 OSCILLAT (BLADE) ×2 IMPLANT
BNDG ELASTIC 6X5.8 VLCR STR LF (GAUZE/BANDAGES/DRESSINGS) ×2 IMPLANT
CANISTER WOUND CARE 500ML ATS (WOUND CARE) ×2 IMPLANT
CEMENT FEMORAL COMP SZ 4+ RT (Femur) ×2 IMPLANT
CEMENT HV SMART SET (Cement) ×4 IMPLANT
CHLORAPREP W/TINT 26 (MISCELLANEOUS) ×4 IMPLANT
COOLER POLAR GLACIER W/PUMP (MISCELLANEOUS) ×2 IMPLANT
CUFF TOURN SGL QUICK 24 (TOURNIQUET CUFF)
CUFF TOURN SGL QUICK 34 (TOURNIQUET CUFF)
CUFF TRNQT CYL 24X4X16.5-23 (TOURNIQUET CUFF) IMPLANT
CUFF TRNQT CYL 34X4.125X (TOURNIQUET CUFF) IMPLANT
DRAPE 3/4 80X56 (DRAPES) ×4 IMPLANT
DRSG MEPILEX SACRM 8.7X9.8 (GAUZE/BANDAGES/DRESSINGS) ×2 IMPLANT
ELECT CAUTERY BLADE 6.4 (BLADE) ×2 IMPLANT
ELECT REM PT RETURN 9FT ADLT (ELECTROSURGICAL) ×2
ELECTRODE REM PT RTRN 9FT ADLT (ELECTROSURGICAL) ×1 IMPLANT
GAUZE 4X4 16PLY ~~LOC~~+RFID DBL (SPONGE) ×2 IMPLANT
GAUZE SPONGE 4X4 12PLY STRL (GAUZE/BANDAGES/DRESSINGS) ×2 IMPLANT
GAUZE XEROFORM 1X8 LF (GAUZE/BANDAGES/DRESSINGS) ×2 IMPLANT
GLOVE SURG ORTHO LTX SZ8 (GLOVE) ×2 IMPLANT
GLOVE SURG SYN 9.0  PF PI (GLOVE) ×1
GLOVE SURG SYN 9.0 PF PI (GLOVE) ×1 IMPLANT
GLOVE SURG UNDER LTX SZ8 (GLOVE) ×2 IMPLANT
GLOVE SURG UNDER POLY LF SZ9 (GLOVE) ×2 IMPLANT
GOWN SRG 2XL LVL 4 RGLN SLV (GOWNS) ×1 IMPLANT
GOWN STRL NON-REIN 2XL LVL4 (GOWNS) ×1
GOWN STRL REUS W/ TWL LRG LVL3 (GOWN DISPOSABLE) ×1 IMPLANT
GOWN STRL REUS W/ TWL XL LVL3 (GOWN DISPOSABLE) ×1 IMPLANT
GOWN STRL REUS W/TWL LRG LVL3 (GOWN DISPOSABLE) ×1
GOWN STRL REUS W/TWL XL LVL3 (GOWN DISPOSABLE) ×1
HOLDER FOLEY CATH W/STRAP (MISCELLANEOUS) ×2 IMPLANT
INSERT TIBIAL SZ4 RIGHT 10MM (Insert) ×2 IMPLANT
IV NS IRRIG 3000ML ARTHROMATIC (IV SOLUTION) ×2 IMPLANT
KIT PREVENA INCISION MGT20CM45 (CANNISTER) ×2 IMPLANT
KIT TURNOVER KIT A (KITS) ×2 IMPLANT
MANIFOLD NEPTUNE II (INSTRUMENTS) ×4 IMPLANT
NDL SAFETY ECLIPSE 18X1.5 (NEEDLE) ×1 IMPLANT
NEEDLE HYPO 18GX1.5 SHARP (NEEDLE) ×1
NEEDLE SPNL 18GX3.5 QUINCKE PK (NEEDLE) ×2 IMPLANT
NEEDLE SPNL 20GX3.5 QUINCKE YW (NEEDLE) ×2 IMPLANT
NS IRRIG 1000ML POUR BTL (IV SOLUTION) ×2 IMPLANT
PACK TOTAL KNEE (MISCELLANEOUS) ×2 IMPLANT
PAD WRAPON POLAR KNEE (MISCELLANEOUS) ×1 IMPLANT
PATELLA RESURFACING MEDACTA 02 (Bone Implant) ×2 IMPLANT
PENCIL SMOKE EVACUATOR COATED (MISCELLANEOUS) ×2 IMPLANT
PULSAVAC PLUS IRRIG FAN TIP (DISPOSABLE) ×2
SCALPEL PROTECTED #10 DISP (BLADE) ×4 IMPLANT
SOLUTION PRONTOSAN WOUND 350ML (IRRIGATION / IRRIGATOR) ×2 IMPLANT
SPONGE T-LAP 18X18 ~~LOC~~+RFID (SPONGE) ×6 IMPLANT
STAPLER SKIN PROX 35W (STAPLE) ×2 IMPLANT
STEM EXTENSION 11MMX30MM (Stem) ×2 IMPLANT
SUCTION FRAZIER HANDLE 10FR (MISCELLANEOUS) ×1
SUCTION TUBE FRAZIER 10FR DISP (MISCELLANEOUS) ×1 IMPLANT
SUT DVC 2 QUILL PDO  T11 36X36 (SUTURE) ×1
SUT DVC 2 QUILL PDO T11 36X36 (SUTURE) ×1 IMPLANT
SUT ETHIBOND 2 V 37 (SUTURE) ×2 IMPLANT
SUT V-LOC 90 ABS DVC 3-0 CL (SUTURE) ×2 IMPLANT
SYR 20ML LL LF (SYRINGE) ×2 IMPLANT
SYR 50ML LL SCALE MARK (SYRINGE) ×4 IMPLANT
TIBIAL TRAY FIXED 4 MEDACTA 02 (Joint) ×2 IMPLANT
TIP FAN IRRIG PULSAVAC PLUS (DISPOSABLE) ×1 IMPLANT
TOWEL OR 17X26 4PK STRL BLUE (TOWEL DISPOSABLE) ×2 IMPLANT
TOWER CARTRIDGE SMART MIX (DISPOSABLE) ×2 IMPLANT
TRAY FOLEY MTR SLVR 16FR STAT (SET/KITS/TRAYS/PACK) ×2 IMPLANT
WATER STERILE IRR 500ML POUR (IV SOLUTION) ×2 IMPLANT
WRAPON POLAR PAD KNEE (MISCELLANEOUS) ×2

## 2021-10-02 NOTE — Anesthesia Procedure Notes (Signed)
Spinal  Patient location during procedure: OR Start time: 10/02/2021 1:00 PM End time: 10/02/2021 1:12 PM Reason for block: surgical anesthesia Staffing Performed: anesthesiologist  Anesthesiologist: Arita Miss, MD Preanesthetic Checklist Completed: patient identified, IV checked, site marked, risks and benefits discussed, surgical consent, monitors and equipment checked, pre-op evaluation and timeout performed Spinal Block Patient position: sitting Prep: ChloraPrep Patient monitoring: heart rate, continuous pulse ox, blood pressure and cardiac monitor Approach: midline Location: L3-4 Injection technique: single-shot Needle Needle type: Quincke  Needle gauge: 22 G Needle length: 9 cm Assessment Sensory level: T10 Events: CSF return Additional Notes Attempt by CRNA. Successful placement by MD. Negative paresthesia. Negative blood return. Positive free-flowing CSF. Expiration date of kit checked and confirmed. Patient tolerated procedure well, without complications.

## 2021-10-02 NOTE — Anesthesia Preprocedure Evaluation (Addendum)
Anesthesia Evaluation  Patient identified by MRN, date of birth, ID band Patient awake  General Assessment Comment:  Has listed reaction to propofol as "seizure like activity". Circumstances as follows: [facility], was told he had this reaction after propofol administration and they cancelled the procedure. He subsequently had a colonoscopy at [facility] (records unavailable) with no complications.  Reviewed: Allergy & Precautions, NPO status , Patient's Chart, lab work & pertinent test results  History of Anesthesia Complications (+) history of anesthetic complications  Airway Mallampati: II  TM Distance: >3 FB Neck ROM: Full    Dental  (+) Edentulous Upper, Edentulous Lower   Pulmonary neg sleep apnea, COPD, Current Smoker and Patient abstained from smoking.,    Pulmonary exam normal breath sounds clear to auscultation       Cardiovascular Exercise Tolerance: Good METShypertension, + CAD and + Past MI  (-) dysrhythmias  Rhythm:Regular Rate:Normal - Systolic murmurs TRANSTHORACIC ECHOCARDIOGRAM performed on 02/05/2020 1. LVEF 55 to 60% 2. Left ventricular systolic function normal 3. No regional wall motion abnormalities 4. Left ventricular diastolic parameters consistent with grade 1 diastolic dysfunction (impaired relaxation) 5. Average left ventricular global longitudinal strain is -15.8% 6. Right ventricular systolic function normal 7. Right ventricular cavity size normal 8. Normal PASP 9. Mitral valve is myxomatous. Mild mitral valve regurgitation. No evidence of mitral valve stenosis 10. Aortic valve is tricuspid. Aortic valve regurgitation is not visualized. No evidence of aortic valve stenosis   Neuro/Psych PSYCHIATRIC DISORDERS Anxiety negative neurological ROS  negative psych ROS   GI/Hepatic GERD  Medicated,(+)     (-) substance abuse  , Hepatitis -, CS/p Harvoni   Endo/Other   neg diabetesHypothyroidism   Renal/GU negative Renal ROS     Musculoskeletal  (+) Arthritis ,   Abdominal   Peds  Hematology   Anesthesia Other Findings Past Medical History: No date: Anginal pain (HCC) No date: Anxiety No date: Aortic atherosclerosis (HCC) No date: Arthritis No date: Atrial fibrillation (HCC)     Comment:  a.) CHA2DS2-VASc Score = 2 (HTN, vascular disease/prior               MI).  b.) Rate/rhythm maintained on metoprolol succinate;              chronically anticoagulated with rivaroxaban. No date: CAD (coronary artery disease) No date: Carotid artery stenosis 06/15/2020: Cirrhosis (HCC) No date: Complication of anesthesia     Comment:  a.) seizure like activity with propofol during               colonoscopy No date: COPD (chronic obstructive pulmonary disease) (HCC) No date: Current use of long term anticoagulation     Comment:  a.) Rivaroxaban 02/25/2020: Diastolic dysfunction     Comment:  a.) TTE 02/25/2020: normal LV function; EF 55-65%; GLS               -15.8%; G1DD. 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 C282Y               and S65C were negative. 06/15/2020: Hepatitis C     Comment:  a.) Tx'd with Harvoni course No date: HLD (hyperlipidemia) No date: Hypertension No date: Hypothyroidism No date: Lung nodule  2019: Myocardial infarction [facility])     Comment:  x2 MI's no stents No date: Thrombocytopenia ([place])  Reproductive/Obstetrics                           Anesthesia Physical Anesthesia Plan  ASA: 3  Anesthesia Plan: Spinal   Post-op Pain Management: Ofirmev IV (intra-op)   Induction: Intravenous  PONV Risk Score and Plan: 1 and Ondansetron, Dexamethasone, Propofol infusion, TIVA and Midazolam  Airway Management Planned: Natural  Airway  Additional Equipment: None  Intra-op Plan:   Post-operative Plan:   Informed Consent: I have reviewed the patients History and Physical, chart, labs and discussed the procedure including the risks, benefits and alternatives for the proposed anesthesia with the patient or authorized representative who has indicated his/her understanding and acceptance.       Plan Discussed with: CRNA and Surgeon  Anesthesia Plan Comments: (I discussed with patient like erroneous labelling of propofol reaction as "allergy." described occasional myoclonus present after propofol administration. I did say that propofol is in fact given to TREAT seizures. I said it was extremely unlikely that he had a propofol reaction. He understands and agrees to proceed with propofol.  Discussed R/B/A of neuraxial anesthesia technique with patient: - rare risks of spinal/epidural hematoma, nerve damage, infection - Risk of PDPH - Risk of nausea and vomiting - Risk of conversion to general anesthesia and its associated risks, including sore throat, damage to lips/teeth/oropharynx, and rare risks such as cardiac and respiratory events. - Risk of allergic reactions  Discussed the role of CRNA in patient's perioperative care.  Patient voiced understanding.)        Anesthesia Quick Evaluation

## 2021-10-02 NOTE — Evaluation (Signed)
Physical Therapy Evaluation Patient Details Name: Gabriel Hoffman MRN: [id number] DOB: May 15, 1956 Today's Date: 10/02/2021  History of Present Illness  Pt is a 65 y.o. male with PMH including a fib, depression, HTN, HLD, hypotyroidism, R Tibia IM mail removal 04/30/2021, ORIF distal radius fx Left, COPD, MI x2, anxiety, and CAD.  Presented with dx of primary localized osteoarthritis of right knee and is s/p R TKA.   Clinical Impression  Pt was pleasant and motivated to participate during the session and put forth good effort throughout. Pt was able to complete all ther ex in bed and EOB. Pt was able to complete bed mobility with supervision and no increase in pain. Pt required min guard for safety with STS and did not report any feelings of nausea or dizziness. Pt was able to complete forward and backward steps at EOB with min guard for safety. SpO2 and HR remained WNL for the entire session. Pt will benefit from HHPT upon discharge to safely address deficits listed in patient problem list for decreased caregiver assistance and eventual return to PLOF.   Recommendations for follow up therapy are one component of a multi-disciplinary discharge planning process, led by the attending physician.  Recommendations may be updated based on patient status, additional functional criteria and insurance authorization.  Follow Up Recommendations Home health PT    Assistance Recommended at Discharge Intermittent Supervision/Assistance  Functional Status Assessment Patient has had a recent decline in their functional status and demonstrates the ability to make significant improvements in function in a reasonable and predictable amount of time.  Equipment Recommendations  Rolling walker (2 wheels)    Recommendations for Other Services       Precautions / Restrictions Precautions Precautions: Knee Precaution Booklet Issued: Yes (comment) (Left in room) Precaution Comments: in bone foam when in bed or not  in CPM Restrictions Weight Bearing Restrictions: Yes RLE Weight Bearing: Weight bearing as tolerated      Mobility  Bed Mobility Overal bed mobility: Needs Assistance Bed Mobility: Supine to Sit     Supine to sit: Supervision     General bed mobility comments: Increased time and effort, easily performed bed mobility with supervision    Transfers Overall transfer level: Needs assistance Equipment used: Rolling walker (2 wheels) Transfers: Sit to/from Stand Sit to Stand: Min guard           General transfer comment: Min guard for safety, increased time and effort, no adverse symtoms    Ambulation/Gait Ambulation/Gait assistance: Min guard Gait Distance (Feet): 15 Feet x2 Assistive device: Rolling walker (2 wheels) Gait Pattern/deviations: Step-to pattern;Decreased step length - right;Decreased step length - left;Decreased stride length;Decreased weight shift to right Gait velocity: decreased     General Gait Details: Increased time and effort, min guard for safety, demonstrated step to pattern and decreased WB on RLE  Stairs            Wheelchair Mobility    Modified Rankin (Stroke Patients Only)       Balance Overall balance assessment: Needs assistance Sitting-balance support: Bilateral upper extremity supported;Feet supported Sitting balance-Leahy Scale: Good     Standing balance support: Bilateral upper extremity supported;During functional activity Standing balance-Leahy Scale: Good                               Pertinent Vitals/Pain Pain Assessment: 0-10 Pain Score: 10-Worst pain ever Pain Location: R knee Pain Descriptors / Indicators: Aching;Sore;Discomfort Pain  Intervention(s): Limited activity within patient's tolerance;Repositioned;Ice applied    Home Living Family/patient expects to be discharged to:: Private residence Living Arrangements: Spouse/significant other (girlfriend) Available Help at Discharge:  Friend(s);Available 24 hours/day (girlfriend) Type of Home: House Home Access: Stairs to enter Entrance Stairs-Rails: Can reach both;Right;Left Entrance Stairs-Number of Steps: 2   Home Layout: One level Home Equipment: Grab bars - toilet;Grab bars - tub/shower      Prior Function Prior Level of Function : Independent/Modified Independent             Mobility Comments: Pt reports being independent with mobility, no AD, community ambulator, Surveyor, mining, 2 falls in the past year but not in the past 6 months ADLs Comments: Pt reports being independent with ADLs     Hand Dominance        Extremity/Trunk Assessment   Upper Extremity Assessment Upper Extremity Assessment: Generalized weakness    Lower Extremity Assessment Lower Extremity Assessment: Generalized weakness;RLE deficits/detail RLE Deficits / Details: s/p R TKA       Communication   Communication: No difficulties  Cognition Arousal/Alertness: Awake/alert Behavior During Therapy: WFL for tasks assessed/performed Overall Cognitive Status: Within Functional Limits for tasks assessed                                          General Comments      Exercises Total Joint Exercises Ankle Circles/Pumps: AROM;Strengthening;Both;10 reps;Supine Quad Sets: AROM;Strengthening;Both;10 reps;Supine Gluteal Sets: AROM;Strengthening;Both;10 reps;Supine Long Arc Quad: AROM;Strengthening;Both;10 reps;Seated Goniometric ROM: R knee AROM 1-82 Marching in Standing: AROM;Strengthening;Both;10 reps;Standing Other Exercises Other Exercises: Pt education on HEP of APs, QSs, and GSs 10-20x every hour Other Exercises: Pt education on bone foam and optimal limb positioning   Assessment/Plan    PT Assessment Patient needs continued PT services  PT Problem List Decreased strength;Decreased range of motion;Decreased activity tolerance;Decreased balance;Decreased mobility;Decreased knowledge of use of DME;Decreased  safety awareness;Decreased knowledge of precautions;Pain       PT Treatment Interventions DME instruction;Gait training;Stair training;Functional mobility training;Therapeutic activities;Therapeutic exercise;Balance training;Patient/family education    PT Goals (Current goals can be found in the Care Plan section)  Acute Rehab PT Goals 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 PT Goal Formulation: With patient Time For Goal Achievement: 10/15/21 Potential to Achieve Goals: Good    Frequency BID   Barriers to discharge        Co-evaluation               AM-PAC PT "6 Clicks" Mobility  Outcome Measure Help needed turning from your back to your side while in a flat bed without using bedrails?: A Little Help needed moving from lying on your back to sitting on the side of a flat bed without using bedrails?: A Little Help needed moving to and from a bed to a chair (including a wheelchair)?: A Little Help needed standing up from a chair using your arms (e.g., wheelchair or bedside chair)?: A Little Help needed to walk in hospital room?: A Little Help needed climbing 3-5 steps with a railing? : A Little 6 Click Score: 18    End of Session Equipment Utilized During Treatment: Gait belt Activity Tolerance: Patient tolerated treatment well Patient left: in chair;with call bell/phone within reach;with chair alarm set;with SCD's reapplied;Other (comment) (with polar reapplied) Nurse Communication: Mobility status (Needs call bell) PT Visit Diagnosis: Other abnormalities of gait and mobility (  R26.89);Muscle weakness (generalized) (M62.81);Pain;Unsteadiness on feet (R26.81) Pain - Right/Left: Right Pain - part of body: Knee    Time: [id number] PT Time Calculation (min) (ACUTE ONLY): 44 min   Charges:              Sheldon Silvan SPT 10/02/21, 5:35 PM

## 2021-10-02 NOTE — TOC Initial Note (Signed)
Transition of Care [facility]) - Initial/Assessment Note    Patient Details  Name: Gabriel Hoffman MRN: [id number] Date of Birth: 01-25-1956  [facility]) CM/SW Contact:    Marlowe Sax, RN Phone Number: 10/02/2021, 4:38 PM  Clinical Narrative:    TOC to follow and assist the patient with DC planning and needs [facility] has declined accepting the patient for [facility]   PT to evaluate the patient and make recommendation, TOC to continue to follow and assist                     Patient Goals and CMS Choice        Expected Discharge Plan and Services                                                Prior Living Arrangements/Services                       Activities of Daily Living      Permission Sought/Granted                  Emotional Assessment              Admission diagnosis:  S/P TKR (total knee replacement) using cement, right [Z96.651] Patient Active Problem List   Diagnosis Date Noted   S/P TKR (total knee replacement) using cement, right 10/02/2021   Hemochromatosis, hereditary (HCC) 07/25/2020   Leg cramps 05/19/2020   Acute pain of right knee 03/21/2020   Encounter for general adult medical examination with abnormal findings 02/25/2020   Chronic active hepatitis (HCC) 02/25/2020   Dysuria 02/25/2020   Abnormal liver function 01/30/2020   History of hepatitis C 01/30/2020   Acquired hypothyroidism 01/30/2020   Essential hypertension 01/04/2020   Coronary artery disease due to lipid rich plaque 01/04/2020   Mixed hyperlipidemia 01/04/2020   Generalized anxiety disorder 01/04/2020   PCP:  Carlean Jews, PA-C Pharmacy:   [facility] - [place], [place] - [address] [place] [address] Phone: [phone] Fax: [phone]     Social Determinants of Health (SDOH) Interventions    Readmission Risk Interventions No flowsheet data found.

## 2021-10-02 NOTE — Transfer of Care (Signed)
Immediate Anesthesia Transfer of Care Note  Patient: Gabriel Hoffman  Procedure(s) Performed: TOTAL KNEE ARTHROPLASTY (Right: Knee)  Patient Location: PACU  Anesthesia Type:Spinal  Level of Consciousness: awake, alert  and oriented  Airway & Oxygen Therapy: Patient Spontanous Breathing and Patient connected to face mask oxygen  Post-op Assessment: Report given to RN and Post -op Vital signs reviewed and stable  Post vital signs: Reviewed and stable  Last Vitals:  Vitals Value Taken Time  BP    Temp    Pulse 96 10/02/21 1506  Resp 28 10/02/21 1506  SpO2 95 % 10/02/21 1506  Vitals shown include unvalidated device data.  Last Pain:  Vitals:   10/02/21 1015  TempSrc: Temporal  PainSc: 0-No pain         Complications: No notable events documented.

## 2021-10-02 NOTE — Plan of Care (Signed)
Pt arrived from PACU. Right total knee. A&O x 4. Polarcare and woundvac in place. Physical therapy in room to work with patient. Voiced no complaint of pain. Nurse will continue to monitor patient

## 2021-10-02 NOTE — Op Note (Signed)
10/02/2021  3:06 PM  PATIENT:  Gabriel Hoffman   MRN: [id number]  PRE-OPERATIVE DIAGNOSIS:  Primary localized osteoarthritis of right knee   POST-OPERATIVE DIAGNOSIS:  Same   PROCEDURE:  Procedure(s): Right TOTAL KNEE ARTHROPLASTY   SURGEON: Leitha Schuller, MD   ASSISTANTS: None   ANESTHESIA:   spinal   EBL: 100 cc   BLOOD ADMINISTERED:none   DRAINS:  Incisional wound VAC     LOCAL MEDICATIONS USED:  MARCAINE    and OTHER Exparel and morphine   SPECIMEN:  No Specimen   DISPOSITION OF SPECIMEN:  N/A   COUNTS:  YES   TOURNIQUET: 78 minutes at 300 mm Hg   IMPLANTS: Medacta  GMK sphere system with 4+ right femur, 4 tibia with short stem 5 mm offset, and 10 mm insert.  Size 2 patella, all components cemented.   DICTATION: Reubin Milan Dictation   patient was brought to the operating room and spinal anesthesia was obtained.  After prepping and draping the right leg in sterile fashion, and after patient identification and timeout procedures were completed, tourniquet was raised  and midline skin incision was made followed by medial parapatellar arthrotomy with severe medial compartment osteoarthritis, severe patellofemoral arthritis and advanced lateral compartment arthritis, partial synovectomy was also carried out.   The ACL was already deficient and the PCL and fat pad were excised along with anterior horns of the meniscus. The proximal tibia cutting guide from  the extra medullary system was applied and the proximal tibia cut carried out.  The distal femoral cut was carried out in a similar fashion with intramedullary guide     the 5 femoral cutting guide applied with anterior posterior and chamfer cuts made.  Subsequently this was felt to be oversized and was downsized to a 4+.  The posterior horns of the menisci were removed at this point.   Injection of the above medication was carried out after the femoral and tibial cuts were carried out.  The 4 baseplate trial was placed pinned  into position and proximal tibial preparation carried out with drilling hand reaming and the keel punch with offset because of proximal tibia deformity followed by placement of the 4+ femur and sizing the tibial insert size 10 millimeter gave the best fit with stability and full extension.  The distal femoral drill holes were made in the notch cut for the trochlear groove was then carried out with trials were then removed the patella was cut using the patellar cutting guide and it sized to a size 2after drill holes have been made  The knee was irrigated with pulsatile lavage and the bony surfaces dried the tibial component was cemented into place first.  Excess cement was removed and the polyethylene insert placed with a torque screw placed with a torque screwdriver tightened.  The distal femoral component was placed and the knee was held in extension as the patellar button was clamped into place.  After the cement was set, excess cement was removed and the knee was again irrigated thoroughly thoroughly irrigated.  The tourniquet was let down and hemostasis checked with electrocautery. The arthrotomy was repaired with a heavy Quill suture,  followed by 3-0 V lock subcuticular closure, skin staples followed by incisional wound VAC and Polar Care.Marland Kitchen   PLAN OF CARE: Admit for overnight observation   PATIENT DISPOSITION:  PACU - hemodynamically stable.

## 2021-10-02 NOTE — H&P (Signed)
Chief Complaint  Patient presents with   Pre-op Exam  Right TKA scheduled 10/02/21    History of the Present Illness: Gabriel Hoffman is a 65 y.o. male here today for history and physical right total knee arthroplasty with Dr. Hessie Knows on 10/02/2021. 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 Underwent right knee tibial rod removal 04/02/2021 and has healed well from this. Patient has advanced right knee osteoarthritis that interferes with quality of life and activities of daily living. For several years, patient's knee has been giving away and buckling. He will have intermittent swelling. He describes a constant aching pain throughout the knee mostly along the medial joint line but also posteriorly. He has a hard time standing after sitting for long periods of time. He is tried over-the-counter medications with no relief. He has had injections with no relief. Pain is severe and interferes with quality of life and activities daily living.  I have reviewed past medical, surgical, social and family history, and allergies as documented in the EMR.  Past Medical History: Past Medical History:  Diagnosis Date   Atrial fibrillation (CMS-HCC)   Depression   GERD (gastroesophageal reflux disease)   Hyperlipidemia   Hypertension   Hypothyroidism   Past Surgical History: Past Surgical History:  Procedure Laterality Date   CATARACT EXTRACTION   HERNIA REPAIR   ORIF DISTAL RADIUS FRACTURE Left   Right Tibia IM mail removal Right 04/30/2021  Dr. Rudene Christians   Past Family History: Family History  Problem Relation Age of Onset   Heart disease Mother   Emphysema Mother   Alzheimer's disease Father   Medications: Current Outpatient Medications Ordered in Epic  Medication Sig Dispense Refill   ALPRAZolam (XANAX) 0.5 MG tablet Take by mouth   atorvastatin (LIPITOR) 10 MG tablet Take 1 tablet by mouth once daily   buPROPion (WELLBUTRIN XL) 150 MG XL tablet Take 1 tablet (150 mg total) by  mouth once daily   escitalopram oxalate (LEXAPRO) 5 MG tablet Take 1 tablet by mouth once daily   EUA PAXLOVID 300 mg (150 mg x 2)-100 mg tablet Take 1 tablet by mouth once daily   gabapentin (NEURONTIN) 300 MG capsule Take 1 capsule (300 mg total) by mouth 3 (three) times daily 90 capsule 3   hydroCHLOROthiazide (HYDRODIURIL) 25 MG tablet Take 1 tablet by mouth once daily   HYDROcodone-acetaminophen (NORCO) 5-325 mg tablet Take 1 tablet by mouth every 8 (eight) hours as needed (Patient not taking: Reported on 09/21/2021) 20 tablet 0   levothyroxine (SYNTHROID) 125 MCG tablet Take 1 tablet by mouth once daily   lisinopriL (ZESTRIL) 10 MG tablet Take 1 tablet by mouth once daily   metoprolol succinate (TOPROL-XL) 100 MG XL tablet Take 1 tablet by mouth once daily   naloxone (NARCAN) 4 mg/actuation nasal spray as directed   nitroGLYcerin (NITROSTAT) 0.4 MG SL tablet Place under the tongue as needed   pantoprazole (PROTONIX) 20 MG DR tablet Take 20 mg by mouth once daily   rivaroxaban (XARELTO) 20 mg tablet Take by mouth   traZODone (DESYREL) 50 MG tablet Take by mouth   No current Epic-ordered facility-administered medications on file.   Allergies: Allergies  Allergen Reactions   Codeine Nausea And Vomiting  With fever   Propofol Unknown  Seizure like activity during colonoscopy    Body mass index is 29.02 kg/m.  Review of Systems: A comprehensive 14 point ROS was performed, reviewed, and the pertinent orthopaedic findings are documented in the HPI.  Vitals:  09/21/21 0813  BP: (!) 140/82    General Physical Examination:   General:  Well developed, well nourished, no apparent distress, normal affect, antalgic gait no assistive device.  HEENT: Head normocephalic, atraumatic, PERRL.   Abdomen: Soft, non tender, non distended, Bowel sounds present.  Heart: Examination of the heart reveals regular, rate, and rhythm. There is no murmur noted on ascultation. There is a normal  apical pulse.  Lungs: Lungs are clear to auscultation. There is no wheeze, rhonchi, or crackles. There is normal expansion of bilateral chest walls.   Right knee: Examination of the right knee shows 0 to 115 degrees range of motion. No laxity valgus varus stress testing. Slight varus deformity. Incision sites completely healed from previous hardware removal. No effusion, edema. No ligamentous laxity  Radiographs:  X-rays of the right knee reviewed by me today from 07/10/2021 show near complete loss of joint space in the medial compartment with varus deformity with tricompartmental arthritic changes.  Assessment: ICD-10-CM  1. Primary osteoarthritis of right knee M17.11  2. Chronic pain of right knee M25.561  G89.29   Plan:  61. 65 year old male with posttraumatic osteoarthritis of the right knee. X-rays show advanced right knee osteoarthritis with complete loss of joint space in the medial compartment. Pain interferes with quality of life and activities daily living. He has failed conservative treatment. Risks, benefits, complications of a right total knee arthroplasty have been discussed with the patient. Patient has agreed and consented procedure with Dr. Kennedy Bucker on 10/02/2021.   Electronically signed by Patience Musca, PA at 09/22/2021 9:00 AM EST  Reviewed  H+P. No changes noted.

## 2021-10-03 DIAGNOSIS — M1711 Unilateral primary osteoarthritis, right knee: Secondary | ICD-10-CM | POA: Diagnosis not present

## 2021-10-03 LAB — BASIC METABOLIC PANEL
Anion gap: 6 (ref 5–15)
BUN: 13 mg/dL (ref 8–23)
CO2: 23 mmol/L (ref 22–32)
Calcium: 8.2 mg/dL — ABNORMAL LOW (ref 8.9–10.3)
Chloride: 102 mmol/L (ref 98–111)
Creatinine, Ser: 0.85 mg/dL (ref 0.61–1.24)
GFR, Estimated: >60 mL/min (ref >60)
Glucose, Bld: 129 mg/dL — ABNORMAL HIGH (ref 70–99)
Potassium: 4 mmol/L (ref 3.5–5.1)
Sodium: 131 mmol/L — ABNORMAL LOW (ref 135–145)

## 2021-10-03 LAB — CBC
HCT: 37.4 % — ABNORMAL LOW (ref 39.0–52.0)
Hemoglobin: 13.6 g/dL (ref 13.0–17.0)
MCH: 33.1 pg (ref 26.0–34.0)
MCHC: 36.4 g/dL — ABNORMAL HIGH (ref 30.0–36.0)
MCV: 91 fL (ref 80.0–100.0)
Platelets: 106 10*3/uL — ABNORMAL LOW (ref 150–400)
RBC: 4.11 MIL/uL — ABNORMAL LOW (ref 4.22–5.81)
RDW: 14.4 % (ref 11.5–15.5)
WBC: 12.4 10*3/uL — ABNORMAL HIGH (ref 4.0–10.5)
nRBC: 0 % (ref 0.0–0.2)

## 2021-10-03 MED ORDER — TRAMADOL HCL 50 MG PO TABS: 50.0000 mg | ORAL_TABLET | Freq: Four times a day (QID) | ORAL | 0 refills | Status: DC

## 2021-10-03 MED ORDER — OXYCODONE HCL 5 MG PO TABS: 5.0000 mg | ORAL_TABLET | ORAL | 0 refills | Status: DC | PRN

## 2021-10-03 NOTE — Progress Notes (Signed)
Physical Therapy Treatment Patient Details Name: Gabriel Hoffman MRN: [id number] DOB: 1956-07-08 Today's Date: 10/03/2021   History of Present Illness Pt is a 65 y.o. male with PMH including a fib, depression, HTN, HLD, hypotyroidism, R Tibia IM mail removal 04/30/2021, ORIF distal radius fx Left, COPD, MI x2, anxiety, and CAD.  Presented with dx of primary localized osteoarthritis of right knee and is s/p R TKA.    PT Comments    Participated in exercises as described below.  Stood and is able to walk to gym for stair training.  Education and cues for safety and techniques but overall does well.  Encouraged +1 assist for gait initially upon discharge.  Voiced understanding.  No further questions or concerns voiced.   Recommendations for follow up therapy are one component of a multi-disciplinary discharge planning process, led by the attending physician.  Recommendations may be updated based on patient status, additional functional criteria and insurance authorization.  Follow Up Recommendations  Home health PT     Assistance Recommended at Discharge Intermittent Supervision/Assistance  Equipment Recommendations  Rolling walker (2 wheels)    Recommendations for Other Services       Precautions / Restrictions Precautions Precautions: Knee Precaution Booklet Issued: Yes (comment) (Left in room) Precaution Comments: in bone foam when in bed or not in CPM Restrictions Weight Bearing Restrictions: Yes RLE Weight Bearing: Weight bearing as tolerated     Mobility  Bed Mobility Overal bed mobility: Needs Assistance Bed Mobility: Supine to Sit     Supine to sit: Supervision     General bed mobility comments: in recliner before and after    Transfers Overall transfer level: Needs assistance Equipment used: Rolling walker (2 wheels) Transfers: Sit to/from Stand Sit to Stand: Supervision     Step pivot transfers: Min guard          Ambulation/Gait Ambulation/Gait  assistance: Min guard Gait Distance (Feet): 150 Feet Assistive device: Rolling walker (2 wheels) Gait Pattern/deviations: Step-to pattern;Decreased step length - right;Decreased step length - left;Decreased stride length;Decreased weight shift to right Gait velocity: decreased         Stairs Stairs: Yes Stairs assistance: Min guard Stair Management: Two rails;Step to pattern;Forwards Number of Stairs: 4     Wheelchair Mobility    Modified Rankin (Stroke Patients Only)       Balance Overall balance assessment: Needs assistance Sitting-balance support: Bilateral upper extremity supported;Feet supported Sitting balance-Leahy Scale: Good     Standing balance support: Bilateral upper extremity supported;During functional activity Standing balance-Leahy Scale: Good                              Cognition Arousal/Alertness: Awake/alert Behavior During Therapy: WFL for tasks assessed/performed Overall Cognitive Status: Within Functional Limits for tasks assessed                                 General Comments: alert and oriented x4        Exercises Total Joint Exercises Ankle Circles/Pumps: AROM;Strengthening;Both;10 reps;Supine Quad Sets: AROM;Strengthening;Both;10 reps;Supine Gluteal Sets: AROM;Strengthening;Both;10 reps;Supine Long Arc Quad: AROM;Strengthening;Both;10 reps;Seated Goniometric ROM: 85 flexion.  in chair so ext not accurate measurement Marching in Standing: AROM;Strengthening;Both;10 reps;Standing    General Comments        Pertinent Vitals/Pain Pain Assessment: Faces Pain Score: 9  Faces Pain Scale: Hurts little more Pain Location: R knee Pain  Descriptors / Indicators: Aching;Discomfort Pain Intervention(s): Limited activity within patient's tolerance;Monitored during session;Premedicated before session;Repositioned;Ice applied    Home Living Family/patient expects to be discharged to:: Private residence Living  Arrangements: Spouse/significant other;Non-relatives/Friends (pt reports 5 people total in house) Available Help at Discharge: Friend(s);Available 24 hours/day Type of Home: House Home Access: Stairs to enter Entrance Stairs-Rails: Can reach both;Right;Left Entrance Stairs-Number of Steps: 2   Home Layout: One level Home Equipment: Grab bars - toilet;Grab bars - tub/shower;Tub bench      Prior Function            PT Goals (current goals can now be found in the care plan section) Progress towards PT goals: Progressing toward goals    Frequency    BID      PT Plan Current plan remains appropriate    Co-evaluation              AM-PAC PT "6 Clicks" Mobility   Outcome Measure  Help needed turning from your back to your side while in a flat bed without using bedrails?: A Little Help needed moving from lying on your back to sitting on the side of a flat bed without using bedrails?: A Little Help needed moving to and from a bed to a chair (including a wheelchair)?: A Little Help needed standing up from a chair using your arms (e.g., wheelchair or bedside chair)?: A Little Help needed to walk in hospital room?: A Little Help needed climbing 3-5 steps with a railing? : A Little 6 Click Score: 18    End of Session Equipment Utilized During Treatment: Gait belt Activity Tolerance: Patient tolerated treatment well Patient left: in chair;with call bell/phone within reach;with chair alarm set;with SCD's reapplied;Other (comment) (with polar reapplied) Nurse Communication: Mobility status (Needs call bell) PT Visit Diagnosis: Other abnormalities of gait and mobility (R26.89);Muscle weakness (generalized) (M62.81);Pain;Unsteadiness on feet (R26.81) Pain - Right/Left: Right Pain - part of body: Knee     Time: 1012-1027 PT Time Calculation (min) (ACUTE ONLY): 15 min  Charges:  $Gait Training: 8-22 mins                    Chesley Noon, PTA 10/03/21, 11:20 AM

## 2021-10-03 NOTE — Evaluation (Signed)
Occupational Therapy Evaluation Patient Details Name: Gabriel Hoffman MRN: [id number] DOB: June 04, 1956 Today's Date: 10/03/2021   History of Present Illness Pt is a 65 y.o. male with PMH including a fib, depression, HTN, HLD, hypotyroidism, R Tibia IM mail removal 04/30/2021, ORIF distal radius fx Left, COPD, MI x2, anxiety, and CAD.  Presented with dx of primary localized osteoarthritis of right knee and is s/p R TKA.   Clinical Impression   Chart reviewed, RN cleared pt for participation in OT evaluation.  Pt seen for OT evaluation this date, POD#1 from above surgery. Pt reports he was independent in all ADLs prior to surgery. Pt is eager to return to PLOF with less pain and improved safety and independence. Pt currently requires minimal assist for LB dressing while in seated position due to pain and limited AROM of R knee. Pt instructed in polar care mgt, falls prevention strategies, home/routines modifications, DME/AE for LB bathing and dressing tasks, and compression stocking mgt. Pt would benefit from skilled OT services including additional instruction in dressing techniques with or without assistive devices for dressing and bathing skills to support recall and carryover prior to discharge and ultimately to maximize safety, independence, and minimize falls risk and caregiver burden. Do not currently anticipate any OT needs following this hospitalization.  Pt is left in bedside chair, NAD, all needs met. Recommend 3 in 1 commode for home use. OT will continue to follow acutely.      Recommendations for follow up therapy are one component of a multi-disciplinary discharge planning process, led by the attending physician.  Recommendations may be updated based on patient status, additional functional criteria and insurance authorization.   Follow Up Recommendations  No OT follow up    Assistance Recommended at Discharge Intermittent Supervision/Assistance  Functional Status Assessment  Patient  has had a recent decline in their functional status and demonstrates the ability to make significant improvements in function in a reasonable and predictable amount of time.  Equipment Recommendations  BSC/3in1    Recommendations for Other Services       Precautions / Restrictions Precautions Precautions: Knee Restrictions Weight Bearing Restrictions: Yes RLE Weight Bearing: Weight bearing as tolerated      Mobility Bed Mobility Overal bed mobility: Needs Assistance Bed Mobility: Supine to Sit     Supine to sit: Supervision     General bed mobility comments: vcs for technique    Transfers Overall transfer level: Needs assistance Equipment used: Rolling walker (2 wheels) Transfers: Sit to/from Stand;Bed to chair/wheelchair/BSC Sit to Stand: Supervision     Step pivot transfers: Min guard            Balance Overall balance assessment: Needs assistance Sitting-balance support: Bilateral upper extremity supported;Feet supported Sitting balance-Leahy Scale: Good     Standing balance support: Bilateral upper extremity supported;During functional activity Standing balance-Leahy Scale: Good                             ADL either performed or assessed with clinical judgement   ADL Overall ADL's : Needs assistance/impaired Eating/Feeding: Independent   Grooming: Wash/dry hands;Wash/dry face;Applying deodorant;Sitting;Set up Grooming Details (indicate cue type and reason): at edge of bed, standing     Lower Body Bathing: Set up;Sitting/lateral leans   Upper Body Dressing : Set up;Sitting Upper Body Dressing Details (indicate cue type and reason): at edge of bed Lower Body Dressing: Minimal assistance;Sitting/lateral leans Lower Body Dressing Details (indicate cue type  and reason): with AE, step by step vcs Toilet Transfer: Min guard;Rolling walker (2 wheels) Toilet Transfer Details (indicate cue type and reason): simulated         Functional  mobility during ADLs: Min guard;Rolling walker (2 wheels)       Vision Patient Visual Report: No change from baseline       Perception     Praxis      Pertinent Vitals/Pain Pain Assessment: 0-10 Pain Score: 9  Pain Location: R knee Pain Descriptors / Indicators: Aching;Discomfort Pain Intervention(s): Limited activity within patient's tolerance;Ice applied;Repositioned;Premedicated before session;Other (comment) (per pt report premedicated)     Hand Dominance     Extremity/Trunk Assessment Upper Extremity Assessment Upper Extremity Assessment: Overall WFL for tasks assessed   Lower Extremity Assessment Lower Extremity Assessment: RLE deficits/detail RLE Deficits / Details: s/p R TKA RLE Sensation: WNL (reports feeling "different" in R foot, sensation WFL)       Communication Communication Communication: No difficulties   Cognition Arousal/Alertness: Awake/alert Behavior During Therapy: WFL for tasks assessed/performed Overall Cognitive Status: Within Functional Limits for tasks assessed                                 General Comments: alert and oriented x4     General Comments       Exercises     Shoulder Instructions      Home Living Family/patient expects to be discharged to:: Private residence Living Arrangements: Spouse/significant other;Non-relatives/Friends (pt reports 5 people total in house) Available Help at Discharge: Friend(s);Available 24 hours/day Type of Home: House Home Access: Stairs to enter [employer] of Steps: 2 Entrance Stairs-Rails: Can reach both;Right;Left Home Layout: One level     Bathroom Shower/Tub: Teacher, early years/pre: Handicapped height Bathroom Accessibility: Yes   Home Equipment: Grab bars - toilet;Grab bars - tub/shower;Tub bench          Prior Functioning/Environment Prior Level of Function : Independent/Modified Independent               ADLs Comments: reports  independent with all ADL        OT Problem List: Decreased strength;Decreased activity tolerance;Decreased knowledge of precautions;Decreased knowledge of use of DME or AE      OT Treatment/Interventions: Self-care/ADL training;Balance training;Therapeutic exercise;DME and/or AE instruction;Therapeutic activities;Patient/family education    OT Goals(Current goals can be found in the care plan section) Acute Rehab OT Goals Patient Stated Goal: to go home OT Goal Formulation: With patient Time For Goal Achievement: 10/17/21 Potential to Achieve Goals: Good ADL Goals Pt Will Perform Grooming: standing;with modified independence Pt Will Perform Lower Body Dressing: with modified independence Pt Will Perform Toileting - Clothing Manipulation and hygiene: with modified independence  OT Frequency: Min 2X/week   Barriers to D/C:            Co-evaluation              AM-PAC OT "6 Clicks" Daily Activity     Outcome Measure Help from another person eating meals?: None Help from another person taking care of personal grooming?: None Help from another person toileting, which includes using toliet, bedpan, or urinal?: A Little Help from another person bathing (including washing, rinsing, drying)?: None Help from another person to put on and taking off regular upper body clothing?: None Help from another person to put on and taking off regular lower body clothing?: A Little 6  Click Score: 22   End of Session Equipment Utilized During Treatment: Gait belt;Rolling walker (2 wheels) Nurse Communication: Mobility status  Activity Tolerance: Patient tolerated treatment well Patient left: in chair;with call bell/phone within reach;with chair alarm set  OT Visit Diagnosis: Unsteadiness on feet (R26.81);Other abnormalities of gait and mobility (R26.89)                Time: 9935-7017 OT Time Calculation (min): 36 min Charges:  OT General Charges $OT Visit: 1 Visit OT Evaluation $OT  Eval Moderate Complexity: 1 Mod OT Treatments $Self Care/Home Management : 8-22 mins Shanon Payor, OTD OTR/L  10/03/21, 10:27 AM

## 2021-10-03 NOTE — Discharge Summary (Addendum)
Physician Discharge Summary  Subjective: 3 Days Post-Op Procedure(s) (LRB): TOTAL KNEE ARTHROPLASTY (Right) Patient reports pain as moderate.   Patient seen in rounds with Dr. Marry Guan. Patient is well, and has had no acute complaints or problems Patient is ready to go home with home health physical therapy  Physician Discharge Summary  Patient ID: Gabriel Hoffman MRN: [id number] DOB/AGE: 63-May-1957 63 y.o.  Admit date: 10/02/2021 Discharge date: 10/05/2021  Admission Diagnoses:  Discharge Diagnoses:  Principal Problem:   S/P TKR (total knee replacement) using cement, right   Discharged Condition: fair  Hospital Course: The patient is postop day 3 from a right total knee replacement.  He has done well since surgery.  His vitals have essentially remained stable.  He has ambulated 200 feet including stairs with physical therapy.  The patient is slowly improving with his pain control.  He is ready to go home with home health physical therapy.  Treatments: surgery:  Right TOTAL KNEE ARTHROPLASTY   SURGEON: Laurene Footman, MD   ASSISTANTS: None   ANESTHESIA:   spinal   EBL: 100 cc   BLOOD ADMINISTERED:none   DRAINS:  Incisional wound VAC     LOCAL MEDICATIONS USED:  MARCAINE    and OTHER Exparel and morphine   SPECIMEN:  No Specimen   DISPOSITION OF SPECIMEN:  N/A   COUNTS:  YES   TOURNIQUET: 78 minutes at 300 mm Hg   IMPLANTS: Medacta  GMK sphere system with 4+ right femur, 4 tibia with short stem 5 mm offset, and 10 mm insert.  Size 2 patella, all components cemented.  Discharge Exam: Blood pressure 102/60, pulse 64, temperature 98.1 F (36.7 C), resp. rate 18, height 5\' 5"  (1.651 m), weight 79.4 kg, SpO2 98 %.   Disposition: Discharge disposition: 06-Home-Health Care Svc       Allergies as of 10/05/2021       Reactions   Codeine Nausea And Vomiting   With fever   Propofol Other (See Comments)   Seizure like activity during colonoscopy.  Tolerated  propofol infusion with no reaction at all 10/02/21.        Medication List     TAKE these medications    ALPRAZolam 0.5 MG tablet Commonly known as: XANAX Take 1 tablet (0.5 mg total) by mouth at bedtime as needed for anxiety or sleep.   atorvastatin 10 MG tablet Commonly known as: LIPITOR Take 1 tablet (10 mg total) by mouth daily.   buPROPion 150 MG 24 hr tablet Commonly known as: WELLBUTRIN XL Take 150 mg by mouth daily.   Clear Eyes for Dry Eyes Plus 0.8-0.25-0.012 % Soln Generic drug: Hypromell-Glycerin-Naphazoline Place 1-2 drops into both eyes 3 (three) times daily as needed (dry/irritated eyes).   escitalopram 10 MG tablet Commonly known as: Lexapro Take 1 tablet (10 mg total) by mouth daily.   gabapentin 300 MG capsule Commonly known as: NEURONTIN Take 300 mg by mouth 3 (three) times daily.   hydrochlorothiazide 25 MG tablet Commonly known as: HYDRODIURIL Take 1 tablet (25 mg total) by mouth daily.   levothyroxine 125 MCG tablet Commonly known as: SYNTHROID Take 1 tablet (125 mcg total) by mouth daily.   lisinopril 10 MG tablet Commonly known as: ZESTRIL Take 1 tablet (10 mg total) by mouth daily.   metoprolol succinate 100 MG 24 hr tablet Commonly known as: TOPROL-XL Take 100 mg by mouth daily.   nitroGLYCERIN 0.4 MG SL tablet Commonly known as: NITROSTAT Place 0.4 mg under the  tongue every 5 (five) minutes x 3 doses as needed for chest pain.   oxyCODONE 5 MG immediate release tablet Commonly known as: Oxy IR/ROXICODONE Take 1-2 tablets (5-10 mg total) by mouth every 4 (four) hours as needed for moderate pain (pain score 4-6).   pantoprazole 20 MG tablet Commonly known as: PROTONIX Take 1 tablet by mouth once daily   rivaroxaban 20 MG Tabs tablet Commonly known as: XARELTO Take 1 tablet (20 mg total) by mouth in the morning.   traMADol 50 MG tablet Commonly known as: ULTRAM Take 1 tablet (50 mg total) by mouth every 6 (six) hours.                Durable Medical Equipment  (From admission, onward)           Start     Ordered   10/02/21 1613  DME Walker rolling  Once       Question Answer Comment  Walker: With 5 Inch Wheels   Patient needs a walker to treat with the following condition S/P TKR (total knee replacement) using cement, right      10/02/21 1612   10/02/21 1613  DME 3 n 1  Once        10/02/21 1612   10/02/21 1613  DME Bedside commode  Once       Question:  Patient needs a bedside commode to treat with the following condition  Answer:  S/P TKR (total knee replacement) using cement, right   10/02/21 1612            Follow-up Information     Duanne Guess, PA-C. Go on 10/16/2021.   Specialties: Orthopedic Surgery, Emergency Medicine Why: For staple removal; Appt @ 9:45 am Contact information: [place] [address] [phone]                 Signed: Feliberto Gottron 10/05/2021, 1:05 PM   Objective: Vital signs in last 24 hours: Temp:  [98.1 F (36.7 C)-99.4 F (37.4 C)] 98.1 F (36.7 C) (12/05 1230) Pulse Rate:  [64-83] 64 (12/05 1230) Resp:  [16-18] 18 (12/05 1230) BP: (102-148)/(60-74) 102/60 (12/05 1230) SpO2:  [94 %-100 %] 98 % (12/05 1230)  Intake/Output from previous day:  Intake/Output Summary (Last 24 hours) at 10/05/2021 1305 Last data filed at 10/05/2021 1052 Gross per 24 hour  Intake 1069.81 ml  Output 1500 ml  Net -430.19 ml    Intake/Output this shift: Total I/O In: -  Out: 500 [Urine:500]  Labs: Recent Labs    10/03/21 0448  HGB 13.6   Recent Labs    10/03/21 0448  WBC 12.4*  RBC 4.11*  HCT 37.4*  PLT 106*   Recent Labs    10/03/21 0448  NA 131*  K 4.0  CL 102  CO2 23  BUN 13  CREATININE 0.85  GLUCOSE 129*  CALCIUM 8.2*   No results for input(s): LABPT, INR in the last 72 hours.  EXAM: General - Patient is Alert and Oriented Extremity - Neurovascular intact Sensation intact  distally Dorsiflexion/Plantar flexion intact Compartment soft Incision - clean, dry, no drainage with the wound VAC in place Motor Function -plantarflexion and dorsiflexion are intact.  Assessment/Plan: 3 Days Post-Op Procedure(s) (LRB): TOTAL KNEE ARTHROPLASTY (Right) Procedure(s) (LRB): TOTAL KNEE ARTHROPLASTY (Right) Past Medical History:  Diagnosis Date   Anginal pain (HCC)    Anxiety    Aortic atherosclerosis (HCC)    Arthritis    Atrial  fibrillation (HCC)    a.) CHA2DS2-VASc Score = 2 (HTN, vascular disease/prior MI).  b.) Rate/rhythm maintained on metoprolol succinate; chronically anticoagulated with rivaroxaban.   CAD (coronary artery disease)    Carotid artery stenosis    Cirrhosis (HCC) 06/15/2020   Complication of anesthesia    a.) seizure like activity with propofol during colonoscopy   COPD (chronic obstructive pulmonary disease) (HCC)    Current use of long term anticoagulation    a.) Rivaroxaban   Diastolic dysfunction 02/25/2020   a.) TTE 02/25/2020: normal LV function; EF 55-65%; GLS -15.8%; G1DD.   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 C282Y and S65C were negative.   Hepatitis C 06/15/2020   a.) Tx'd with Harvoni course   HLD (hyperlipidemia)    Hypertension    Hypothyroidism    Lung nodule    Myocardial infarction (HCC) 2019   x2 MI's no stents   Thrombocytopenia (HCC)    Principal Problem:   S/P TKR (total knee replacement) using cement, right  Estimated body mass index is 29.12 kg/m as calculated from the following:   Height as of this encounter: 5\' 5"  (1.651 m).   Weight as of this encounter: 79.4 kg. Advance diet Up with therapy D/C IV fluids Discharge home with home health Diet - Regular diet Follow up - in 2 weeks Activity - WBAT Disposition - Home Condition Upon Discharge - Stable DVT Prophylaxis -  Xarelto  , PA-C Orthopaedic Surgery 10/05/2021, 1:05 PM

## 2021-10-03 NOTE — TOC Progression Note (Addendum)
Transition of Care [facility]) - Progression Note    Patient Details  Name: YASIR KITNER MRN: [id number] Date of Birth: November 12, 1955  Transition of Care [facility]) CM/SW Contact  Bing Quarry, RN Phone Number: 10/03/2021, 11:59 AM  Clinical Narrative:   Patient has discharge orders, but need [facility] services for wound vac that is in place. Also has knee brace with an ice cooler ice pack in place. A charity RW was given to patient and he says he has a BSC he can use at home. Messaged provider and spoke with Unit RN regarding search for [facility] agency for safe discharge plan. Gabriel Cirri RN CM   [facility] with [facility] [place] [facility] has declined [employer]. [facility] cannot take insurance Medicaid Blue. [facility] contacted as Banker via [employer].   238 pm: [facility] has accepted tentatively as charity case. Will not be 100% confirmed till Monday am. Will take for PT/OT and PT can look at would vac. No RN's available till January. Updated PA provider via secure chat.  Gabriel Cirri RN CM          Expected Discharge Plan and Services           Expected Discharge Date: 10/03/21                                     Social Determinants of Health (SDOH) Interventions    Readmission Risk Interventions No flowsheet data found.

## 2021-10-03 NOTE — Progress Notes (Signed)
  Subjective: 1 Day Post-Op Procedure(s) (LRB): TOTAL KNEE ARTHROPLASTY (Right) Patient reports pain as moderate.   Patient is well, and has had no acute complaints or problems Plan is to go Home after hospital stay. Negative for chest pain and shortness of breath Fever: no Gastrointestinal: Negative for nausea and vomiting  Objective: Vital signs in last 24 hours: Temp:  [97.2 F (36.2 C)-98 F (36.7 C)] 98 F (36.7 C) (12/03 0452) Pulse Rate:  [67-108] 108 (12/03 0452) Resp:  [14-26] 14 (12/02 1545) BP: (103-160)/(66-105) 144/87 (12/03 0452) SpO2:  [93 %-98 %] 98 % (12/03 0452) Weight:  [79.4 kg] 79.4 kg (12/02 1015)  Intake/Output from previous day:  Intake/Output Summary (Last 24 hours) at 10/03/2021 0650 Last data filed at 10/02/2021 1842 Gross per 24 hour  Intake 2090 ml  Output 850 ml  Net 1240 ml    Intake/Output this shift: No intake/output data recorded.  Labs: Recent Labs    10/03/21 0448  HGB 13.6   Recent Labs    10/03/21 0448  WBC 12.4*  RBC 4.11*  HCT 37.4*  PLT 106*   Recent Labs    10/03/21 0448  NA 131*  K 4.0  CL 102  CO2 23  BUN 13  CREATININE 0.85  GLUCOSE 129*  CALCIUM 8.2*   No results for input(s): LABPT, INR in the last 72 hours.   EXAM General - Patient is Alert and Oriented Extremity - Neurovascular intact Sensation intact distally Dorsiflexion/Plantar flexion intact Dressing/Incision - clean, dry, with the wound VAC in place Motor Function - intact, moving foot and toes well on exam.   Past Medical History:  Diagnosis Date   Anginal pain (HCC)    Anxiety    Aortic atherosclerosis (HCC)    Arthritis    Atrial fibrillation (HCC)    a.) CHA2DS2-VASc Score = 2 (HTN, vascular disease/prior MI).  b.) Rate/rhythm maintained on metoprolol succinate; chronically anticoagulated with rivaroxaban.   CAD (coronary artery disease)    Carotid artery stenosis    Cirrhosis (HCC) 06/15/2020   Complication of anesthesia    a.)  seizure like activity with propofol during colonoscopy   COPD (chronic obstructive pulmonary disease) (HCC)    Current use of long term anticoagulation    a.) Rivaroxaban   Diastolic dysfunction 02/25/2020   a.) TTE 02/25/2020: normal LV function; EF 55-65%; GLS -15.8%; G1DD.   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 C282Y and S65C were negative.   Hepatitis C 06/15/2020   a.) Tx'd with Harvoni course   HLD (hyperlipidemia)    Hypertension    Hypothyroidism    Lung nodule    Myocardial infarction (HCC) 2019   x2 MI's no stents   Thrombocytopenia (HCC)     Assessment/Plan: 1 Day Post-Op Procedure(s) (LRB): TOTAL KNEE ARTHROPLASTY (Right) Principal Problem:   S/P TKR (total knee replacement) using cement, right  Estimated body mass index is 29.12 kg/m as calculated from the following:   Height as of this encounter: 5\' 5"  (1.651 m).   Weight as of this encounter: 79.4 kg. Advance diet Up with therapy D/C IV fluids Discharge home with home health  DVT Prophylaxis - Xarelto Weight-Bearing as tolerated to right leg  , PA-C Orthopaedic Surgery 10/03/2021, 6:50 AM

## 2021-10-03 NOTE — Discharge Instructions (Signed)
TOTAL KNEE REPLACEMENT POSTOPERATIVE DIRECTIONS  Knee Rehabilitation, Guidelines Following Surgery  Results after knee surgery are often greatly improved when you follow the exercise, range of motion and muscle strengthening exercises prescribed by your doctor. Safety measures are also important to protect the knee from further injury. Any time any of these exercises cause you to have increased pain or swelling in your knee joint, decrease the amount until you are comfortable again and slowly increase them. If you have problems or questions, call your caregiver or physical therapist for advice.   HOME CARE INSTRUCTIONS  Remove items at home which could result in a fall. This includes throw rugs or furniture in walking pathways.  ICE using the Polar Care unit to the affected knee every three hours for 30 minutes at a time and then as needed for pain and swelling.  Place a dry towel or pillow case over the knee before applying the Polar Care Unit.  Continue to use ice on the knee for pain and swelling from surgery. You may notice swelling that will progress down to the foot and ankle.  This is normal after surgery.  Elevate the leg when you are not up walking on it.   Continue to use the breathing machine which will help keep your temperature down.  It is common for your temperature to cycle up and down following surgery, especially at night when you are not up moving around and exerting yourself.  The breathing machine keeps your lungs expanded and your temperature down. Do not place pillow under knee, focus on keeping the knee straight while resting  DIET You may resume your previous home diet once your are discharged from the hospital.  DRESSING / WOUND CARE / SHOWERING Your wound VAC will stay in place for the next 5 to 7 days.  When the wound VAC begins to sound the alarm, it can be discontinued.  Have your physical therapist help you change it to a new bandage. You need to keep your wound dry  after being discharged home.  Just keep the incision dry and apply a dry gauze dressing on daily. Change the surgical dressing only if needed and reapply a dry dressing each time.  ACTIVITY Walk with your walker as instructed. Use walker as long as suggested by your caregivers. Avoid periods of inactivity such as sitting longer than an hour when not asleep. This helps prevent blood clots.  You may resume a sexual relationship in one month or when given the OK by your doctor.  You may return to work once you are cleared by your doctor.  Do not drive a car for 6 weeks or until released by you surgeon.  Do not drive while taking narcotics.  WEIGHT BEARING Weight bearing as tolerated with assist device (walker, cane, etc) as directed, use it as long as suggested by your surgeon or therapist, typically at least 4-6 weeks.  POSTOPERATIVE CONSTIPATION PROTOCOL Constipation - defined medically as fewer than three stools per week and severe constipation as less than one stool per week.  One of the most common issues patients have following surgery is constipation.  Even if you have a regular bowel pattern at home, your normal regimen is likely to be disrupted due to multiple reasons following surgery.  Combination of anesthesia, postoperative narcotics, change in appetite and fluid intake all can affect your bowels.  In order to avoid complications following surgery, here are some recommendations in order to help you during your recovery period.  Colace (docusate) - Pick up an over-the-counter form of Colace or another stool softener and take twice a day as long as you are requiring postoperative pain medications.  Take with a full glass of water daily.  If you experience loose stools or diarrhea, hold the colace until you stool forms back up.  If your symptoms do not get better within 1 week or if they get worse, check with your doctor.  Dulcolax (bisacodyl) - Pick up over-the-counter and take as  directed by the product packaging as needed to assist with the movement of your bowels.  Take with a full glass of water.  Use this product as needed if not relieved by Colace only.   MiraLax (polyethylene glycol) - Pick up over-the-counter to have on hand.  MiraLax is a solution that will increase the amount of water in your bowels to assist with bowel movements.  Take as directed and can mix with a glass of water, juice, soda, coffee, or tea.  Take if you go more than two days without a movement. Do not use MiraLax more than once per day. Call your doctor if you are still constipated or irregular after using this medication for 7 days in a row.  If you continue to have problems with postoperative constipation, please contact the office for further assistance and recommendations.  If you experience "the worst abdominal pain ever" or develop nausea or vomiting, please contact the office immediatly for further recommendations for treatment.  ITCHING  If you experience itching with your medications, try taking only a single pain pill, or even half a pain pill at a time.  You can also use Benadryl over the counter for itching or also to help with sleep.   TED HOSE STOCKINGS Wear the elastic stockings on both legs for six weeks following surgery during the day but you may remove then at night for sleeping.  MEDICATIONS See your medication summary on the "After Visit Summary" that the nursing staff will review with you prior to discharge.  You may have some home medications which will be placed on hold until you complete the course of blood thinner medication.  It is important for you to complete the blood thinner medication as prescribed by your surgeon.  Continue your approved medications as instructed at time of discharge.  PRECAUTIONS If you experience chest pain or shortness of breath - call 911 immediately for transfer to the hospital emergency department.  If you develop a fever greater that 101  F, purulent drainage from wound, increased redness or drainage from wound, foul odor from the wound/dressing, or calf pain - CONTACT YOUR SURGEON.                                                   FOLLOW-UP APPOINTMENTS Make sure you keep all of your appointments after your operation with your surgeon and caregivers. You should call the office at the above phone number and make an appointment for approximately two weeks after the date of your surgery or on the date instructed by your surgeon outlined in the "After Visit Summary".   RANGE OF MOTION AND STRENGTHENING EXERCISES  Rehabilitation of the knee is important following a knee injury or an operation. After just a few days of immobilization, the muscles of the thigh which control the knee become weakened and  shrink (atrophy). Knee exercises are designed to build up the tone and strength of the thigh muscles and to improve knee motion. Often times heat used for twenty to thirty minutes before working out will loosen up your tissues and help with improving the range of motion but do not use heat for the first two weeks following surgery. These exercises can be done on a training (exercise) mat, on the floor, on a table or on a bed. Use what ever works the best and is most comfortable for you Knee exercises include:  Leg Lifts - While your knee is still immobilized in a splint or cast, you can do straight leg raises. Lift the leg to 60 degrees, hold for 3 sec, and slowly lower the leg. Repeat 10-20 times 2-3 times daily. Perform this exercise against resistance later as your knee gets better.  Quad and Hamstring Sets - Tighten up the muscle on the front of the thigh (Quad) and hold for 5-10 sec. Repeat this 10-20 times hourly. Hamstring sets are done by pushing the foot backward against an object and holding for 5-10 sec. Repeat as with quad sets.  Leg Slides: Lying on your back, slowly slide your foot toward your buttocks, bending your knee up off the  floor (only go as far as is comfortable). Then slowly slide your foot back down until your leg is flat on the floor again. Angel Wings: Lying on your back spread your legs to the side as far apart as you can without causing discomfort.  A rehabilitation program following serious knee injuries can speed recovery and prevent re-injury in the future due to weakened muscles. Contact your doctor or a physical therapist for more information on knee rehabilitation.   IF YOU ARE TRANSFERRED TO A SKILLED REHAB FACILITY If the patient is transferred to a skilled rehab facility following release from the hospital, a list of the current medications will be sent to the facility for the patient to continue.  When discharged from the skilled rehab facility, please have the facility set up the patient's Home Health Physical Therapy prior to being released. Also, the skilled facility will be responsible for providing the patient with their medications at time of release from the facility to include their pain medication, the muscle relaxants, and their blood thinner medication. If the patient is still at the rehab facility at time of the two week follow up appointment, the skilled rehab facility will also need to assist the patient in arranging follow up appointment in our office and any transportation needs.  MAKE SURE YOU:  Understand these instructions.  Get help right away if you are not doing well or get worse.    Pick up stool softner and laxative for home use following surgery while on pain medications. Do not submerge incision under water. Please use good hand washing techniques while changing dressing each day. May shower starting three days after surgery. Please use a clean towel to pat the incision dry following showers. Continue to use ice for pain and swelling after surgery. Do not use any lotions or creams on the incision until instructed by your surgeon.

## 2021-10-04 DIAGNOSIS — M1711 Unilateral primary osteoarthritis, right knee: Secondary | ICD-10-CM | POA: Diagnosis not present

## 2021-10-04 MED ORDER — SODIUM CHLORIDE 0.9 % IV SOLN: INTRAVENOUS | Status: DC

## 2021-10-04 MED ORDER — SODIUM CHLORIDE 0.9 % IV BOLUS
500.0000 mL | Freq: Once | INTRAVENOUS | Status: AC
Start: 2021-10-04 — End: 2021-10-04
  Administered 2021-10-04: 10:00:00 500 mL via INTRAVENOUS

## 2021-10-04 NOTE — TOC Progression Note (Signed)
Transition of Care [facility]) - Progression Note    Patient Details  Name: Gabriel Hoffman MRN: [id number] Date of Birth: Oct 31, 1956  [facility] CM/SW Contact  Caryn Section, RN Phone Number: 10/04/2021, 11:28 AM  Clinical Narrative:    Confirmed with Medical team that patient will be discharged tomorrow, following [facility] confirmation of acceptance.  Toc to follow      Barriers to Discharge:  (Patient POD#1 for TKR with wound vac in place.)  Expected Discharge Plan and Services           Expected Discharge Date: 10/04/21                                     Social Determinants of Health (SDOH) Interventions    Readmission Risk Interventions No flowsheet data found.

## 2021-10-04 NOTE — Progress Notes (Signed)
Physical Therapy Treatment Patient Details Name: Gabriel Hoffman MRN: [id number] DOB: 1956-06-05 Today's Date: 10/04/2021   History of Present Illness Pt is a 65 y.o. male with PMH including a fib, depression, HTN, HLD, hypotyroidism, R Tibia IM mail removal 04/30/2021, ORIF distal radius fx Left, COPD, MI x2, anxiety, and CAD.  Presented with dx of primary localized osteoarthritis of right knee and is s/p R TKA.    PT Comments    Pt received supine in bed, agreeable to therapy. He increased ambulation distance to 221ft this session.  As ambulation progressed, step length and cadence increased with decreased stability and 1 knee buckle as pt attempted step-through pattern. As to not compromise safety, PT cued to decrease step length and cadence and to not feel rushed. Multiple standing rest breaks due to BUE fatigue. Pt states he has only performed ankles pumps this date - PT encouraged pt to perform remaining exercises within packet. Would benefit from skilled PT to address above deficits and promote optimal return to PLOF.   Recommendations for follow up therapy are one component of a multi-disciplinary discharge planning process, led by the attending physician.  Recommendations may be updated based on patient status, additional functional criteria and insurance authorization.  Follow Up Recommendations  Home health PT     Assistance Recommended at Discharge Intermittent Supervision/Assistance  Equipment Recommendations  Rolling walker (2 wheels)    Recommendations for Other Services       Precautions / Restrictions Precautions Precautions: Knee Precaution Booklet Issued: Yes (comment) (Left in room) Precaution Comments: in bone foam when in bed or not in CPM Restrictions Weight Bearing Restrictions: Yes RLE Weight Bearing: Weight bearing as tolerated     Mobility  Bed Mobility Overal bed mobility: Needs Assistance Bed Mobility: Supine to Sit     Supine to sit:  Supervision     General bed mobility comments: SUP for safety and managing lines. In recliner at end of session.    Transfers Overall transfer level: Needs assistance Equipment used: Rolling walker (2 wheels) Transfers: Sit to/from Stand Sit to Stand: Supervision           General transfer comment: SUP for safety.    Ambulation/Gait Ambulation/Gait assistance: Min guard Gait Distance (Feet): 200 Feet Assistive device: Rolling walker (2 wheels) Gait Pattern/deviations: Step-to pattern;Decreased stride length;Decreased weight shift to right;Decreased stance time - right       General Gait Details: CGA for safety. As ambulation progressed, step length and cadence increased with decreased stability and 1 knee buckle - VC to slow down for safety. Multiple standing rest breaks due to BUE fatigue.   Stairs             Wheelchair Mobility    Modified Rankin (Stroke Patients Only)       Balance Overall balance assessment: Needs assistance Sitting-balance support: Feet supported Sitting balance-Leahy Scale: Good     Standing balance support: Bilateral upper extremity supported;During functional activity Standing balance-Leahy Scale: Fair Standing balance comment: Fair static standing, does not require UE support on RW. Does require CGA and RW during ambulation.                            Cognition Arousal/Alertness: Awake/alert Behavior During Therapy: WFL for tasks assessed/performed Overall Cognitive Status: Within Functional Limits for tasks assessed  Exercises      General Comments        Pertinent Vitals/Pain Pain Assessment: 0-10 Pain Score: 8  Pain Location: R knee Pain Descriptors / Indicators: Aching;Discomfort Pain Intervention(s): Limited activity within patient's tolerance;Monitored during session;Premedicated before session;Repositioned;Ice applied    Home Living                           Prior Function            PT Goals (current goals can now be found in the care plan section) Acute Rehab PT Goals 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 PT Goal Formulation: With patient Time For Goal Achievement: 10/15/21 Potential to Achieve Goals: Good    Frequency    BID      PT Plan      Co-evaluation              AM-PAC PT "6 Clicks" Mobility   Outcome Measure  Help needed turning from your back to your side while in a flat bed without using bedrails?: None Help needed moving from lying on your back to sitting on the side of a flat bed without using bedrails?: A Little Help needed moving to and from a bed to a chair (including a wheelchair)?: A Little Help needed standing up from a chair using your arms (e.g., wheelchair or bedside chair)?: A Little Help needed to walk in hospital room?: A Little Help needed climbing 3-5 steps with a railing? : A Little 6 Click Score: 19    End of Session Equipment Utilized During Treatment: Gait belt Activity Tolerance: Patient tolerated treatment well Patient left: in chair;with call bell/phone within reach;with chair alarm set;Other (comment) (with polar reapplied) Nurse Communication: Mobility status (Needs call bell) PT Visit Diagnosis: Other abnormalities of gait and mobility (R26.89);Muscle weakness (generalized) (M62.81);Pain;Unsteadiness on feet (R26.81) Pain - Right/Left: Right Pain - part of body: Knee     Time: 6063-0160 PT Time Calculation (min) (ACUTE ONLY): 20 min  Charges:  $Therapeutic Exercise: 8-22 mins                     Basilia Jumbo PT, DPT 10/04/21 12:34 PM [phone]

## 2021-10-04 NOTE — Progress Notes (Signed)
Subjective: 2 Days Post-Op Procedure(s) (LRB): TOTAL KNEE ARTHROPLASTY (Right) Patient reports pain as mild to moderate.   Patient is well, and has had no acute complaints or problems Plan is to go Home after hospital stay. Negative for chest pain and shortness of breath Fever: no Gastrointestinal: Negative for nausea and vomiting  Objective: Vital signs in last 24 hours: Temp:  [98.3 F (36.8 C)-98.7 F (37.1 C)] 98.3 F (36.8 C) (12/03 2031) Pulse Rate:  [68-98] 72 (12/03 2031) Resp:  [16-18] 17 (12/03 2031) BP: (117-135)/(71-78) 135/77 (12/03 2031) SpO2:  [93 %-97 %] 97 % (12/03 2031)  Intake/Output from previous day:  Intake/Output Summary (Last 24 hours) at 10/04/2021 0659 Last data filed at 10/04/2021 0348 Gross per 24 hour  Intake 891.07 ml  Output 500 ml  Net 391.07 ml    Intake/Output this shift: Total I/O In: 891.1 [I.V.:891.1] Out: 300 [Urine:300]  Labs: Recent Labs    10/03/21 0448  HGB 13.6   Recent Labs    10/03/21 0448  WBC 12.4*  RBC 4.11*  HCT 37.4*  PLT 106*   Recent Labs    10/03/21 0448  NA 131*  K 4.0  CL 102  CO2 23  BUN 13  CREATININE 0.85  GLUCOSE 129*  CALCIUM 8.2*   No results for input(s): LABPT, INR in the last 72 hours.   EXAM General - Patient is Alert and Oriented Extremity - Neurovascular intact Sensation intact distally Dorsiflexion/Plantar flexion intact Dressing/Incision - clean, dry, with the wound VAC in place Motor Function - intact, moving foot and toes well on exam.  The patient ambulated 150 feet including 4 stairs.  Past Medical History:  Diagnosis Date   Anginal pain ([place])    Anxiety    Aortic atherosclerosis ([place])    Arthritis    Atrial fibrillation ([place])    a.) CHA2DS2-VASc Score = 2 (HTN, vascular disease/prior MI).  b.) Rate/rhythm maintained on metoprolol succinate; chronically anticoagulated with rivaroxaban.   CAD (coronary artery disease)    Carotid artery stenosis    Cirrhosis ([place])  [id number]   Complication of anesthesia    a.) seizure like activity with propofol during colonoscopy   COPD (chronic obstructive pulmonary disease) ([place])    Current use of long term anticoagulation    a.) Rivaroxaban   Diastolic dysfunction 0000000   a.) TTE 02/25/2020: normal LV function; EF 55-65%; GLS -15.8%; G1DD.   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 C282Y and S65C were negative.   Hepatitis C 06/15/2020   a.) Tx'd with Harvoni course   HLD (hyperlipidemia)    Hypertension    Hypothyroidism    Lung nodule    Myocardial infarction ([place]) 2019   x2 MI's no stents   Thrombocytopenia (HCC)     Assessment/Plan: 2 Days Post-Op Procedure(s) (LRB): TOTAL KNEE ARTHROPLASTY (Right) Principal Problem:   S/P TKR (total knee replacement) using cement, right  Estimated body mass index is 29.12 kg/m as calculated from the following:   Height as of this encounter: 5\' 5"  (1.651 m).   Weight as of this encounter: 79.4 kg. Advance diet Up with therapy D/C IV fluids Discharge home with home health planned for today.  They are working on his home health therapy through care management.  DVT Prophylaxis - Xarelto Weight-Bearing as tolerated to right leg  Reche Dixon, PA-C  Orthopaedic Surgery 10/04/2021, 6:59 AM

## 2021-10-04 NOTE — Plan of Care (Signed)

## 2021-10-05 ENCOUNTER — Telehealth: Payer: Self-pay

## 2021-10-05 ENCOUNTER — Encounter: Payer: Self-pay | Admitting: Orthopedic Surgery

## 2021-10-05 DIAGNOSIS — M1711 Unilateral primary osteoarthritis, right knee: Secondary | ICD-10-CM | POA: Diagnosis not present

## 2021-10-05 NOTE — Progress Notes (Signed)
Discharge Note: Reviewed discharge instructions with pt. Pt verbalized understanding. Pt discharged with compression stockings, polar care, a rolling walker, and personal belongings. Staff wheeled pt out. Pt transported to home via family vehicle.

## 2021-10-05 NOTE — Anesthesia Postprocedure Evaluation (Signed)
Anesthesia Post Note  Patient: Gabriel Hoffman  Procedure(s) Performed: TOTAL KNEE ARTHROPLASTY (Right: Knee)  Patient location during evaluation: PACU Anesthesia Type: Spinal Level of consciousness: oriented and awake and alert Pain management: pain level controlled Vital Signs Assessment: post-procedure vital signs reviewed and stable Respiratory status: spontaneous breathing, respiratory function stable and patient connected to nasal cannula oxygen Cardiovascular status: blood pressure returned to baseline and stable Postop Assessment: no headache, no backache and no apparent nausea or vomiting Anesthetic complications: no   No notable events documented.   Last Vitals:  Vitals:   10/04/21 1928 10/05/21 0355  BP: (!) 148/74 121/69  Pulse: 75 82  Resp: 16 18  Temp: 36.9 C 36.8 C  SpO2: 100% 94%    Last Pain:  Vitals:   10/05/21 0651  TempSrc:   PainSc: 3                  Corinda Gubler

## 2021-10-05 NOTE — Telephone Encounter (Signed)
Adelina Mings [phone]) from [facility] called requesting OT and PT orders to treat patient.  Gave verbal ok to treat.

## 2021-10-05 NOTE — TOC Progression Note (Addendum)
Transition of Care [facility]) - Progression Note    Patient Details  Name: Gabriel Hoffman MRN: [id number] Date of Birth: 03-Feb-1956  Transition of Care [facility]) CM/SW Contact  Marlowe Sax, RN Phone Number: 10/05/2021, 8:56 AM  Clinical Narrative:   Patient to DC with a prevena wound vac, He has wellcare in place for [facility], does not need nursing      Barriers to Discharge:  (Patient POD#1 for TKR with wound vac in place.)  Expected Discharge Plan and Services           Expected Discharge Date: 10/05/21                                     Social Determinants of Health (SDOH) Interventions    Readmission Risk Interventions No flowsheet data found.

## 2021-10-05 NOTE — Progress Notes (Signed)
   Subjective: 3 Days Post-Op Procedure(s) (LRB): TOTAL KNEE ARTHROPLASTY (Right) Patient reports pain as mild.   Patient is well, and has had no acute complaints or problems Denies any CP, SOB, ABD pain. We will continue therapy today.  Plan is to go Home after hospital stay.  Objective: Vital signs in last 24 hours: Temp:  [98.2 F (36.8 C)-99.4 F (37.4 C)] 98.3 F (36.8 C) (12/05 0759) Pulse Rate:  [62-83] 83 (12/05 0759) Resp:  [16-18] 18 (12/05 0759) BP: (91-148)/(57-74) 131/70 (12/05 0759) SpO2:  [94 %-100 %] 96 % (12/05 0759)  Intake/Output from previous day: 12/04 0701 - 12/05 0700 In: 1429.8 [P.O.:360; I.V.:1069.8] Out: 1000 [Urine:1000] Intake/Output this shift: No intake/output data recorded.  Recent Labs    10/03/21 0448  HGB 13.6   Recent Labs    10/03/21 0448  WBC 12.4*  RBC 4.11*  HCT 37.4*  PLT 106*   Recent Labs    10/03/21 0448  NA 131*  K 4.0  CL 102  CO2 23  BUN 13  CREATININE 0.85  GLUCOSE 129*  CALCIUM 8.2*   No results for input(s): LABPT, INR in the last 72 hours.  EXAM General - Patient is Alert, Appropriate, and Oriented Extremity - Neurovascular intact Sensation intact distally Intact pulses distally Dorsiflexion/Plantar flexion intact Dressing - dressing C/D/I and no drainage, Prevena intact without drainage Motor Function - intact, moving foot and toes well on exam.   Past Medical History:  Diagnosis Date   Anginal pain (HCC)    Anxiety    Aortic atherosclerosis (HCC)    Arthritis    Atrial fibrillation (HCC)    a.) CHA2DS2-VASc Score = 2 (HTN, vascular disease/prior MI).  b.) Rate/rhythm maintained on metoprolol succinate; chronically anticoagulated with rivaroxaban.   CAD (coronary artery disease)    Carotid artery stenosis    Cirrhosis (HCC) 06/15/2020   Complication of anesthesia    a.) seizure like activity with propofol during colonoscopy   COPD (chronic obstructive pulmonary disease) (HCC)    Current  use of long term anticoagulation    a.) Rivaroxaban   Diastolic dysfunction 02/25/2020   a.) TTE 02/25/2020: normal LV function; EF 55-65%; GLS -15.8%; G1DD.   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 C282Y and S65C were negative.   Hepatitis C 06/15/2020   a.) Tx'd with Harvoni course   HLD (hyperlipidemia)    Hypertension    Hypothyroidism    Lung nodule    Myocardial infarction (HCC) 2019   x2 MI's no stents   Thrombocytopenia (HCC)     Assessment/Plan:   3 Days Post-Op Procedure(s) (LRB): TOTAL KNEE ARTHROPLASTY (Right) Principal Problem:   S/P TKR (total knee replacement) using cement, right  Estimated body mass index is 29.12 kg/m as calculated from the following:   Height as of this encounter: 5\' 5"  (1.651 m).   Weight as of this encounter: 79.4 kg. Advance diet Up with therapy  Pain well controlled Vital signs are stable Care management to assist with discharge to home with home health PT today  DVT Prophylaxis - Xarelto, Foot Pumps, and TED hose Weight-Bearing as tolerated to right leg   T. , PA-C [facility] Orthopaedics 10/05/2021, 8:12 AM

## 2021-10-05 NOTE — Progress Notes (Signed)
Physical Therapy Treatment Patient Details Name: Gabriel Hoffman MRN: [id number] DOB: Nov 20, 1955 Today's Date: 10/05/2021   History of Present Illness Pt is a 65 y.o. male with PMH including a fib, depression, HTN, HLD, hypotyroidism, R Tibia IM mail removal 04/30/2021, ORIF distal radius fx Left, COPD, MI x2, anxiety, and CAD.  Presented with dx of primary localized osteoarthritis of right knee and is s/p R TKA.    PT Comments    Attempted PT this am, pt requested pain meds first. Upon return, pt continued to c/o 8/10 R knee pain with weight bearing, however willing to participate. ROM completed in supine and sitting, R knee 0-90 AAROM.  Pt educated on importance of positioning and compliance with HEP.  Pt required supervision for bed mobility and transfers. Gait with RW and CG/Supervision 139ft with step to gait. Negotiation of 3 steps twice with seated rest break in between for demonstration and addidtional education. Pt struggled with first attempt, however was able to complete with CGA on second attempt stating "my nerves are getting to me". Overall good tolerance for mobility. Appears ready for d/c home with intermittent assist and HHPT.    Recommendations for follow up therapy are one component of a multi-disciplinary discharge planning process, led by the attending physician.  Recommendations may be updated based on patient status, additional functional criteria and insurance authorization.  Follow Up Recommendations  Home health PT     Assistance Recommended at Discharge Intermittent Supervision/Assistance  Equipment Recommendations  Rolling walker (2 wheels)    Recommendations for Other Services       Precautions / Restrictions Precautions Precautions: Knee Precaution Booklet Issued: Yes (comment) Precaution Comments: in bone foam when in bed or not in CPM Restrictions Weight Bearing Restrictions: Yes RLE Weight Bearing: Weight bearing as tolerated     Mobility  Bed  Mobility Overal bed mobility: Needs Assistance Bed Mobility: Supine to Sit     Supine to sit: Supervision          Transfers Overall transfer level: Needs assistance Equipment used: Rolling walker (2 wheels) Transfers: Sit to/from Stand Sit to Stand: Supervision           General transfer comment: SUP for safety.    Ambulation/Gait Ambulation/Gait assistance: Supervision Gait Distance (Feet): 150 Feet Assistive device: Rolling walker (2 wheels) Gait Pattern/deviations: Step-to pattern;Decreased stride length;Decreased weight shift to right;Decreased stance time - right Gait velocity: decreased     General Gait Details:  (R hip external rotation noted, pt stated this is his baseline)   Stairs Stairs: Yes Stairs assistance: Min guard Stair Management: Two rails;Step to pattern;Forwards Number of Stairs: 3 General stair comments:  (Pt unsteady during first attempt to negotiate steps requiring ModA to correct R knee buckling. Pt without incident on second trial)   Wheelchair Mobility    Modified Rankin (Stroke Patients Only)       Balance                                            Cognition Arousal/Alertness: Awake/alert Behavior During Therapy: WFL for tasks assessed/performed Overall Cognitive Status: Within Functional Limits for tasks assessed                                 General Comments: alert and oriented x4  Exercises Total Joint Exercises Ankle Circles/Pumps: AROM;Strengthening;Both;10 reps;Supine Quad Sets: AROM;Right;10 reps Long Arc Quad: AAROM;Right;10 reps Goniometric ROM: 0-90    General Comments        Pertinent Vitals/Pain Pain Assessment: 0-10 Pain Score: 8  Pain Location: R knee Pain Descriptors / Indicators: Aching;Discomfort Pain Intervention(s): Premedicated before session    Home Living                          Prior Function            PT Goals (current goals  can now be found in the care plan section) Acute Rehab PT Goals Patient Stated Goal:  (Get home) Progress towards PT goals: Progressing toward goals    Frequency    BID      PT Plan Current plan remains appropriate    Co-evaluation              AM-PAC PT "6 Clicks" Mobility   Outcome Measure  Help needed turning from your back to your side while in a flat bed without using bedrails?: None Help needed moving from lying on your back to sitting on the side of a flat bed without using bedrails?: A Little Help needed moving to and from a bed to a chair (including a wheelchair)?: A Little Help needed standing up from a chair using your arms (e.g., wheelchair or bedside chair)?: A Little Help needed to walk in hospital room?: A Little Help needed climbing 3-5 steps with a railing? : A Little 6 Click Score: 19    End of Session Equipment Utilized During Treatment: Gait belt Activity Tolerance: Patient tolerated treatment well Patient left: in chair;with call bell/phone within reach;with chair alarm set;Other (comment) Nurse Communication: Mobility status PT Visit Diagnosis: Other abnormalities of gait and mobility (R26.89);Muscle weakness (generalized) (M62.81);Pain;Unsteadiness on feet (R26.81) Pain - Right/Left: Right Pain - part of body: Knee     Time: 1000-1050 PT Time Calculation (min) (ACUTE ONLY): 50 min  Charges:  $Gait Training: 23-37 mins $Therapeutic Exercise: 8-22 mins                    Zadie Cleverly, PTA  Jannet Askew 10/05/2021, 12:01 PM

## 2021-10-22 ENCOUNTER — Encounter: Payer: Self-pay | Admitting: Internal Medicine

## 2021-11-03 ENCOUNTER — Telehealth: Payer: Self-pay

## 2021-11-03 NOTE — Telephone Encounter (Signed)
Completed medical record request and mailed requesting records to [facility] at [id number] [place] [address].

## 2021-12-02 ENCOUNTER — Encounter: Payer: Self-pay | Admitting: Orthopedic Surgery

## 2021-12-14 ENCOUNTER — Ambulatory Visit: Payer: Medicaid Other | Admitting: Physician Assistant

## 2021-12-14 ENCOUNTER — Encounter: Payer: Self-pay | Admitting: Physician Assistant

## 2021-12-14 ENCOUNTER — Other Ambulatory Visit: Payer: Self-pay

## 2021-12-14 DIAGNOSIS — R0989 Other specified symptoms and signs involving the circulatory and respiratory systems: Secondary | ICD-10-CM

## 2021-12-14 DIAGNOSIS — I10 Essential (primary) hypertension: Secondary | ICD-10-CM | POA: Diagnosis not present

## 2021-12-14 DIAGNOSIS — F5101 Primary insomnia: Secondary | ICD-10-CM

## 2021-12-14 DIAGNOSIS — I480 Paroxysmal atrial fibrillation: Secondary | ICD-10-CM | POA: Diagnosis not present

## 2021-12-14 DIAGNOSIS — E782 Mixed hyperlipidemia: Secondary | ICD-10-CM

## 2021-12-14 DIAGNOSIS — E538 Deficiency of other specified B group vitamins: Secondary | ICD-10-CM

## 2021-12-14 DIAGNOSIS — Z125 Encounter for screening for malignant neoplasm of prostate: Secondary | ICD-10-CM

## 2021-12-14 DIAGNOSIS — R5383 Other fatigue: Secondary | ICD-10-CM

## 2021-12-14 DIAGNOSIS — E039 Hypothyroidism, unspecified: Secondary | ICD-10-CM

## 2021-12-14 MED ORDER — ALPRAZOLAM 0.5 MG PO TABS: ORAL_TABLET | ORAL | 2 refills | Status: DC

## 2021-12-14 NOTE — Progress Notes (Signed)
[facility] [place], [place] [address]  Internal MEDICINE  Office Visit Note  Patient Name: Gabriel Hoffman  [id number]  [id number]  Date of Service: 12/14/2021  Chief Complaint  Patient presents with   Follow-up   Gastroesophageal Reflux   Hypertension   Hyperlipidemia    HPI Pt is here for routine follow up and med refill -He was recently pulled down the stairs by dog post knee replacement and has visit tomorrow for follow up with new xrays to evaluate. -States he feels like he needs more xanax to help sleep at night, he was taking 1 tab at night to help him sleep, but has a hard time with this where he lives. Requests double, however discussed risks and may only take extra 1/2 tab as needed. Cautioned on use especially if taking pain medication as these can lower respiratory system and make him very groggy. -BP stable -Due for repeat carotid US -Due for routine fasting labs prior to CPE next visit  Current Medication: Outpatient Encounter Medications as of 12/14/2021  Medication Sig   atorvastatin (LIPITOR) 10 MG tablet Take 1 tablet (10 mg total) by mouth daily.   buPROPion (WELLBUTRIN XL) 150 MG 24 hr tablet Take 150 mg by mouth daily.   escitalopram (LEXAPRO) 10 MG tablet Take 1 tablet (10 mg total) by mouth daily.   gabapentin (NEURONTIN) 300 MG capsule Take 300 mg by mouth 3 (three) times daily.   hydrochlorothiazide (HYDRODIURIL) 25 MG tablet Take 1 tablet (25 mg total) by mouth daily.   Hypromell-Glycerin-Naphazoline (CLEAR EYES FOR DRY EYES PLUS) 0.8-0.25-0.012 % SOLN Place 1-2 drops into both eyes 3 (three) times daily as needed (dry/irritated eyes).   levothyroxine (SYNTHROID) 125 MCG tablet Take 1 tablet (125 mcg total) by mouth daily.   lisinopril (ZESTRIL) 10 MG tablet Take 1 tablet (10 mg total) by mouth daily.   metoprolol succinate (TOPROL-XL) 100 MG 24 hr tablet Take 100 mg by mouth daily.   nitroGLYCERIN (NITROSTAT) 0.4 MG SL tablet  Place 0.4 mg under the tongue every 5 (five) minutes x 3 doses as needed for chest pain.   oxyCODONE (OXY IR/ROXICODONE) 5 MG immediate release tablet Take 1-2 tablets (5-10 mg total) by mouth every 4 (four) hours as needed for moderate pain (pain score 4-6).   pantoprazole (PROTONIX) 20 MG tablet Take 1 tablet by mouth once daily   rivaroxaban (XARELTO) 20 MG TABS tablet Take 1 tablet (20 mg total) by mouth in the morning.   traMADol (ULTRAM) 50 MG tablet Take 1 tablet (50 mg total) by mouth every 6 (six) hours.   [DISCONTINUED] ALPRAZolam (XANAX) 0.5 MG tablet Take 1 tablet (0.5 mg total) by mouth at bedtime as needed for anxiety or sleep.   ALPRAZolam (XANAX) 0.5 MG tablet May take 1-1.5 tabs by mouth at night before bed as needed   No facility-administered encounter medications on file as of 12/14/2021.    Surgical History: Past Surgical History:  Procedure Laterality Date   CARDIAC CATHETERIZATION     CATARACT EXTRACTION Bilateral    COLONOSCOPY     FRACTURE SURGERY Left    pins rods left wrist   HARDWARE REMOVAL Right 04/30/2021   Procedure: HARDWARE REMOVAL, Right tibia IM nail removal;  Surgeon: Hessie Knows, MD;  Location: [facility] ORS;  Service: Orthopedics;  Laterality: Right;   HERNIA REPAIR Bilateral    LEG SURGERY Right    rod in leg   TOTAL KNEE ARTHROPLASTY Right 10/02/2021  Procedure: TOTAL KNEE ARTHROPLASTY;  Surgeon: Hessie Knows, MD;  Location: [facility] ORS;  Service: Orthopedics;  Laterality: Right;   UPPER GI ENDOSCOPY      Medical History: Past Medical History:  Diagnosis Date   Anginal pain ([place])    Anxiety    Aortic atherosclerosis (HCC)    Arthritis    Atrial fibrillation ([place])    a.) CHA2DS2-VASc Score = 2 (HTN, vascular disease/prior MI).  b.) Rate/rhythm maintained on metoprolol succinate; chronically anticoagulated with rivaroxaban.   CAD (coronary artery disease)    Carotid artery stenosis    Cirrhosis ([place]) [id number]   Complication of anesthesia     a.) seizure like activity with propofol during colonoscopy   COPD (chronic obstructive pulmonary disease) ([place])    Current use of long term anticoagulation    a.) Rivaroxaban   Diastolic dysfunction 0000000   a.) TTE 02/25/2020: normal LV function; EF 55-65%; GLS -15.8%; G1DD.   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 C282Y and S65C were negative.   Hepatitis C 06/15/2020   a.) Tx'd with Harvoni course   HLD (hyperlipidemia)    Hypertension    Hypothyroidism    Lung nodule    Myocardial infarction ([place]) 2019   x2 MI's no stents   Thrombocytopenia ([place])     Family History: Family History  Problem Relation Age of Onset   Cancer Mother    COPD Mother    Cancer Sister     Social History   Socioeconomic History   Marital status: Single    Spouse name: Not on file   Number of children: Not on file   Years of education: Not on file   Highest education level: Not on file  Occupational History   Not on file  Tobacco Use   Smoking status: Every Day    Packs/day: 1.00    Years: 50.00    Pack years: 50.00    Types: Cigarettes   Smokeless tobacco: Never   Tobacco comments:    Smokes about 4 cigarettes daily  Vaping Use   Vaping Use: Never used  Substance and Sexual Activity   Alcohol use: Never    Comment: Quit   Drug use: Never   Sexual activity: Not on file  Other Topics Concern   Not on file  Social History Narrative   Alcohol/beer once/twice a month; 1/2 ppd; used to work in [employer]. [place]; with sister.    Social Determinants of Health   Financial Resource Strain: Not on file  Food Insecurity: Not on file  Transportation Needs: Not on file  Physical Activity: Not on file  Stress: Not on file  Social Connections: Not on file  Intimate Partner Violence: Not on file      Review of Systems  Constitutional:   Negative for chills, fatigue and unexpected weight change.  HENT:  Negative for congestion, postnasal drip, rhinorrhea, sneezing and sore throat.   Eyes:  Negative for redness.  Respiratory:  Negative for cough, chest tightness and shortness of breath.   Cardiovascular:  Negative for chest pain and palpitations.  Gastrointestinal:  Negative for abdominal pain, constipation, diarrhea, nausea and vomiting.  Genitourinary:  Negative for dysuria and frequency.  Musculoskeletal:  Positive for arthralgias. Negative for back pain, joint swelling and neck pain.  Skin:  Negative for rash.  Neurological: Negative.  Negative  for tremors and numbness.  Hematological:  Negative for adenopathy. Does not bruise/bleed easily.  Psychiatric/Behavioral:  Positive for sleep disturbance. Negative for behavioral problems (Depression) and suicidal ideas. The patient is nervous/anxious.    Vital Signs: BP 118/82    Pulse 78    Temp 97.6 F (36.4 C)    Resp 16    Ht 5\' 5"  (1.651 m)    Wt 164 lb 3.2 oz (74.5 kg)    SpO2 96%    BMI 27.32 kg/m    Physical Exam Vitals and nursing note reviewed.  Constitutional:      General: He is not in acute distress.    Appearance: He is well-developed. He is not diaphoretic.  HENT:     Head: Normocephalic and atraumatic.     Mouth/Throat:     Pharynx: No oropharyngeal exudate.  Eyes:     Pupils: Pupils are equal, round, and reactive to light.  Neck:     Thyroid: No thyromegaly.     Vascular: No JVD.     Trachea: No tracheal deviation.  Cardiovascular:     Rate and Rhythm: Normal rate and regular rhythm.     Heart sounds: Normal heart sounds. No murmur heard.   No friction rub. No gallop.  Pulmonary:     Effort: Pulmonary effort is normal. No respiratory distress.     Breath sounds: No wheezing or rales.  Chest:     Chest wall: No tenderness.  Abdominal:     General: Bowel sounds are normal.     Palpations: Abdomen is soft.  Musculoskeletal:        General:  Normal range of motion.     Cervical back: Normal range of motion and neck supple.  Lymphadenopathy:     Cervical: No cervical adenopathy.  Skin:    General: Skin is warm and dry.  Neurological:     Mental Status: He is alert and oriented to person, place, and time.     Cranial Nerves: No cranial nerve deficit.  Psychiatric:        Behavior: Behavior normal.        Thought Content: Thought content normal.        Judgment: Judgment normal.       Assessment/Plan: 1. Essential hypertension Stable, continue current medications  2. PAF (paroxysmal atrial fibrillation) ([place]) Followed by cardiology, on xarelto  3. Primary insomnia May take 1 to 1.5 tabs as needed before bed but cautioned on use - ALPRAZolam (XANAX) 0.5 MG tablet; May take 1-1.5 tabs by mouth at night before bed as needed  Dispense: 45 tablet; Refill: 2 [place] Controlled Substance Database was reviewed by me for overdose risk score (ORS)  4. Bilateral carotid bruits - US Carotid Duplex Bilateral; Future  5. Acquired hypothyroidism Will check labs and adjust dose as indicated - TSH + free T4  6. Mixed hyperlipidemia Continue lipitor and will update labs - Lipid Panel With LDL/HDL Ratio  7. B12 deficiency - B12 and Folate Panel  8. Special screening for malignant neoplasm of prostate - PSA Total (Reflex To Free)  9. Other fatigue - CBC w/Diff/Platelet - Comprehensive metabolic panel - TSH + free T4 - Lipid Panel With LDL/HDL Ratio - B12 and Folate Panel   General Counseling: khadir hagen understanding of the findings of todays visit and agrees with plan of treatment. I have discussed any further diagnostic evaluation that may be needed or ordered today. We also reviewed his medications today. he has been encouraged  to call the office with any questions or concerns that should arise related to todays visit.    Orders Placed This Encounter  Procedures   US Carotid Duplex Bilateral   CBC  w/Diff/Platelet   Comprehensive metabolic panel   TSH + free T4   Lipid Panel With LDL/HDL Ratio   B12 and Folate Panel   PSA Total (Reflex To Free)    Meds ordered this encounter  Medications   ALPRAZolam (XANAX) 0.5 MG tablet    Sig: May take 1-1.5 tabs by mouth at night before bed as needed    Dispense:  45 tablet    Refill:  2    This patient was seen by Drema Dallas, PA-C in collaboration with Dr. Clayborn Bigness as a part of collaborative care agreement.   Total time spent:30 Minutes Time spent includes review of chart, medications, test results, and follow up plan with the patient.      Dr Lavera Guise Internal medicine

## 2021-12-16 ENCOUNTER — Other Ambulatory Visit: Payer: Self-pay | Admitting: Orthopedic Surgery

## 2021-12-16 DIAGNOSIS — S22080A Wedge compression fracture of T11-T12 vertebra, initial encounter for closed fracture: Secondary | ICD-10-CM

## 2021-12-16 DIAGNOSIS — M47816 Spondylosis without myelopathy or radiculopathy, lumbar region: Secondary | ICD-10-CM

## 2021-12-16 DIAGNOSIS — M5416 Radiculopathy, lumbar region: Secondary | ICD-10-CM

## 2021-12-16 DIAGNOSIS — M5136 Other intervertebral disc degeneration, lumbar region: Secondary | ICD-10-CM

## 2021-12-16 LAB — CBC WITH DIFFERENTIAL/PLATELET
Basophils Absolute: 0.1 10*3/uL (ref 0.0–0.2)
Basos: 1 %
EOS (ABSOLUTE): 0.6 10*3/uL — ABNORMAL HIGH (ref 0.0–0.4)
Eos: 5 %
Hematocrit: 42.9 % (ref 37.5–51.0)
Hemoglobin: 15.5 g/dL (ref 13.0–17.7)
Immature Grans (Abs): 0 10*3/uL (ref 0.0–0.1)
Immature Granulocytes: 0 %
Lymphocytes Absolute: 3.2 10*3/uL — ABNORMAL HIGH (ref 0.7–3.1)
Lymphs: 25 %
MCH: 32.4 pg (ref 26.6–33.0)
MCHC: 36.1 g/dL — ABNORMAL HIGH (ref 31.5–35.7)
MCV: 90 fL (ref 79–97)
Monocytes Absolute: 1.5 10*3/uL — ABNORMAL HIGH (ref 0.1–0.9)
Monocytes: 12 %
Neutrophils Absolute: 7.1 10*3/uL — ABNORMAL HIGH (ref 1.4–7.0)
Neutrophils: 57 %
Platelets: 161 10*3/uL (ref 150–450)
RBC: 4.79 x10E6/uL (ref 4.14–5.80)
RDW: 12.4 % (ref 11.6–15.4)
WBC: 12.5 10*3/uL — ABNORMAL HIGH (ref 3.4–10.8)

## 2021-12-16 LAB — COMPREHENSIVE METABOLIC PANEL
ALT: 14 IU/L (ref 0–44)
AST: 18 IU/L (ref 0–40)
Albumin/Globulin Ratio: 1.1 — ABNORMAL LOW (ref 1.2–2.2)
Albumin: 4 g/dL (ref 3.8–4.8)
Alkaline Phosphatase: 80 IU/L (ref 44–121)
BUN/Creatinine Ratio: 19 (ref 10–24)
BUN: 21 mg/dL (ref 8–27)
Bilirubin Total: 0.7 mg/dL (ref 0.0–1.2)
CO2: 21 mmol/L (ref 20–29)
Calcium: 9.6 mg/dL (ref 8.6–10.2)
Chloride: 97 mmol/L (ref 96–106)
Creatinine, Ser: 1.12 mg/dL (ref 0.76–1.27)
Globulin, Total: 3.5 g/dL (ref 1.5–4.5)
Glucose: 92 mg/dL (ref 70–99)
Potassium: 3.8 mmol/L (ref 3.5–5.2)
Sodium: 135 mmol/L (ref 134–144)
Total Protein: 7.5 g/dL (ref 6.0–8.5)
eGFR: 74 mL/min/{1.73_m2} (ref >59)

## 2021-12-16 LAB — LIPID PANEL WITH LDL/HDL RATIO
Cholesterol, Total: 102 mg/dL (ref 100–199)
HDL: 21 mg/dL — ABNORMAL LOW (ref >39)
LDL Chol Calc (NIH): 55 mg/dL (ref 0–99)
LDL/HDL Ratio: 2.6 ratio (ref 0.0–3.6)
Triglycerides: 149 mg/dL (ref 0–149)
VLDL Cholesterol Cal: 26 mg/dL (ref 5–40)

## 2021-12-16 LAB — TSH+FREE T4
Free T4: 1.74 ng/dL (ref 0.82–1.77)
TSH: 0.833 u[IU]/mL (ref 0.450–4.500)

## 2021-12-16 LAB — PSA TOTAL (REFLEX TO FREE): Prostate Specific Ag, Serum: 1.1 ng/mL (ref 0.0–4.0)

## 2021-12-16 LAB — B12 AND FOLATE PANEL
Folate: 3.6 ng/mL (ref >3.0)
Vitamin B-12: 644 pg/mL (ref 232–1245)

## 2021-12-23 ENCOUNTER — Ambulatory Visit
Admission: RE | Admit: 2021-12-23 | Discharge: 2021-12-23 | Disposition: A | Discharge status: Home / Self Care | Payer: Medicaid Other | Source: Ambulatory Visit | Attending: Orthopedic Surgery | Admitting: Orthopedic Surgery

## 2021-12-23 DIAGNOSIS — M5416 Radiculopathy, lumbar region: Secondary | ICD-10-CM | POA: Diagnosis present

## 2021-12-23 DIAGNOSIS — S22080A Wedge compression fracture of T11-T12 vertebra, initial encounter for closed fracture: Secondary | ICD-10-CM | POA: Diagnosis not present

## 2021-12-23 DIAGNOSIS — M5136 Other intervertebral disc degeneration, lumbar region: Secondary | ICD-10-CM | POA: Insufficient documentation

## 2021-12-23 DIAGNOSIS — M47816 Spondylosis without myelopathy or radiculopathy, lumbar region: Secondary | ICD-10-CM | POA: Diagnosis present

## 2021-12-23 DIAGNOSIS — M51369 Other intervertebral disc degeneration, lumbar region without mention of lumbar back pain or lower extremity pain: Secondary | ICD-10-CM

## 2021-12-23 IMAGING — MR MR LUMBAR SPINE W/O CM
5 series · 30 of 48 positions shown · non-contrast
Comparison: Lumbar spine radiographs [DATE]. Chest CT
[DATE].

CLINICAL DATA: Lumbar spondylosis. Lumbar degenerative disc
disease. T12 compression fracture. Lumbar radiculopathy. Low back
pain radiating into the left buttock with numbness and tingling.

EXAM:
MRI LUMBAR SPINE WITHOUT CONTRAST
TECHNIQUE: Multiplanar, multisequence MR imaging of the lumbar spine was
performed. No intravenous contrast was administered.

[Series 5: T2 · sagittal · 4.0mm · 0.81mm/px · 6 of 17 slices shown (1 of 2)]
[im 1/17]
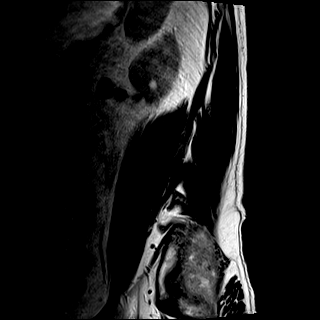
[im 4/17]
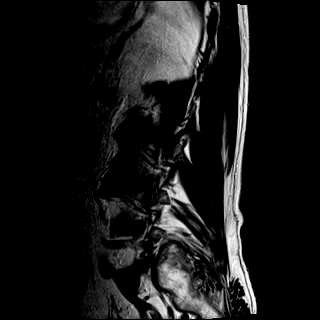
[im 7/17]
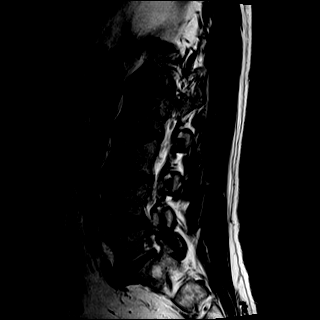
[im 10/17]
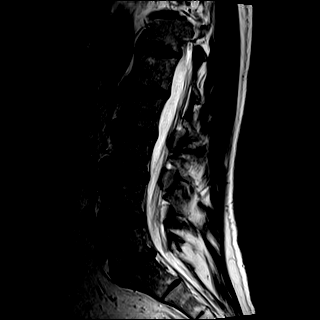
[im 13/17]
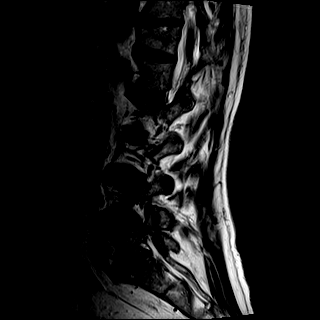
[im 17/17]
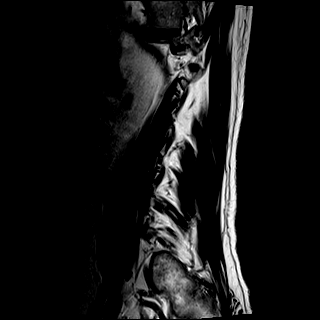

[Series 6: T1 · sagittal · 4.0mm · 0.81mm/px · 7 of 17 slices shown (1 of 2)]
[im 1/17]
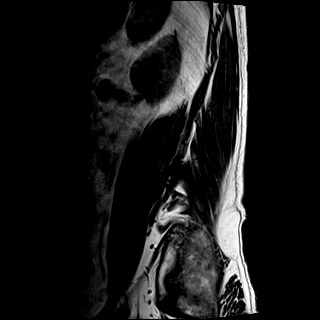
[im 3/17]
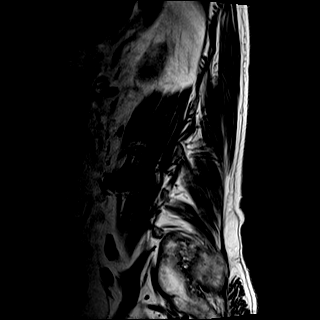
[im 6/17]
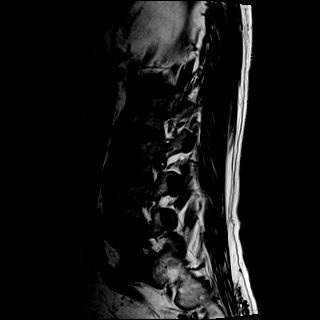
[im 9/17]
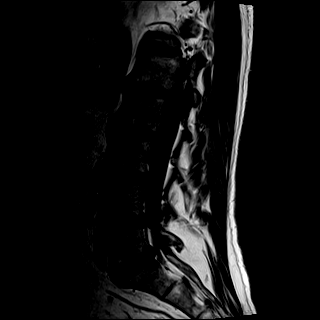
[im 11/17]
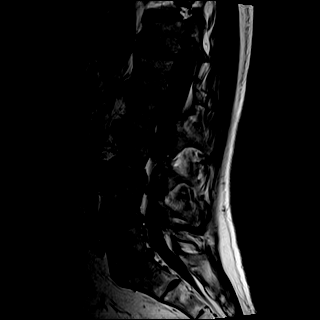
[im 14/17]
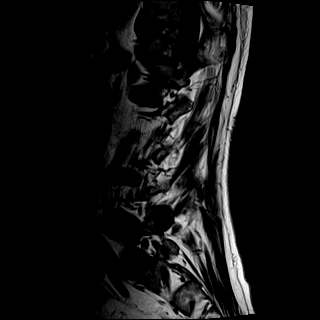
[im 17/17]
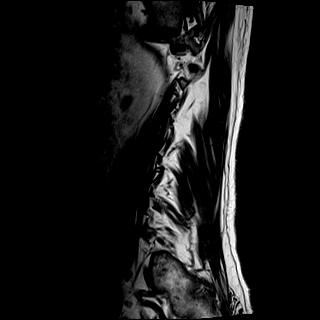

[Series 7: STIR · sagittal · 4.0mm · 0.41mm/px · 1 of 17 slices shown]
[im 1/17]
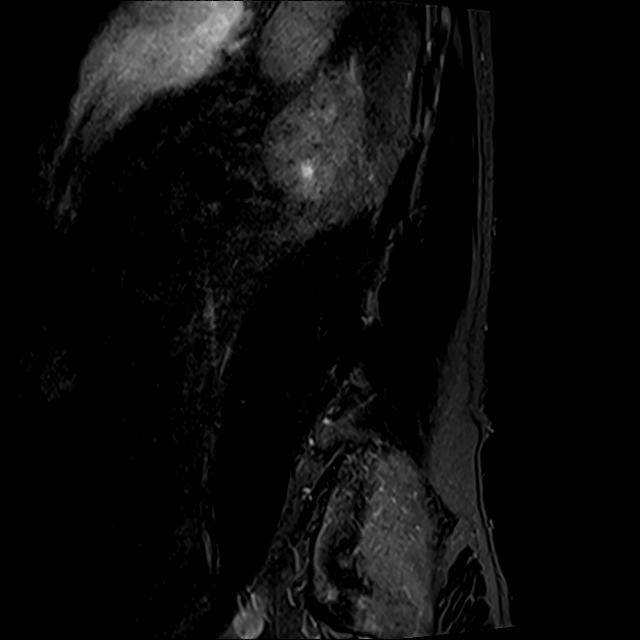

[Series 8: T2 · axial · 4.0mm · 0.78mm/px · z∈[-96,+115]mm · 8 of 37 slices shown (2 of 2)]
[im 1/37]
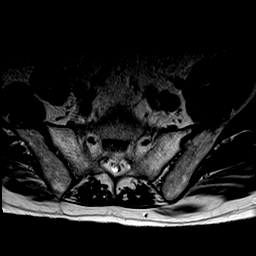
[im 6/37]
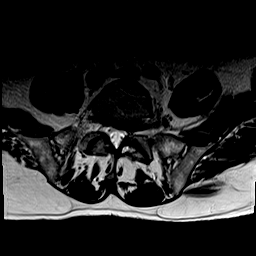
[im 12/37]
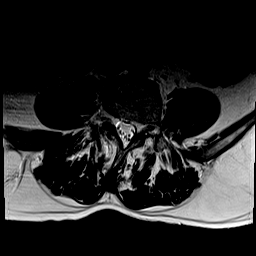
[im 17/37]
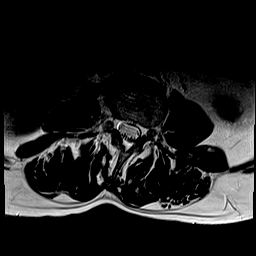
[im 20/37]
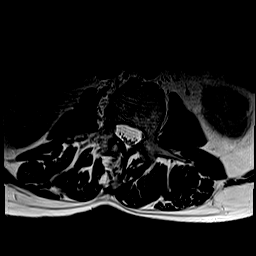
[im 25/37]
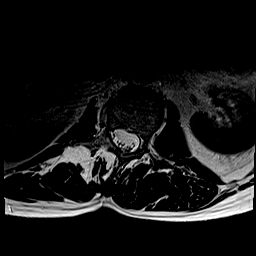
[im 31/37]
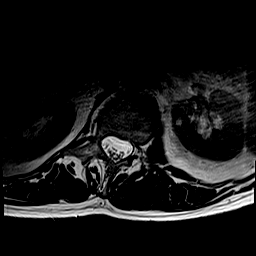
[im 37/37]
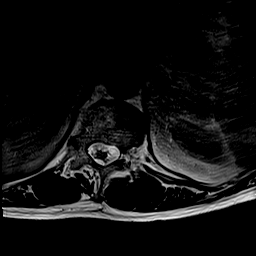

[Series 9: T1 · axial · 4.0mm · 0.39mm/px · z∈[-96,+115]mm · 8 of 37 slices shown (2 of 2)]
[im 1/37]
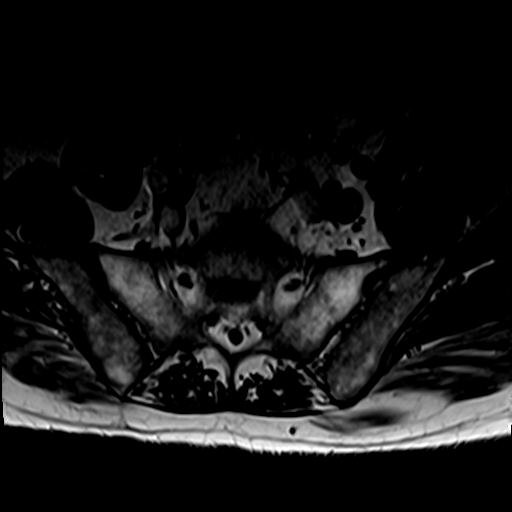
[im 6/37]
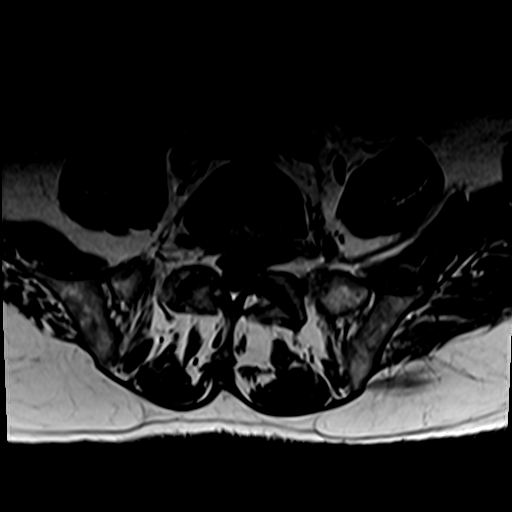
[im 12/37]
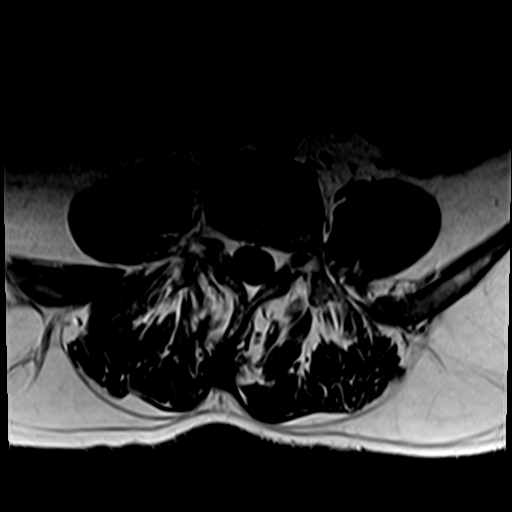
[im 17/37]
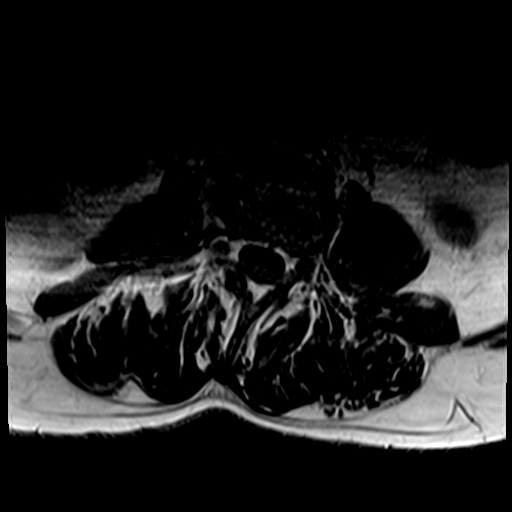
[im 20/37]
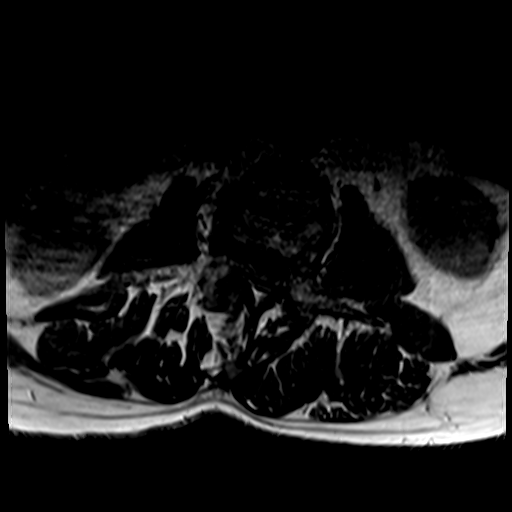
[im 25/37]
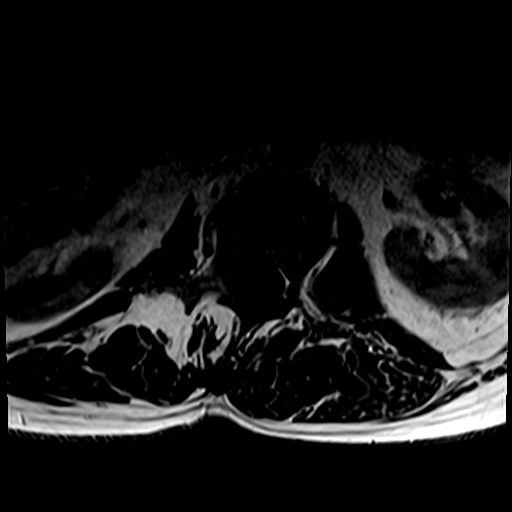
[im 31/37]
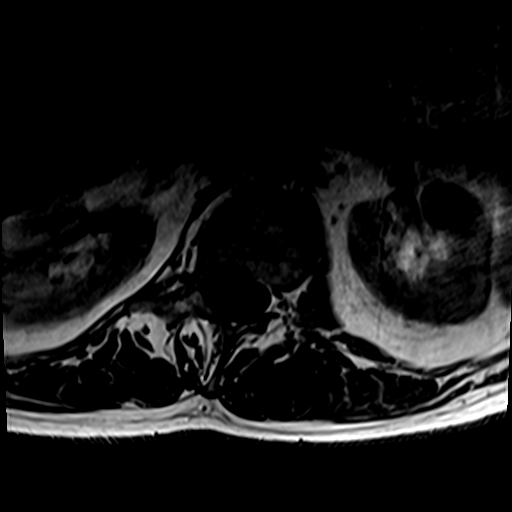
[im 37/37]
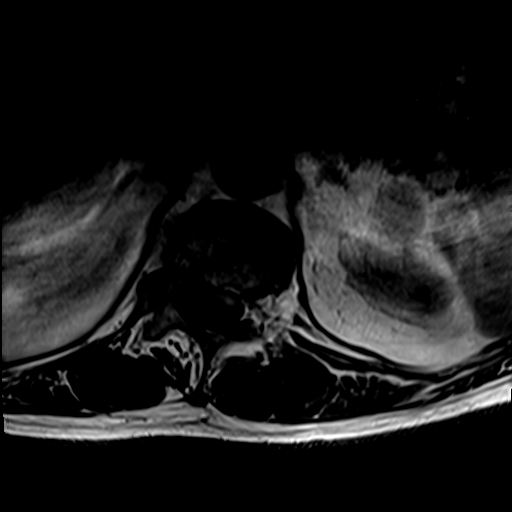

[30 of 48 positions shown; findings below may reference images not displayed]

FINDINGS: Segmentation:  Standard.

Alignment: Moderate lumbar levoscoliosis. No significant listhesis.

Vertebrae: Mild chronic T12 compression fracture, unchanged from the
[LQ] chest CT. No lumbar fracture, suspicious marrow lesion, or
significant marrow edema.

Conus medullaris and cauda equina: Conus extends to the L1 level.
Conus and cauda equina appear normal.

Paraspinal and other soft tissues: Unremarkable.

Disc levels:

Disc desiccation throughout the lumbar spine.

T12-L1: Mild right facet hypertrophy without disc herniation or
stenosis.

L1-2: Minimal disc bulging and mild left facet hypertrophy without
stenosis.

L2-3: Minimal disc bulging and mild facet hypertrophy without
stenosis.

L3-4: Circumferential disc bulging eccentric to the right and mild
facet hypertrophy result in borderline bilateral lateral recess and
right neural foraminal stenosis without spinal stenosis.

L4-5: Mild disc bulging and moderate facet hypertrophy result in
borderline left lateral recess stenosis without spinal or neural
foraminal stenosis.

L5-S1: Disc bulging, a small left subarticular disc protrusion, and
mild right and moderate left facet hypertrophy result in
mild-to-moderate left lateral recess stenosis with disc contacting
the left S1 nerve root. Borderline to mild left neural foraminal
stenosis. No spinal stenosis.
IMPRESSION: 1. Moderate lumbar levoscoliosis.
2. Mild-to-moderate left lateral recess stenosis at L5-S1 in part
due to a small disc protrusion. Correlate for left S1 radiculopathy.
3. Mild disc and moderate facet degeneration elsewhere without
evidence of neural impingement.

## 2021-12-24 ENCOUNTER — Other Ambulatory Visit: Payer: Self-pay | Admitting: Internal Medicine

## 2021-12-24 DIAGNOSIS — I10 Essential (primary) hypertension: Secondary | ICD-10-CM

## 2021-12-29 ENCOUNTER — Telehealth: Payer: Self-pay

## 2021-12-29 NOTE — Telephone Encounter (Signed)
Spoke with patients regarding lab results.

## 2021-12-29 NOTE — Telephone Encounter (Signed)
-----   Message from Mylinda Latina, PA-C sent at 12/29/2021  1:02 PM EST ----- Please let him know that overall his labs looked good except he did have a slightly elevated WBC.

## 2022-01-06 ENCOUNTER — Ambulatory Visit: Payer: Medicaid Other

## 2022-01-06 ENCOUNTER — Other Ambulatory Visit: Payer: Self-pay

## 2022-01-06 ENCOUNTER — Telehealth: Payer: Self-pay | Admitting: Cardiology

## 2022-01-06 DIAGNOSIS — R0989 Other specified symptoms and signs involving the circulatory and respiratory systems: Secondary | ICD-10-CM

## 2022-01-06 NOTE — Telephone Encounter (Signed)
Patient calling states he has not received heart monitor. ETA first of this month, please assist. ?

## 2022-01-06 NOTE — Telephone Encounter (Signed)
Called patient and informed him that I am reaching out to [facility] to inquire about the shipment and delivery of his monitor and that I will be contacting him as soon as I hear back from someone. ? ?Patient was very grateful for the call back. ?

## 2022-01-06 NOTE — Telephone Encounter (Signed)
Sent an email to Otto Herb with [employer] to inquire about shipment of Zio monitor. [employer] has the target shipment date listed as 01/06/21, however still lists shipment as pending. ?

## 2022-01-07 ENCOUNTER — Other Ambulatory Visit: Payer: Self-pay | Admitting: Internal Medicine

## 2022-01-07 DIAGNOSIS — I10 Essential (primary) hypertension: Secondary | ICD-10-CM

## 2022-01-08 NOTE — Telephone Encounter (Signed)
Called patient and he informed me that he received the monitor today. Patient was grateful for the follow up. ?

## 2022-01-09 DIAGNOSIS — I480 Paroxysmal atrial fibrillation: Secondary | ICD-10-CM | POA: Diagnosis not present

## 2022-01-10 NOTE — Procedures (Signed)
[facility] ?[address] ?[place], [place] [address] ? ?DATE OF SERVICE: January 06, 2022 ? ?CAROTID DOPPLER INTERPRETATION: ? ?Bilateral Carotid Ultrsasound and Color Doppler Examination was performed. The RIGHT CCA shows mild plaque in the vessel. The LEFT CCA shows mild plaque in the vessel. There was no intimal thickening noted in the RIGHT carotid artery. There was no intimal thickening in the LEFT carotid artery. ? ?The RIGHT CCA shows peak systolic velocity of 72 cm per second. The end diastolic velocity is 20 cm per second on the RIGHT side. The RIGHT ICA shows peak systolic velocity of 91 per second. RIGHT sided ICA end diastolic velocity is 24 cm per second. The RIGHT ECA shows a peak systolic velocity of 101 cm per second. The ICA/CCA ratio is calculated to be 1.3. This suggests less than 50% stenosis. The Vertebral Artery shows antegrade flow. ? ?The LEFT CCA shows peak systolic velocity of 86 cm per second. The end diastolic velocity is 24 cm per second on the LEFT side. The LEFT ICA shows peak systolic velocity of 90 per second. LEFT sided ICA end diastolic velocity is 23 cm per second. The LEFT ECA shows a peak systolic velocity of 124 cm per second. The ICA/CCA ratio is calculated to be 1.1. This suggests less than 50% stenosis. The Vertebral Artery shows antegrade flow. ? ? ?Impression:   ? ?The RIGHT CAROTID shows less than 50% stenosis. The LEFT CAROTID shows less than 50% stenosis.  There is mild plaque formation noted on the LEFT and mild plaque on the RIGHT  side. Consider a repeat Carotid doppler if clinical situation and symptoms warrant in 6-12 months. Patient should be encouraged to change lifestyles such as smoking cessation, regular exercise and dietary modification. Use of statins in the right clinical setting and ASA is encouraged. ? ?Yevonne Pax, MD FCCP ?Pulmonary Critical Care Medicine ? ?

## 2022-01-15 ENCOUNTER — Other Ambulatory Visit: Payer: Self-pay | Admitting: Internal Medicine

## 2022-01-15 ENCOUNTER — Encounter: Payer: Self-pay | Admitting: Physician Assistant

## 2022-01-15 ENCOUNTER — Telehealth: Payer: Self-pay

## 2022-01-15 DIAGNOSIS — E039 Hypothyroidism, unspecified: Secondary | ICD-10-CM

## 2022-01-15 NOTE — Telephone Encounter (Signed)
Completed medical records for [facility] Faxed [phone]  

## 2022-01-18 ENCOUNTER — Telehealth: Payer: Self-pay

## 2022-01-20 NOTE — Telephone Encounter (Signed)
error 

## 2022-01-21 ENCOUNTER — Ambulatory Visit: Payer: Medicaid Other | Admitting: Physician Assistant

## 2022-01-21 ENCOUNTER — Telehealth: Payer: Self-pay

## 2022-01-21 ENCOUNTER — Other Ambulatory Visit: Payer: Self-pay

## 2022-01-21 ENCOUNTER — Encounter: Payer: Self-pay | Admitting: Physician Assistant

## 2022-01-21 DIAGNOSIS — G8929 Other chronic pain: Secondary | ICD-10-CM

## 2022-01-21 DIAGNOSIS — M5442 Lumbago with sciatica, left side: Secondary | ICD-10-CM | POA: Diagnosis not present

## 2022-01-21 DIAGNOSIS — M47816 Spondylosis without myelopathy or radiculopathy, lumbar region: Secondary | ICD-10-CM | POA: Diagnosis not present

## 2022-01-21 DIAGNOSIS — I10 Essential (primary) hypertension: Secondary | ICD-10-CM

## 2022-01-21 DIAGNOSIS — M25561 Pain in right knee: Secondary | ICD-10-CM | POA: Diagnosis not present

## 2022-01-21 DIAGNOSIS — F5101 Primary insomnia: Secondary | ICD-10-CM

## 2022-01-21 MED ORDER — OXYCODONE HCL 5 MG PO TABS: ORAL_TABLET | ORAL | 0 refills | Status: DC

## 2022-01-21 NOTE — Telephone Encounter (Signed)
Referral for pain clinic printed and placed in Gabriel Hoffman's folder. ?

## 2022-01-21 NOTE — Telephone Encounter (Signed)
Referral for pain management placed in Toni's folder. ?

## 2022-01-21 NOTE — Progress Notes (Signed)
[facility] ?[address] ?[place], [place] [address] ? ?Internal MEDICINE  ?Office Visit Note ? ?Patient Name: Gabriel Hoffman ? [id number]  ?[id number] ? ?Date of Service: 01/26/2022 ? ?Chief Complaint  ?Patient presents with  ? Follow-up  ?  Other provider is no longer going to prescribe oxycodone ?  ? Hyperlipidemia  ? Anxiety  ? ? ?HPI ?Pt is here for routine follow up ?-Ortho was prescribing pain meds and stated they could not continue since he was needing continued medication and was also taking xanax from our office. He needs pain clinic referral. Pt has letter that actually explains that his insurance is enforcing [employer] recipient management lock in program due to multiple pain med prescribers. He reports that his knee surgeon prescribed pain meds from surgery, and then Dr. Floyce Stakes was treating his low back pain, while he is also receiving xanax from our office. This led to 3 prescribers for controlled substances. His paper states only one provider can proscribe pain/nerve meds moving forward and he will need to fill out paper stating who this will be. Stated that this will be pain management as we cannot prescribe long-term pain meds from our office. Can send single refill while new referral placed and pt will complete form for office in which referral sent. ?-he is still requiring treatment due to both low back pain and right knee pain still. He had his right knee surgery, but then had a fall a few months ago and continues to be painful ?-script has been for Oxycodone 5mg  BID, has been out of it a few days and taking a lot of 800mg  ibuprofen in the meantime and still in pain. Previously had tramadol which didn't help and is no longer taking this. Did discuss insurance may not pay for pain meds until he is established with pain management as sole prescriber. ?-Further discussed the dangers of taking opioids and benzo (xanax) and to not take at the same time and limit quantity. Pt expressed  understanding. ? ?Current Medication: ?Outpatient Encounter Medications as of 01/21/2022  ?Medication Sig  ? ALPRAZolam (XANAX) 0.5 MG tablet May take 1-1.5 tabs by mouth at night before bed as needed  ? atorvastatin (LIPITOR) 10 MG tablet Take 1 tablet (10 mg total) by mouth daily.  ? buPROPion (WELLBUTRIN XL) 150 MG 24 hr tablet Take 150 mg by mouth daily.  ? escitalopram (LEXAPRO) 10 MG tablet Take 1 tablet (10 mg total) by mouth daily.  ? gabapentin (NEURONTIN) 300 MG capsule Take 300 mg by mouth 3 (three) times daily.  ? hydrochlorothiazide (HYDRODIURIL) 25 MG tablet TAKE 1 TABLET DAILY.  ? Hypromell-Glycerin-Naphazoline (CLEAR EYES FOR DRY EYES PLUS) 0.8-0.25-0.012 % SOLN Place 1-2 drops into both eyes 3 (three) times daily as needed (dry/irritated eyes).  ? levothyroxine (SYNTHROID) 125 MCG tablet TAKE 1 TABLET DAILY.  ? lisinopril (ZESTRIL) 10 MG tablet TAKE 1 TABLET DAILY.  ? metoprolol succinate (TOPROL-XL) 100 MG 24 hr tablet Take 100 mg by mouth daily.  ? nitroGLYCERIN (NITROSTAT) 0.4 MG SL tablet Place 0.4 mg under the tongue every 5 (five) minutes x 3 doses as needed for chest pain.  ? pantoprazole (PROTONIX) 20 MG tablet Take 1 tablet by mouth once daily  ? rivaroxaban (XARELTO) 20 MG TABS tablet Take 1 tablet (20 mg total) by mouth in the morning.  ? [DISCONTINUED] oxyCODONE (OXY IR/ROXICODONE) 5 MG immediate release tablet Take 1-2 tablets (5-10 mg total) by mouth every 4 (four) hours as needed  for moderate pain (pain score 4-6).  ? [DISCONTINUED] traMADol (ULTRAM) 50 MG tablet Take 1 tablet (50 mg total) by mouth every 6 (six) hours.  ? oxyCODONE (OXY IR/ROXICODONE) 5 MG immediate release tablet Take 1 tablet by mouth two times daily as needed for severe pain  ? [DISCONTINUED] oxyCODONE (OXY IR/ROXICODONE) 5 MG immediate release tablet Take 1 tablet by mouth two times daily as needed for severe pain  ? ?No facility-administered encounter medications on file as of 01/21/2022.  ? ? ?Surgical  History: ?Past Surgical History:  ?Procedure Laterality Date  ? CARDIAC CATHETERIZATION    ? CATARACT EXTRACTION Bilateral   ? COLONOSCOPY    ? FRACTURE SURGERY Left   ? pins rods left wrist  ? HARDWARE REMOVAL Right 04/30/2021  ? Procedure: HARDWARE REMOVAL, Right tibia IM nail removal;  Surgeon: Kennedy Bucker, MD;  Location: [facility] ORS;  Service: Orthopedics;  Laterality: Right;  ? HERNIA REPAIR Bilateral   ? LEG SURGERY Right   ? rod in leg  ? TOTAL KNEE ARTHROPLASTY Right 10/02/2021  ? Procedure: TOTAL KNEE ARTHROPLASTY;  Surgeon: Kennedy Bucker, MD;  Location: [facility] ORS;  Service: Orthopedics;  Laterality: Right;  ? UPPER GI ENDOSCOPY    ? ? ?Medical History: ?Past Medical History:  ?Diagnosis Date  ? Anginal pain (HCC)   ? Anxiety   ? Aortic atherosclerosis (HCC)   ? Arthritis   ? Atrial fibrillation (HCC)   ? a.) CHA2DS2-VASc Score = 2 (HTN, vascular disease/prior MI).  b.) Rate/rhythm maintained on metoprolol succinate; chronically anticoagulated with rivaroxaban.  ? CAD (coronary artery disease)   ? Carotid artery stenosis   ? Cirrhosis (HCC) 06/15/2020  ? Complication of anesthesia   ? a.) seizure like activity with propofol during colonoscopy  ? COPD (chronic obstructive pulmonary disease) (HCC)   ? Current use of long term anticoagulation   ? a.) Rivaroxaban  ? Diastolic dysfunction 02/25/2020  ? a.) TTE 02/25/2020: normal LV function; EF 55-65%; GLS -15.8%; G1DD.  ? Dyspnea   ? GERD (gastroesophageal reflux disease)   ? Hemochromatosis   ? a.) Two copies of the same mutation (H63D and H63D) identified; (HH) mutations C282Y, H63D, and S65C. C282Y and S65C were negative.  ? Hepatitis C 06/15/2020  ? a.) Tx'd with Harvoni course  ? HLD (hyperlipidemia)   ? Hypertension   ? Hypothyroidism   ? Lung nodule   ? Myocardial infarction [facility]) 2019  ? x2 MI's no stents  ? Thrombocytopenia (HCC)   ? ? ?Family History: ?Family History  ?Problem Relation Age of Onset  ? Cancer Mother   ? COPD Mother   ? Cancer Sister    ? ? ?Social History  ? ?Socioeconomic History  ? Marital status: Single  ?  Spouse name: Not on file  ? Number of children: Not on file  ? Years of education: Not on file  ? Highest education level: Not on file  ?Occupational History  ? Not on file  ?Tobacco Use  ? Smoking status: Every Day  ?  Packs/day: 1.00  ?  Years: 50.00  ?  Pack years: 50.00  ?  Types: Cigarettes  ? Smokeless tobacco: Never  ? Tobacco comments:  ?  Smokes about 4 cigarettes daily  ?Vaping Use  ? Vaping Use: Never used  ?Substance and Sexual Activity  ? Alcohol use: Never  ?  Comment: Quit  ? Drug use: Never  ? Sexual activity: Not on file  ?Other Topics Concern  ?  Not on file  ?Social History Narrative  ? Alcohol/beer once/twice a month; 1/2 ppd; used to work in [employer]. In [place]; with sister.   ? ?Social Determinants of Health  ? ?Financial Resource Strain: Not on file  ?Food Insecurity: Not on file  ?Transportation Needs: Not on file  ?Physical Activity: Not on file  ?Stress: Not on file  ?Social Connections: Not on file  ?Intimate Partner Violence: Not on file  ? ? ? ? ?Review of Systems  ?Constitutional:  Negative for chills, fatigue and unexpected weight change.  ?HENT:  Negative for congestion, postnasal drip, rhinorrhea, sneezing and sore throat.   ?Eyes:  Negative for redness.  ?Respiratory:  Negative for cough, chest tightness and shortness of breath.   ?Cardiovascular:  Negative for chest pain and palpitations.  ?Gastrointestinal:  Negative for abdominal pain, constipation, diarrhea, nausea and vomiting.  ?Genitourinary:  Negative for dysuria and frequency.  ?Musculoskeletal:  Positive for arthralgias, back pain and gait problem. Negative for joint swelling and neck pain.  ?Skin:  Negative for rash.  ?Neurological:  Negative for tremors and numbness.  ?Hematological:  Negative for adenopathy. Does not bruise/bleed easily.  ?Psychiatric/Behavioral:  Positive for sleep disturbance. Negative for behavioral problems  (Depression) and suicidal ideas. The patient is nervous/anxious.   ? ?Vital Signs: ?BP 131/78   Pulse 93   Temp 97.6 ?F (36.4 ?C)   Resp 16   Ht 5\' 5"  (1.651 m)   Wt 158 lb (71.7 kg)   SpO2 96%   BMI 26.29 kg/m?  ? ? ?Physi

## 2022-01-26 ENCOUNTER — Telehealth: Payer: Self-pay

## 2022-01-26 NOTE — Telephone Encounter (Signed)
Pain management referral faxed to Dr. Ronita Hipps [phone] ?

## 2022-01-29 ENCOUNTER — Telehealth: Payer: Self-pay

## 2022-01-29 ENCOUNTER — Other Ambulatory Visit: Payer: Self-pay | Admitting: Internal Medicine

## 2022-01-29 DIAGNOSIS — E782 Mixed hyperlipidemia: Secondary | ICD-10-CM

## 2022-01-29 NOTE — Telephone Encounter (Signed)
Pain management referral denied by Dr. Welton Flakes. Sent via [facility] to Dr. Aileen Fass in [place] ?

## 2022-02-01 ENCOUNTER — Telehealth: Payer: Self-pay

## 2022-02-02 ENCOUNTER — Telehealth: Payer: Self-pay

## 2022-02-02 NOTE — Telephone Encounter (Signed)
Bianca from Dr. Kathlene Cote office  said, he did not give a reason why he declined. ?

## 2022-02-02 NOTE — Telephone Encounter (Signed)
Pain management referral sent via [facility] to [facility]-[place] ?

## 2022-02-02 NOTE — Telephone Encounter (Signed)
He was denied by Dr. Welton Flakes, because they do not accept any patients from [place]. He has also been declined by Dr. Lorrine Kin. Waiting for Marena Chancy to call be back as to why. ?

## 2022-02-08 ENCOUNTER — Ambulatory Visit
Admission: RE | Admit: 2022-02-08 | Discharge: 2022-02-08 | Disposition: A | Discharge status: Home / Self Care | Payer: Medicaid Other | Source: Ambulatory Visit | Attending: Internal Medicine | Admitting: Internal Medicine

## 2022-02-08 ENCOUNTER — Other Ambulatory Visit: Payer: Self-pay | Admitting: Physician Assistant

## 2022-02-08 ENCOUNTER — Telehealth: Payer: Self-pay

## 2022-02-08 DIAGNOSIS — Z8619 Personal history of other infectious and parasitic diseases: Secondary | ICD-10-CM | POA: Diagnosis present

## 2022-02-08 DIAGNOSIS — M25561 Pain in right knee: Secondary | ICD-10-CM

## 2022-02-08 DIAGNOSIS — M47816 Spondylosis without myelopathy or radiculopathy, lumbar region: Secondary | ICD-10-CM

## 2022-02-08 DIAGNOSIS — G8929 Other chronic pain: Secondary | ICD-10-CM

## 2022-02-08 IMAGING — US US ABDOMEN LIMITED
1 series · 14 of 25 positions shown · non-contrast
Comparison: Ultrasound [DATE]. MRI of [DATE].

CLINICAL DATA: Hepatitis-C.

EXAM:
ULTRASOUND ABDOMEN LIMITED RIGHT UPPER QUADRANT

[Series 1: us abdomen limited ruq (liver/gb) · 14 of 39 slices shown]
[im 1/39]
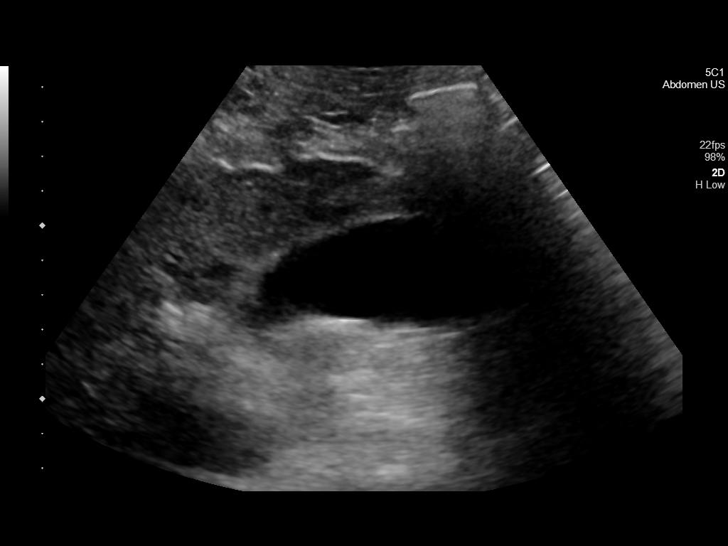
[im 4/39]
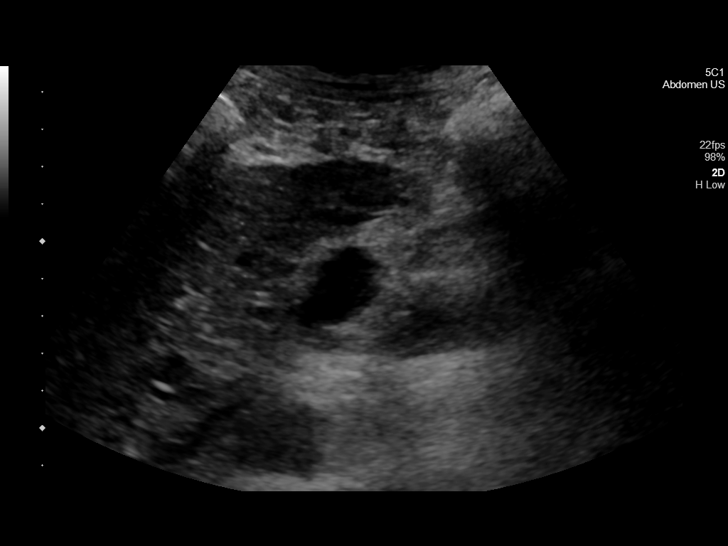
[im 7/39]
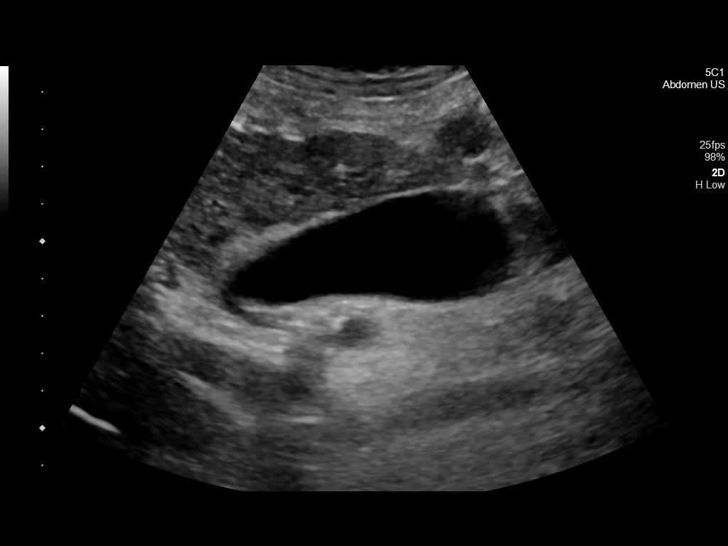
[im 10/39]
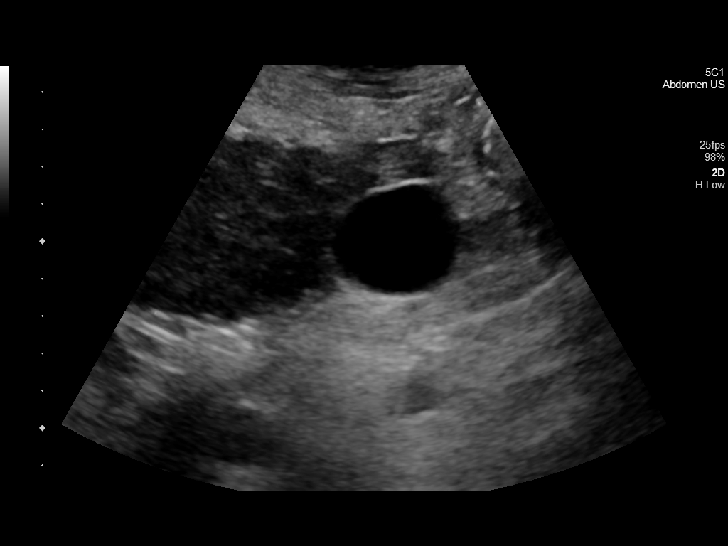
[im 13/39]
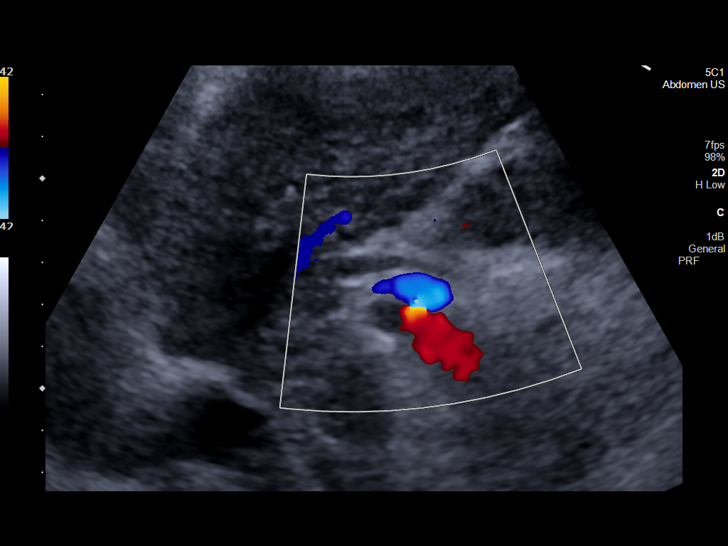
[im 15/39]
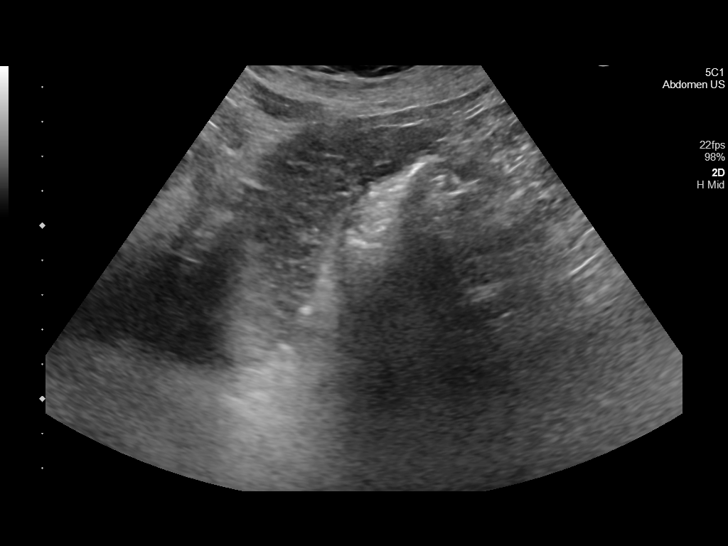
[im 18/39]
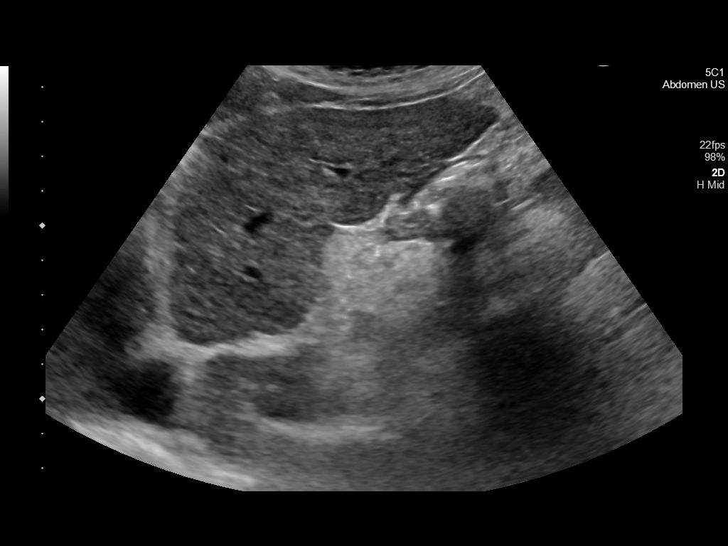
[im 21/39]
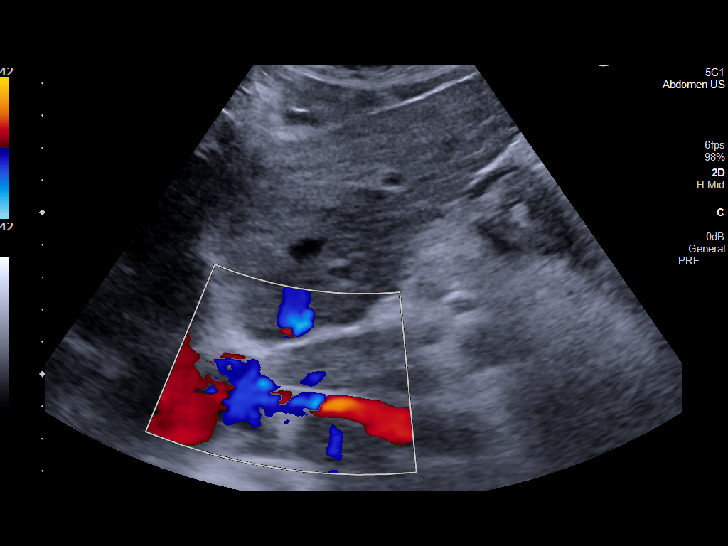
[im 24/39]
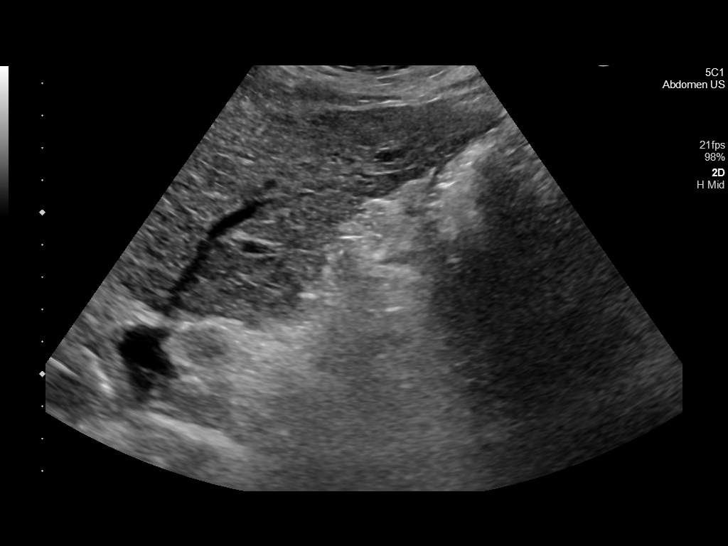
[im 26/39]
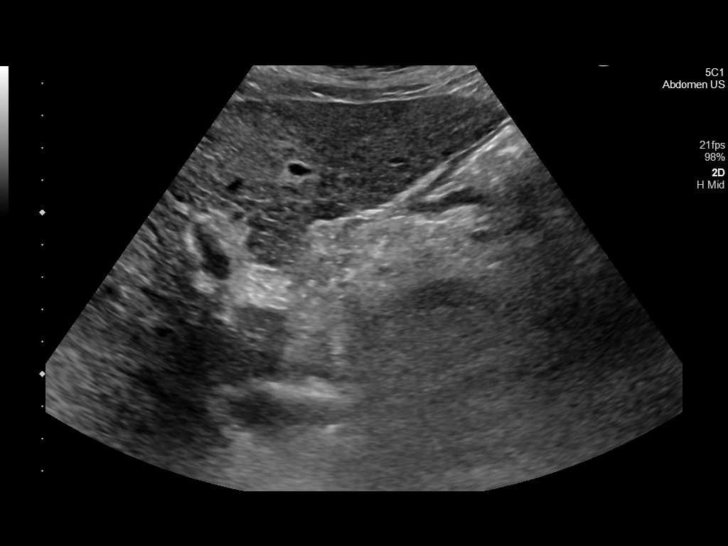
[im 29/39]
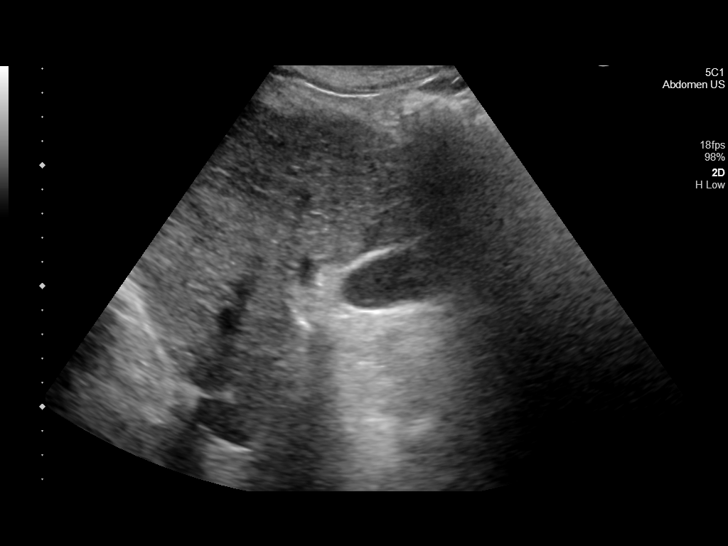
[im 32/39]
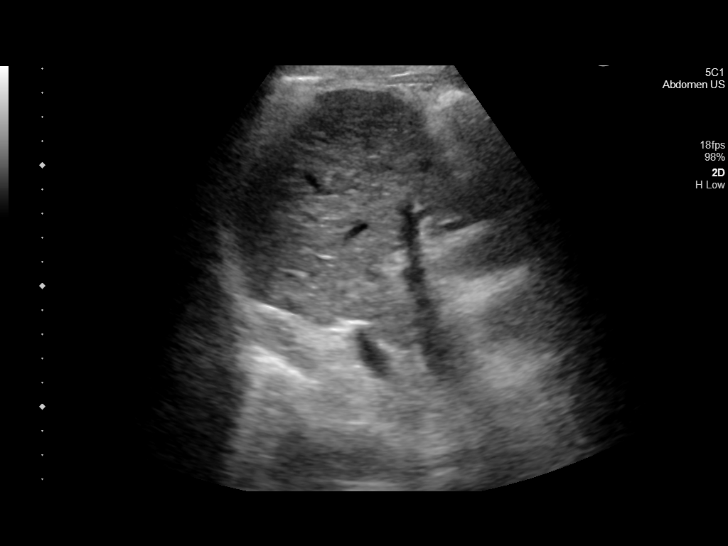
[im 35/39]
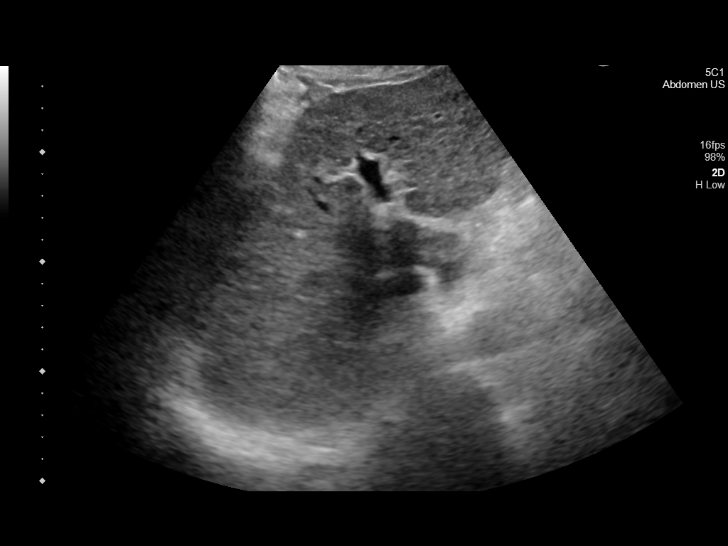
[im 39/39]
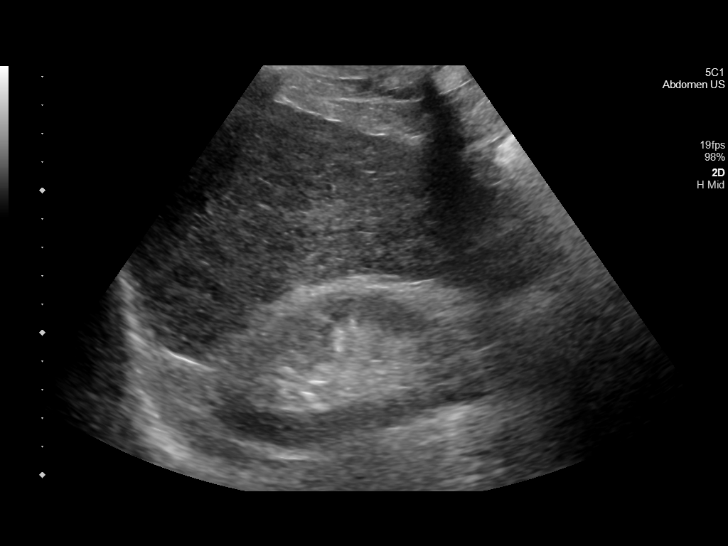

[14 of 25 positions shown; findings below may reference images not displayed]

FINDINGS: Gallbladder:

No gallstones or wall thickening visualized. No sonographic Murphy
sign noted by sonographer.

Common bile duct:

Diameter: 3 mm which is within normal limits.

Liver:

No focal lesion identified. Nodular hepatic contours are noted as
well as heterogeneous hepatic parenchyma suggesting hepatic
cirrhosis. Portal vein is patent on color Doppler imaging with
normal direction of blood flow towards the liver.

Other: None.
IMPRESSION: Findings consistent with hepatic cirrhosis. No definite focal
sonographic hepatic abnormality is noted.

## 2022-02-08 MED ORDER — OXYCODONE HCL 5 MG PO TABS: ORAL_TABLET | ORAL | 0 refills | Status: DC

## 2022-02-08 NOTE — Telephone Encounter (Signed)
Spoke to pt and informed him that Lauren sent prescription for pain meds to his pharmacy and that he has an appt already in 1 week ?

## 2022-02-08 NOTE — Telephone Encounter (Signed)
LMOM for pt to return my call.

## 2022-02-08 NOTE — Telephone Encounter (Signed)
Per Marquis Buggy, referral has been closed due to patient not returning calls to schedule appointment-[place] ?

## 2022-02-08 NOTE — Telephone Encounter (Signed)
Please let pt know he needs to find a pain management/ ortho for himself, if they take him, will be happy to send a referral and progress notes to that office, will not be able to give him controlled medications for more than 3 months

## 2022-02-12 ENCOUNTER — Telehealth: Payer: Self-pay

## 2022-02-12 NOTE — Telephone Encounter (Signed)
Pt called LMOM that he will no longer be coming to our office as a patient and that he has changed to [facility] for his primary care ?

## 2022-02-15 ENCOUNTER — Encounter: Payer: Medicaid Other | Admitting: Physician Assistant

## 2022-02-17 ENCOUNTER — Inpatient Hospital Stay: Payer: Medicaid Other | Attending: Internal Medicine

## 2022-02-17 DIAGNOSIS — Z809 Family history of malignant neoplasm, unspecified: Secondary | ICD-10-CM | POA: Diagnosis not present

## 2022-02-17 DIAGNOSIS — G8929 Other chronic pain: Secondary | ICD-10-CM | POA: Insufficient documentation

## 2022-02-17 DIAGNOSIS — I252 Old myocardial infarction: Secondary | ICD-10-CM | POA: Diagnosis not present

## 2022-02-17 DIAGNOSIS — Z885 Allergy status to narcotic agent status: Secondary | ICD-10-CM | POA: Insufficient documentation

## 2022-02-17 DIAGNOSIS — I480 Paroxysmal atrial fibrillation: Secondary | ICD-10-CM | POA: Insufficient documentation

## 2022-02-17 DIAGNOSIS — I10 Essential (primary) hypertension: Secondary | ICD-10-CM | POA: Diagnosis not present

## 2022-02-17 DIAGNOSIS — M791 Myalgia, unspecified site: Secondary | ICD-10-CM | POA: Diagnosis not present

## 2022-02-17 DIAGNOSIS — Z7901 Long term (current) use of anticoagulants: Secondary | ICD-10-CM | POA: Diagnosis not present

## 2022-02-17 DIAGNOSIS — F17210 Nicotine dependence, cigarettes, uncomplicated: Secondary | ICD-10-CM | POA: Diagnosis not present

## 2022-02-17 DIAGNOSIS — R5383 Other fatigue: Secondary | ICD-10-CM | POA: Diagnosis not present

## 2022-02-17 DIAGNOSIS — K7460 Unspecified cirrhosis of liver: Secondary | ICD-10-CM | POA: Diagnosis present

## 2022-02-17 DIAGNOSIS — Z79899 Other long term (current) drug therapy: Secondary | ICD-10-CM | POA: Diagnosis not present

## 2022-02-17 DIAGNOSIS — E785 Hyperlipidemia, unspecified: Secondary | ICD-10-CM | POA: Insufficient documentation

## 2022-02-17 DIAGNOSIS — Z836 Family history of other diseases of the respiratory system: Secondary | ICD-10-CM | POA: Insufficient documentation

## 2022-02-17 DIAGNOSIS — R772 Abnormality of alphafetoprotein: Secondary | ICD-10-CM | POA: Diagnosis not present

## 2022-02-17 DIAGNOSIS — E039 Hypothyroidism, unspecified: Secondary | ICD-10-CM | POA: Insufficient documentation

## 2022-02-17 DIAGNOSIS — B1920 Unspecified viral hepatitis C without hepatic coma: Secondary | ICD-10-CM | POA: Diagnosis present

## 2022-02-17 DIAGNOSIS — M549 Dorsalgia, unspecified: Secondary | ICD-10-CM | POA: Insufficient documentation

## 2022-02-17 DIAGNOSIS — Z8619 Personal history of other infectious and parasitic diseases: Secondary | ICD-10-CM

## 2022-02-17 DIAGNOSIS — M2550 Pain in unspecified joint: Secondary | ICD-10-CM | POA: Diagnosis not present

## 2022-02-17 LAB — CBC WITH DIFFERENTIAL/PLATELET
Abs Immature Granulocytes: 0.03 10*3/uL (ref 0.00–0.07)
Basophils Absolute: 0.1 10*3/uL (ref 0.0–0.1)
Basophils Relative: 1 %
Eosinophils Absolute: 0.2 10*3/uL (ref 0.0–0.5)
Eosinophils Relative: 2 %
HCT: 42.9 % (ref 39.0–52.0)
Hemoglobin: 15 g/dL (ref 13.0–17.0)
Immature Granulocytes: 0 %
Lymphocytes Relative: 15 %
Lymphs Abs: 1.6 10*3/uL (ref 0.7–4.0)
MCH: 31.3 pg (ref 26.0–34.0)
MCHC: 35 g/dL (ref 30.0–36.0)
MCV: 89.6 fL (ref 80.0–100.0)
Monocytes Absolute: 1.3 10*3/uL — ABNORMAL HIGH (ref 0.1–1.0)
Monocytes Relative: 12 %
Neutro Abs: 7.3 10*3/uL (ref 1.7–7.7)
Neutrophils Relative %: 70 %
Platelets: 116 10*3/uL — ABNORMAL LOW (ref 150–400)
RBC: 4.79 MIL/uL (ref 4.22–5.81)
RDW: 13.3 % (ref 11.5–15.5)
WBC: 10.5 10*3/uL (ref 4.0–10.5)
nRBC: 0 % (ref 0.0–0.2)

## 2022-02-17 LAB — IRON AND TIBC
Iron: 105 ug/dL (ref 45–182)
Saturation Ratios: 26 % (ref 17.9–39.5)
TIBC: 410 ug/dL (ref 250–450)
UIBC: 305 ug/dL

## 2022-02-17 LAB — COMPREHENSIVE METABOLIC PANEL
ALT: 17 U/L (ref 0–44)
AST: 22 U/L (ref 15–41)
Albumin: 3.7 g/dL (ref 3.5–5.0)
Alkaline Phosphatase: 70 U/L (ref 38–126)
Anion gap: 6 (ref 5–15)
BUN: 16 mg/dL (ref 8–23)
CO2: 24 mmol/L (ref 22–32)
Calcium: 9.2 mg/dL (ref 8.9–10.3)
Chloride: 102 mmol/L (ref 98–111)
Creatinine, Ser: 0.89 mg/dL (ref 0.61–1.24)
GFR, Estimated: >60 mL/min (ref >60)
Glucose, Bld: 115 mg/dL — ABNORMAL HIGH (ref 70–99)
Potassium: 3.4 mmol/L — ABNORMAL LOW (ref 3.5–5.1)
Sodium: 132 mmol/L — ABNORMAL LOW (ref 135–145)
Total Bilirubin: 1.1 mg/dL (ref 0.3–1.2)
Total Protein: 7.7 g/dL (ref 6.5–8.1)

## 2022-02-17 LAB — FERRITIN: Ferritin: 42 ng/mL (ref 24–336)

## 2022-02-18 LAB — AFP TUMOR MARKER: AFP, Serum, Tumor Marker: 2.8 ng/mL (ref 0.0–8.4)

## 2022-02-24 ENCOUNTER — Inpatient Hospital Stay: Payer: Medicaid Other

## 2022-02-24 ENCOUNTER — Inpatient Hospital Stay (HOSPITAL_BASED_OUTPATIENT_CLINIC_OR_DEPARTMENT_OTHER): Payer: Medicaid Other | Admitting: Internal Medicine

## 2022-02-24 ENCOUNTER — Encounter: Payer: Self-pay | Admitting: Internal Medicine

## 2022-02-24 ENCOUNTER — Telehealth: Payer: Self-pay | Admitting: *Deleted

## 2022-02-24 DIAGNOSIS — M5442 Lumbago with sciatica, left side: Secondary | ICD-10-CM | POA: Diagnosis not present

## 2022-02-24 DIAGNOSIS — M25561 Pain in right knee: Secondary | ICD-10-CM

## 2022-02-24 DIAGNOSIS — G8929 Other chronic pain: Secondary | ICD-10-CM

## 2022-02-24 DIAGNOSIS — M47816 Spondylosis without myelopathy or radiculopathy, lumbar region: Secondary | ICD-10-CM | POA: Diagnosis not present

## 2022-02-24 MED ORDER — OXYCODONE HCL 5 MG PO TABS: ORAL_TABLET | ORAL | 0 refills | Status: DC

## 2022-02-24 NOTE — Progress Notes (Signed)
Needs refill of xarelto. ? ?Pt would like to know if you can rx something for his pain? ? ? ? ? ?

## 2022-02-24 NOTE — Telephone Encounter (Signed)
Call from [facility] stating that plain Oxycodone 5 mg is on backorder. Asking if you want to order something or try another pharmacy. She does say that they have Oxy 10 mg on hand or Percocet 5/325 mg on hand. Please advise/ send new prescription  ?

## 2022-02-24 NOTE — Progress Notes (Signed)
[facility] ?CONSULT NOTE ? ?Patient Care Team: ?Pcp, No as PCP - General ?Debbe Odea, MD as PCP - Cardiology (Cardiology) ? ?CHIEF COMPLAINTS/PURPOSE OF CONSULTATION: Hemochromatosis. ? ? ?HEMATOLOGY HISTORY ? 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  Two copies of H63D were identified.  C282Y and S65C were negative;Elevated Ferritin/I sat-88%;  SEP 2021- s/p Phlebotomy x3 ? ?# Elevated AFP- 73 [SEP 2021]; NOV 2021- MRI- liver NEG; ? ?# Cirrhosis/ HEP C [started end of sep,2021 x 12 weeks; Dr.Wohl] ? ?# CAD- on xarelto.  ? ?HISTORY OF PRESENTING ILLNESS: Ambulating independently.  Alone. ?Gabriel Hoffman 66 y.o.  male history of hepatitis C/cirrhosis splenomegaly and history of hereditary hemochromatosis/elevated iron saturation/ferritin is here for follow-up/review results of the liver ultrasound. ? ?Patient denies any worsening abdominal pain nausea vomiting diarrhea.  Swelling the legs. ? ?Patient continues to have chronic back pain joint pains.  He is in the process of switching providers/awaiting to see pain clinic.  Request pain medication interim. ? ?Review of Systems  ?Constitutional:  Positive for malaise/fatigue. Negative for chills, diaphoresis, fever and weight loss.  ?HENT:  Negative for nosebleeds and sore throat.   ?Eyes:  Negative for double vision.  ?Respiratory:  Negative for cough, hemoptysis, sputum production, shortness of breath and wheezing.   ?Cardiovascular:  Negative for chest pain, palpitations, orthopnea and leg swelling.  ?Gastrointestinal:  Negative for abdominal pain, blood in stool, constipation, diarrhea, heartburn, melena, nausea and vomiting.  ?Genitourinary:  Negative for dysuria, frequency and urgency.  ?Musculoskeletal:  Positive for back pain, joint pain and myalgias.  ?Skin: Negative.  Negative for itching and rash.  ?Neurological:  Negative for  dizziness, tingling, focal weakness, weakness and headaches.  ?Endo/Heme/Allergies:  Does not bruise/bleed easily.  ?Psychiatric/Behavioral:  Negative for depression. The patient is not nervous/anxious and does not have insomnia.   ? ?MEDICAL HISTORY:  ?Past Medical History:  ?Diagnosis Date  ? Anginal pain (HCC)   ? Anxiety   ? Aortic atherosclerosis (HCC)   ? Arthritis   ? Atrial fibrillation (HCC)   ? a.) CHA2DS2-VASc Score = 2 (HTN, vascular disease/prior MI).  b.) Rate/rhythm maintained on metoprolol succinate; chronically anticoagulated with rivaroxaban.  ? CAD (coronary artery disease)   ? Carotid artery stenosis   ? Cirrhosis (HCC) 06/15/2020  ? Complication of anesthesia   ? a.) seizure like activity with propofol during colonoscopy  ? COPD (chronic obstructive pulmonary disease) (HCC)   ? Current use of long term anticoagulation   ? a.) Rivaroxaban  ? Diastolic dysfunction 02/25/2020  ? a.) TTE 02/25/2020: normal LV function; EF 55-65%; GLS -15.8%; G1DD.  ? Dyspnea   ? GERD (gastroesophageal reflux disease)   ? Hemochromatosis   ? a.) Two copies of the same mutation (H63D and H63D) identified; (HH) mutations C282Y, H63D, and S65C. C282Y and S65C were negative.  ? Hepatitis C 06/15/2020  ? a.) Tx'd with Harvoni course  ? HLD (hyperlipidemia)   ? Hypertension   ? Hypothyroidism   ? Lung nodule   ? Myocardial infarction [facility]) 2019  ? x2 MI's no stents  ? Thrombocytopenia (HCC)   ? ? ?SURGICAL HISTORY: ?Past Surgical History:  ?Procedure Laterality Date  ? CARDIAC CATHETERIZATION    ? CATARACT EXTRACTION Bilateral   ? COLONOSCOPY    ? FRACTURE SURGERY Left   ? pins rods left wrist  ? HARDWARE REMOVAL Right 04/30/2021  ?  Procedure: HARDWARE REMOVAL, Right tibia IM nail removal;  Surgeon: Kennedy Bucker, MD;  Location: [facility] ORS;  Service: Orthopedics;  Laterality: Right;  ? HERNIA REPAIR Bilateral   ? LEG SURGERY Right   ? rod in leg  ? TOTAL KNEE ARTHROPLASTY Right 10/02/2021  ? Procedure: TOTAL KNEE  ARTHROPLASTY;  Surgeon: Kennedy Bucker, MD;  Location: [facility] ORS;  Service: Orthopedics;  Laterality: Right;  ? UPPER GI ENDOSCOPY    ? ? ?SOCIAL HISTORY: ?Social History  ? ?Socioeconomic History  ? Marital status: Single  ?  Spouse name: Not on file  ? Number of children: Not on file  ? Years of education: Not on file  ? Highest education level: Not on file  ?Occupational History  ? Not on file  ?Tobacco Use  ? Smoking status: Every Day  ?  Packs/day: 1.00  ?  Years: 50.00  ?  Pack years: 50.00  ?  Types: Cigarettes  ? Smokeless tobacco: Never  ? Tobacco comments:  ?  Smokes about 4 cigarettes daily  ?Vaping Use  ? Vaping Use: Never used  ?Substance and Sexual Activity  ? Alcohol use: Never  ?  Comment: Quit  ? Drug use: Never  ? Sexual activity: Not on file  ?Other Topics Concern  ? Not on file  ?Social History Narrative  ? Alcohol/beer once/twice a month; 1/2 ppd; used to work in [employer]. In [place]; with sister.   ? ?Social Determinants of Health  ? ?Financial Resource Strain: Not on file  ?Food Insecurity: Not on file  ?Transportation Needs: Not on file  ?Physical Activity: Not on file  ?Stress: Not on file  ?Social Connections: Not on file  ?Intimate Partner Violence: Not on file  ? ? ?FAMILY HISTORY: ?Family History  ?Problem Relation Age of Onset  ? Cancer Mother   ? COPD Mother   ? Cancer Sister   ? ? ?ALLERGIES:  is allergic to codeine and propofol. ? ?MEDICATIONS:  ?Current Outpatient Medications  ?Medication Sig Dispense Refill  ? ALPRAZolam (XANAX) 0.5 MG tablet May take 1-1.5 tabs by mouth at night before bed as needed 45 tablet 2  ? atorvastatin (LIPITOR) 10 MG tablet TAKE 1 TABLET DAILY. 90 tablet 1  ? buPROPion (WELLBUTRIN XL) 150 MG 24 hr tablet Take 150 mg by mouth daily.    ? escitalopram (LEXAPRO) 10 MG tablet Take 1 tablet (10 mg total) by mouth daily. 90 tablet 1  ? gabapentin (NEURONTIN) 300 MG capsule Take 300 mg by mouth 3 (three) times daily.    ? hydrochlorothiazide (HYDRODIURIL)  25 MG tablet TAKE 1 TABLET DAILY. 90 tablet 1  ? Hypromell-Glycerin-Naphazoline (CLEAR EYES FOR DRY EYES PLUS) 0.8-0.25-0.012 % SOLN Place 1-2 drops into both eyes 3 (three) times daily as needed (dry/irritated eyes).    ? levothyroxine (SYNTHROID) 125 MCG tablet TAKE 1 TABLET DAILY. 90 tablet 0  ? lisinopril (ZESTRIL) 10 MG tablet TAKE 1 TABLET DAILY. 90 tablet 1  ? metoprolol succinate (TOPROL-XL) 100 MG 24 hr tablet Take 100 mg by mouth daily.    ? nitroGLYCERIN (NITROSTAT) 0.4 MG SL tablet Place 0.4 mg under the tongue every 5 (five) minutes x 3 doses as needed for chest pain.    ? pantoprazole (PROTONIX) 20 MG tablet Take 1 tablet by mouth once daily 90 tablet 1  ? rivaroxaban (XARELTO) 20 MG TABS tablet Take 1 tablet (20 mg total) by mouth in the morning.    ? oxyCODONE (OXY IR/ROXICODONE) 5 MG immediate  release tablet Take 1 tablet by mouth two times daily as needed for severe pain 30 tablet 0  ? ?No current facility-administered medications for this visit.  ? ? ? ? ?PHYSICAL EXAMINATION: ? ? ?Vitals:  ? 02/24/22 1331  ?BP: 128/74  ?Pulse: 73  ?Temp: 98 ?F (36.7 ?C)  ?SpO2: 97%  ? ?Filed Weights  ? 02/24/22 1331  ?Weight: 160 lb 6.4 oz (72.8 kg)  ? ? ?Physical Exam ?HENT:  ?   Head: Normocephalic and atraumatic.  ?   Mouth/Throat:  ?   Pharynx: No oropharyngeal exudate.  ?Eyes:  ?   Pupils: Pupils are equal, round, and reactive to light.  ?Cardiovascular:  ?   Rate and Rhythm: Normal rate and regular rhythm.  ?Pulmonary:  ?   Effort: No respiratory distress.  ?   Breath sounds: No wheezing.  ?   Comments: Decreased air entry bilaterally.  ?Abdominal:  ?   General: Bowel sounds are normal. There is no distension.  ?   Palpations: Abdomen is soft. There is no mass.  ?   Tenderness: There is no abdominal tenderness. There is no guarding or rebound.  ?Musculoskeletal:     ?   General: No tenderness. Normal range of motion.  ?   Cervical back: Normal range of motion and neck supple.  ?Skin: ?   General: Skin is  warm.  ?Neurological:  ?   Mental Status: He is alert and oriented to person, place, and time.  ?Psychiatric:     ?   Mood and Affect: Affect normal.  ? ? ?LABORATORY DATA:  ?I have reviewed the data as listed ?Lab Resu

## 2022-02-24 NOTE — Assessment & Plan Note (Addendum)
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  March 2023 iron  Studies- iron sat- 43%.   ? ?# HOLD off phlebotomy at this time.  ? ?# Elevated AFP-question secondary cirrhosis; SEP 2021-MRI liver negative for any lesions. MARCH 2023- AFP-WNL.  Ultrasound-April 2023-negative.  Continue surveillance. ? ?# Hepatitis C/cirrhosis-child Pugh-A- s/p hep C therapy. Defer to Gi [Dr.Wohl]. STABLE  ? ?# Active smoker- trying to quit/ Recommend quitting smoking/LCSP [April 2022]- ? ?# Paroxysmal atrial fibrillation [Dr.Etang] s/p ablation September 2020 at [facility]. on BB , Xarelto- defer to cardiology for efills ? ?# chronic pain/ back pain- refilled-as patient is trying to switch providers/awaiting evaluation in the pain clinic.  Patient aware that he will not get any more refills from [place] moving forward. ? ?# DISPOSITION: ?# NO phlebotomy today ?# follow up in 6 months- MD; 1 week prior-labs- cbc/cmp;Iron studies/ferritin; AFP; [place] abodmen prior-; possible phlebotomy-Dr.B ? ? ? ?

## 2022-02-24 NOTE — Progress Notes (Signed)
Per MD, no phlebotomy today. 

## 2022-02-25 ENCOUNTER — Other Ambulatory Visit: Payer: Self-pay

## 2022-02-25 ENCOUNTER — Telehealth: Payer: Self-pay

## 2022-02-25 ENCOUNTER — Telehealth: Payer: Self-pay | Admitting: *Deleted

## 2022-02-25 MED ORDER — OXYCODONE-ACETAMINOPHEN 5-325 MG PO TABS: 1.0000 | ORAL_TABLET | Freq: Two times a day (BID) | ORAL | 0 refills | Status: DC | PRN

## 2022-02-25 NOTE — Telephone Encounter (Signed)
-----   Message from Reggy Eye sent at 02/25/2022  2:34 PM EDT ----- ?Regarding: PRIOR AUTHORIZATION- [facility] ?PRIOR AUTHORIZATION- [facility] sent to the chart. ? ?

## 2022-02-25 NOTE — Telephone Encounter (Signed)
Patient informed that new rx sent to [facility] ?

## 2022-02-25 NOTE — Telephone Encounter (Signed)
Replacing the Oxycodone 5 mg sent 02/24/22 due to med on backorder ?

## 2022-02-25 NOTE — Telephone Encounter (Signed)
Prior auth submitted via online, covermymeds. ?

## 2022-02-25 NOTE — Telephone Encounter (Signed)
Patient informed that new rx sent to [facility] ?

## 2022-02-25 NOTE — Telephone Encounter (Signed)
Percocet 5/325 with same directions pended for MD to send to pharmacy.  ?

## 2022-02-25 NOTE — Telephone Encounter (Signed)
Percocet 5/325 with same directions pended for MD to send to pharmacy.  ?

## 2022-02-25 NOTE — Telephone Encounter (Signed)
Pt called and left voicemail this morning that pharmacy, [facility], did not have oxycodone 5 mg and was wanting oxycodone 10 mg prescription sent to pharmacy.   ? ?RN noted message from pharmacy yesterday via triage saying prescription could be sent to a different pharmacy to be filled or they had other options available.   ?

## 2022-02-26 ENCOUNTER — Encounter: Payer: Self-pay | Admitting: Internal Medicine

## 2022-03-04 ENCOUNTER — Other Ambulatory Visit: Payer: Self-pay

## 2022-03-04 DIAGNOSIS — Z87891 Personal history of nicotine dependence: Secondary | ICD-10-CM

## 2022-03-04 DIAGNOSIS — Z122 Encounter for screening for malignant neoplasm of respiratory organs: Secondary | ICD-10-CM

## 2022-03-04 DIAGNOSIS — F17200 Nicotine dependence, unspecified, uncomplicated: Secondary | ICD-10-CM

## 2022-03-11 ENCOUNTER — Other Ambulatory Visit: Payer: Self-pay | Admitting: Physician Assistant

## 2022-03-11 DIAGNOSIS — F411 Generalized anxiety disorder: Secondary | ICD-10-CM

## 2022-03-15 ENCOUNTER — Ambulatory Visit
Admission: RE | Admit: 2022-03-15 | Discharge: 2022-03-15 | Disposition: A | Discharge status: Home / Self Care | Payer: Medicaid Other | Source: Ambulatory Visit | Attending: Acute Care | Admitting: Acute Care

## 2022-03-15 DIAGNOSIS — F17200 Nicotine dependence, unspecified, uncomplicated: Secondary | ICD-10-CM | POA: Diagnosis present

## 2022-03-15 DIAGNOSIS — Z122 Encounter for screening for malignant neoplasm of respiratory organs: Secondary | ICD-10-CM | POA: Diagnosis present

## 2022-03-15 DIAGNOSIS — Z87891 Personal history of nicotine dependence: Secondary | ICD-10-CM | POA: Diagnosis not present

## 2022-03-15 IMAGING — CT CT CHEST LUNG CANCER SCREENING LOW DOSE W/O CM
2 of 5 series · 15 of 40 positions shown, 18 images · non-contrast
Comparison: Low-dose lung cancer screening chest CT [DATE].

CLINICAL DATA: 65-year-old male current smoker with 52 pack-year
history of smoking. Lung cancer screening examination.



[Series 3: lung 1.00 · axial · 0.64mm/px · z∈[-1191,-897]mm · 12 of 324 slices shown, 15 images]
[im 15/324  mediastinal]
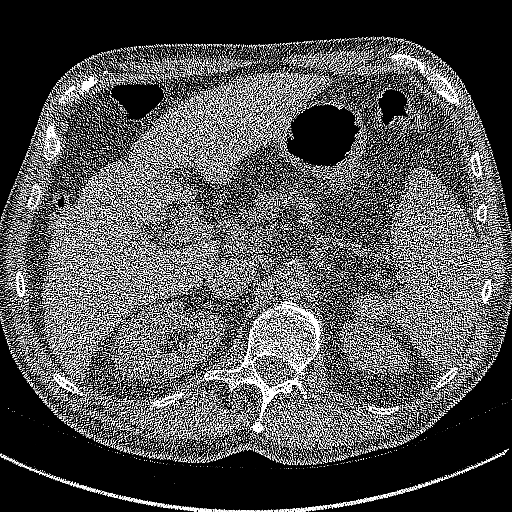
[im 15/324  lung]
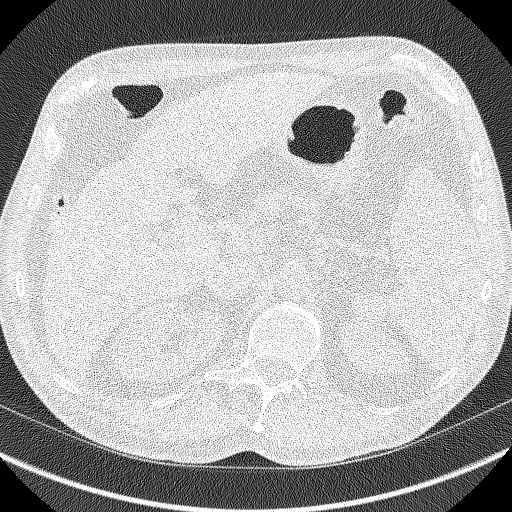
[im 45/324  lung]
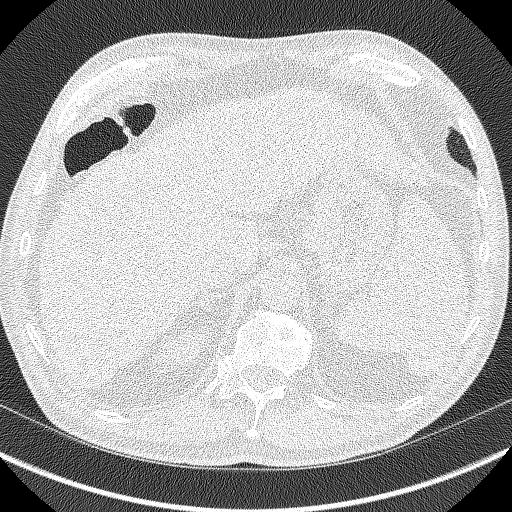
[im 74/324  lung]
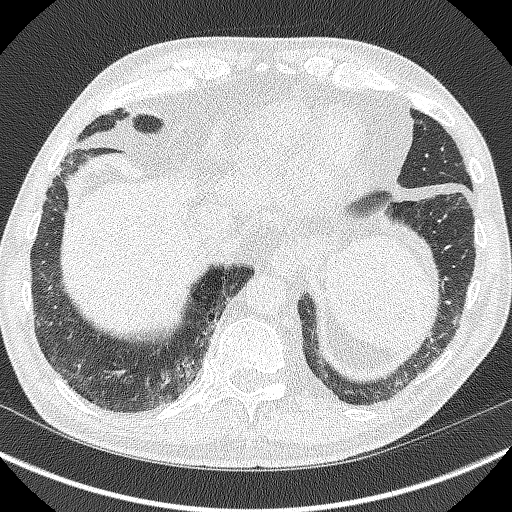
[im 103/324  lung]
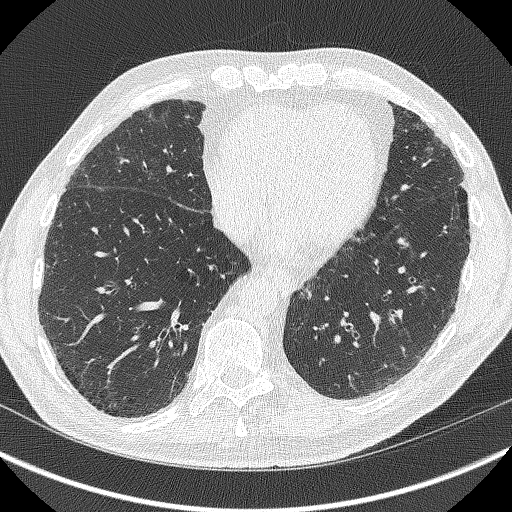
[im 118/324  mediastinal]
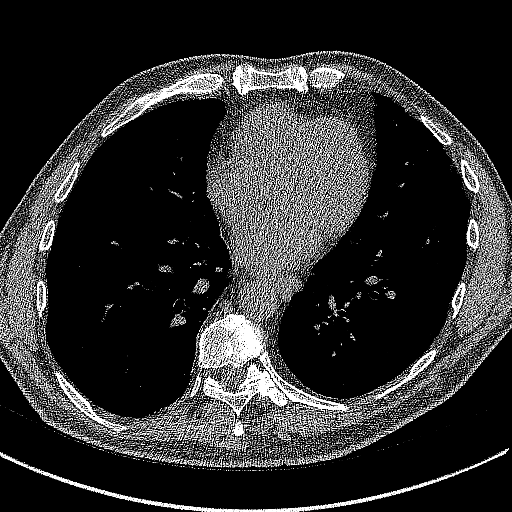
[im 118/324  lung]
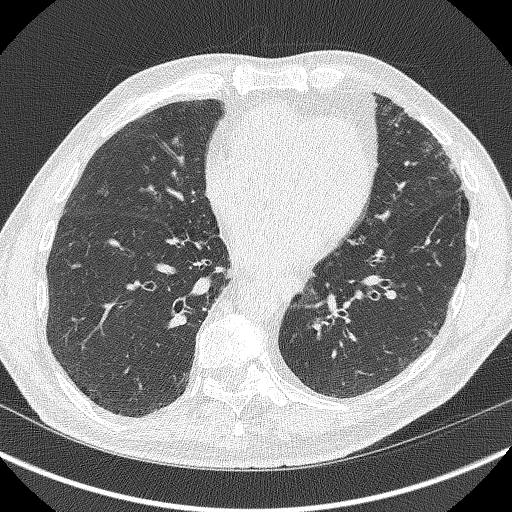
[im 147/324  lung]
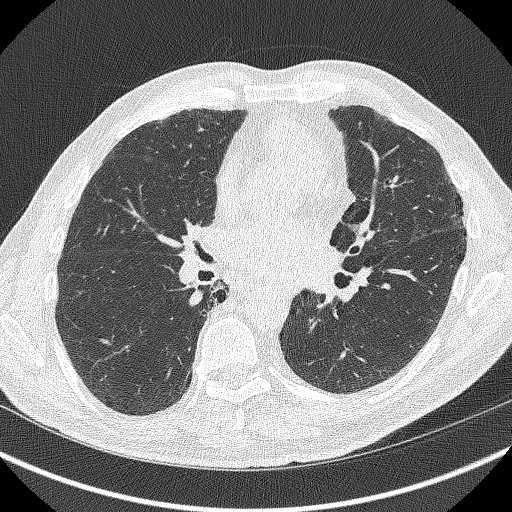
[im 177/324  lung]
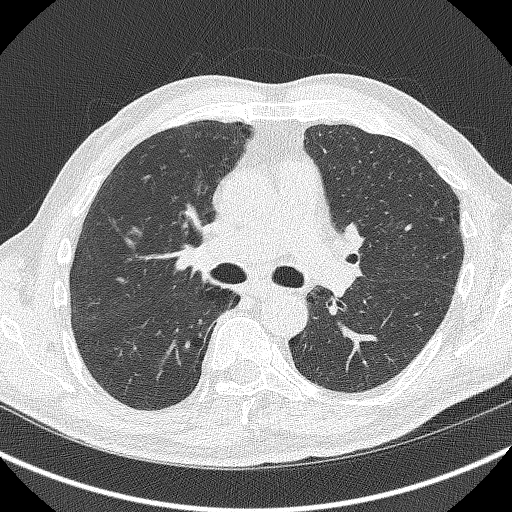
[im 206/324  lung]
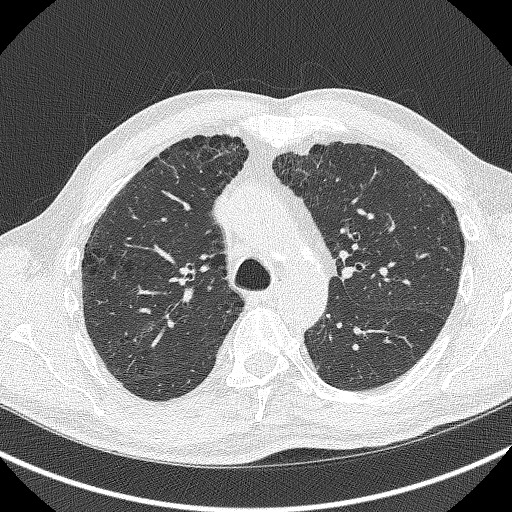
[im 221/324  mediastinal]
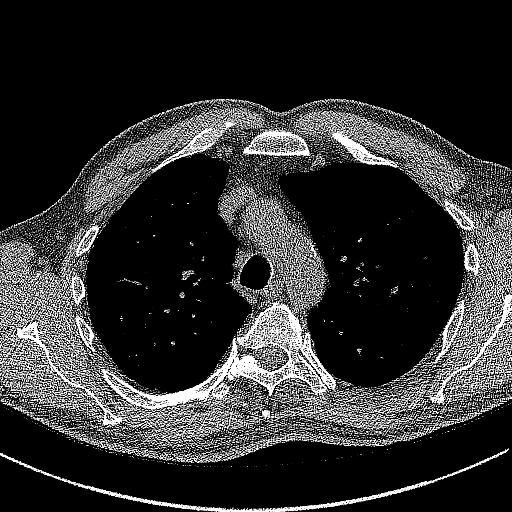
[im 221/324  lung]
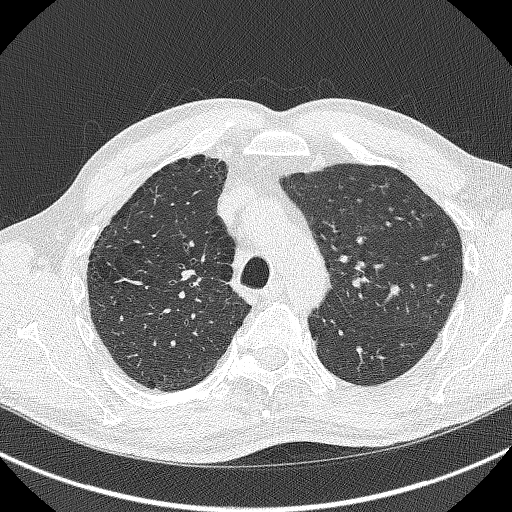
[im 250/324  lung]
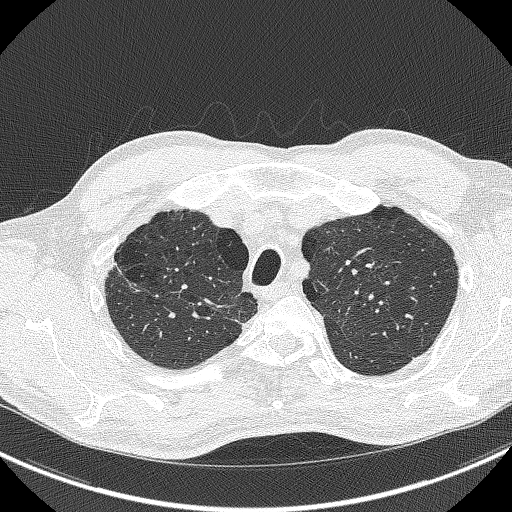
[im 279/324  lung]
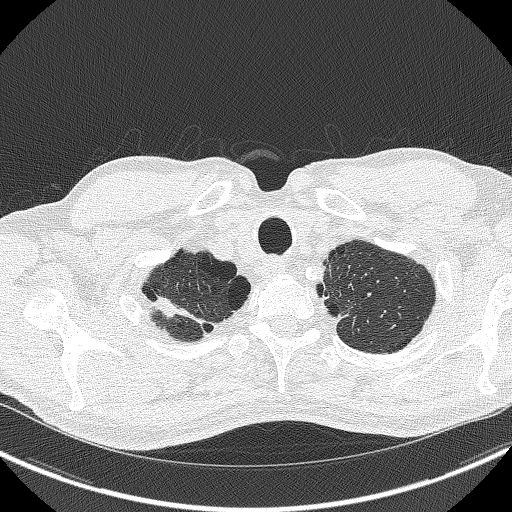
[im 309/324  lung]
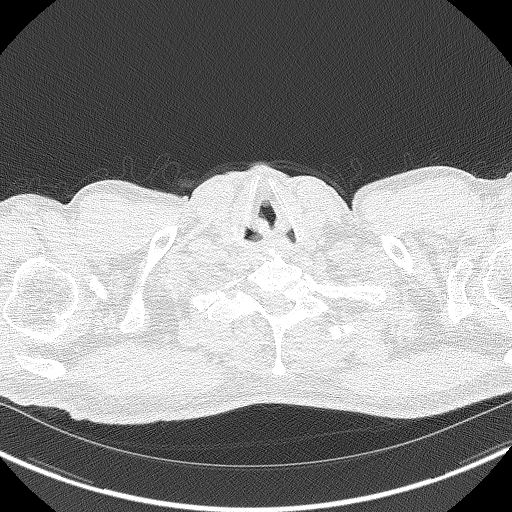

[Series 5: coronals lung 1.00 cor · coronal · 0.63mm/px · 3 of 285 slices shown]
[im 57/285  lung]
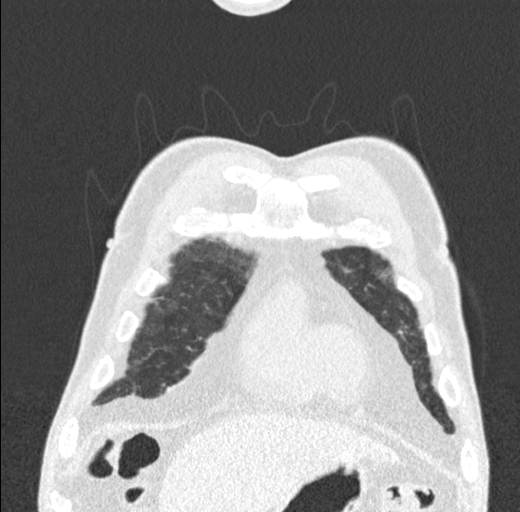
[im 114/285  lung]
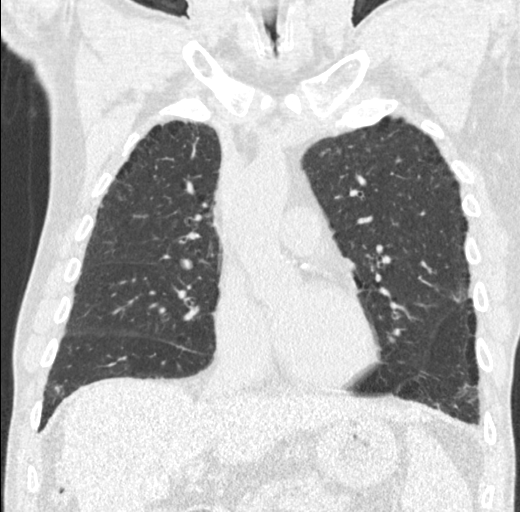
[im 171/285  lung]
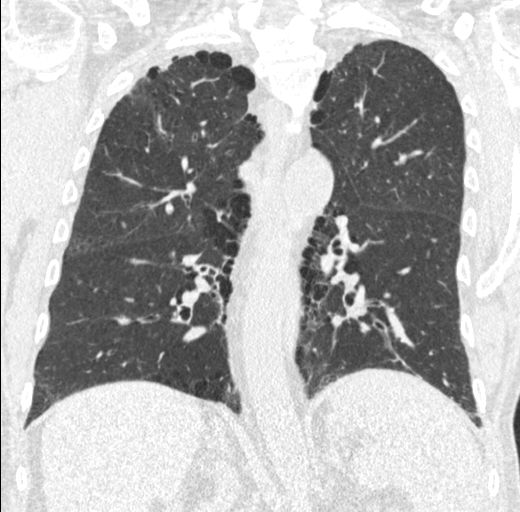

[15 of 40 positions shown; findings below may reference images not displayed]

FINDINGS: Cardiovascular: Heart size is normal. There is no significant
pericardial fluid, thickening or pericardial calcification. There is
aortic atherosclerosis, as well as atherosclerosis of the great
vessels of the mediastinum and the coronary arteries, including
calcified atherosclerotic plaque in the left main, left anterior
descending, left circumflex and right coronary arteries.

Mediastinum/Nodes: No pathologically enlarged mediastinal or hilar
lymph nodes. Esophagus is unremarkable in appearance. No axillary
lymphadenopathy.

Lungs/Pleura: Tiny pulmonary nodule in the periphery of the right
lower lobe (axial image 233 of series 3), with a volume derived mean
diameter of only 2.1 mm. No larger more suspicious appearing
pulmonary nodules or masses are noted. No acute consolidative
airspace disease. No pleural effusions. Mild diffuse bronchial wall
thickening with mild centrilobular and moderate paraseptal
emphysema. Bilateral apical nodular pleuroparenchymal thickening and
architectural distortion, most compatible with areas of chronic post
infectious or inflammatory scarring.

Upper Abdomen: Aortic atherosclerosis. Liver has a shrunken
appearance and nodular contour, indicative of advanced cirrhosis.

Musculoskeletal: S-shaped scoliosis of the thoracic spine convex to
the left superiorly and to the right inferiorly. There are no
aggressive appearing lytic or blastic lesions noted in the
visualized portions of the skeleton.
IMPRESSION: 1. Lung-RADS 2S, benign appearance or behavior. Continue annual
screening with low-dose chest CT without contrast in 12 months.
2. The "S" modifier above refers to potentially clinically
significant non lung cancer related findings. Specifically, there is
aortic atherosclerosis, in addition to left main and three-vessel
coronary artery disease. Please note that although the presence of
coronary artery calcium documents the presence of coronary artery
disease, the severity of this disease and any potential stenosis
cannot be assessed on this non-gated CT examination. Assessment for
potential risk factor modification, dietary therapy or pharmacologic
therapy may be warranted, if clinically indicated.
3. Mild diffuse bronchial wall thickening with mild centrilobular
and moderate paraseptal emphysema; imaging findings suggestive of
underlying COPD.
4. Cirrhosis.

Aortic Atherosclerosis ([9S]-[9S]) and Emphysema ([9S]-[9S]).

## 2022-03-18 ENCOUNTER — Other Ambulatory Visit: Payer: Self-pay

## 2022-03-18 DIAGNOSIS — F17210 Nicotine dependence, cigarettes, uncomplicated: Secondary | ICD-10-CM

## 2022-03-18 DIAGNOSIS — Z87891 Personal history of nicotine dependence: Secondary | ICD-10-CM

## 2022-03-18 DIAGNOSIS — Z122 Encounter for screening for malignant neoplasm of respiratory organs: Secondary | ICD-10-CM

## 2022-03-23 ENCOUNTER — Other Ambulatory Visit: Payer: Self-pay | Admitting: Physician Assistant

## 2022-03-23 DIAGNOSIS — I10 Essential (primary) hypertension: Secondary | ICD-10-CM

## 2022-03-24 ENCOUNTER — Other Ambulatory Visit: Payer: Self-pay | Admitting: Physician Assistant

## 2022-03-24 DIAGNOSIS — I10 Essential (primary) hypertension: Secondary | ICD-10-CM

## 2022-03-25 ENCOUNTER — Telehealth: Payer: Self-pay | Admitting: Internal Medicine

## 2022-03-25 NOTE — Telephone Encounter (Signed)
Patient would like a call re: ultrasound results. He said he can see it on mychart but doesn't know what it means.

## 2022-03-25 NOTE — Telephone Encounter (Addendum)
Patient reports that his niece looked at [place] report that stated "advanced liver disease".  Patient advised that he saw MD after his last [place] on 02/08/22 and those results were discussed.  I looked at the report from 02/08/22 [place] and did not see "advanced liver disease" on the report.  Advised patient to have his niece check the [place] report to confirm date.  Maybe she was looking at a previous imaging stating "advanced liver disease".  Instructed patient to call back with any further questions.

## 2022-04-02 ENCOUNTER — Ambulatory Visit: Payer: Medicaid Other | Admitting: Cardiology

## 2022-04-02 ENCOUNTER — Encounter: Payer: Self-pay | Admitting: Cardiology

## 2022-04-02 VITALS — BP 98/60 | HR 97 | Ht 65.0 in | Wt 155.2 lb

## 2022-04-02 DIAGNOSIS — I480 Paroxysmal atrial fibrillation: Secondary | ICD-10-CM

## 2022-04-02 DIAGNOSIS — I6523 Occlusion and stenosis of bilateral carotid arteries: Secondary | ICD-10-CM | POA: Diagnosis not present

## 2022-04-02 DIAGNOSIS — I10 Essential (primary) hypertension: Secondary | ICD-10-CM | POA: Diagnosis not present

## 2022-04-02 MED ORDER — HYDROCHLOROTHIAZIDE 12.5 MG PO TABS: 12.5000 mg | ORAL_TABLET | Freq: Every day | ORAL | 3 refills | Status: DC

## 2022-04-02 NOTE — Patient Instructions (Signed)
Medication Instructions:   Your physician has recommended you make the following change in your medication:    DECREASE your Hydrochlorothiazide to 12.5 MG once a day.   *If you need a refill on your cardiac medications before your next appointment, please call your pharmacy*    Follow-Up: At [facility], you and your health needs are our priority.  As part of our continuing mission to provide you with exceptional heart care, we have created designated Provider Care Teams.  These Care Teams include your primary Cardiologist (physician) and Advanced Practice Providers (APPs -  Physician Assistants and Nurse Practitioners) who all work together to provide you with the care you need, when you need it.  We recommend signing up for the patient portal called "MyChart".  Sign up information is provided on this After Visit Summary.  MyChart is used to connect with patients for Virtual Visits (Telemedicine).  Patients are able to view lab/test results, encounter notes, upcoming appointments, etc.  Non-urgent messages can be sent to your provider as well.   To learn more about what you can do with MyChart, go to [email or web address].    Your next appointment:   3 month(s)  The format for your next appointment:   In Person  Provider:   You may see Debbe Odea, MD or one of the following Advanced Practice Providers on your designated Care Team:   Nicolasa Ducking, NP Eula Listen, PA-C Cadence Fransico Michael, [place]    Other Instructions   Important Information About Sugar

## 2022-04-02 NOTE — Progress Notes (Signed)
Cardiology Office Note:    Date:  04/02/2022   ID:  KEYONTAE Hoffman, DOB 1956/06/15, MRN [id number]  PCP:  Pcp, No  Cardiologist:  Kate Sable, MD  Electrophysiologist:  None   Referring MD: Mylinda Latina, PA*   No chief complaint on file.   History of Present Illness:    Gabriel Hoffman is a 66 y.o. male with a hx of hypertension, hyperlipidemia, atrial fibrillation status post ablation in September 2020, ?prior MI in (Feb 2020), current smoker who presents for follow-up.  Being seen for paroxysmal A-fib and hypertension.  Cardiac monitor was placed due to occasional/rare palpitations.  Monitor did reveal paroxysmal A-fib 1% burden.  He takes Toprol-XL for heart rate control, Xarelto for stroke prophylaxis.  Denies any bleeding issues.  States feeling tired sometimes.  Endorses knee and back pains.  Overall doing okay from a cardiac perspective.  Had a neck/carotid ultrasound showing 50% blockage bilaterally.   Prior notes Cardiac monitor 09/2021 showed paroxysmal A-fib, 1% burden Echocardiogram [id number] normal systolic function, EF 55 to 60%, impaired relaxation, normal LA size.  Patient states having a heart attack in February 2020 while he was incarcerated.  He was taken to [facility] in [facility].  He states being there for 1 week, no procedures were performed and he was placed on meds.  In September of he states being taken to [facility] where he was diagnosed with atrial fibrillation.  He had an ablation and was started on Xarelto.  He recently followed up with a primary care provider in the area to establish care.  His blood pressures were elevated and he was started on hydrochlorothiazide 12.5 mg daily.  He is a current smoker and is working on quitting.    Past Medical History:  Diagnosis Date   Anginal pain ([place])    Anxiety    Aortic atherosclerosis ([place])    Arthritis    Atrial fibrillation ([place])    a.) CHA2DS2-VASc Score =  2 (HTN, vascular disease/prior MI).  b.) Rate/rhythm maintained on metoprolol succinate; chronically anticoagulated with rivaroxaban.   CAD (coronary artery disease)    Carotid artery stenosis    Cirrhosis ([place]) [id number]   Complication of anesthesia    a.) seizure like activity with propofol during colonoscopy   COPD (chronic obstructive pulmonary disease) ([place])    Current use of long term anticoagulation    a.) Rivaroxaban   Diastolic dysfunction 0000000   a.) TTE 02/25/2020: normal LV function; EF 55-65%; GLS -15.8%; G1DD.   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 C282Y and S65C were negative.   Hepatitis C 06/15/2020   a.) Tx'd with Harvoni course   HLD (hyperlipidemia)    Hypertension    Hypothyroidism    Lung nodule    Myocardial infarction [facility]) 2019   x2 MI's no stents   Thrombocytopenia [facility])     Past Surgical History:  Procedure Laterality Date   CARDIAC CATHETERIZATION     CATARACT EXTRACTION Bilateral    COLONOSCOPY     FRACTURE SURGERY Left    pins rods left wrist   HARDWARE REMOVAL Right 04/30/2021   Procedure: HARDWARE REMOVAL, Right tibia IM nail removal;  Surgeon: Hessie Knows, MD;  Location: [facility] ORS;  Service: Orthopedics;  Laterality: Right;   HERNIA REPAIR Bilateral    LEG  SURGERY Right    rod in leg   TOTAL KNEE ARTHROPLASTY Right 10/02/2021   Procedure: TOTAL KNEE ARTHROPLASTY;  Surgeon: Hessie Knows, MD;  Location: [facility] ORS;  Service: Orthopedics;  Laterality: Right;   UPPER GI ENDOSCOPY      Current Medications: Current Meds  Medication Sig   atorvastatin (LIPITOR) 10 MG tablet TAKE 1 TABLET DAILY.   buPROPion (WELLBUTRIN XL) 150 MG 24 hr tablet Take 150 mg by mouth daily.   escitalopram (LEXAPRO) 10 MG tablet Take 1 tablet (10 mg total) by mouth daily.   Hypromell-Glycerin-Naphazoline (CLEAR EYES FOR DRY  EYES PLUS) 0.8-0.25-0.012 % SOLN Place 1-2 drops into both eyes 3 (three) times daily as needed (dry/irritated eyes).   levothyroxine (SYNTHROID) 125 MCG tablet TAKE 1 TABLET DAILY.   lisinopril (ZESTRIL) 10 MG tablet TAKE 1 TABLET DAILY.   metoprolol succinate (TOPROL-XL) 100 MG 24 hr tablet Take 100 mg by mouth daily.   nitroGLYCERIN (NITROSTAT) 0.4 MG SL tablet Place 0.4 mg under the tongue every 5 (five) minutes x 3 doses as needed for chest pain.   pantoprazole (PROTONIX) 20 MG tablet Take 1 tablet by mouth once daily   rivaroxaban (XARELTO) 20 MG TABS tablet Take 1 tablet (20 mg total) by mouth in the morning.   [DISCONTINUED] hydrochlorothiazide (HYDRODIURIL) 25 MG tablet TAKE 1 TABLET DAILY.     Allergies:   Codeine and Propofol   Social History   Socioeconomic History   Marital status: Single    Spouse name: Not on file   Number of children: Not on file   Years of education: Not on file   Highest education level: Not on file  Occupational History   Not on file  Tobacco Use   Smoking status: Every Day    Packs/day: 1.00    Years: 50.00    Pack years: 50.00    Types: Cigarettes   Smokeless tobacco: Never   Tobacco comments:    Smokes about 4 cigarettes daily  Vaping Use   Vaping Use: Never used  Substance and Sexual Activity   Alcohol use: Never    Comment: Quit   Drug use: Never   Sexual activity: Not on file  Other Topics Concern   Not on file  Social History Narrative   Alcohol/beer once/twice a month; 1/2 ppd; used to work in Architect. In [employer]; with sister.    Social Determinants of Health   Financial Resource Strain: Not on file  Food Insecurity: Not on file  Transportation Needs: Not on file  Physical Activity: Not on file  Stress: Not on file  Social Connections: Not on file     Family History: The patient's family history includes COPD in his mother; Cancer in his mother and sister.  ROS:   Please see the history of present illness.      All other systems reviewed and are negative.  EKGs/Labs/Other Studies Reviewed:    The following studies were reviewed today:   EKG:  EKG is  ordered today.  The ekg ordered today demonstrates normal sinus rhythm, PACs.  Recent Labs: 12/15/2021: TSH 0.833 02/17/2022: ALT 17; BUN 16; Creatinine, Ser 0.89; Hemoglobin 15.0; Platelets 116; Potassium 3.4; Sodium 132  Recent Lipid Panel    Component Value Date/Time   CHOL 102 12/15/2021 1532   TRIG 149 12/15/2021 1532   HDL 21 (L) 12/15/2021 1532   LDLCALC 55 12/15/2021 1532    Physical Exam:    VS:  BP 98/60   Pulse  97   Ht 5\' 5"  (1.651 m)   Wt 155 lb 3.2 oz (70.4 kg)   SpO2 96%   BMI 25.83 kg/m     Wt Readings from Last 3 Encounters:  04/02/22 155 lb 3.2 oz (70.4 kg)  03/15/22 160 lb (72.6 kg)  02/24/22 160 lb 6.4 oz (72.8 kg)     GEN:  Well nourished, well developed in no acute distress HEENT: Normal NECK: No JVD; No carotid bruits LYMPHATICS: No lymphadenopathy CARDIAC: RRR, no murmurs, rubs, gallops RESPIRATORY:  Clear to auscultation without rales ABDOMEN: Soft, non-tender, non-distended MUSCULOSKELETAL:  No edema; No deformity  SKIN: Warm and dry` NEUROLOGIC:  Alert and oriented x 3 PSYCHIATRIC:  Normal affect   ASSESSMENT:    1. PAF (paroxysmal atrial fibrillation) ([place])   2. Primary hypertension   3. Bilateral carotid artery stenosis      PLAN:    In order of problems listed above:  Paroxysmal atrial fibrillation s/p ablation September 2020 at [facility].  Occasional/rare palpitations, cardiac monitor showed rare episodes of atrial fibrillation about 1% burden.  Currently in sinus rhythm.  Continue Toprol-XL 100 mg qd, Xarelto.   Hypertension, BP controlled/low normal.  Continue Toprol-XL, lisinopril, reduce HCTZ to 12.5 mg daily. Bilateral carotid stenosis less than 50%.  Refer to vascular surgery.  Continue Xarelto, Lipitor.  LDL at goal.  Follow-up in 6 months.   This note was generated  in part or whole with voice recognition software. Voice recognition is usually quite accurate but there are transcription errors that can and very often do occur. I apologize for any typographical errors that were not detected and corrected.  Medication Adjustments/Labs and Tests Ordered: Current medicines are reviewed at length with the patient today.  Concerns regarding medicines are outlined above.  Orders Placed This Encounter  Procedures   Ambulatory referral to Vascular Surgery   EKG 12-Lead    Meds ordered this encounter  Medications   hydrochlorothiazide (HYDRODIURIL) 12.5 MG tablet    Sig: Take 1 tablet (12.5 mg total) by mouth daily.    Dispense:  30 tablet    Refill:  3     Patient Instructions  Medication Instructions:   Your physician has recommended you make the following change in your medication:    DECREASE your Hydrochlorothiazide to 12.5 MG once a day.   *If you need a refill on your cardiac medications before your next appointment, please call your pharmacy*    Follow-Up: At [facility], you and your health needs are our priority.  As part of our continuing mission to provide you with exceptional heart care, we have created designated Provider Care Teams.  These Care Teams include your primary Cardiologist (physician) and Advanced Practice Providers (APPs -  Physician Assistants and Nurse Practitioners) who all work together to provide you with the care you need, when you need it.  We recommend signing up for the patient portal called "MyChart".  Sign up information is provided on this After Visit Summary.  MyChart is used to connect with patients for Virtual Visits (Telemedicine).  Patients are able to view lab/test results, encounter notes, upcoming appointments, etc.  Non-urgent messages can be sent to your provider as well.   To learn more about what you can do with MyChart, go to [email or web address].    Your next appointment:   3 month(s)  The  format for your next appointment:   In Person  Provider:   You may see Kate Sable, MD or  one of the following Advanced Practice Providers on your designated Care Team:   Murray Hodgkins, NP Christell Faith, PA-C Cadence Kathlen Mody, [place]    Other Instructions   Important Information About Sugar         Signed, Kate Sable, MD  04/02/2022 3:03 PM    [place]

## 2022-04-12 ENCOUNTER — Other Ambulatory Visit: Payer: Self-pay | Admitting: Physician Assistant

## 2022-04-12 DIAGNOSIS — E039 Hypothyroidism, unspecified: Secondary | ICD-10-CM

## 2022-04-15 ENCOUNTER — Encounter: Payer: Self-pay | Admitting: Internal Medicine

## 2022-04-22 ENCOUNTER — Encounter (INDEPENDENT_AMBULATORY_CARE_PROVIDER_SITE_OTHER): Payer: Self-pay | Admitting: Nurse Practitioner

## 2022-04-22 ENCOUNTER — Ambulatory Visit (INDEPENDENT_AMBULATORY_CARE_PROVIDER_SITE_OTHER): Payer: Medicaid Other | Admitting: Nurse Practitioner

## 2022-04-22 VITALS — BP 118/71 | HR 70 | Resp 16 | Ht 65.5 in | Wt 157.2 lb

## 2022-04-22 DIAGNOSIS — E782 Mixed hyperlipidemia: Secondary | ICD-10-CM

## 2022-04-22 DIAGNOSIS — I10 Essential (primary) hypertension: Secondary | ICD-10-CM

## 2022-04-22 DIAGNOSIS — I6523 Occlusion and stenosis of bilateral carotid arteries: Secondary | ICD-10-CM | POA: Diagnosis not present

## 2022-05-04 ENCOUNTER — Encounter (INDEPENDENT_AMBULATORY_CARE_PROVIDER_SITE_OTHER): Payer: Self-pay | Admitting: Nurse Practitioner

## 2022-05-04 NOTE — Progress Notes (Signed)
Subjective:    Patient ID: Gabriel Hoffman, male    DOB: 02-26-1956, 66 y.o.   MRN: [id number] Chief Complaint  Patient presents with   New Patient (Initial Visit)    Ref [place]-etang bilateral carotid stenosis    The patient is seen for evaluation of carotid stenosis. The carotid stenosis was identified after carotid ultrasound.  It was approximately 150% bilaterally.  The patient denies amaurosis fugax. There is no recent history of TIA symptoms or focal motor deficits. There is no prior documented CVA.  He does endorse having some numbness and tingling in his hand, however the patient does have known back issues as well.  There is no history of migraine headaches. There is no history of seizures.  The patient is taking enteric-coated aspirin 81 mg daily.  No recent shortening of the patient's walking distance or new symptoms consistent with claudication.  No history of rest pain symptoms. No new ulcers or wounds of the lower extremities have occurred.  There is no history of DVT, PE or superficial thrombophlebitis. No recent episodes of angina or shortness of breath documented.       Review of Systems  Neurological:  Positive for numbness.  All other systems reviewed and are negative.      Objective:   Physical Exam Vitals reviewed.  HENT:     Head: Normocephalic.  Neck:     Vascular: No carotid bruit.  Cardiovascular:     Rate and Rhythm: Normal rate.     Pulses:          Radial pulses are 2+ on the right side and 2+ on the left side.  Pulmonary:     Effort: Pulmonary effort is normal.  Skin:    General: Skin is warm and dry.  Neurological:     Mental Status: He is alert and oriented to person, place, and time.  Psychiatric:        Mood and Affect: Mood normal.        Behavior: Behavior normal.        Thought Content: Thought content normal.        Judgment: Judgment normal.     BP 118/71 (BP Location: Right Arm)   Pulse 70   Resp 16   Ht 5' 5.5"  (1.664 m)   Wt 157 lb 3.2 oz (71.3 kg)   BMI 25.76 kg/m   Past Medical History:  Diagnosis Date   Anginal pain (HCC)    Anxiety    Aortic atherosclerosis (HCC)    Arthritis    Atrial fibrillation (HCC)    a.) CHA2DS2-VASc Score = 2 (HTN, vascular disease/prior MI).  b.) Rate/rhythm maintained on metoprolol succinate; chronically anticoagulated with rivaroxaban.   CAD (coronary artery disease)    Carotid artery stenosis    Cirrhosis (HCC) 06/15/2020   Complication of anesthesia    a.) seizure like activity with propofol during colonoscopy   COPD (chronic obstructive pulmonary disease) (HCC)    Current use of long term anticoagulation    a.) Rivaroxaban   Diastolic dysfunction 02/25/2020   a.) TTE 02/25/2020: normal LV function; EF 55-65%; GLS -15.8%; G1DD.   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 C282Y and S65C were negative.   Hepatitis C 06/15/2020   a.) Tx'd with Harvoni course   HLD (hyperlipidemia)    Hypertension  Hypothyroidism    Lung nodule    Myocardial infarction [facility]) 2019   x2 MI's no stents   Thrombocytopenia (HCC)     Social History   Socioeconomic History   Marital status: Single    Spouse name: Not on file   Number of children: Not on file   Years of education: Not on file   Highest education level: Not on file  Occupational History   Not on file  Tobacco Use   Smoking status: Every Day    Packs/day: 1.00    Years: 50.00    Total pack years: 50.00    Types: Cigarettes   Smokeless tobacco: Never   Tobacco comments:    Smokes about 4 cigarettes daily  Vaping Use   Vaping Use: Never used  Substance and Sexual Activity   Alcohol use: Never    Comment: Quit   Drug use: Never   Sexual activity: Not on file  Other Topics Concern   Not on file  Social History Narrative   Alcohol/beer once/twice a month; 1/2  ppd; used to work in [employer] representative. In [place]; with sister.    Social Determinants of Health   Financial Resource Strain: Not on file  Food Insecurity: Not on file  Transportation Needs: Not on file  Physical Activity: Not on file  Stress: Not on file  Social Connections: Not on file  Intimate Partner Violence: Not on file    Past Surgical History:  Procedure Laterality Date   CARDIAC CATHETERIZATION     CATARACT EXTRACTION Bilateral    COLONOSCOPY     FRACTURE SURGERY Left    pins rods left wrist   HARDWARE REMOVAL Right 04/30/2021   Procedure: HARDWARE REMOVAL, Right tibia IM nail removal;  Surgeon: Kennedy Bucker, MD;  Location: [facility] ORS;  Service: Orthopedics;  Laterality: Right;   HERNIA REPAIR Bilateral    LEG SURGERY Right    rod in leg   TOTAL KNEE ARTHROPLASTY Right 10/02/2021   Procedure: TOTAL KNEE ARTHROPLASTY;  Surgeon: Kennedy Bucker, MD;  Location: [facility] ORS;  Service: Orthopedics;  Laterality: Right;   UPPER GI ENDOSCOPY      Family History  Problem Relation Age of Onset   Cancer Mother    COPD Mother    Cancer Sister     Allergies  Allergen Reactions   Codeine Nausea And Vomiting    With fever   Propofol Other (See Comments)    Seizure like activity during colonoscopy.  Tolerated propofol infusion with no reaction at all 10/02/21.       Latest Ref Rng & Units 02/17/2022   10:52 AM 12/15/2021    3:32 PM 10/03/2021    4:48 AM  CBC  WBC 4.0 - 10.5 K/uL 10.5  12.5  12.4   Hemoglobin 13.0 - 17.0 g/dL 99.2  42.6  83.4   Hematocrit 39.0 - 52.0 % 42.9  42.9  37.4   Platelets 150 - 400 K/uL 116  161  106       CMP     Component Value Date/Time   NA 132 (L) 02/17/2022 1052   NA 135 12/15/2021 1532   NA 143 04/24/2012 1746   K 3.4 (L) 02/17/2022 1052   K 3.6 04/24/2012 1746   CL 102 02/17/2022 1052   CL 109 (H) 04/24/2012 1746   CO2 24 02/17/2022 1052   CO2 23 04/24/2012 1746   GLUCOSE 115 (H) 02/17/2022 1052   GLUCOSE 80 04/24/2012 1746    BUN 16  02/17/2022 1052   BUN 21 12/15/2021 1532   BUN 7 04/24/2012 1746   CREATININE 0.89 02/17/2022 1052   CREATININE 0.87 04/24/2012 1746   CALCIUM 9.2 02/17/2022 1052   CALCIUM 8.9 04/24/2012 1746   PROT 7.7 02/17/2022 1052   PROT 7.5 12/15/2021 1532   PROT 8.0 04/24/2012 1746   ALBUMIN 3.7 02/17/2022 1052   ALBUMIN 4.0 12/15/2021 1532   ALBUMIN 3.7 04/24/2012 1746   AST 22 02/17/2022 1052   AST 133 (H) 04/24/2012 1746   ALT 17 02/17/2022 1052   ALT 137 (H) 04/24/2012 1746   ALKPHOS 70 02/17/2022 1052   ALKPHOS 65 04/24/2012 1746   BILITOT 1.1 02/17/2022 1052   BILITOT 0.7 12/15/2021 1532   BILITOT 0.6 04/24/2012 1746   GFRNONAA >60 02/17/2022 1052   GFRNONAA >60 04/24/2012 1746   GFRAA >60 07/25/2020 1216   GFRAA >60 04/24/2012 1746     No results found.     Assessment & Plan:   1. Bilateral carotid artery stenosis Recommend:  Given the patient's asymptomatic subcritical stenosis no further invasive testing or surgery at this time.  Duplex ultrasound shows <50% stenosis bilaterally.  Continue antiplatelet therapy as prescribed Continue management of CAD, HTN and Hyperlipidemia Healthy heart diet,  encouraged exercise at least 4 times per week Follow up in 6 months with duplex ultrasound and physical exam    2. Essential hypertension Continue antihypertensive medications as already ordered, these medications have been reviewed and there are no changes at this time.   3. Mixed hyperlipidemia Continue statin as ordered and reviewed, no changes at this time    Current Outpatient Medications on File Prior to Visit  Medication Sig Dispense Refill   atorvastatin (LIPITOR) 10 MG tablet TAKE 1 TABLET DAILY. 90 tablet 1   buPROPion (WELLBUTRIN XL) 150 MG 24 hr tablet Take 150 mg by mouth daily.     DULoxetine (CYMBALTA) 30 MG capsule Take 30 mg by mouth daily.     escitalopram (LEXAPRO) 10 MG tablet Take 1 tablet (10 mg total) by mouth daily. 90 tablet 1    hydrochlorothiazide (HYDRODIURIL) 12.5 MG tablet Take 1 tablet (12.5 mg total) by mouth daily. 30 tablet 3   hydrOXYzine (ATARAX) 25 MG tablet Take 25 mg by mouth daily.     Hypromell-Glycerin-Naphazoline (CLEAR EYES FOR DRY EYES PLUS) 0.8-0.25-0.012 % SOLN Place 1-2 drops into both eyes 3 (three) times daily as needed (dry/irritated eyes).     levothyroxine (SYNTHROID) 125 MCG tablet TAKE 1 TABLET DAILY. 90 tablet 0   lisinopril (ZESTRIL) 10 MG tablet TAKE 1 TABLET DAILY. 90 tablet 1   metoprolol succinate (TOPROL-XL) 100 MG 24 hr tablet Take 100 mg by mouth daily.     nitroGLYCERIN (NITROSTAT) 0.4 MG SL tablet Place 0.4 mg under the tongue every 5 (five) minutes x 3 doses as needed for chest pain.     oxyCODONE-acetaminophen (PERCOCET/ROXICET) 5-325 MG tablet Take 1 tablet by mouth daily as needed.     pantoprazole (PROTONIX) 20 MG tablet Take 1 tablet by mouth once daily 90 tablet 1   rivaroxaban (XARELTO) 20 MG TABS tablet Take 1 tablet (20 mg total) by mouth in the morning.     No current facility-administered medications on file prior to visit.    There are no Patient Instructions on file for this visit. No follow-ups on file.   Georgiana Spinner, NP

## 2022-05-13 ENCOUNTER — Other Ambulatory Visit: Payer: Self-pay | Admitting: Physician Assistant

## 2022-05-13 DIAGNOSIS — E039 Hypothyroidism, unspecified: Secondary | ICD-10-CM

## 2022-05-14 ENCOUNTER — Other Ambulatory Visit: Payer: Self-pay | Admitting: Physician Assistant

## 2022-05-14 DIAGNOSIS — E039 Hypothyroidism, unspecified: Secondary | ICD-10-CM

## 2022-07-09 ENCOUNTER — Ambulatory Visit: Payer: Medicaid Other | Attending: Cardiology | Admitting: Cardiology

## 2022-07-09 ENCOUNTER — Encounter: Payer: Self-pay | Admitting: Cardiology

## 2022-07-09 VITALS — BP 110/60 | HR 94 | Ht 65.0 in | Wt 161.0 lb

## 2022-07-09 DIAGNOSIS — R072 Precordial pain: Secondary | ICD-10-CM | POA: Diagnosis not present

## 2022-07-09 DIAGNOSIS — F17200 Nicotine dependence, unspecified, uncomplicated: Secondary | ICD-10-CM

## 2022-07-09 DIAGNOSIS — I10 Essential (primary) hypertension: Secondary | ICD-10-CM

## 2022-07-09 DIAGNOSIS — I480 Paroxysmal atrial fibrillation: Secondary | ICD-10-CM

## 2022-07-09 DIAGNOSIS — I2510 Atherosclerotic heart disease of native coronary artery without angina pectoris: Secondary | ICD-10-CM | POA: Diagnosis not present

## 2022-07-09 NOTE — Progress Notes (Signed)
Cardiology Office Note:    Date:  07/09/2022   ID:  Gabriel Hoffman, DOB [birth year], MRN [id number]  PCP:  Pcp, No  Cardiologist:  Kate Sable, MD  Electrophysiologist:  None   Referring MD: No ref. provider found   Chief Complaint  Patient presents with   Follow-up    Patient reports feeling intermittent L-sided chest pain and pressure.     History of Present Illness:    Gabriel Hoffman is a 66 y.o. male with a hx of hypertension, hyperlipidemia, atrial fibrillation status post ablation in September 2020, ?prior MI in (Feb 2020), current smoker x50+ years who presents for follow-up.  Chest CT lung cancer screening 03/15/2022 showed three-vessel coronary calcification.  States having on and off chest pain over the past several weeks.  Chest pain is different from his typical reflux symptoms.  He still smokes.  Takes medications as prescribed.  Denies any bleeding issues with Xarelto.  Prior notes Cardiac monitor 09/2021 showed paroxysmal A-fib, 1% burden Echocardiogram 99991111 normal systolic function, EF 55 to 60%, impaired relaxation, normal LA size.  Patient states having a heart attack in February 2020 while he was incarcerated.  He was taken to [facility] in [facility].  He states being there for 1 week, no procedures were performed and he was placed on meds.  In September of he states being taken to [facility] where he was diagnosed with atrial fibrillation.  He had an ablation and was started on Xarelto.  He recently followed up with a primary care provider in the area to establish care.  His blood pressures were elevated and he was started on hydrochlorothiazide 12.5 mg daily.  He is a current smoker and is working on quitting.    Past Medical History:  Diagnosis Date   Anginal pain ([place])    Anxiety    Aortic atherosclerosis ([place])    Arthritis    Atrial fibrillation ([place])    a.) CHA2DS2-VASc Score = 2 (HTN, vascular disease/prior  MI).  b.) Rate/rhythm maintained on metoprolol succinate; chronically anticoagulated with rivaroxaban.   CAD (coronary artery disease)    Carotid artery stenosis    Cirrhosis ([place]) [id number]   Complication of anesthesia    a.) seizure like activity with propofol during colonoscopy   COPD (chronic obstructive pulmonary disease) ([place])    Current use of long term anticoagulation    a.) Rivaroxaban   Diastolic dysfunction 0000000   a.) TTE 02/25/2020: normal LV function; EF 55-65%; GLS -15.8%; G1DD.   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 C282Y and S65C were negative.   Hepatitis C 06/15/2020   a.) Tx'd with Harvoni course   HLD (hyperlipidemia)    Hypertension    Hypothyroidism    Lung nodule    Myocardial infarction [facility]) 2019   x2 MI's no stents   Thrombocytopenia [facility])     Past Surgical History:  Procedure Laterality Date   CARDIAC CATHETERIZATION     CATARACT EXTRACTION Bilateral    COLONOSCOPY     FRACTURE SURGERY Left    pins rods left wrist   HARDWARE REMOVAL Right 04/30/2021   Procedure: HARDWARE REMOVAL, Right tibia IM nail removal;  Surgeon: Hessie Knows, MD;  Location: [facility] ORS;  Service: Orthopedics;  Laterality: Right;   HERNIA REPAIR Bilateral    LEG SURGERY  Right    rod in leg   TOTAL KNEE ARTHROPLASTY Right 10/02/2021   Procedure: TOTAL KNEE ARTHROPLASTY;  Surgeon: Kennedy Bucker, MD;  Location: [facility] ORS;  Service: Orthopedics;  Laterality: Right;   UPPER GI ENDOSCOPY      Current Medications: Current Meds  Medication Sig   ALPRAZolam (XANAX) 0.5 MG tablet Take 1 tablet by mouth as needed.   atorvastatin (LIPITOR) 10 MG tablet TAKE 1 TABLET DAILY.   DULoxetine (CYMBALTA) 30 MG capsule Take 30 mg by mouth daily.   escitalopram (LEXAPRO) 10 MG tablet Take 1 tablet (10 mg total) by mouth daily.   hydrOXYzine (ATARAX) 25  MG tablet Take 25 mg by mouth daily.   Hypromell-Glycerin-Naphazoline (CLEAR EYES FOR DRY EYES PLUS) 0.8-0.25-0.012 % SOLN Place 1-2 drops into both eyes 3 (three) times daily as needed (dry/irritated eyes).   levothyroxine (SYNTHROID) 125 MCG tablet TAKE 1 TABLET DAILY.   lisinopril (ZESTRIL) 10 MG tablet TAKE 1 TABLET DAILY.   metoprolol succinate (TOPROL-XL) 100 MG 24 hr tablet Take 100 mg by mouth daily.   nitroGLYCERIN (NITROSTAT) 0.4 MG SL tablet Place 0.4 mg under the tongue every 5 (five) minutes x 3 doses as needed for chest pain.   oxyCODONE-acetaminophen (PERCOCET/ROXICET) 5-325 MG tablet Take 1 tablet by mouth daily as needed.   pantoprazole (PROTONIX) 20 MG tablet Take 1 tablet by mouth once daily   rivaroxaban (XARELTO) 20 MG TABS tablet Take 1 tablet (20 mg total) by mouth in the morning.   [DISCONTINUED] hydrochlorothiazide (HYDRODIURIL) 12.5 MG tablet Take 1 tablet (12.5 mg total) by mouth daily.     Allergies:   Codeine and Propofol   Social History   Socioeconomic History   Marital status: Single    Spouse name: Not on file   Number of children: Not on file   Years of education: Not on file   Highest education level: Not on file  Occupational History   Not on file  Tobacco Use   Smoking status: Every Day    Packs/day: 1.00    Years: 50.00    Total pack years: 50.00    Types: Cigarettes   Smokeless tobacco: Never   Tobacco comments:    Smokes about 4 cigarettes daily  Vaping Use   Vaping Use: Never used  Substance and Sexual Activity   Alcohol use: Never    Comment: Quit   Drug use: Never   Sexual activity: Not on file  Other Topics Concern   Not on file  Social History Narrative   Alcohol/beer once/twice a month; 1/2 ppd; used to work in [employer]. In [place]; with sister.    Social Determinants of Health   Financial Resource Strain: Not on file  Food Insecurity: Not on file  Transportation Needs: Not on file  Physical Activity: Not on file   Stress: Not on file  Social Connections: Not on file     Family History: The patient's family history includes COPD in his mother; Cancer in his mother and sister.  ROS:   Please see the history of present illness.     All other systems reviewed and are negative.  EKGs/Labs/Other Studies Reviewed:    The following studies were reviewed today:   EKG:  EKG is  ordered today.  The ekg ordered today demonstrates normal sinus rhythm, normal ECG  Recent Labs: 12/15/2021: TSH 0.833 02/17/2022: ALT 17; BUN 16; Creatinine, Ser 0.89; Hemoglobin 15.0; Platelets 116; Potassium 3.4; Sodium 132  Recent Lipid Panel  Component Value Date/Time   CHOL 102 12/15/2021 1532   TRIG 149 12/15/2021 1532   HDL 21 (L) 12/15/2021 1532   LDLCALC 55 12/15/2021 1532    Physical Exam:    VS:  BP 110/60 (BP Location: Left Arm, Patient Position: Sitting, Cuff Size: Normal)   Pulse 94   Ht 5\' 5"  (1.651 m)   Wt 161 lb (73 kg)   SpO2 97%   BMI 26.79 kg/m     Wt Readings from Last 3 Encounters:  07/09/22 161 lb (73 kg)  04/22/22 157 lb 3.2 oz (71.3 kg)  04/02/22 155 lb 3.2 oz (70.4 kg)     GEN:  Well nourished, well developed in no acute distress HEENT: Normal NECK: No JVD; No carotid bruits CARDIAC: RRR, no murmurs, rubs, gallops RESPIRATORY:  Clear to auscultation without rales ABDOMEN: Soft, non-tender, non-distended MUSCULOSKELETAL:  No edema; No deformity  SKIN: Warm and dry` NEUROLOGIC:  Alert and oriented x 3 PSYCHIATRIC:  Normal affect   ASSESSMENT:    1. Coronary artery disease involving native coronary artery of native heart, unspecified whether angina present   2. PAF (paroxysmal atrial fibrillation) (HCC)   3. Primary hypertension   4. Smoking   5. Precordial pain     PLAN:    In order of problems listed above:  CAD, three-vessel calcification on chest CT, has chest pain.  Continue Xarelto, Lipitor.  LDL at goal.  Get Lexiscan Myoview to evaluate ischemia.  Last EF 55  to 60% Paroxysmal atrial fibrillation s/p ablation September 2020 at [facility]. Currently in sinus rhythm.  Continue Toprol-XL 100 mg qd, Xarelto.   Hypertension, BP controlled/low normal.  Stop HCTZ, continue Toprol-XL, lisinopril,  Bilateral carotid stenosis less than 50%.  Refer to vascular surgery.  Continue Xarelto, Lipitor.  LDL at goal. Current smoker, smoking cessation advised.  Follow-up in 6 weeks  Shared Decision Making/Informed Consent The risks [chest pain, shortness of breath, cardiac arrhythmias, dizziness, blood pressure fluctuations, myocardial infarction, stroke/transient ischemic attack, nausea, vomiting, allergic reaction, radiation exposure, metallic taste sensation and life-threatening complications (estimated to be 1 in 10,000)], benefits (risk stratification, diagnosing coronary artery disease, treatment guidance) and alternatives of a nuclear stress test were discussed in detail with Gabriel Hoffman and he agrees to proceed.   Medication Adjustments/Labs and Tests Ordered: Current medicines are reviewed at length with the patient today.  Concerns regarding medicines are outlined above.  Orders Placed This Encounter  Procedures   NM Myocar Multi W/Spect W/Wall Motion / EF   EKG 12-Lead    No orders of the defined types were placed in this encounter.    Patient Instructions  Medication Instructions:   Your physician has recommended you make the following change in your medication:    STOP taking your Hydrochlorothiazide.  *If you need a refill on your cardiac medications before your next appointment, please call your pharmacy*    Testing/Procedures:  [facility] MYOVIEW     Your caregiver has ordered a Stress Test with nuclear imaging. The purpose of this test is to evaluate the blood supply to your heart muscle. This procedure is referred to as a "Non-Invasive Stress Test." This is because other than having an IV started in your vein, nothing is inserted or  "invades" your body. Cardiac stress tests are done to find areas of poor blood flow to the heart by determining the extent of coronary artery disease (CAD). Some patients exercise on a treadmill, which naturally increases the blood flow  to your heart, while others who are  unable to walk on a treadmill due to physical limitations have a pharmacologic/chemical stress agent called Lexiscan . This medicine will mimic walking on a treadmill by temporarily increasing your coronary blood flow.      PLEASE REPORT TO [facility] MEDICAL MALL ENTRANCE   THE VOLUNTEERS AT THE FIRST DESK WILL DIRECT YOU WHERE TO GO     *Please note: these test may take anywhere between 2-4 hours to complete       Date of Procedure:_____________________________________   Arrival Time for Procedure:______________________________    PLEASE NOTIFY THE OFFICE AT LEAST 24 HOURS IN ADVANCE IF YOU ARE UNABLE TO KEEP YOUR APPOINTMENT.  [place] 24 HOURS IN ADVANCE IF YOU ARE UNABLE TO KEEP YOUR APPOINTMENT. [phone]       How to prepare for your Myoview test:    1. Do not eat or drink after midnight  2. No caffeine for 24 hours prior to test  3. No smoking 24 hours prior to test.  4. Unless instructed otherwise, Take your medication with a small sips of water.    5.         Ladies, please do not wear dresses. Skirts or pants are appropriate. Please wear a short sleeve shirt.  6. No perfume, cologne or lotion.  7. Wear comfortable walking shoes. No heels!     Follow-Up: At [facility], you and your health needs are our priority.  As part of our continuing mission to provide you with exceptional heart care, we have created designated Provider Care Teams.  These Care Teams include your primary Cardiologist (physician) and Advanced Practice Providers (APPs -  Physician Assistants and Nurse Practitioners) who all work together to provide you with the care you  need, when you need it.  We recommend signing up for the patient portal called "MyChart".  Sign up information is provided on this After Visit Summary.  MyChart is used to connect with patients for Virtual Visits (Telemedicine).  Patients are able to view lab/test results, encounter notes, upcoming appointments, etc.  Non-urgent messages can be sent to your provider as well.   To learn more about what you can do with MyChart, go to [email or web address].    Your next appointment:   Follow up after testing   The format for your next appointment:   In Person  Provider:   You may see Kate Sable, MD or one of the following Advanced Practice Providers on your designated Care Team:   Murray Hodgkins, NP Christell Faith, PA-C Cadence Kathlen Mody, PA-C Gerrie Nordmann, NP     Important Information About Sugar         Signed, Kate Sable, MD  07/09/2022 3:41 PM    [place]

## 2022-07-09 NOTE — Patient Instructions (Signed)
Medication Instructions:   Your physician has recommended you make the following change in your medication:    STOP taking your Hydrochlorothiazide.  *If you need a refill on your cardiac medications before your next appointment, please call your pharmacy*    Testing/Procedures:  [facility] MYOVIEW     Your caregiver has ordered a Stress Test with nuclear imaging. The purpose of this test is to evaluate the blood supply to your heart muscle. This procedure is referred to as a "Non-Invasive Stress Test." This is because other than having an IV started in your vein, nothing is inserted or "invades" your body. Cardiac stress tests are done to find areas of poor blood flow to the heart by determining the extent of coronary artery disease (CAD). Some patients exercise on a treadmill, which naturally increases the blood flow to your heart, while others who are  unable to walk on a treadmill due to physical limitations have a pharmacologic/chemical stress agent called Lexiscan . This medicine will mimic walking on a treadmill by temporarily increasing your coronary blood flow.      PLEASE REPORT TO [facility] MEDICAL MALL ENTRANCE   THE VOLUNTEERS AT THE FIRST DESK WILL DIRECT YOU WHERE TO GO     *Please note: these test may take anywhere between 2-4 hours to complete       Date of Procedure:_____________________________________   Arrival Time for Procedure:______________________________    PLEASE NOTIFY THE OFFICE AT LEAST 24 HOURS IN ADVANCE IF YOU ARE UNABLE TO KEEP YOUR APPOINTMENT.  [phone]  PLEASE NOTIFY NUCLEAR MEDICINE AT [facility] AT LEAST 24 HOURS IN ADVANCE IF YOU ARE UNABLE TO KEEP YOUR APPOINTMENT. [phone]       How to prepare for your Myoview test:    1. Do not eat or drink after midnight  2. No caffeine for 24 hours prior to test  3. No smoking 24 hours prior to test.  4. Unless instructed otherwise, Take your medication with a small sips of water.    5.         Ladies,  please do not wear dresses. Skirts or pants are appropriate. Please wear a short sleeve shirt.  6. No perfume, cologne or lotion.  7. Wear comfortable walking shoes. No heels!     Follow-Up: At [facility], you and your health needs are our priority.  As part of our continuing mission to provide you with exceptional heart care, we have created designated Provider Care Teams.  These Care Teams include your primary Cardiologist (physician) and Advanced Practice Providers (APPs -  Physician Assistants and Nurse Practitioners) who all work together to provide you with the care you need, when you need it.  We recommend signing up for the patient portal called "MyChart".  Sign up information is provided on this After Visit Summary.  MyChart is used to connect with patients for Virtual Visits (Telemedicine).  Patients are able to view lab/test results, encounter notes, upcoming appointments, etc.  Non-urgent messages can be sent to your provider as well.   To learn more about what you can do with MyChart, go to [email or web address].    Your next appointment:   Follow up after testing   The format for your next appointment:   In Person  Provider:   You may see Debbe Odea, MD or one of the following Advanced Practice Providers on your designated Care Team:   Nicolasa Ducking, NP Eula Listen, PA-C Cadence Fransico Michael, PA-C Charlsie Quest, NP  Important Information About Sugar

## 2022-07-14 ENCOUNTER — Encounter
Admission: RE | Admit: 2022-07-14 | Discharge: 2022-07-14 | Disposition: A | Discharge status: Home / Self Care | Payer: Medicaid Other | Source: Ambulatory Visit | Attending: Cardiology | Admitting: Cardiology

## 2022-07-14 ENCOUNTER — Other Ambulatory Visit: Payer: Self-pay

## 2022-07-14 DIAGNOSIS — R072 Precordial pain: Secondary | ICD-10-CM | POA: Diagnosis present

## 2022-07-14 LAB — NM MYOCAR MULTI W/SPECT W/WALL MOTION / EF
Base ST Depression (mm): 0 mm
LV dias vol: 84 mL (ref 62–150)
LV sys vol: 32 mL
Nuc Stress EF: 62 %
Peak HR: 88 {beats}/min
Percent HR: 56 %
Rest HR: 73 {beats}/min
Rest Nuclear Isotope Dose: 10.5 mCi
SDS: 0
SRS: 7
SSS: 0
ST Depression (mm): 0 mm
Stress Nuclear Isotope Dose: 32.4 mCi
TID: 1

## 2022-07-14 MED ORDER — TECHNETIUM TC 99M TETROFOSMIN IV KIT
10.5200 | PACK | Freq: Once | INTRAVENOUS | Status: AC | PRN
  Administered 2022-07-14: 10.52 via INTRAVENOUS

## 2022-07-14 MED ORDER — RIVAROXABAN 20 MG PO TABS: 20.0000 mg | ORAL_TABLET | Freq: Every morning | ORAL | 6 refills | Status: DC

## 2022-07-14 MED ORDER — TECHNETIUM TC 99M TETROFOSMIN IV KIT
32.4400 | PACK | Freq: Once | INTRAVENOUS | Status: AC | PRN
  Administered 2022-07-14: 32.44 via INTRAVENOUS

## 2022-07-14 MED ORDER — REGADENOSON 0.4 MG/5ML IV SOLN
0.4000 mg | Freq: Once | INTRAVENOUS | Status: AC
  Administered 2022-07-14: 0.4 mg via INTRAVENOUS
  Filled 2022-07-14: qty 5

## 2022-07-14 NOTE — Progress Notes (Signed)
Patient had a Myoview today and told Charlsie Quest that he has been out of his Xarelto for 6 months. Prescription has been sent in. Sent patient a MyChart message informing him of this.

## 2022-07-22 ENCOUNTER — Other Ambulatory Visit: Payer: Self-pay | Admitting: Physician Assistant

## 2022-07-22 DIAGNOSIS — E782 Mixed hyperlipidemia: Secondary | ICD-10-CM

## 2022-08-10 ENCOUNTER — Other Ambulatory Visit (HOSPITAL_COMMUNITY): Payer: Self-pay | Admitting: Anesthesiology

## 2022-08-10 ENCOUNTER — Other Ambulatory Visit: Payer: Self-pay | Admitting: Anesthesiology

## 2022-08-10 DIAGNOSIS — M5116 Intervertebral disc disorders with radiculopathy, lumbar region: Secondary | ICD-10-CM

## 2022-08-19 ENCOUNTER — Other Ambulatory Visit: Payer: Self-pay | Admitting: *Deleted

## 2022-08-20 ENCOUNTER — Ambulatory Visit: Payer: Medicaid Other

## 2022-08-20 ENCOUNTER — Inpatient Hospital Stay: Payer: Medicaid Other | Attending: Internal Medicine

## 2022-08-20 DIAGNOSIS — Z79899 Other long term (current) drug therapy: Secondary | ICD-10-CM | POA: Diagnosis not present

## 2022-08-20 DIAGNOSIS — F17210 Nicotine dependence, cigarettes, uncomplicated: Secondary | ICD-10-CM | POA: Diagnosis not present

## 2022-08-20 DIAGNOSIS — D731 Hypersplenism: Secondary | ICD-10-CM | POA: Diagnosis not present

## 2022-08-20 DIAGNOSIS — Z7901 Long term (current) use of anticoagulants: Secondary | ICD-10-CM | POA: Diagnosis not present

## 2022-08-20 DIAGNOSIS — D696 Thrombocytopenia, unspecified: Secondary | ICD-10-CM | POA: Insufficient documentation

## 2022-08-20 LAB — COMPREHENSIVE METABOLIC PANEL
ALT: 18 U/L (ref 0–44)
AST: 23 U/L (ref 15–41)
Albumin: 3.7 g/dL (ref 3.5–5.0)
Alkaline Phosphatase: 66 U/L (ref 38–126)
Anion gap: 6 (ref 5–15)
BUN: 19 mg/dL (ref 8–23)
CO2: 25 mmol/L (ref 22–32)
Calcium: 9.1 mg/dL (ref 8.9–10.3)
Chloride: 107 mmol/L (ref 98–111)
Creatinine, Ser: 1.07 mg/dL (ref 0.61–1.24)
GFR, Estimated: >60 mL/min (ref >60)
Glucose, Bld: 170 mg/dL — ABNORMAL HIGH (ref 70–99)
Potassium: 3.8 mmol/L (ref 3.5–5.1)
Sodium: 138 mmol/L (ref 135–145)
Total Bilirubin: 0.9 mg/dL (ref 0.3–1.2)
Total Protein: 7.9 g/dL (ref 6.5–8.1)

## 2022-08-20 LAB — CBC WITH DIFFERENTIAL/PLATELET
Abs Immature Granulocytes: 0.02 10*3/uL (ref 0.00–0.07)
Basophils Absolute: 0.1 10*3/uL (ref 0.0–0.1)
Basophils Relative: 1 %
Eosinophils Absolute: 0.4 10*3/uL (ref 0.0–0.5)
Eosinophils Relative: 6 %
HCT: 43 % (ref 39.0–52.0)
Hemoglobin: 14.7 g/dL (ref 13.0–17.0)
Immature Granulocytes: 0 %
Lymphocytes Relative: 25 %
Lymphs Abs: 1.7 10*3/uL (ref 0.7–4.0)
MCH: 32.2 pg (ref 26.0–34.0)
MCHC: 34.2 g/dL (ref 30.0–36.0)
MCV: 94.3 fL (ref 80.0–100.0)
Monocytes Absolute: 0.6 10*3/uL (ref 0.1–1.0)
Monocytes Relative: 9 %
Neutro Abs: 4.1 10*3/uL (ref 1.7–7.7)
Neutrophils Relative %: 59 %
Platelets: 83 10*3/uL — ABNORMAL LOW (ref 150–400)
RBC: 4.56 MIL/uL (ref 4.22–5.81)
RDW: 12.9 % (ref 11.5–15.5)
WBC: 6.7 10*3/uL (ref 4.0–10.5)
nRBC: 0 % (ref 0.0–0.2)

## 2022-08-20 LAB — FERRITIN: Ferritin: 27 ng/mL (ref 24–336)

## 2022-08-20 LAB — IRON AND TIBC
Iron: 75 ug/dL (ref 45–182)
Saturation Ratios: 18 % (ref 17.9–39.5)
TIBC: 414 ug/dL (ref 250–450)
UIBC: 339 ug/dL

## 2022-08-25 ENCOUNTER — Other Ambulatory Visit: Payer: Self-pay | Admitting: Internal Medicine

## 2022-08-25 DIAGNOSIS — K7460 Unspecified cirrhosis of liver: Secondary | ICD-10-CM

## 2022-08-25 DIAGNOSIS — R7989 Other specified abnormal findings of blood chemistry: Secondary | ICD-10-CM

## 2022-08-25 DIAGNOSIS — R1011 Right upper quadrant pain: Secondary | ICD-10-CM

## 2022-08-26 ENCOUNTER — Ambulatory Visit
Admission: RE | Admit: 2022-08-26 | Discharge: 2022-08-26 | Disposition: A | Discharge status: Home / Self Care | Payer: Medicaid Other | Source: Ambulatory Visit | Attending: Internal Medicine | Admitting: Internal Medicine

## 2022-08-26 ENCOUNTER — Ambulatory Visit: Payer: Medicaid Other | Attending: Cardiology | Admitting: Cardiology

## 2022-08-26 ENCOUNTER — Ambulatory Visit: Admission: RE | Admit: 2022-08-26 | Payer: Medicaid Other | Source: Ambulatory Visit

## 2022-08-26 DIAGNOSIS — R1011 Right upper quadrant pain: Secondary | ICD-10-CM | POA: Diagnosis present

## 2022-08-26 DIAGNOSIS — K7460 Unspecified cirrhosis of liver: Secondary | ICD-10-CM | POA: Diagnosis present

## 2022-08-26 DIAGNOSIS — R7989 Other specified abnormal findings of blood chemistry: Secondary | ICD-10-CM | POA: Diagnosis present

## 2022-08-27 ENCOUNTER — Encounter: Payer: Self-pay | Admitting: Cardiology

## 2022-08-27 ENCOUNTER — Encounter: Payer: Self-pay | Admitting: Internal Medicine

## 2022-08-27 ENCOUNTER — Inpatient Hospital Stay: Payer: Medicaid Other

## 2022-08-27 ENCOUNTER — Telehealth: Payer: Self-pay | Admitting: Internal Medicine

## 2022-08-27 ENCOUNTER — Other Ambulatory Visit: Payer: Medicaid Other

## 2022-08-27 ENCOUNTER — Other Ambulatory Visit: Payer: Self-pay

## 2022-08-27 ENCOUNTER — Inpatient Hospital Stay (HOSPITAL_BASED_OUTPATIENT_CLINIC_OR_DEPARTMENT_OTHER): Payer: Medicaid Other | Admitting: Internal Medicine

## 2022-08-27 NOTE — Telephone Encounter (Signed)
Message sent 10/27 2:35 pm for scheduling:   Hello, patient need [place] prior to 4/26 appointment. Would you be able to assist?

## 2022-08-27 NOTE — Assessment & Plan Note (Addendum)
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  OCT 2023 iron  Studies- iron sat-18% HOLD off phlebotomy at this time.   #Thrombocytopenia-platelets 83  [baseline 100] -secondary to hypersplenism cirrhosis-monitor closely.  Especially patient on Xarelto.  # Elevated AFP -question secondary cirrhosis; SEP 2021-MRI liver negative for any lesions. Ultrasound-OCT  2023-negative.  Continue surveillance-ultrasound every 6 months.  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 Defer to Gi [Dr.Wohl]. STABLE   # Active smoker- trying to quit/ Recommend quitting smoking/LCSP [April 0626]-  # Paroxysmal atrial fibrillation [Dr.Etang] s/p ablation September 2020 at [facility]. on BB , Xarelto-discussed watching out for bleeding while on Xarelto.  # DISPOSITION: # NO phlebotomy today # follow up in 6 months- MD; 1 week prior-labs- cbc/cmp;Iron studies/ferritin; AFP; [place] abodmen prior-; possible phlebotomy-Dr.B

## 2022-08-27 NOTE — Progress Notes (Signed)
[place] NOTE  Patient Care Team: Mechele Claude, FNP as PCP - General (Family Medicine) Kate Sable, MD as PCP - Cardiology (Cardiology)  CHIEF COMPLAINTS/PURPOSE OF CONSULTATION: Hemochromatosis.   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  Two copies of H63D were identified.  C282Y and S65C were negative;Elevated Ferritin/I sat-88%;  SEP 2021- s/p Phlebotomy x3  # Elevated AFP- 73 [SEP 2021]; NOV 2021- MRI- liver NEG;  # Cirrhosis/ HEP C [started end of sep,2021 x 12 weeks; Dr.Wohl]  # CAD- on xarelto.   HISTORY OF PRESENTING ILLNESS: Ambulating independently.  Alone.  Gabriel Hoffman 66 y.o.  male history of hepatitis C/cirrhosis splenomegaly and history of hereditary hemochromatosis/elevated iron saturation/ferritin is here for follow-up/review results of the liver ultrasound.  Patient denies any worsening abdominal pain nausea vomiting diarrhea.  Denies any leg swelling.  Chronic lower extremity cramps.  Not any worse.  Patient continues to have chronic back pain joint pains.  Joint pains back pain not any worse.   Review of Systems  Constitutional:  Positive for malaise/fatigue. Negative for chills, diaphoresis, fever and weight loss.  HENT:  Negative for nosebleeds and sore throat.   Eyes:  Negative for double vision.  Respiratory:  Negative for cough, hemoptysis, sputum production, shortness of breath and wheezing.   Cardiovascular:  Negative for chest pain, palpitations, orthopnea and leg swelling.  Gastrointestinal:  Negative for abdominal pain, blood in stool, constipation, diarrhea, heartburn, melena, nausea and vomiting.  Genitourinary:  Negative for dysuria, frequency and urgency.  Musculoskeletal:  Positive for back pain, joint pain and myalgias.  Skin: Negative.  Negative for itching and rash.   Neurological:  Negative for dizziness, tingling, focal weakness, weakness and headaches.  Endo/Heme/Allergies:  Does not bruise/bleed easily.  Psychiatric/Behavioral:  Negative for depression. The patient is not nervous/anxious and does not have insomnia.     MEDICAL HISTORY:  Past Medical History:  Diagnosis Date   Anginal pain ([place])    Anxiety    Aortic atherosclerosis ([place])    Arthritis    Atrial fibrillation ([place])    a.) CHA2DS2-VASc Score = 2 (HTN, vascular disease/prior MI).  b.) Rate/rhythm maintained on metoprolol succinate; chronically anticoagulated with rivaroxaban.   CAD (coronary artery disease)    Carotid artery stenosis    Cirrhosis ([place]) [id number]   Complication of anesthesia    a.) seizure like activity with propofol during colonoscopy   COPD (chronic obstructive pulmonary disease) ([place])    Current use of long term anticoagulation    a.) Rivaroxaban   Diastolic dysfunction 0000000   a.) TTE 02/25/2020: normal LV function; EF 55-65%; GLS -15.8%; G1DD.   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 C282Y and S65C were negative.   Hepatitis C 06/15/2020   a.) Tx'd with Harvoni course   HLD (hyperlipidemia)    Hypertension    Hypothyroidism    Lung nodule    Myocardial infarction ([place]) 2019   x2 MI's no stents   Thrombocytopenia ([place])     SURGICAL HISTORY: Past Surgical History:  Procedure Laterality Date   CARDIAC CATHETERIZATION     CATARACT EXTRACTION Bilateral    COLONOSCOPY     FRACTURE SURGERY Left    pins rods left wrist  HARDWARE REMOVAL Right 04/30/2021   Procedure: HARDWARE REMOVAL, Right tibia IM nail removal;  Surgeon: Hessie Knows, MD;  Location: [facility] ORS;  Service: Orthopedics;  Laterality: Right;   HERNIA REPAIR Bilateral    LEG SURGERY Right    rod in leg   TOTAL KNEE ARTHROPLASTY Right 10/02/2021    Procedure: TOTAL KNEE ARTHROPLASTY;  Surgeon: Hessie Knows, MD;  Location: [facility] ORS;  Service: Orthopedics;  Laterality: Right;   UPPER GI ENDOSCOPY      SOCIAL HISTORY: Social History   Socioeconomic History   Marital status: Single    Spouse name: Not on file   Number of children: Not on file   Years of education: Not on file   Highest education level: Not on file  Occupational History   Not on file  Tobacco Use   Smoking status: Every Day    Packs/day: 1.00    Years: 50.00    Total pack years: 50.00    Types: Cigarettes   Smokeless tobacco: Never   Tobacco comments:    Smokes about 4 cigarettes daily  Vaping Use   Vaping Use: Never used  Substance and Sexual Activity   Alcohol use: Never    Comment: Quit   Drug use: Never   Sexual activity: Not on file  Other Topics Concern   Not on file  Social History Narrative   Alcohol/beer once/twice a month; 1/2 ppd; used to work in [employer]. In [place]; with sister.    Social Determinants of Health   Financial Resource Strain: Not on file  Food Insecurity: Not on file  Transportation Needs: Not on file  Physical Activity: Not on file  Stress: Not on file  Social Connections: Not on file  Intimate Partner Violence: Not on file    FAMILY HISTORY: Family History  Problem Relation Age of Onset   Cancer Mother    COPD Mother    Cancer Sister     ALLERGIES:  is allergic to codeine and propofol.  MEDICATIONS:  Current Outpatient Medications  Medication Sig Dispense Refill   ALPRAZolam (XANAX) 0.5 MG tablet Take 1 tablet by mouth as needed.     atorvastatin (LIPITOR) 10 MG tablet TAKE 1 TABLET DAILY. 90 tablet 1   DULoxetine (CYMBALTA) 30 MG capsule Take 30 mg by mouth daily.     escitalopram (LEXAPRO) 10 MG tablet Take 1 tablet (10 mg total) by mouth daily. 90 tablet 1   hydrOXYzine (ATARAX) 25 MG tablet Take 25 mg by mouth daily.     Hypromell-Glycerin-Naphazoline (CLEAR EYES FOR DRY EYES PLUS)  0.8-0.25-0.012 % SOLN Place 1-2 drops into both eyes 3 (three) times daily as needed (dry/irritated eyes).     levothyroxine (SYNTHROID) 125 MCG tablet TAKE 1 TABLET DAILY. 90 tablet 0   lisinopril (ZESTRIL) 10 MG tablet TAKE 1 TABLET DAILY. 90 tablet 1   metoprolol succinate (TOPROL-XL) 100 MG 24 hr tablet Take 100 mg by mouth daily.     nitroGLYCERIN (NITROSTAT) 0.4 MG SL tablet Place 0.4 mg under the tongue every 5 (five) minutes x 3 doses as needed for chest pain.     oxyCODONE (OXY IR/ROXICODONE) 5 MG immediate release tablet Take by mouth daily.     pantoprazole (PROTONIX) 20 MG tablet Take 1 tablet by mouth once daily 90 tablet 1   rivaroxaban (XARELTO) 20 MG TABS tablet Take 1 tablet (20 mg total) by mouth in the morning. 30 tablet 6   oxyCODONE-acetaminophen (PERCOCET/ROXICET) 5-325 MG tablet Take 1 tablet  by mouth daily as needed. (Patient not taking: Reported on 08/27/2022)     No current facility-administered medications for this visit.      PHYSICAL EXAMINATION:   Vitals:   08/27/22 1404  BP: 123/66  Pulse: 82  Resp: 18  Temp: 98.7 F (37.1 C)  SpO2: 95%   Filed Weights   08/27/22 1404  Weight: 162 lb (73.5 kg)    Physical Exam HENT:     Head: Normocephalic and atraumatic.     Mouth/Throat:     Pharynx: No oropharyngeal exudate.  Eyes:     Pupils: Pupils are equal, round, and reactive to light.  Cardiovascular:     Rate and Rhythm: Normal rate and regular rhythm.  Pulmonary:     Effort: No respiratory distress.     Breath sounds: No wheezing.     Comments: Decreased air entry bilaterally.  Abdominal:     General: Bowel sounds are normal. There is no distension.     Palpations: Abdomen is soft. There is no mass.     Tenderness: There is no abdominal tenderness. There is no guarding or rebound.  Musculoskeletal:        General: No tenderness. Normal range of motion.     Cervical back: Normal range of motion and neck supple.  Skin:    General: Skin is  warm.  Neurological:     Mental Status: He is alert and oriented to person, place, and time.  Psychiatric:        Mood and Affect: Affect normal.     LABORATORY DATA:  I have reviewed the data as listed Lab Results  Component Value Date   WBC 6.7 08/20/2022   HGB 14.7 08/20/2022   HCT 43.0 08/20/2022   MCV 94.3 08/20/2022   PLT 83 (L) 08/20/2022   Recent Labs    10/03/21 0448 12/15/21 1532 02/17/22 1052 08/20/22 0908  NA 131* 135 132* 138  K 4.0 3.8 3.4* 3.8  CL 102 97 102 107  CO2 23 21 24 25   GLUCOSE 129* 92 115* 170*  BUN 13 21 16 19   CREATININE 0.85 1.12 0.89 1.07  CALCIUM 8.2* 9.6 9.2 9.1  GFRNONAA >60  --  >60 >60  PROT  --  7.5 7.7 7.9  ALBUMIN  --  4.0 3.7 3.7  AST  --  18 22 23   ALT  --  14 17 18   ALKPHOS  --  80 70 66  BILITOT  --  0.7 1.1 0.9     US ABDOMEN LIMITED RUQ (LIVER/GB)  Result Date: 08/26/2022 CLINICAL DATA:  Elevated LFTs.  Right quadrant EXAM: ULTRASOUND ABDOMEN LIMITED RIGHT UPPER QUADRANT COMPARISON:  Ultrasound abdomen 02/08/2022 FINDINGS: Gallbladder: No gallstones or wall thickening visualized. No sonographic Murphy sign noted by sonographer. Common bile duct: Diameter: 3 mm Liver: Heterogeneous echogenicity and nodular contour. No focal lesion. Portal vein is patent on color Doppler imaging with normal direction of blood flow towards the liver. Other: None. IMPRESSION: 1. Cirrhotic morphology of the liver.  No focal lesion. 2. No cholelithiasis or sonographic evidence for acute cholecystitis. Electronically Signed   By: Lovey Newcomer M.D.   On: 08/26/2022 13:45    Hemochromatosis, hereditary ([place]) # Hereditary hemochromatosis-[Elevated iron saturation-iron studies; unclear if patient has organ involvement-given the hepatitis C/cirrhosis]- s/p phelbtomy x3.  OCT 2023 iron  Studies- iron sat-18% HOLD off phlebotomy at this time.   #Thrombocytopenia-platelets 83  [baseline 100] -secondary to hypersplenism cirrhosis-monitor closely.   Especially patient  on Xarelto.  # Elevated AFP -question secondary cirrhosis; SEP 2021-MRI liver negative for any lesions. Ultrasound-OCT  2023-negative.  Continue surveillance-ultrasound every 6 months.  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 Defer to Gi [Dr.Wohl]. STABLE   # Active smoker- trying to quit/ Recommend quitting smoking/LCSP [April [id number]-  # Paroxysmal atrial fibrillation [Dr.Etang] s/p ablation September 2020 at [facility]. on BB , Xarelto-discussed watching out for bleeding while on Xarelto.  # DISPOSITION: # NO phlebotomy today # follow up in 6 months- MD; 1 week prior-labs- cbc/cmp;Iron studies/ferritin; AFP; [place] abodmen prior-; possible phlebotomy-Dr.B      All questions were answered. The patient knows to call the clinic with any problems, questions or concerns.   Cammie Sickle, MD 08/27/2022 2:40 PM

## 2022-08-27 NOTE — Progress Notes (Signed)
Patient here for oncology follow-up appointment, concerns of leg cramps

## 2022-08-30 ENCOUNTER — Encounter (INDEPENDENT_AMBULATORY_CARE_PROVIDER_SITE_OTHER): Payer: Self-pay

## 2022-09-03 ENCOUNTER — Ambulatory Visit
Admission: RE | Admit: 2022-09-03 | Discharge: 2022-09-03 | Disposition: A | Discharge status: Home / Self Care | Payer: Medicaid Other | Source: Ambulatory Visit | Attending: Anesthesiology | Admitting: Anesthesiology

## 2022-09-03 DIAGNOSIS — M5116 Intervertebral disc disorders with radiculopathy, lumbar region: Secondary | ICD-10-CM | POA: Insufficient documentation

## 2022-10-19 DIAGNOSIS — I6529 Occlusion and stenosis of unspecified carotid artery: Secondary | ICD-10-CM | POA: Insufficient documentation

## 2022-10-19 NOTE — Progress Notes (Unsigned)
MRN : [id number]  Gabriel Hoffman is a 66 y.o. (09-22-1956) male who presents with chief complaint of check carotid arteries.  History of Present Illness:   The patient is seen for evaluation of carotid stenosis. The carotid stenosis was identified after carotid ultrasound.     The patient denies amaurosis fugax. There is no recent history of TIA symptoms or focal motor deficits. There is no prior documented CVA.  He does endorse having some numbness and tingling in his hand, however the patient does have known back issues as well.   There is no history of migraine headaches. There is no history of seizures.   The patient is taking enteric-coated aspirin 81 mg daily.   No recent shortening of the patient's walking distance or new symptoms consistent with claudication.  No history of rest pain symptoms. No new ulcers or wounds of the lower extremities have occurred.   There is no history of DVT, PE or superficial thrombophlebitis. No recent episodes of angina or shortness of breath documented.   No outpatient medications have been marked as taking for the 10/21/22 encounter (Appointment) with Gilda Crease, Latina Craver, MD.    Past Medical History:  Diagnosis Date   Anginal pain (H[facility])    Anxiety    Aortic atherosclerosis (H[facility])    Arthritis    Atrial fibrillation ([facility])    a.) CHA2DS2-VASc Score = 2 (HTN, vascular disease/prior MI).  b.) Rate/rhythm maintained on metoprolol succinate; chronically anticoagulated with rivaroxaban.   CAD (coronary artery disease)    Carotid artery stenosis    Cirrhosis ([facility]) 06/15/2020   Complication of anesthesia    a.) seizure like activity with propofol during colonoscopy   COPD (chronic obstructive pulmonary disease) ([facility])    Current use of long term anticoagulation    a.) Rivaroxaban   Diastolic dysfunction 02/25/2020   a.) TTE 02/25/2020: normal LV function; EF 55-65%; GLS -15.8%; G1DD.   Dyspnea    GERD (gastroesophageal reflux disease)     Hemochromatosis    a.) Two copies of the same mutation (H63D and H63D) identified; (HH) mutations C282Y, H63D, and S65C. C282Y and S65C were negative.   Hepatitis C 06/15/2020   a.) Tx'd with Harvoni course   HLD (hyperlipidemia)    Hypertension    Hypothyroidism    Lung nodule    Myocardial infarction [facility]) 2019   x2 MI's no stents   Thrombocytopenia [facility])     Past Surgical History:  Procedure Laterality Date   CARDIAC CATHETERIZATION     CATARACT EXTRACTION Bilateral    COLONOSCOPY     FRACTURE SURGERY Left    pins rods left wrist   HARDWARE REMOVAL Right 04/30/2021   Procedure: HARDWARE REMOVAL, Right tibia IM nail removal;  Surgeon: Kennedy Bucker, MD;  Location: [facility] ORS;  Service: Orthopedics;  Laterality: Right;   HERNIA REPAIR Bilateral    LEG SURGERY Right    rod in leg   TOTAL KNEE ARTHROPLASTY Right 10/02/2021   Procedure: TOTAL KNEE ARTHROPLASTY;  Surgeon: Kennedy Bucker, MD;  Location: [facility] ORS;  Service: Orthopedics;  Laterality: Right;   UPPER GI ENDOSCOPY      Social History Social History   Tobacco Use   Smoking status: Every Day    Packs/day: 1.00    Years: 50.00    Total pack years: 50.00    Types: Cigarettes   Smokeless tobacco: Never   Tobacco comments:    Smokes about 4 cigarettes daily  Vaping Use  Vaping Use: Never used  Substance Use Topics   Alcohol use: Never    Comment: Quit   Drug use: Never    Family History Family History  Problem Relation Age of Onset   Cancer Mother    COPD Mother    Cancer Sister     Allergies  Allergen Reactions   Codeine Nausea And Vomiting    With fever   Propofol Other (See Comments)    Seizure like activity during colonoscopy.  Tolerated propofol infusion with no reaction at all 10/02/21.     REVIEW OF SYSTEMS (Negative unless checked)  Constitutional: [] Weight loss  [] Fever  [] Chills Cardiac: [] Chest pain   [] Chest pressure   [] Palpitations   [] Shortness of breath when laying flat    [] Shortness of breath with exertion. Vascular:  [x] Pain in legs with walking   [] Pain in legs at rest  [] History of DVT   [] Phlebitis   [] Swelling in legs   [] Varicose veins   [] Non-healing ulcers Pulmonary:   [] Uses home oxygen   [] Productive cough   [] Hemoptysis   [] Wheeze  [] COPD   [] Asthma Neurologic:  [] Dizziness   [] Seizures   [] History of stroke   [] History of TIA  [] Aphasia   [] Vissual changes   [] Weakness or numbness in arm   [] Weakness or numbness in leg Musculoskeletal:   [] Joint swelling   [] Joint pain   [] Low back pain Hematologic:  [] Easy bruising  [] Easy bleeding   [] Hypercoagulable state   [] Anemic Gastrointestinal:  [] Diarrhea   [] Vomiting  [] Gastroesophageal reflux/heartburn   [] Difficulty swallowing. Genitourinary:  [] Chronic kidney disease   [] Difficult urination  [] Frequent urination   [] Blood in urine Skin:  [] Rashes   [] Ulcers  Psychological:  [] History of anxiety   []  History of major depression.  Physical Examination  There were no vitals filed for this visit. There is no height or weight on file to calculate BMI. Gen: WD/WN, NAD Head: [place]/AT, No temporalis wasting.  Ear/Nose/Throat: Hearing grossly intact, nares w/o erythema or drainage Eyes: PER, EOMI, sclera nonicteric.  Neck: Supple, no masses.  No bruit or JVD.  Pulmonary:  Good air movement, no audible wheezing, no use of accessory muscles.  Cardiac: RRR, normal S1, S2, no Murmurs. Vascular:  carotid bruit noted Vessel Right Left  Radial Palpable Palpable  Carotid  Palpable  Palpable  Subclav  Palpable Palpable  Gastrointestinal: soft, non-distended. No guarding/no peritoneal signs.  Musculoskeletal: M/S 5/5 throughout.  No visible deformity.  Neurologic: CN 2-12 intact. Pain and light touch intact in extremities.  Symmetrical.  Speech is fluent. Motor exam as listed above. Psychiatric: Judgment intact, Mood & affect appropriate for pt's clinical situation. Dermatologic: No rashes or ulcers noted.  No  changes consistent with cellulitis.   CBC Lab Results  Component Value Date   WBC 6.7 08/20/2022   HGB 14.7 08/20/2022   HCT 43.0 08/20/2022   MCV 94.3 08/20/2022   PLT 83 (L) 08/20/2022    BMET    Component Value Date/Time   NA 138 08/20/2022 0908   NA 135 12/15/2021 1532   NA 143 04/24/2012 1746   K 3.8 08/20/2022 0908   K 3.6 04/24/2012 1746   CL 107 08/20/2022 0908   CL 109 (H) 04/24/2012 1746   CO2 25 08/20/2022 0908   CO2 23 04/24/2012 1746   GLUCOSE 170 (H) 08/20/2022 0908   GLUCOSE 80 04/24/2012 1746   BUN 19 08/20/2022 0908   BUN 21 12/15/2021 1532   BUN 7 04/24/2012  1746   CREATININE 1.07 08/20/2022 0908   CREATININE 0.87 04/24/2012 1746   CALCIUM 9.1 08/20/2022 0908   CALCIUM 8.9 04/24/2012 1746   GFRNONAA >60 08/20/2022 0908   GFRNONAA >60 04/24/2012 1746   GFRAA >60 07/25/2020 1216   GFRAA >60 04/24/2012 1746   CrCl cannot be calculated (Patient's most recent lab result is older than the maximum 21 days allowed.).  COAG Lab Results  Component Value Date   INR 1.1 09/21/2021    Radiology No results found.   Assessment/Plan There are no diagnoses linked to this encounter.   Levora Dredge, MD  10/19/2022 3:00 PM

## 2022-10-20 ENCOUNTER — Other Ambulatory Visit (INDEPENDENT_AMBULATORY_CARE_PROVIDER_SITE_OTHER): Payer: Self-pay | Admitting: Nurse Practitioner

## 2022-10-20 DIAGNOSIS — I6523 Occlusion and stenosis of bilateral carotid arteries: Secondary | ICD-10-CM

## 2022-10-21 ENCOUNTER — Encounter (INDEPENDENT_AMBULATORY_CARE_PROVIDER_SITE_OTHER): Payer: Self-pay | Admitting: Vascular Surgery

## 2022-10-21 ENCOUNTER — Ambulatory Visit (INDEPENDENT_AMBULATORY_CARE_PROVIDER_SITE_OTHER): Payer: Medicaid Other | Admitting: Vascular Surgery

## 2022-10-21 ENCOUNTER — Ambulatory Visit (INDEPENDENT_AMBULATORY_CARE_PROVIDER_SITE_OTHER): Payer: Medicaid Other

## 2022-10-21 VITALS — BP 106/61 | HR 96 | Resp 18 | Ht 65.0 in | Wt 162.0 lb

## 2022-10-21 DIAGNOSIS — I10 Essential (primary) hypertension: Secondary | ICD-10-CM

## 2022-10-21 DIAGNOSIS — I6523 Occlusion and stenosis of bilateral carotid arteries: Secondary | ICD-10-CM

## 2022-10-21 DIAGNOSIS — I2583 Coronary atherosclerosis due to lipid rich plaque: Secondary | ICD-10-CM

## 2022-10-21 DIAGNOSIS — I2510 Atherosclerotic heart disease of native coronary artery without angina pectoris: Secondary | ICD-10-CM | POA: Diagnosis not present

## 2022-10-21 DIAGNOSIS — E782 Mixed hyperlipidemia: Secondary | ICD-10-CM | POA: Diagnosis not present

## 2022-12-16 ENCOUNTER — Other Ambulatory Visit (INDEPENDENT_AMBULATORY_CARE_PROVIDER_SITE_OTHER): Payer: Self-pay | Admitting: Nurse Practitioner

## 2022-12-16 ENCOUNTER — Telehealth (INDEPENDENT_AMBULATORY_CARE_PROVIDER_SITE_OTHER): Payer: Self-pay | Admitting: Vascular Surgery

## 2022-12-16 MED ORDER — VARENICLINE TARTRATE 1 MG PO TABS: 1.0000 mg | ORAL_TABLET | Freq: Two times a day (BID) | ORAL | 2 refills | Status: DC

## 2022-12-16 MED ORDER — VARENICLINE TARTRATE 0.5 MG PO TABS: ORAL_TABLET | ORAL | 0 refills | Status: AC

## 2022-12-16 NOTE — Telephone Encounter (Signed)
Patient called in wanting to know if provider could send in medication for patient to stop smoking. '   Please call and advise

## 2022-12-16 NOTE — Telephone Encounter (Signed)
sent

## 2022-12-16 NOTE — Telephone Encounter (Signed)
Patient notified medication has been sent

## 2022-12-20 ENCOUNTER — Other Ambulatory Visit (INDEPENDENT_AMBULATORY_CARE_PROVIDER_SITE_OTHER): Payer: Self-pay | Admitting: Nurse Practitioner

## 2022-12-23 ENCOUNTER — Other Ambulatory Visit: Payer: Self-pay | Admitting: Family

## 2022-12-23 DIAGNOSIS — E039 Hypothyroidism, unspecified: Secondary | ICD-10-CM

## 2023-02-01 ENCOUNTER — Ambulatory Visit: Payer: Medicaid Other | Attending: Internal Medicine

## 2023-02-17 ENCOUNTER — Other Ambulatory Visit: Payer: Self-pay | Admitting: *Deleted

## 2023-02-18 ENCOUNTER — Inpatient Hospital Stay: Payer: Medicaid Other | Attending: Internal Medicine

## 2023-02-18 DIAGNOSIS — R772 Abnormality of alphafetoprotein: Secondary | ICD-10-CM | POA: Insufficient documentation

## 2023-02-18 DIAGNOSIS — I2510 Atherosclerotic heart disease of native coronary artery without angina pectoris: Secondary | ICD-10-CM | POA: Insufficient documentation

## 2023-02-18 DIAGNOSIS — B1920 Unspecified viral hepatitis C without hepatic coma: Secondary | ICD-10-CM | POA: Insufficient documentation

## 2023-02-18 DIAGNOSIS — I480 Paroxysmal atrial fibrillation: Secondary | ICD-10-CM | POA: Insufficient documentation

## 2023-02-18 DIAGNOSIS — Z7901 Long term (current) use of anticoagulants: Secondary | ICD-10-CM | POA: Insufficient documentation

## 2023-02-23 ENCOUNTER — Inpatient Hospital Stay: Payer: Medicaid Other

## 2023-02-23 ENCOUNTER — Ambulatory Visit
Admission: RE | Admit: 2023-02-23 | Discharge: 2023-02-23 | Disposition: A | Discharge status: Home / Self Care | Payer: Medicaid Other | Source: Ambulatory Visit | Attending: Internal Medicine | Admitting: Internal Medicine

## 2023-02-23 DIAGNOSIS — R772 Abnormality of alphafetoprotein: Secondary | ICD-10-CM | POA: Diagnosis not present

## 2023-02-23 DIAGNOSIS — Z7901 Long term (current) use of anticoagulants: Secondary | ICD-10-CM | POA: Diagnosis not present

## 2023-02-23 DIAGNOSIS — I480 Paroxysmal atrial fibrillation: Secondary | ICD-10-CM | POA: Diagnosis not present

## 2023-02-23 DIAGNOSIS — B1920 Unspecified viral hepatitis C without hepatic coma: Secondary | ICD-10-CM | POA: Diagnosis not present

## 2023-02-23 DIAGNOSIS — I2510 Atherosclerotic heart disease of native coronary artery without angina pectoris: Secondary | ICD-10-CM | POA: Diagnosis not present

## 2023-02-23 LAB — FERRITIN: Ferritin: 22 ng/mL — ABNORMAL LOW (ref 24–336)

## 2023-02-23 LAB — CBC
HCT: 44.4 % (ref 39.0–52.0)
Hemoglobin: 15.3 g/dL (ref 13.0–17.0)
MCH: 31.4 pg (ref 26.0–34.0)
MCHC: 34.5 g/dL (ref 30.0–36.0)
MCV: 91 fL (ref 80.0–100.0)
Platelets: 93 10*3/uL — ABNORMAL LOW (ref 150–400)
RBC: 4.88 MIL/uL (ref 4.22–5.81)
RDW: 13.1 % (ref 11.5–15.5)
WBC: 6.5 10*3/uL (ref 4.0–10.5)
nRBC: 0 % (ref 0.0–0.2)

## 2023-02-23 LAB — COMPREHENSIVE METABOLIC PANEL
ALT: 20 U/L (ref 0–44)
AST: 21 U/L (ref 15–41)
Albumin: 4 g/dL (ref 3.5–5.0)
Alkaline Phosphatase: 71 U/L (ref 38–126)
Anion gap: 7 (ref 5–15)
BUN: 16 mg/dL (ref 8–23)
CO2: 25 mmol/L (ref 22–32)
Calcium: 9.4 mg/dL (ref 8.9–10.3)
Chloride: 107 mmol/L (ref 98–111)
Creatinine, Ser: 0.94 mg/dL (ref 0.61–1.24)
GFR, Estimated: >60 mL/min (ref >60)
Glucose, Bld: 99 mg/dL (ref 70–99)
Potassium: 4.3 mmol/L (ref 3.5–5.1)
Sodium: 139 mmol/L (ref 135–145)
Total Bilirubin: 0.9 mg/dL (ref 0.3–1.2)
Total Protein: 8 g/dL (ref 6.5–8.1)

## 2023-02-23 LAB — IRON AND TIBC
Iron: 85 ug/dL (ref 45–182)
Saturation Ratios: 19 % (ref 17.9–39.5)
TIBC: 455 ug/dL — ABNORMAL HIGH (ref 250–450)
UIBC: 370 ug/dL

## 2023-02-24 LAB — AFP TUMOR MARKER: AFP, Serum, Tumor Marker: 2.3 ng/mL (ref 0.0–8.4)

## 2023-02-25 ENCOUNTER — Inpatient Hospital Stay: Payer: Medicaid Other

## 2023-02-25 ENCOUNTER — Encounter: Payer: Self-pay | Admitting: Internal Medicine

## 2023-02-25 ENCOUNTER — Inpatient Hospital Stay (HOSPITAL_BASED_OUTPATIENT_CLINIC_OR_DEPARTMENT_OTHER): Payer: Medicaid Other | Admitting: Internal Medicine

## 2023-02-25 DIAGNOSIS — Z9189 Other specified personal risk factors, not elsewhere classified: Secondary | ICD-10-CM | POA: Diagnosis not present

## 2023-02-25 DIAGNOSIS — K769 Liver disease, unspecified: Secondary | ICD-10-CM

## 2023-02-25 MED ORDER — DIAZEPAM 2 MG PO TABS: ORAL_TABLET | ORAL | 0 refills | Status: DC

## 2023-02-25 NOTE — Assessment & Plan Note (Addendum)
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  APRIL 2024- ferritin-LOW.  HOLD off phlebotomy at this time.   #Thrombocytopenia-platelets 93  [baseline 100] -secondary to hypersplenism cirrhosis-monitor closely.  Especially patient on Xarelto.  # Elevated AFP -question secondary cirrhosis; APRIL 25th, 2024- Cirrhotic liver with a 12 mm hypoechoic mass in the left hepatic lobe. While the hypoechoic mass may represent a complicated cyst, a solid mass is not completely excluded, especially given known cirrhosis.  Ordered MRI of the abdomen with and without contrast for more complete evaluation. APRIL 2024- AFP-WNL.   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 Defer to Gi [Dr.Wohl]. stable  # Active smoker- trying to quit/ Recommend quitting smoking/LCSP [April 2022]- stable  # Paroxysmal atrial fibrillation [Dr.Etang] s/p ablation September 2020 at [facility]. on BB , Xarelto-discussed watching out for bleeding while on Xarelto. stable  #Incidental findings on Imaging  [place] , 2024: Gallbladder wall thickening; Splenomegaly. I reviewed/discussed/counseled the patient.   # DISPOSITION: # MRI liver  # NO phlebotomy today # follow up in 2-3 DAYS after the MRI liver-; MD: no labs-  Dr.B

## 2023-02-25 NOTE — Progress Notes (Signed)
[facility] CONSULT NOTE  Patient Care Team: Miki Kins, FNP as PCP - General (Family Medicine) Debbe Odea, MD as PCP - Cardiology (Cardiology)  CHIEF COMPLAINTS/PURPOSE OF CONSULTATION: Hemochromatosis.   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  Two copies of H63D were identified.  C282Y and S65C were negative;Elevated Ferritin/I sat-88%;  SEP 2021- s/p Phlebotomy x3  # Elevated AFP- 73 [SEP 2021]; NOV 2021- MRI- liver NEG;  # Cirrhosis/ HEP C [started end of sep,2021 x 12 weeks; Dr.Wohl]  # CAD- on xarelto.   HISTORY OF PRESENTING ILLNESS: Ambulating independently. With sister.   Gabriel Hoffman 67 y.o.  male history of hepatitis C/cirrhosis splenomegaly and history of hereditary hemochromatosis/elevated iron saturation/ferritin is here for follow-up/review results of the liver ultrasound.  Patient denies any worsening abdominal pain nausea vomiting diarrhea.  Denies any leg swelling.  Chronic lower extremity cramps.  Not any worse.  Patient continues to have chronic back pain joint pains.  Joint pains back pain not any worse.   Review of Systems  Constitutional:  Positive for malaise/fatigue. Negative for chills, diaphoresis, fever and weight loss.  HENT:  Negative for nosebleeds and sore throat.   Eyes:  Negative for double vision.  Respiratory:  Negative for cough, hemoptysis, sputum production, shortness of breath and wheezing.   Cardiovascular:  Negative for chest pain, palpitations, orthopnea and leg swelling.  Gastrointestinal:  Negative for abdominal pain, blood in stool, constipation, diarrhea, heartburn, melena, nausea and vomiting.  Genitourinary:  Negative for dysuria, frequency and urgency.  Musculoskeletal:  Positive for back pain, joint pain and myalgias.  Skin: Negative.  Negative for itching and rash.   Neurological:  Negative for dizziness, tingling, focal weakness, weakness and headaches.  Endo/Heme/Allergies:  Does not bruise/bleed easily.  Psychiatric/Behavioral:  Negative for depression. The patient is not nervous/anxious and does not have insomnia.     MEDICAL HISTORY:  Past Medical History:  Diagnosis Date   Anginal pain (HCC)    Anxiety    Aortic atherosclerosis (HCC)    Arthritis    Atrial fibrillation (HCC)    a.) CHA2DS2-VASc Score = 2 (HTN, vascular disease/prior MI).  b.) Rate/rhythm maintained on metoprolol succinate; chronically anticoagulated with rivaroxaban.   CAD (coronary artery disease)    Carotid artery stenosis    Cirrhosis (HCC) 06/15/2020   Complication of anesthesia    a.) seizure like activity with propofol during colonoscopy   COPD (chronic obstructive pulmonary disease) (HCC)    Current use of long term anticoagulation    a.) Rivaroxaban   Diastolic dysfunction 02/25/2020   a.) TTE 02/25/2020: normal LV function; EF 55-65%; GLS -15.8%; G1DD.   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 C282Y and S65C were negative.   Hepatitis C 06/15/2020   a.) Tx'd with Harvoni course   HLD (hyperlipidemia)    Hypertension    Hypothyroidism    Lung nodule    Myocardial infarction (HCC) 2019   x2 MI's no stents   Thrombocytopenia (HCC)     SURGICAL HISTORY: Past Surgical History:  Procedure Laterality Date   CARDIAC CATHETERIZATION     CATARACT EXTRACTION Bilateral    COLONOSCOPY     FRACTURE SURGERY Left    pins rods left wrist  HARDWARE REMOVAL Right 04/30/2021   Procedure: HARDWARE REMOVAL, Right tibia IM nail removal;  Surgeon: Kennedy Bucker, MD;  Location: [facility] ORS;  Service: Orthopedics;  Laterality: Right;   HERNIA REPAIR Bilateral    LEG SURGERY Right    rod in leg   TOTAL KNEE ARTHROPLASTY Right 10/02/2021    Procedure: TOTAL KNEE ARTHROPLASTY;  Surgeon: Kennedy Bucker, MD;  Location: [facility] ORS;  Service: Orthopedics;  Laterality: Right;   UPPER GI ENDOSCOPY      SOCIAL HISTORY: Social History   Socioeconomic History   Marital status: Single    Spouse name: Not on file   Number of children: Not on file   Years of education: Not on file   Highest education level: Not on file  Occupational History   Not on file  Tobacco Use   Smoking status: Every Day    Packs/day: 1.00    Years: 50.00    Additional pack years: 0.00    Total pack years: 50.00    Types: Cigarettes   Smokeless tobacco: Never   Tobacco comments:    Smokes about 4 cigarettes daily  Vaping Use   Vaping Use: Never used  Substance and Sexual Activity   Alcohol use: Never    Comment: Quit   Drug use: Never   Sexual activity: Not on file  Other Topics Concern   Not on file  Social History Narrative   Alcohol/beer once/twice a month; 1/2 ppd; used to work in [employer]. In [place]; with sister.    Social Determinants of Health   Financial Resource Strain: Not on file  Food Insecurity: Not on file  Transportation Needs: Not on file  Physical Activity: Not on file  Stress: Not on file  Social Connections: Not on file  Intimate Partner Violence: Not on file    FAMILY HISTORY: Family History  Problem Relation Age of Onset   Cancer Mother    COPD Mother    Cancer Sister     ALLERGIES:  is allergic to codeine and propofol.  MEDICATIONS:  Current Outpatient Medications  Medication Sig Dispense Refill   ALPRAZolam (XANAX) 0.5 MG tablet Take 1 tablet by mouth as needed.     atorvastatin (LIPITOR) 10 MG tablet TAKE 1 TABLET DAILY. 90 tablet 1   diazepam (VALIUM) 2 MG tablet One pill 45-60 mins prior to procedure; 1 pill 15 mins prior if needed. 3 tablet 0   DULoxetine (CYMBALTA) 30 MG capsule Take 30 mg by mouth daily.     escitalopram (LEXAPRO) 10 MG tablet Take 1 tablet (10 mg total) by mouth daily. 90  tablet 1   hydrOXYzine (ATARAX) 25 MG tablet Take 25 mg by mouth daily.     Hypromell-Glycerin-Naphazoline (CLEAR EYES FOR DRY EYES PLUS) 0.8-0.25-0.012 % SOLN Place 1-2 drops into both eyes 3 (three) times daily as needed (dry/irritated eyes).     levothyroxine (SYNTHROID) 125 MCG tablet TAKE 1 TABLET DAILY. 90 tablet 0   lisinopril (ZESTRIL) 10 MG tablet TAKE 1 TABLET DAILY. 90 tablet 1   metoprolol succinate (TOPROL-XL) 100 MG 24 hr tablet Take 100 mg by mouth daily.     nitroGLYCERIN (NITROSTAT) 0.4 MG SL tablet Place 0.4 mg under the tongue every 5 (five) minutes x 3 doses as needed for chest pain.     oxyCODONE (OXY IR/ROXICODONE) 5 MG immediate release tablet Take by mouth daily.     oxyCODONE-acetaminophen (PERCOCET/ROXICET) 5-325 MG tablet Take 1 tablet by mouth daily as needed.  pantoprazole (PROTONIX) 20 MG tablet Take 1 tablet by mouth once daily 90 tablet 1   rivaroxaban (XARELTO) 20 MG TABS tablet Take 1 tablet (20 mg total) by mouth in the morning. 30 tablet 6   varenicline (CHANTIX) 1 MG tablet Take 1 tablet (1 mg total) by mouth 2 (two) times daily. Following completion of the 0.5 mg tablets, Take 1 tablet by mouth 2 (two) times daily 56 tablet 1   No current facility-administered medications for this visit.      PHYSICAL EXAMINATION:   Vitals:   02/25/23 1335  BP: 137/84  Pulse: 84  Temp: 98.5 F (36.9 C)  SpO2: 96%   Filed Weights   02/25/23 1335  Weight: 160 lb 6.4 oz (72.8 kg)    Physical Exam HENT:     Head: Normocephalic and atraumatic.     Mouth/Throat:     Pharynx: No oropharyngeal exudate.  Eyes:     Pupils: Pupils are equal, round, and reactive to light.  Cardiovascular:     Rate and Rhythm: Normal rate and regular rhythm.  Pulmonary:     Effort: No respiratory distress.     Breath sounds: No wheezing.     Comments: Decreased air entry bilaterally.  Abdominal:     General: Bowel sounds are normal. There is no distension.     Palpations:  Abdomen is soft. There is no mass.     Tenderness: There is no abdominal tenderness. There is no guarding or rebound.  Musculoskeletal:        General: No tenderness. Normal range of motion.     Cervical back: Normal range of motion and neck supple.  Skin:    General: Skin is warm.  Neurological:     Mental Status: He is alert and oriented to person, place, and time.  Psychiatric:        Mood and Affect: Affect normal.     LABORATORY DATA:  I have reviewed the data as listed Lab Results  Component Value Date   WBC 6.5 02/23/2023   HGB 15.3 02/23/2023   HCT 44.4 02/23/2023   MCV 91.0 02/23/2023   PLT 93 (L) 02/23/2023   Recent Labs    08/20/22 0908 02/23/23 0914  NA 138 139  K 3.8 4.3  CL 107 107  CO2 25 25  GLUCOSE 170* 99  BUN 19 16  CREATININE 1.07 0.94  CALCIUM 9.1 9.4  GFRNONAA >60 >60  PROT 7.9 8.0  ALBUMIN 3.7 4.0  AST 23 21  ALT 18 20  ALKPHOS 66 71  BILITOT 0.9 0.9     US Abdomen Complete  Result Date: 02/24/2023 CLINICAL DATA:  Hemochromatosis, cirrhosis, and splenomegaly EXAM: ABDOMEN ULTRASOUND COMPLETE COMPARISON:  Ultrasound August 26, 2022 FINDINGS: Gallbladder: Gallbladder wall is borderline in thickness measuring 3.3 mm. No other abnormalities. Common bile duct: Diameter: 4 mm Liver: Nodular contour with increased heterogeneous echogenicity. Contains a 12 mm hypoechoic mass in the left hepatic lobe of with possible enhancement posterior wall and no identified internal blood flow. Portal vein is patent on color Doppler imaging with normal direction of blood flow towards the liver. IVC: No abnormality visualized. Pancreas: Not well visualized due to shadowing bowel gas. Spleen: Splenomegaly with a length of 15.3 cm in a volume of 1184 cc Right Kidney: Length: 9.8 cm. Echogenicity within normal limits. No mass or hydronephrosis visualized. Left Kidney: Length: 11.4 cm. Echogenicity within normal limits. No mass or hydronephrosis visualized. Abdominal  aorta: No aneurysm visualized. Other  findings: None. IMPRESSION: 1. Cirrhotic liver with a 12 mm hypoechoic mass in the left hepatic lobe. While the hypoechoic mass may represent a complicated cyst, a solid mass is not completely excluded, especially given known cirrhosis. As a result, recommend an MRI of the abdomen with and without contrast for more complete evaluation. 2. Splenomegaly. 3. Borderline gallbladder wall thickening. 4. The pancreas is not well visualized due to shadowing bowel gas. Electronically Signed   By: Gerome Sam III M.D.   On: 02/24/2023 15:09    Hemochromatosis, hereditary (HCC) # Hereditary hemochromatosis-[Elevated iron saturation-iron studies; unclear if patient has organ involvement-given the hepatitis C/cirrhosis]- s/p phelbtomy x3.  APRIL 2024- ferritin-LOW.  HOLD off phlebotomy at this time.   #Thrombocytopenia-platelets 93  [baseline 100] -secondary to hypersplenism cirrhosis-monitor closely.  Especially patient on Xarelto.  # Elevated AFP -question secondary cirrhosis; APRIL 25th, 2024- Cirrhotic liver with a 12 mm hypoechoic mass in the left hepatic lobe. While the hypoechoic mass may represent a complicated cyst, a solid mass is not completely excluded, especially given known cirrhosis.  Ordered MRI of the abdomen with and without contrast for more complete evaluation. APRIL 2024- AFP-WNL.   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 Defer to Gi [Dr.Wohl]. stable  # Active smoker- trying to quit/ Recommend quitting smoking/LCSP [April 2022]- stable  # Paroxysmal atrial fibrillation [Dr.Etang] s/p ablation September 2020 at [facility]. on BB , Xarelto-discussed watching out for bleeding while on Xarelto. stable  #Incidental findings on Imaging  [place] , 2024: Gallbladder wall thickening; Splenomegaly. I reviewed/discussed/counseled the patient.   # DISPOSITION: # MRI liver  # NO phlebotomy today # follow up in 2-3 DAYS after the MRI liver-;  MD: no labs-  Dr.B    All questions were answered. The patient knows to call the clinic with any problems, questions or concerns.   Earna Coder, MD 02/25/2023 2:28 PM

## 2023-02-25 NOTE — Progress Notes (Signed)
U/S results done at [facility].

## 2023-03-03 ENCOUNTER — Ambulatory Visit
Admission: RE | Admit: 2023-03-03 | Discharge: 2023-03-03 | Disposition: A | Discharge status: Home / Self Care | Payer: Medicaid Other | Source: Ambulatory Visit | Attending: Internal Medicine | Admitting: Internal Medicine

## 2023-03-03 DIAGNOSIS — Z9189 Other specified personal risk factors, not elsewhere classified: Secondary | ICD-10-CM | POA: Diagnosis present

## 2023-03-03 DIAGNOSIS — K769 Liver disease, unspecified: Secondary | ICD-10-CM | POA: Diagnosis present

## 2023-03-03 MED ORDER — GADOBUTROL 1 MMOL/ML IV SOLN
7.5000 mL | Freq: Once | INTRAVENOUS | Status: AC | PRN
  Administered 2023-03-03: 7.5 mL via INTRAVENOUS

## 2023-03-07 ENCOUNTER — Inpatient Hospital Stay: Payer: Medicaid Other | Attending: Internal Medicine | Admitting: Internal Medicine

## 2023-03-07 ENCOUNTER — Encounter: Payer: Self-pay | Admitting: Internal Medicine

## 2023-03-07 DIAGNOSIS — D731 Hypersplenism: Secondary | ICD-10-CM | POA: Diagnosis not present

## 2023-03-07 DIAGNOSIS — R772 Abnormality of alphafetoprotein: Secondary | ICD-10-CM | POA: Insufficient documentation

## 2023-03-07 DIAGNOSIS — R162 Hepatomegaly with splenomegaly, not elsewhere classified: Secondary | ICD-10-CM | POA: Insufficient documentation

## 2023-03-07 DIAGNOSIS — B1920 Unspecified viral hepatitis C without hepatic coma: Secondary | ICD-10-CM | POA: Insufficient documentation

## 2023-03-07 DIAGNOSIS — I480 Paroxysmal atrial fibrillation: Secondary | ICD-10-CM | POA: Insufficient documentation

## 2023-03-07 DIAGNOSIS — D696 Thrombocytopenia, unspecified: Secondary | ICD-10-CM | POA: Diagnosis not present

## 2023-03-07 NOTE — Progress Notes (Signed)
[facility] CONSULT NOTE  Patient Care Team: Miki Kins, FNP as PCP - General (Family Medicine) Debbe Odea, MD as PCP - Cardiology (Cardiology)  CHIEF COMPLAINTS/PURPOSE OF CONSULTATION: Hemochromatosis.  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  Two copies of H63D were identified.  C282Y and S65C were negative;Elevated Ferritin/I sat-88%;  SEP 2021- s/p Phlebotomy x3  # Elevated AFP- 73 [SEP 2021]; NOV 2021- MRI- liver NEG;  # Cirrhosis/ HEP C [started end of sep,2021 x 12 weeks; Dr.Wohl]  # CAD- on xarelto.   HISTORY OF PRESENTING ILLNESS: Ambulating independently. With sister.   Gabriel Hoffman 67 y.o.  male history of hepatitis C/cirrhosis splenomegaly and history of hereditary hemochromatosis/elevated iron saturation/ferritin is here for follow-up/review results of the liver MRI.  Patient denies any new symptoms.  Denies any  denies any worsening abdominal pain nausea vomiting diarrhea.  Denies any leg swelling.  Chronic lower extremity cramps.  Not any worse.  Patient continues to have chronic back pain joint pains.  Joint pains back pain not any worse.   Unfortunately continues to smoke.  Review of Systems  Constitutional:  Positive for malaise/fatigue. Negative for chills, diaphoresis, fever and weight loss.  HENT:  Negative for nosebleeds and sore throat.   Eyes:  Negative for double vision.  Respiratory:  Negative for cough, hemoptysis, sputum production, shortness of breath and wheezing.   Cardiovascular:  Negative for chest pain, palpitations, orthopnea and leg swelling.  Gastrointestinal:  Negative for abdominal pain, blood in stool, constipation, diarrhea, heartburn, melena, nausea and vomiting.  Genitourinary:  Negative for dysuria, frequency and urgency.  Musculoskeletal:  Positive for back pain, joint pain and  myalgias.  Skin: Negative.  Negative for itching and rash.  Neurological:  Negative for dizziness, tingling, focal weakness, weakness and headaches.  Endo/Heme/Allergies:  Does not bruise/bleed easily.  Psychiatric/Behavioral:  Negative for depression. The patient is not nervous/anxious and does not have insomnia.     MEDICAL HISTORY:  Past Medical History:  Diagnosis Date   Anginal pain (HCC)    Anxiety    Aortic atherosclerosis (HCC)    Arthritis    Atrial fibrillation (HCC)    a.) CHA2DS2-VASc Score = 2 (HTN, vascular disease/prior MI).  b.) Rate/rhythm maintained on metoprolol succinate; chronically anticoagulated with rivaroxaban.   CAD (coronary artery disease)    Carotid artery stenosis    Cirrhosis (HCC) 06/15/2020   Complication of anesthesia    a.) seizure like activity with propofol during colonoscopy   COPD (chronic obstructive pulmonary disease) (HCC)    Current use of long term anticoagulation    a.) Rivaroxaban   Diastolic dysfunction 02/25/2020   a.) TTE 02/25/2020: normal LV function; EF 55-65%; GLS -15.8%; G1DD.   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 C282Y and S65C were negative.   Hepatitis C 06/15/2020   a.) Tx'd with Harvoni course   HLD (hyperlipidemia)    Hypertension    Hypothyroidism    Lung nodule    Myocardial infarction (HCC) 2019   x2 MI's no stents   Thrombocytopenia (HCC)     SURGICAL HISTORY: Past Surgical History:  Procedure Laterality Date   CARDIAC CATHETERIZATION     CATARACT EXTRACTION Bilateral    COLONOSCOPY  FRACTURE SURGERY Left    pins rods left wrist   HARDWARE REMOVAL Right 04/30/2021   Procedure: HARDWARE REMOVAL, Right tibia IM nail removal;  Surgeon: Kennedy Bucker, MD;  Location: [facility] ORS;  Service: Orthopedics;  Laterality: Right;   HERNIA REPAIR Bilateral    LEG SURGERY Right     rod in leg   TOTAL KNEE ARTHROPLASTY Right 10/02/2021   Procedure: TOTAL KNEE ARTHROPLASTY;  Surgeon: Kennedy Bucker, MD;  Location: [facility] ORS;  Service: Orthopedics;  Laterality: Right;   UPPER GI ENDOSCOPY      SOCIAL HISTORY: Social History   Socioeconomic History   Marital status: Single    Spouse name: Not on file   Number of children: Not on file   Years of education: Not on file   Highest education level: Not on file  Occupational History   Not on file  Tobacco Use   Smoking status: Every Day    Packs/day: 1.00    Years: 50.00    Additional pack years: 0.00    Total pack years: 50.00    Types: Cigarettes   Smokeless tobacco: Never   Tobacco comments:    Smokes about 4 cigarettes daily  Vaping Use   Vaping Use: Never used  Substance and Sexual Activity   Alcohol use: Never    Comment: Quit   Drug use: Never   Sexual activity: Not on file  Other Topics Concern   Not on file  Social History Narrative   Alcohol/beer once/twice a month; 1/2 ppd; used to work in [employer]. In [place]; with sister.    Social Determinants of Health   Financial Resource Strain: Not on file  Food Insecurity: Not on file  Transportation Needs: Not on file  Physical Activity: Not on file  Stress: Not on file  Social Connections: Not on file  Intimate Partner Violence: Not on file    FAMILY HISTORY: Family History  Problem Relation Age of Onset   Cancer Mother    COPD Mother    Cancer Sister     ALLERGIES:  is allergic to codeine and propofol.  MEDICATIONS:  Current Outpatient Medications  Medication Sig Dispense Refill   ALPRAZolam (XANAX) 0.5 MG tablet Take 1 tablet by mouth as needed.     atorvastatin (LIPITOR) 10 MG tablet TAKE 1 TABLET DAILY. 90 tablet 1   diazepam (VALIUM) 2 MG tablet One pill 45-60 mins prior to procedure; 1 pill 15 mins prior if needed. 3 tablet 0   DULoxetine (CYMBALTA) 30 MG capsule Take 30 mg by mouth daily.     escitalopram (LEXAPRO) 10 MG  tablet Take 1 tablet (10 mg total) by mouth daily. 90 tablet 1   hydrOXYzine (ATARAX) 25 MG tablet Take 25 mg by mouth daily.     Hypromell-Glycerin-Naphazoline (CLEAR EYES FOR DRY EYES PLUS) 0.8-0.25-0.012 % SOLN Place 1-2 drops into both eyes 3 (three) times daily as needed (dry/irritated eyes).     levothyroxine (SYNTHROID) 125 MCG tablet TAKE 1 TABLET DAILY. 90 tablet 0   lisinopril (ZESTRIL) 10 MG tablet TAKE 1 TABLET DAILY. 90 tablet 1   metoprolol succinate (TOPROL-XL) 100 MG 24 hr tablet Take 100 mg by mouth daily.     nitroGLYCERIN (NITROSTAT) 0.4 MG SL tablet Place 0.4 mg under the tongue every 5 (five) minutes x 3 doses as needed for chest pain.     oxyCODONE (OXY IR/ROXICODONE) 5 MG immediate release tablet Take by mouth daily.     oxyCODONE-acetaminophen (PERCOCET/ROXICET) 5-325 MG  tablet Take 1 tablet by mouth daily as needed.     pantoprazole (PROTONIX) 20 MG tablet Take 1 tablet by mouth once daily 90 tablet 1   rivaroxaban (XARELTO) 20 MG TABS tablet Take 1 tablet (20 mg total) by mouth in the morning. 30 tablet 6   varenicline (CHANTIX) 1 MG tablet Take 1 tablet (1 mg total) by mouth 2 (two) times daily. Following completion of the 0.5 mg tablets, Take 1 tablet by mouth 2 (two) times daily 56 tablet 1   No current facility-administered medications for this visit.      PHYSICAL EXAMINATION:   Vitals:   03/07/23 1016  BP: (!) 153/73  Pulse: (!) 59  Temp: (!) 97.4 F (36.3 C)  SpO2: 97%   Filed Weights   03/07/23 1016  Weight: 164 lb 12.8 oz (74.8 kg)    Physical Exam HENT:     Head: Normocephalic and atraumatic.     Mouth/Throat:     Pharynx: No oropharyngeal exudate.  Eyes:     Pupils: Pupils are equal, round, and reactive to light.  Cardiovascular:     Rate and Rhythm: Normal rate and regular rhythm.  Pulmonary:     Effort: No respiratory distress.     Breath sounds: No wheezing.     Comments: Decreased air entry bilaterally.  Abdominal:     General:  Bowel sounds are normal. There is no distension.     Palpations: Abdomen is soft. There is no mass.     Tenderness: There is no abdominal tenderness. There is no guarding or rebound.  Musculoskeletal:        General: No tenderness. Normal range of motion.     Cervical back: Normal range of motion and neck supple.  Skin:    General: Skin is warm.  Neurological:     Mental Status: He is alert and oriented to person, place, and time.  Psychiatric:        Mood and Affect: Affect normal.     LABORATORY DATA:  I have reviewed the data as listed Lab Results  Component Value Date   WBC 6.5 02/23/2023   HGB 15.3 02/23/2023   HCT 44.4 02/23/2023   MCV 91.0 02/23/2023   PLT 93 (L) 02/23/2023   Recent Labs    08/20/22 0908 02/23/23 0914  NA 138 139  K 3.8 4.3  CL 107 107  CO2 25 25  GLUCOSE 170* 99  BUN 19 16  CREATININE 1.07 0.94  CALCIUM 9.1 9.4  GFRNONAA >60 >60  PROT 7.9 8.0  ALBUMIN 3.7 4.0  AST 23 21  ALT 18 20  ALKPHOS 66 71  BILITOT 0.9 0.9     MR LIVER W WO CONTRAST  Result Date: 03/06/2023 CLINICAL DATA:  Cirrhosis. Possible hepatic mass on recent ultrasound. EXAM: MRI ABDOMEN WITHOUT AND WITH CONTRAST TECHNIQUE: Multiplanar multisequence MR imaging of the abdomen was performed both before and after the administration of intravenous contrast. CONTRAST:  7.74mL GADAVIST GADOBUTROL 1 MMOL/ML IV SOLN COMPARISON:  Ultrasound on 02/23/2023 and MRI on 09/22/2020 FINDINGS: This examination is generally limited by respiratory motion artifact throughout. Lower chest: No acute findings. Hepatobiliary: Cirrhosis again demonstrated. No hepatic masses are identified on this exam. Gallbladder is unremarkable. No evidence of biliary ductal dilatation. Pancreas:  No mass or inflammatory changes. Spleen: Borderline enlarged with incidentally noted small sclerotic nodules. No suspicious masses identified. Adrenals/Urinary Tract: No suspicious masses identified. No evidence of  hydronephrosis. Stomach/Bowel: Unremarkable. Vascular/Lymphatic: No pathologically enlarged  lymph nodes identified. No acute vascular findings. Other:  None. Musculoskeletal:  No suspicious bone lesions identified. IMPRESSION: Cirrhosis. This examination is limited by respiratory motion artifact throughout. Within this limitation, no hepatic masses are identified. Electronically Signed   By: Danae Orleans M.D.   On: 03/06/2023 10:31   US Abdomen Complete  Result Date: 02/24/2023 CLINICAL DATA:  Hemochromatosis, cirrhosis, and splenomegaly EXAM: ABDOMEN ULTRASOUND COMPLETE COMPARISON:  Ultrasound August 26, 2022 FINDINGS: Gallbladder: Gallbladder wall is borderline in thickness measuring 3.3 mm. No other abnormalities. Common bile duct: Diameter: 4 mm Liver: Nodular contour with increased heterogeneous echogenicity. Contains a 12 mm hypoechoic mass in the left hepatic lobe of with possible enhancement posterior wall and no identified internal blood flow. Portal vein is patent on color Doppler imaging with normal direction of blood flow towards the liver. IVC: No abnormality visualized. Pancreas: Not well visualized due to shadowing bowel gas. Spleen: Splenomegaly with a length of 15.3 cm in a volume of 1184 cc Right Kidney: Length: 9.8 cm. Echogenicity within normal limits. No mass or hydronephrosis visualized. Left Kidney: Length: 11.4 cm. Echogenicity within normal limits. No mass or hydronephrosis visualized. Abdominal aorta: No aneurysm visualized. Other findings: None. IMPRESSION: 1. Cirrhotic liver with a 12 mm hypoechoic mass in the left hepatic lobe. While the hypoechoic mass may represent a complicated cyst, a solid mass is not completely excluded, especially given known cirrhosis. As a result, recommend an MRI of the abdomen with and without contrast for more complete evaluation. 2. Splenomegaly. 3. Borderline gallbladder wall thickening. 4. The pancreas is not well visualized due to shadowing bowel  gas. Electronically Signed   By: Gerome Sam III M.D.   On: 02/24/2023 15:09    Hemochromatosis, hereditary (HCC) # Hereditary hemochromatosis-[Elevated iron saturation-iron studies; unclear if patient has organ involvement-given the hepatitis C/cirrhosis]- s/p phelbtomy x3.  APRIL 2024- ferritin-LOW.  HOLD off phlebotomy at this time.   #Thrombocytopenia-platelets 93  [baseline 100] -secondary to hypersplenism cirrhosis-monitor closely.  Especially patient on Xarelto.  # Elevated AFP -question secondary cirrhosis; APRIL 25th, 2024- Cirrhotic liver with a 12 mm hypoechoic mass in the left hepatic lobe. While the hypoechoic mass may represent a complicated cyst, a solid mass is not completely excluded, especially given known cirrhosis.  Ordered MRI of the abdomen with and without contrast for more complete evaluation. APRIL 2024- AFP-WNL.  MRI liver with contrast- MAY 5th, 2024- 2024- Cirrhosis; limited by respiratory motion artifact throughout. Within this limitation, no hepatic masses are identified. Will repeat US in 3 months.   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 Defer to Gi [Dr.Wohl]. stable  # Active smoker- trying to quit/ Recommend quitting smoking/LCSP [April 2022]- stable  # Paroxysmal atrial fibrillation [Dr.Etang] s/p ablation September 2020 at [facility]. on BB , Xarelto-discussed watching out for bleeding while on Xarelto. stable  ? Phlebotomy  # DISPOSITION: # follow up  in 3 months- MD: cbc/cmp; AFP- iron studies; ferritin- ; US liver-  Dr.B    All questions were answered. The patient knows to call the clinic with any problems, questions or concerns.   Earna Coder, MD 03/07/2023 11:13 AM

## 2023-03-07 NOTE — Progress Notes (Signed)
MRI results, [facility]

## 2023-03-07 NOTE — Assessment & Plan Note (Signed)
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  APRIL 2024- ferritin-LOW.  HOLD off phlebotomy at this time.   #Thrombocytopenia-platelets 93  [baseline 100] -secondary to hypersplenism cirrhosis-monitor closely.  Especially patient on Xarelto.  # Elevated AFP -question secondary cirrhosis; APRIL 25th, 2024- Cirrhotic liver with a 12 mm hypoechoic mass in the left hepatic lobe. While the hypoechoic mass may represent a complicated cyst, a solid mass is not completely excluded, especially given known cirrhosis.  Ordered MRI of the abdomen with and without contrast for more complete evaluation. APRIL 2024- AFP-WNL.  MRI liver with contrast- MAY 5th, 2024- 2024- Cirrhosis; limited by respiratory motion artifact throughout. Within this limitation, no hepatic masses are identified. Will repeat US in 3 months.   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 Defer to Gi [Dr.Wohl]. stable  # Active smoker- trying to quit/ Recommend quitting smoking/LCSP [April 2022]- stable  # Paroxysmal atrial fibrillation [Dr.Etang] s/p ablation September 2020 at [facility]. on BB , Xarelto-discussed watching out for bleeding while on Xarelto. stable  ? Phlebotomy  # DISPOSITION: # follow up  in 3 months- MD: cbc/cmp; AFP- iron studies; ferritin- ; US liver-  Dr.B

## 2023-03-17 ENCOUNTER — Encounter: Payer: Self-pay | Admitting: Internal Medicine

## 2023-03-17 ENCOUNTER — Ambulatory Visit: Payer: Medicaid Other

## 2023-03-17 ENCOUNTER — Ambulatory Visit: Payer: Medicaid Other | Admitting: Family

## 2023-03-17 ENCOUNTER — Encounter: Payer: Self-pay | Admitting: Family

## 2023-03-17 ENCOUNTER — Ambulatory Visit
Admission: RE | Admit: 2023-03-17 | Discharge: 2023-03-17 | Disposition: A | Discharge status: Home / Self Care | Payer: Medicaid Other | Source: Ambulatory Visit | Attending: Family | Admitting: Family

## 2023-03-17 VITALS — BP 122/70 | HR 65 | Ht 65.0 in | Wt 161.6 lb

## 2023-03-17 DIAGNOSIS — F17210 Nicotine dependence, cigarettes, uncomplicated: Secondary | ICD-10-CM | POA: Diagnosis present

## 2023-03-17 DIAGNOSIS — E039 Hypothyroidism, unspecified: Secondary | ICD-10-CM | POA: Diagnosis not present

## 2023-03-17 DIAGNOSIS — E782 Mixed hyperlipidemia: Secondary | ICD-10-CM

## 2023-03-17 DIAGNOSIS — Z87891 Personal history of nicotine dependence: Secondary | ICD-10-CM | POA: Diagnosis present

## 2023-03-17 DIAGNOSIS — Z122 Encounter for screening for malignant neoplasm of respiratory organs: Secondary | ICD-10-CM | POA: Diagnosis present

## 2023-03-17 DIAGNOSIS — R7303 Prediabetes: Secondary | ICD-10-CM

## 2023-03-17 DIAGNOSIS — I700 Atherosclerosis of aorta: Secondary | ICD-10-CM | POA: Diagnosis not present

## 2023-03-17 DIAGNOSIS — E559 Vitamin D deficiency, unspecified: Secondary | ICD-10-CM

## 2023-03-17 DIAGNOSIS — J439 Emphysema, unspecified: Secondary | ICD-10-CM | POA: Insufficient documentation

## 2023-03-17 DIAGNOSIS — I10 Essential (primary) hypertension: Secondary | ICD-10-CM

## 2023-03-17 DIAGNOSIS — E538 Deficiency of other specified B group vitamins: Secondary | ICD-10-CM

## 2023-03-17 NOTE — Progress Notes (Signed)
Established Patient Office Visit  Subjective:  Patient ID: Gabriel Hoffman, male    DOB: [birth year]  Age: 67 y.o. MRN: [id number]  Chief Complaint  Patient presents with   Follow-up    4 month follow up    Patient is here today for his 3 months follow up.  He has been feeling well since last appointment.   He does not have additional concerns to discuss today.  Labs are due today. He needs refills.   I have reviewed his active problem list, medication list, allergies, notes from last encounter, lab results for his appointment today.    No other concerns at this time.   Past Medical History:  Diagnosis Date   Anginal pain (HCC)    Anxiety    Aortic atherosclerosis (HCC)    Arthritis    Atrial fibrillation (HCC)    a.) CHA2DS2-VASc Score = 2 (HTN, vascular disease/prior MI).  b.) Rate/rhythm maintained on metoprolol succinate; chronically anticoagulated with rivaroxaban.   CAD (coronary artery disease)    Carotid artery stenosis    Cirrhosis (HCC) 06/15/2020   Complication of anesthesia    a.) seizure like activity with propofol during colonoscopy   COPD (chronic obstructive pulmonary disease) (HCC)    Current use of long term anticoagulation    a.) Rivaroxaban   Diastolic dysfunction 02/25/2020   a.) TTE 02/25/2020: normal LV function; EF 55-65%; GLS -15.8%; G1DD.   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 C282Y and S65C were negative.   Hepatitis C 06/15/2020   a.) Tx'd with Harvoni course   HLD (hyperlipidemia)    Hypertension    Hypothyroidism    Lung nodule    Myocardial infarction [facility]) 2019   x2 MI's no stents   Thrombocytopenia [facility])     Past Surgical History:  Procedure Laterality Date   CARDIAC CATHETERIZATION     CATARACT EXTRACTION Bilateral    COLONOSCOPY     FRACTURE SURGERY Left    pins rods left wrist    HARDWARE REMOVAL Right 04/30/2021   Procedure: HARDWARE REMOVAL, Right tibia IM nail removal;  Surgeon: Kennedy Bucker, MD;  Location: [facility] ORS;  Service: Orthopedics;  Laterality: Right;   HERNIA REPAIR Bilateral    LEG SURGERY Right    rod in leg   TOTAL KNEE ARTHROPLASTY Right 10/02/2021   Procedure: TOTAL KNEE ARTHROPLASTY;  Surgeon: Kennedy Bucker, MD;  Location: [facility] ORS;  Service: Orthopedics;  Laterality: Right;   UPPER GI ENDOSCOPY      Social History   Socioeconomic History   Marital status: Single    Spouse name: Not on file   Number of children: Not on file   Years of education: Not on file   Highest education level: Not on file  Occupational History   Not on file  Tobacco Use   Smoking status: Every Day    Packs/day: 1.00    Years: 50.00    Additional pack years: 0.00    Total pack years: 50.00    Types: Cigarettes   Smokeless tobacco: Never   Tobacco comments:    Smokes about 4 cigarettes daily  Vaping Use   Vaping Use: Never used  Substance and Sexual Activity   Alcohol use: Never    Comment: Quit   Drug use: Never   Sexual activity: Not on file  Other  Topics Concern   Not on file  Social History Narrative   Alcohol/beer once/twice a month; 1/2 ppd; used to work in [employer]. In [place]; with sister.    Social Determinants of Health   Financial Resource Strain: Not on file  Food Insecurity: Not on file  Transportation Needs: Not on file  Physical Activity: Not on file  Stress: Not on file  Social Connections: Not on file  Intimate Partner Violence: Not on file    Family History  Problem Relation Age of Onset   Cancer Mother    COPD Mother    Cancer Sister     Allergies  Allergen Reactions   Codeine Nausea And Vomiting    With fever   Propofol Other (See Comments)    Seizure like activity during colonoscopy.  Tolerated propofol infusion with no reaction at all 10/02/21.    Review of Systems  All other systems reviewed and are  negative.      Objective:   BP 122/70   Pulse 65   Ht 5\' 5"  (1.651 m)   Wt 161 lb 9.6 oz (73.3 kg)   SpO2 93%   BMI 26.89 kg/m   Vitals:   03/17/23 0907  BP: 122/70  Pulse: 65  Height: 5\' 5"  (1.651 m)  Weight: 161 lb 9.6 oz (73.3 kg)  SpO2: 93%  BMI (Calculated): 26.89    Physical Exam Vitals and nursing note reviewed.  Constitutional:      Appearance: Normal appearance. He is normal weight.  Eyes:     Pupils: Pupils are equal, round, and reactive to light.  Cardiovascular:     Rate and Rhythm: Normal rate and regular rhythm.     Pulses: Normal pulses.     Heart sounds: Normal heart sounds.  Pulmonary:     Effort: Pulmonary effort is normal.     Breath sounds: Normal breath sounds.  Neurological:     General: No focal deficit present.     Mental Status: He is alert and oriented to person, place, and time. Mental status is at baseline.  Psychiatric:        Mood and Affect: Mood normal.        Behavior: Behavior normal.        Thought Content: Thought content normal.        Judgment: Judgment normal.      No results found for any visits on 03/17/23.  Recent Results (from the past 2160 hour(s))  Iron and TIBC     Status: Abnormal   Collection Time: 02/23/23  9:14 AM  Result Value Ref Range   Iron 85 45 - 182 ug/dL   TIBC 161 (H) 096 - 045 ug/dL   Saturation Ratios 19 17.9 - 39.5 %   UIBC 370 ug/dL    Comment: Performed at [facility] [address] [place], [place] [address]  Ferritin     Status: Abnormal   Collection Time: 02/23/23  9:14 AM  Result Value Ref Range   Ferritin 22 (L) 24 - 336 ng/mL    Comment: Performed at [facility], [address], [place], [place] [address]  Comprehensive metabolic panel     Status: None   Collection Time: 02/23/23  9:14 AM  Result Value Ref Range   Sodium 139 135 - 145 mmol/L   Potassium 4.3 3.5 - 5.1 mmol/L   Chloride 107 98 - 111 mmol/L   CO2 25 22 - 32 mmol/L   Glucose, Bld 99 70 -  99 mg/dL    Comment: Glucose reference range applies only to samples taken after fasting for at least 8 hours.   BUN 16 8 - 23 mg/dL   Creatinine, Ser 1.61 0.61 - 1.24 mg/dL   Calcium 9.4 8.9 - 09.6 mg/dL   Total Protein 8.0 6.5 - 8.1 g/dL   Albumin 4.0 3.5 - 5.0 g/dL   AST 21 15 - 41 U/L   ALT 20 0 - 44 U/L   Alkaline Phosphatase 71 38 - 126 U/L   Total Bilirubin 0.9 0.3 - 1.2 mg/dL   GFR, Estimated >04 >54 mL/min    Comment: (NOTE) Calculated using the CKD-EPI Creatinine Equation (2021)    Anion gap 7 5 - 15    Comment: Performed at [facility], [address] [place], [place] [address]  CBC     Status: Abnormal   Collection Time: 02/23/23  9:14 AM  Result Value Ref Range   WBC 6.5 4.0 - 10.5 K/uL   RBC 4.88 4.22 - 5.81 MIL/uL   Hemoglobin 15.3 13.0 - 17.0 g/dL   HCT 91.4 78.2 - 95.6 %   MCV 91.0 80.0 - 100.0 fL   MCH 31.4 26.0 - 34.0 pg   MCHC 34.5 30.0 - 36.0 g/dL   RDW 21.3 08.6 - 57.8 %   Platelets 93 (L) 150 - 400 K/uL    Comment: SPECIMEN CHECKED FOR CLOTS   nRBC 0.0 0.0 - 0.2 %    Comment: Performed at [facility], [address] [place], [place] [address]  AFP tumor marker     Status: None   Collection Time: 02/23/23  9:14 AM  Result Value Ref Range   AFP, Serum, Tumor Marker 2.3 0.0 - 8.4 ng/mL    Comment: (NOTE) [employer] Electrochemiluminescence Immunoassay (ECLIA) Values obtained with different assay methods or kits cannot be used interchangeably.  Results cannot be interpreted as absolute evidence of the presence or absence of malignant disease. This test is not interpretable in pregnant females. Performed At: [facility] [address] [place], [place] [id number] Jolene Schimke MD MW:[id number]        Assessment & Plan:   Problem List Items Addressed This Visit       Active Problems   Essential hypertension - Primary   Relevant Orders   CBC With Differential   CMP14+EGFR   Mixed hyperlipidemia   Relevant  Orders   Lipid panel   CBC With Differential   CMP14+EGFR   Acquired hypothyroidism   Relevant Orders   CBC With Differential   CMP14+EGFR   TSH   Other Visit Diagnoses     Prediabetes       Relevant Orders   CBC With Differential   CMP14+EGFR   Hemoglobin A1c   Vitamin D deficiency, unspecified       Relevant Orders   VITAMIN D 25 Hydroxy (Vit-D Deficiency, Fractures)   CBC With Differential   CMP14+EGFR   B12 deficiency due to diet       Relevant Orders   CBC With Differential   CMP14+EGFR   Vitamin B12       Return in about 4 months (around 07/18/2023) for F/U.   Total time spent: 30 minutes  Miki Kins, FNP  03/17/2023   This document may have been prepared by [facility] Voice Recognition software and as such may include unintentional dictation errors.

## 2023-03-18 LAB — HEMOGLOBIN A1C
Est. average glucose Bld gHb Est-mCnc: 111 mg/dL
Hgb A1c MFr Bld: 5.5 % (ref 4.8–5.6)

## 2023-03-18 LAB — CMP14+EGFR
ALT: 23 IU/L (ref 0–44)
AST: 19 IU/L (ref 0–40)
Albumin/Globulin Ratio: 1.2 (ref 1.2–2.2)
Albumin: 4.1 g/dL (ref 3.9–4.9)
Alkaline Phosphatase: 91 IU/L (ref 44–121)
BUN/Creatinine Ratio: 20 (ref 10–24)
BUN: 20 mg/dL (ref 8–27)
Bilirubin Total: 0.8 mg/dL (ref 0.0–1.2)
CO2: 20 mmol/L (ref 20–29)
Calcium: 9.4 mg/dL (ref 8.6–10.2)
Chloride: 104 mmol/L (ref 96–106)
Creatinine, Ser: 1.02 mg/dL (ref 0.76–1.27)
Globulin, Total: 3.4 g/dL (ref 1.5–4.5)
Glucose: 89 mg/dL (ref 70–99)
Potassium: 4.3 mmol/L (ref 3.5–5.2)
Sodium: 140 mmol/L (ref 134–144)
Total Protein: 7.5 g/dL (ref 6.0–8.5)
eGFR: 82 mL/min/{1.73_m2} (ref >59)

## 2023-03-18 LAB — CBC WITH DIFFERENTIAL
Basophils Absolute: 0.1 10*3/uL (ref 0.0–0.2)
Basos: 1 %
EOS (ABSOLUTE): 0.4 10*3/uL (ref 0.0–0.4)
Eos: 5 %
Hematocrit: 46.5 % (ref 37.5–51.0)
Hemoglobin: 15.7 g/dL (ref 13.0–17.7)
Immature Grans (Abs): 0 10*3/uL (ref 0.0–0.1)
Immature Granulocytes: 0 %
Lymphocytes Absolute: 1.5 10*3/uL (ref 0.7–3.1)
Lymphs: 20 %
MCH: 30.5 pg (ref 26.6–33.0)
MCHC: 33.8 g/dL (ref 31.5–35.7)
MCV: 90 fL (ref 79–97)
Monocytes Absolute: 0.9 10*3/uL (ref 0.1–0.9)
Monocytes: 12 %
Neutrophils Absolute: 4.5 10*3/uL (ref 1.4–7.0)
Neutrophils: 62 %
RBC: 5.15 x10E6/uL (ref 4.14–5.80)
RDW: 12.7 % (ref 11.6–15.4)
WBC: 7.4 10*3/uL (ref 3.4–10.8)

## 2023-03-18 LAB — VITAMIN B12: Vitamin B-12: 527 pg/mL (ref 232–1245)

## 2023-03-18 LAB — TSH: TSH: 0.505 u[IU]/mL (ref 0.450–4.500)

## 2023-03-18 LAB — LIPID PANEL
Chol/HDL Ratio: 3.7 ratio (ref 0.0–5.0)
Cholesterol, Total: 114 mg/dL (ref 100–199)
HDL: 31 mg/dL — ABNORMAL LOW (ref >39)
LDL Chol Calc (NIH): 62 mg/dL (ref 0–99)
Triglycerides: 116 mg/dL (ref 0–149)
VLDL Cholesterol Cal: 21 mg/dL (ref 5–40)

## 2023-03-18 LAB — VITAMIN D 25 HYDROXY (VIT D DEFICIENCY, FRACTURES): Vit D, 25-Hydroxy: 34.5 ng/mL (ref 30.0–100.0)

## 2023-03-20 ENCOUNTER — Other Ambulatory Visit: Payer: Self-pay | Admitting: Family

## 2023-03-20 ENCOUNTER — Other Ambulatory Visit: Payer: Self-pay | Admitting: Internal Medicine

## 2023-03-20 DIAGNOSIS — E039 Hypothyroidism, unspecified: Secondary | ICD-10-CM

## 2023-03-20 DIAGNOSIS — I10 Essential (primary) hypertension: Secondary | ICD-10-CM

## 2023-03-21 ENCOUNTER — Other Ambulatory Visit: Payer: Self-pay | Admitting: Acute Care

## 2023-03-21 DIAGNOSIS — Z122 Encounter for screening for malignant neoplasm of respiratory organs: Secondary | ICD-10-CM

## 2023-03-21 DIAGNOSIS — F17210 Nicotine dependence, cigarettes, uncomplicated: Secondary | ICD-10-CM

## 2023-03-21 DIAGNOSIS — Z87891 Personal history of nicotine dependence: Secondary | ICD-10-CM

## 2023-03-28 ENCOUNTER — Other Ambulatory Visit: Payer: Self-pay | Admitting: Family

## 2023-04-25 ENCOUNTER — Encounter: Payer: Self-pay | Admitting: Internal Medicine

## 2023-04-27 ENCOUNTER — Other Ambulatory Visit: Payer: Self-pay | Admitting: Family

## 2023-05-15 ENCOUNTER — Other Ambulatory Visit: Payer: Self-pay | Admitting: Internal Medicine

## 2023-05-15 DIAGNOSIS — E782 Mixed hyperlipidemia: Secondary | ICD-10-CM

## 2023-05-17 ENCOUNTER — Other Ambulatory Visit: Payer: Self-pay | Admitting: Internal Medicine

## 2023-05-17 DIAGNOSIS — E782 Mixed hyperlipidemia: Secondary | ICD-10-CM

## 2023-05-24 ENCOUNTER — Other Ambulatory Visit: Payer: Self-pay | Admitting: Family

## 2023-05-25 ENCOUNTER — Encounter: Payer: Self-pay | Admitting: Internal Medicine

## 2023-06-01 ENCOUNTER — Encounter: Payer: Self-pay | Admitting: Internal Medicine

## 2023-06-03 ENCOUNTER — Telehealth: Payer: Self-pay

## 2023-06-03 NOTE — Telephone Encounter (Signed)
Spoke with the patient and gave him the information about his upcoming appointment scheduled for ultrasound on 06/16/23 at 8:45, instructed the patient to arrive 15 minutes early at  location [address]. ([phone] he verbalized understanding.

## 2023-06-08 ENCOUNTER — Other Ambulatory Visit: Payer: Medicaid Other

## 2023-06-08 ENCOUNTER — Ambulatory Visit: Payer: Medicaid Other | Admitting: Internal Medicine

## 2023-06-09 ENCOUNTER — Encounter: Payer: Self-pay | Admitting: Internal Medicine

## 2023-06-13 ENCOUNTER — Telehealth: Payer: Self-pay

## 2023-06-13 ENCOUNTER — Other Ambulatory Visit: Payer: Self-pay | Admitting: Internal Medicine

## 2023-06-13 DIAGNOSIS — I10 Essential (primary) hypertension: Secondary | ICD-10-CM

## 2023-06-14 NOTE — Telephone Encounter (Signed)
Pt decided to be seen by urgent care.

## 2023-06-15 ENCOUNTER — Encounter: Payer: Self-pay | Admitting: Internal Medicine

## 2023-06-16 ENCOUNTER — Ambulatory Visit
Admission: RE | Admit: 2023-06-16 | Discharge: 2023-06-16 | Disposition: A | Discharge status: Home / Self Care | Payer: 59 | Source: Ambulatory Visit | Attending: Internal Medicine | Admitting: Internal Medicine

## 2023-06-16 ENCOUNTER — Other Ambulatory Visit: Payer: Self-pay | Admitting: Family

## 2023-06-16 DIAGNOSIS — E039 Hypothyroidism, unspecified: Secondary | ICD-10-CM

## 2023-06-16 DIAGNOSIS — R932 Abnormal findings on diagnostic imaging of liver and biliary tract: Secondary | ICD-10-CM | POA: Diagnosis not present

## 2023-06-21 ENCOUNTER — Other Ambulatory Visit: Payer: Self-pay

## 2023-06-22 ENCOUNTER — Inpatient Hospital Stay (HOSPITAL_BASED_OUTPATIENT_CLINIC_OR_DEPARTMENT_OTHER): Payer: 59 | Admitting: Internal Medicine

## 2023-06-22 ENCOUNTER — Other Ambulatory Visit: Payer: Self-pay | Admitting: Family

## 2023-06-22 ENCOUNTER — Inpatient Hospital Stay: Payer: 59 | Attending: Internal Medicine

## 2023-06-22 DIAGNOSIS — D731 Hypersplenism: Secondary | ICD-10-CM | POA: Diagnosis not present

## 2023-06-22 DIAGNOSIS — K769 Liver disease, unspecified: Secondary | ICD-10-CM | POA: Diagnosis not present

## 2023-06-22 DIAGNOSIS — D6959 Other secondary thrombocytopenia: Secondary | ICD-10-CM | POA: Insufficient documentation

## 2023-06-22 DIAGNOSIS — B1920 Unspecified viral hepatitis C without hepatic coma: Secondary | ICD-10-CM | POA: Insufficient documentation

## 2023-06-22 DIAGNOSIS — Z9189 Other specified personal risk factors, not elsewhere classified: Secondary | ICD-10-CM

## 2023-06-22 LAB — IRON AND TIBC
Iron: 121 ug/dL (ref 45–182)
Saturation Ratios: 28 % (ref 17.9–39.5)
TIBC: 426 ug/dL (ref 250–450)
UIBC: 305 ug/dL

## 2023-06-22 LAB — FERRITIN: Ferritin: 19 ng/mL — ABNORMAL LOW (ref 24–336)

## 2023-06-22 MED ORDER — ALPRAZOLAM 0.5 MG PO TABS: 0.5000 mg | ORAL_TABLET | Freq: Three times a day (TID) | ORAL | 0 refills | Status: DC | PRN

## 2023-06-22 NOTE — Assessment & Plan Note (Signed)
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  APRIL 2024- ferritin-LOW.  HOLD off phlebotomy at this time.   #Thrombocytopenia-platelets 93  [baseline 100] -secondary to hypersplenism cirrhosis-monitor closely.  Especially patient on Xarelto.  # Elevated AFP -question secondary cirrhosis; APRIL 25th, 2024- Cirrhotic liver with a 12 mm hypoechoic mass in the left hepatic lobe. While the hypoechoic mass may represent a complicated cyst, a solid mass is not completely excluded, especially given known cirrhosis.  Ordered MRI of the abdomen with and without contrast for more complete evaluation. APRIL 2024- AFP-WNL.  MRI liver with contrast- MAY 5th, 2024- 2024- Cirrhosis; limited by respiratory motion artifact throughout. Within this limitation, no hepatic masses are identified.  [place] AUG 2024- Findings consistent with cirrhosis. 1.1 x 1.1 x 1.3 cm hypoechoic area seen in the medial left lobe liver unchanged compared to prior ultrasound of February 23, 2023. This was not seen on prior MRI Mar 03, 2023- Findings consistent with cirrhosis. 1.1 x 1.1 x 1.3 cm hypoechoic area seen in the medial left lobe liver unchanged compared to prior ultrasound of February 23, 2023. This was not seen on prior MRI Mar 03, 2023.  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 Defer to Gi [Dr.Wohl]. stable  # Active smoker- trying to quit/ Recommend quitting smoking/LCSP [April 2022]- stable.   # Paroxysmal atrial fibrillation [Dr.Etang] s/p ablation September 2020 at [facility]. on BB , Xarelto-discussed watching out for bleeding while on Xarelto. stable  ? Phlebotomy  # DISPOSITION: # follow up  in 6 months- MD: cbc/cmp; AFP- iron studies; ferritin- ; US liver-  Dr.B

## 2023-06-22 NOTE — Progress Notes (Signed)
[facility] CONSULT NOTE  Patient Care Team: Miki Kins, FNP as PCP - General (Family Medicine) Debbe Odea, MD as PCP - Cardiology (Cardiology) Earna Coder, MD as Consulting Physician (Oncology)  CHIEF COMPLAINTS/PURPOSE OF CONSULTATION: Hemochromatosis.  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  Two copies of H63D were identified.  C282Y and S65C were negative;Elevated Ferritin/I sat-88%;  SEP 2021- s/p Phlebotomy x3  # Elevated AFP- 73 [SEP 2021]; NOV 2021- MRI- liver NEG;  # Cirrhosis/ HEP C [started end of sep,2021 x 12 weeks; Dr.Wohl]  # CAD- on xarelto.   HISTORY OF PRESENTING ILLNESS: Ambulating independently. With sister.   Gabriel Hoffman 67 y.o.  male history of hepatitis C/cirrhosis splenomegaly and history of hereditary hemochromatosis/elevated iron saturation/ferritin is here for follow-up/review results of the liver US.  Patient says that he is doing well, no new questions or concerns. He did have an ultra sound on 06/16/2023.   Patient complains of chronic fatigue.  Had sore feeling the mouth s/p evaluation in Urgent care. Currently resolved.   Denies any  denies any worsening abdominal pain nausea vomiting diarrhea.  Denies any leg swelling.  Chronic lower extremity cramps.  Not any worse.  Patient continues to have chronic back pain joint pains.  Joint pains back pain not any worse.   Unfortunately continues to smoke.  Review of Systems  Constitutional:  Positive for malaise/fatigue. Negative for chills, diaphoresis, fever and weight loss.  HENT:  Negative for nosebleeds and sore throat.   Eyes:  Negative for double vision.  Respiratory:  Negative for cough, hemoptysis, sputum production, shortness of breath and wheezing.   Cardiovascular:  Negative for chest pain, palpitations, orthopnea and leg  swelling.  Gastrointestinal:  Negative for abdominal pain, blood in stool, constipation, diarrhea, heartburn, melena, nausea and vomiting.  Genitourinary:  Negative for dysuria, frequency and urgency.  Musculoskeletal:  Positive for back pain, joint pain and myalgias.  Skin: Negative.  Negative for itching and rash.  Neurological:  Negative for dizziness, tingling, focal weakness, weakness and headaches.  Endo/Heme/Allergies:  Does not bruise/bleed easily.  Psychiatric/Behavioral:  Negative for depression. The patient is not nervous/anxious and does not have insomnia.     MEDICAL HISTORY:  Past Medical History:  Diagnosis Date   Anginal pain (HCC)    Anxiety    Aortic atherosclerosis (HCC)    Arthritis    Atrial fibrillation (HCC)    a.) CHA2DS2-VASc Score = 2 (HTN, vascular disease/prior MI).  b.) Rate/rhythm maintained on metoprolol succinate; chronically anticoagulated with rivaroxaban.   CAD (coronary artery disease)    Carotid artery stenosis    Cirrhosis (HCC) 06/15/2020   Complication of anesthesia    a.) seizure like activity with propofol during colonoscopy   COPD (chronic obstructive pulmonary disease) (HCC)    Current use of long term anticoagulation    a.) Rivaroxaban   Diastolic dysfunction 02/25/2020   a.) TTE 02/25/2020: normal LV function; EF 55-65%; GLS -15.8%; G1DD.   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 C282Y and S65C were negative.   Hepatitis C 06/15/2020   a.) Tx'd with Harvoni course   HLD (hyperlipidemia)    Hypertension    Hypothyroidism    Lung nodule  Myocardial infarction [facility]) 2019   x2 MI's no stents   Thrombocytopenia (HCC)     SURGICAL HISTORY: Past Surgical History:  Procedure Laterality Date   CARDIAC CATHETERIZATION     CATARACT EXTRACTION Bilateral    COLONOSCOPY     FRACTURE SURGERY Left     pins rods left wrist   HARDWARE REMOVAL Right 04/30/2021   Procedure: HARDWARE REMOVAL, Right tibia IM nail removal;  Surgeon: Kennedy Bucker, MD;  Location: [facility] ORS;  Service: Orthopedics;  Laterality: Right;   HERNIA REPAIR Bilateral    LEG SURGERY Right    rod in leg   TOTAL KNEE ARTHROPLASTY Right 10/02/2021   Procedure: TOTAL KNEE ARTHROPLASTY;  Surgeon: Kennedy Bucker, MD;  Location: [facility] ORS;  Service: Orthopedics;  Laterality: Right;   UPPER GI ENDOSCOPY      SOCIAL HISTORY: Social History   Socioeconomic History   Marital status: Single    Spouse name: Not on file   Number of children: Not on file   Years of education: Not on file   Highest education level: Not on file  Occupational History   Not on file  Tobacco Use   Smoking status: Every Day    Current packs/day: 1.00    Average packs/day: 1 pack/day for 50.0 years (50.0 ttl pk-yrs)    Types: Cigarettes   Smokeless tobacco: Never   Tobacco comments:    Smokes about 4 cigarettes daily  Vaping Use   Vaping status: Never Used  Substance and Sexual Activity   Alcohol use: Never    Comment: Quit   Drug use: Never   Sexual activity: Not on file  Other Topics Concern   Not on file  Social History Narrative   Alcohol/beer once/twice a month; 1/2 ppd; used to work in [employer]. In ; with sister.    Social Determinants of Health   Financial Resource Strain: Not on file  Food Insecurity: Not on file  Transportation Needs: Not on file  Physical Activity: Not on file  Stress: Not on file  Social Connections: Not on file  Intimate Partner Violence: Not on file    FAMILY HISTORY: Family History  Problem Relation Age of Onset   Cancer Mother    COPD Mother    Cancer Sister     ALLERGIES:  is allergic to codeine and propofol.  MEDICATIONS:  Current Outpatient Medications  Medication Sig Dispense Refill   ALPRAZolam (XANAX) 0.5 MG tablet Take 1 tablet (0.5 mg total) by mouth 3 (three) times daily  as needed for anxiety. 90 tablet 0   atorvastatin (LIPITOR) 10 MG tablet once a day 90 tablet 0   DULoxetine (CYMBALTA) 30 MG capsule Take 30 mg by mouth daily.     escitalopram (LEXAPRO) 10 MG tablet Take 1 tablet (10 mg total) by mouth daily. 90 tablet 1   levothyroxine (SYNTHROID) 125 MCG tablet TAKE 1 TABLET DAILY. 90 tablet 0   lisinopril (ZESTRIL) 10 MG tablet once a day 90 tablet 0   metoprolol succinate (TOPROL-XL) 100 MG 24 hr tablet Take 100 mg by mouth daily.     nitroGLYCERIN (NITROSTAT) 0.4 MG SL tablet Place 0.4 mg under the tongue every 5 (five) minutes x 3 doses as needed for chest pain.     oxyCODONE ER (XTAMPZA ER) 9 MG C12A Take by mouth.     rivaroxaban (XARELTO) 20 MG TABS tablet Take 1 tablet (20 mg total) by mouth in the morning. 30 tablet 6   No current  facility-administered medications for this visit.      PHYSICAL EXAMINATION:   Vitals:   06/22/23 1301  BP: 112/67  Pulse: (!) 59  Temp: (!) 97.1 F (36.2 C)  SpO2: 98%   Filed Weights   06/22/23 1301  Weight: 161 lb (73 kg)    Physical Exam HENT:     Head: Normocephalic and atraumatic.     Mouth/Throat:     Pharynx: No oropharyngeal exudate.  Eyes:     Pupils: Pupils are equal, round, and reactive to light.  Cardiovascular:     Rate and Rhythm: Normal rate and regular rhythm.  Pulmonary:     Effort: No respiratory distress.     Breath sounds: No wheezing.     Comments: Decreased air entry bilaterally.  Abdominal:     General: Bowel sounds are normal. There is no distension.     Palpations: Abdomen is soft. There is no mass.     Tenderness: There is no abdominal tenderness. There is no guarding or rebound.  Musculoskeletal:        General: No tenderness. Normal range of motion.     Cervical back: Normal range of motion and neck supple.  Skin:    General: Skin is warm.  Neurological:     Mental Status: He is alert and oriented to person, place, and time.  Psychiatric:        Mood and  Affect: Affect normal.     LABORATORY DATA:  I have reviewed the data as listed Lab Results  Component Value Date   WBC 7.4 03/17/2023   HGB 15.7 03/17/2023   HCT 46.5 03/17/2023   MCV 90 03/17/2023   PLT 93 (L) 02/23/2023   Recent Labs    08/20/22 0908 02/23/23 0914 03/17/23 0944  NA 138 139 140  K 3.8 4.3 4.3  CL 107 107 104  CO2 25 25 20   GLUCOSE 170* 99 89  BUN 19 16 20   CREATININE 1.07 0.94 1.02  CALCIUM 9.1 9.4 9.4  GFRNONAA >60 >60  --   PROT 7.9 8.0 7.5  ALBUMIN 3.7 4.0 4.1  AST 23 21 19   ALT 18 20 23   ALKPHOS 66 71 91  BILITOT 0.9 0.9 0.8     US ABDOMEN LIMITED RUQ (LIVER/GB)  Result Date: 06/16/2023 CLINICAL DATA:  Cirrhosis. EXAM: ULTRASOUND ABDOMEN LIMITED RIGHT UPPER QUADRANT COMPARISON:  February 23, 2023 FINDINGS: Gallbladder: No gallstones or wall thickening visualized. No sonographic Murphy sign noted by sonographer. Common bile duct: Diameter: 3.6 mm. Liver: 1.1 x 1.1 x 1.3 cm hypoechoic area seen in the medial left lobe liver unchanged compared to prior ultrasound of February 23, 2023. This was not seen on prior MRI Mar 03, 2023. Nodular contour with heterogeneous echotexture. Portal vein is patent on color Doppler imaging with normal direction of blood flow towards the liver. Other: None. IMPRESSION: Findings consistent with cirrhosis. 1.1 x 1.1 x 1.3 cm hypoechoic area seen in the medial left lobe liver unchanged compared to prior ultrasound of February 23, 2023. This was not seen on prior MRI Mar 03, 2023. Electronically Signed   By: Sherian Rein M.D.   On: 06/16/2023 09:10    Hemochromatosis, hereditary (HCC) # Hereditary hemochromatosis-[Elevated iron saturation-iron studies; unclear if patient has organ involvement-given the hepatitis C/cirrhosis]- s/p phelbtomy x3.  APRIL 2024- ferritin-LOW.  HOLD off phlebotomy at this time.   #Thrombocytopenia-platelets 93  [baseline 100] -secondary to hypersplenism cirrhosis-monitor closely.  Especially patient on  Xarelto.  #  Elevated AFP -question secondary cirrhosis; APRIL 25th, 2024- Cirrhotic liver with a 12 mm hypoechoic mass in the left hepatic lobe. While the hypoechoic mass may represent a complicated cyst, a solid mass is not completely excluded, especially given known cirrhosis.  Ordered MRI of the abdomen with and without contrast for more complete evaluation. APRIL 2024- AFP-WNL.  MRI liver with contrast- MAY 5th, 2024- 2024- Cirrhosis; limited by respiratory motion artifact throughout. Within this limitation, no hepatic masses are identified.  [place] AUG 2024- Findings consistent with cirrhosis. 1.1 x 1.1 x 1.3 cm hypoechoic area seen in the medial left lobe liver unchanged compared to prior ultrasound of February 23, 2023. This was not seen on prior MRI Mar 03, 2023- Findings consistent with cirrhosis. 1.1 x 1.1 x 1.3 cm hypoechoic area seen in the medial left lobe liver unchanged compared to prior ultrasound of February 23, 2023. This was not seen on prior MRI Mar 03, 2023.  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 Defer to Gi [Dr.Wohl]. stable  # Active smoker- trying to quit/ Recommend quitting smoking/LCSP [April 2022]- stable.   # Paroxysmal atrial fibrillation [Dr.Etang] s/p ablation September 2020 at [facility]. on BB , Xarelto-discussed watching out for bleeding while on Xarelto. stable  ? Phlebotomy  # DISPOSITION: # follow up  in 6 months- MD: cbc/cmp; AFP- iron studies; ferritin- ; US liver-  Dr.B   All questions were answered. The patient knows to call the clinic with any problems, questions or concerns.   Earna Coder, MD 06/22/2023 1:22 PM

## 2023-06-22 NOTE — Progress Notes (Signed)
Patient says that he is doing well, no new questions or concerns. He did have an ultra sound on 06/16/2023

## 2023-06-23 ENCOUNTER — Encounter: Payer: Self-pay | Admitting: Internal Medicine

## 2023-06-23 LAB — AFP TUMOR MARKER: AFP, Serum, Tumor Marker: 1.9 ng/mL (ref 0.0–8.4)

## 2023-06-30 ENCOUNTER — Encounter: Payer: Self-pay | Admitting: Internal Medicine

## 2023-06-30 ENCOUNTER — Ambulatory Visit (INDEPENDENT_AMBULATORY_CARE_PROVIDER_SITE_OTHER): Payer: 59 | Admitting: Family

## 2023-06-30 ENCOUNTER — Encounter: Payer: Self-pay | Admitting: Family

## 2023-06-30 VITALS — BP 130/70 | HR 75 | Ht 65.0 in | Wt 164.2 lb

## 2023-06-30 DIAGNOSIS — E782 Mixed hyperlipidemia: Secondary | ICD-10-CM

## 2023-06-30 DIAGNOSIS — Z8619 Personal history of other infectious and parasitic diseases: Secondary | ICD-10-CM

## 2023-06-30 DIAGNOSIS — E039 Hypothyroidism, unspecified: Secondary | ICD-10-CM

## 2023-06-30 DIAGNOSIS — Z0000 Encounter for general adult medical examination without abnormal findings: Secondary | ICD-10-CM

## 2023-06-30 DIAGNOSIS — R7303 Prediabetes: Secondary | ICD-10-CM

## 2023-06-30 DIAGNOSIS — I10 Essential (primary) hypertension: Secondary | ICD-10-CM | POA: Diagnosis not present

## 2023-06-30 DIAGNOSIS — E538 Deficiency of other specified B group vitamins: Secondary | ICD-10-CM | POA: Diagnosis not present

## 2023-06-30 DIAGNOSIS — E559 Vitamin D deficiency, unspecified: Secondary | ICD-10-CM | POA: Diagnosis not present

## 2023-06-30 NOTE — Progress Notes (Signed)
Annual Wellness Visit  Patient: Gabriel Hoffman, Male    DOB: July 09, 1956, 67 y.o.   MRN: [id number] Visit Date: 07/03/2023  Today's Provider: Miki Kins, FNP  Subjective:    Chief Complaint  Patient presents with   Medicare Wellness    AWV   Gabriel Hoffman is a 67 y.o. male who presents today for his Annual Wellness Visit.   Past Medical History:  Diagnosis Date   Anginal pain (HCC)    Anxiety    Aortic atherosclerosis (HCC)    Arthritis    Atrial fibrillation (HCC)    a.) CHA2DS2-VASc Score = 2 (HTN, vascular disease/prior MI).  b.) Rate/rhythm maintained on metoprolol succinate; chronically anticoagulated with rivaroxaban.   CAD (coronary artery disease)    Carotid artery stenosis    Cirrhosis (HCC) 06/15/2020   Complication of anesthesia    a.) seizure like activity with propofol during colonoscopy   COPD (chronic obstructive pulmonary disease) (HCC)    Current use of long term anticoagulation    a.) Rivaroxaban   Diastolic dysfunction 02/25/2020   a.) TTE 02/25/2020: normal LV function; EF 55-65%; GLS -15.8%; G1DD.   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 C282Y and S65C were negative.   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   Thrombocytopenia [facility])    Past Surgical History:  Procedure Laterality Date   CARDIAC CATHETERIZATION     CATARACT EXTRACTION Bilateral    COLONOSCOPY     FRACTURE SURGERY Left    pins rods left wrist   HARDWARE REMOVAL Right 04/30/2021   Procedure: HARDWARE REMOVAL, Right tibia IM nail removal;  Surgeon: Kennedy Bucker, MD;  Location: [facility] ORS;  Service: Orthopedics;  Laterality:  Right;   HERNIA REPAIR Bilateral    LEG SURGERY Right    rod in leg   TOTAL KNEE ARTHROPLASTY Right 10/02/2021   Procedure: TOTAL KNEE ARTHROPLASTY;  Surgeon: Kennedy Bucker, MD;  Location: [facility] ORS;  Service: Orthopedics;  Laterality: Right;   UPPER GI ENDOSCOPY     Family History  Problem Relation Age of Onset   Cancer Mother    COPD Mother    Cancer Sister    Social History   Socioeconomic History   Marital status: Single    Spouse name: Not on file   Number of children: Not on file   Years of education: Not on file   Highest education level: Not on file  Occupational History   Not on file  Tobacco Use   Smoking status: Every Day    Current packs/day: 1.00    Average packs/day: 1 pack/day for 50.0 years (50.0 ttl pk-yrs)    Types: Cigarettes   Smokeless tobacco: Never   Tobacco comments:    Smokes about 4 cigarettes daily  Vaping Use   Vaping status: Never Used  Substance and Sexual Activity   Alcohol use: Not Currently    Comment: Quit   Drug use: Never   Sexual activity: Not on file  Other Topics Concern   Not on file  Social History Narrative   Alcohol/beer once/twice  a month; 1/2 ppd; used to work in [employer]. In [place]; with sister.    Social Determinants of Health   Financial Resource Strain: Not on file  Food Insecurity: Not on file  Transportation Needs: Not on file  Physical Activity: Not on file  Stress: Not on file  Social Connections: Not on file  Intimate Partner Violence: Not on file    Medications: Outpatient Medications Prior to Visit  Medication Sig   ALPRAZolam (XANAX) 0.5 MG tablet Take 1 tablet (0.5 mg total) by mouth 3 (three) times daily as needed for anxiety.   atorvastatin (LIPITOR) 10 MG tablet once a day   DULoxetine (CYMBALTA) 30 MG capsule Take 30 mg by mouth daily.   escitalopram (LEXAPRO) 10 MG tablet Take 1 tablet (10 mg total) by mouth daily.   levothyroxine (SYNTHROID) 125 MCG tablet TAKE 1 TABLET DAILY.    lisinopril (ZESTRIL) 10 MG tablet once a day   metoprolol succinate (TOPROL-XL) 100 MG 24 hr tablet Take 100 mg by mouth daily.   nitroGLYCERIN (NITROSTAT) 0.4 MG SL tablet Place 0.4 mg under the tongue every 5 (five) minutes x 3 doses as needed for chest pain.   oxyCODONE ER (XTAMPZA ER) 9 MG C12A Take by mouth.   rivaroxaban (XARELTO) 20 MG TABS tablet Take 1 tablet (20 mg total) by mouth in the morning.   No facility-administered medications prior to visit.    Allergies  Allergen Reactions   Codeine Nausea And Vomiting    With fever   Propofol Other (See Comments)    Seizure like activity during colonoscopy.  Tolerated propofol infusion with no reaction at all 10/02/21.    Patient Care Team: Miki Kins, FNP as PCP - General (Family Medicine) Debbe Odea, MD as PCP - Cardiology (Cardiology) Earna Coder, MD as Consulting Physician (Oncology)  Review of Systems  All other systems reviewed and are negative.   Last CBC Lab Results  Component Value Date   WBC 7.4 03/17/2023   HGB 15.7 03/17/2023   HCT 46.5 03/17/2023   MCV 90 03/17/2023   MCH 30.5 03/17/2023   RDW 12.7 03/17/2023   PLT 93 (L) 02/23/2023   Last metabolic panel Lab Results  Component Value Date   GLUCOSE 113 (H) 06/30/2023   NA 142 06/30/2023   K 4.4 06/30/2023   CL 107 (H) 06/30/2023   CO2 20 06/30/2023   BUN 19 06/30/2023   CREATININE 0.97 06/30/2023   EGFR 87 06/30/2023   CALCIUM 9.5 06/30/2023   PHOS 3.1 05/13/2020   PROT 7.1 06/30/2023   ALBUMIN 4.0 06/30/2023   LABGLOB 3.1 06/30/2023   AGRATIO 1.2 03/17/2023   BILITOT 0.9 06/30/2023   ALKPHOS 76 06/30/2023   AST 28 06/30/2023   ALT 31 06/30/2023   ANIONGAP 7 02/23/2023   Last lipids Lab Results  Component Value Date   CHOL 105 06/30/2023   HDL 26 (L) 06/30/2023   LDLCALC 23 06/30/2023   TRIG 397 (H) 06/30/2023   CHOLHDL 4.0 06/30/2023   Last hemoglobin A1c Lab Results  Component Value Date   HGBA1C 5.4  06/30/2023   Last thyroid functions Lab Results  Component Value Date   TSH 2.480 06/30/2023   Last vitamin D Lab Results  Component Value Date   VD25OH 27.3 (L) 06/30/2023   Last vitamin B12 and Folate Lab Results  Component Value Date   VITAMINB12 620 06/30/2023   FOLATE 3.6 12/15/2021       Objective:  Vitals: BP 130/70   Pulse 75   Ht 5\' 5"  (1.651 m)   Wt 164 lb 3.2 oz (74.5 kg)   SpO2 98%   BMI 27.32 kg/m   Physical Exam Vitals and nursing note reviewed.  Constitutional:      Appearance: Normal appearance. He is normal weight.  Eyes:     Pupils: Pupils are equal, round, and reactive to light.  Cardiovascular:     Rate and Rhythm: Normal rate and regular rhythm.     Pulses: Normal pulses.     Heart sounds: Normal heart sounds.  Pulmonary:     Effort: Pulmonary effort is normal.     Breath sounds: Normal breath sounds.  Musculoskeletal:        General: Normal range of motion.  Neurological:     General: No focal deficit present.     Mental Status: He is alert and oriented to person, place, and time. Mental status is at baseline.  Psychiatric:        Mood and Affect: Mood normal.        Behavior: Behavior normal.        Thought Content: Thought content normal.        Judgment: Judgment normal.     Most recent functional status assessment:    06/30/2023    2:09 PM  In your present state of health, do you have any difficulty performing the following activities:  Hearing? 0  Vision? 0  Difficulty concentrating or making decisions? 0  Walking or climbing stairs? 0  Dressing or bathing? 0  Doing errands, shopping? 0  Preparing Food and eating ? N  Using the Toilet? N  In the past six months, have you accidently leaked urine? N  Do you have problems with loss of bowel control? N  Managing your Medications? N  Managing your Finances? N  Housekeeping or managing your Housekeeping? N    Most recent fall risk assessment:    06/30/2023    2:10 PM   Fall Risk   Falls in the past year? 0  Number falls in past yr: 0  Injury with Fall? 0  Risk for fall due to : Medication side effect     Most recent depression screenings:    06/30/2023    2:10 PM 12/14/2021    9:20 AM  PHQ 2/9 Scores  PHQ - 2 Score 0 0    Most recent cognitive screening:  Mini-Cog - 06/30/23 1432     Normal clock drawing test? yes    How many words correct? 3              Assessment & Plan:      Annual wellness visit done today including the all of the following: Reviewed patient's Family Medical History Reviewed and updated list of patient's medical providers Assessment of cognitive impairment was done Assessed patient's functional ability Established a written schedule for health screening services Health Risk Assessent Completed and Reviewed  Exercise Activities and Dietary recommendations  Goals      Activity and Exercise Increased     Evidence-based guidance:  Review current exercise levels.  Assess patient perspective on exercise or activity level, barriers to increasing activity, motivation and readiness for change.  Recommend or set healthy exercise goal based on individual tolerance.  Encourage small steps toward making change in amount of exercise or activity.  Urge reduction of sedentary activities or screen time.  Promote group activities within the community or with family or  support person.  Consider referral to rehabiliation therapist for assessment and exercise/activity plan.   Notes:      Tobacco Use Managed     Evidence-based guidance:  Identify tobacco use status including cigarette, cigar, roll-your-own, dissolvable, hookah or smokeless tobacco, nicotine gels, vapes, e-cigarettes or other electronic delivery systems.  Identify the patient's willingness to quit or cut down.   Intervene using the ?o5A's? technique:  Ask: Identify and document tobacco use status for every patient at every visit.  Assess willingness to  make a quit attempt now.  Advise: In a clear, strong and personalized manner, urge every tobacco user to quit.  Assist: For the patient willing to make a quit attempt, use counseling and pharmacologic therapy (nicotine replacement, antidepressant, nicotinic receptor partial agonist) to help quit.  Arrange: Schedule follow-up contact, in person or by telephone, preferably within the first week after the quit date.   Notes:         Immunization History  Administered Date(s) Administered   Influenza Inj Mdck Quad Pf 07/29/2020   Influenza, High Dose Seasonal PF 07/29/2020   Influenza-Unspecified 07/27/2021   Moderna Sars-Covid-2 Vaccination 06/28/2020, 07/29/2020   PNEUMOCOCCAL CONJUGATE-20 07/16/2021   Zoster Recombinant(Shingrix) 06/18/2021, 09/15/2021    Health Maintenance  Topic Date Due   HIV Screening  Never done   DTaP/Tdap/Td (1 - Tdap) Never done   COVID-19 Vaccine (3 - Moderna risk series) 08/26/2020   INFLUENZA VACCINE  09/01/2023 (Originally 06/02/2023)   Lung Cancer Screening  03/16/2024   Medicare Annual Wellness (AWV)  06/29/2024   Colonoscopy  04/11/2028   Pneumonia Vaccine 15+ Years old  Completed   Hepatitis C Screening  Completed   Zoster Vaccines- Shingrix  Completed   HPV VACCINES  Aged Out     Discussed health benefits of physical activity, and encouraged him to engage in regular exercise appropriate for his age and condition.     Problem List Items Addressed This Visit       Active Problems   Essential hypertension    Blood pressure well controlled with current medications.  Continue current therapy.  Will reassess at follow up.       Relevant Orders   CMP14+EGFR (Completed)   TSH (Completed)   CBC with Differential/Platelet   Mixed hyperlipidemia    Checking labs today.  Continue current therapy for lipid control. Will modify as needed based on labwork results.       Relevant Orders   Lipid panel (Completed)   CMP14+EGFR (Completed)    TSH (Completed)   CBC with Differential/Platelet   History of hepatitis C   Relevant Orders   CMP14+EGFR (Completed)   TSH (Completed)   CBC with Differential/Platelet   Acquired hypothyroidism    Patient stable.  Well controlled with current therapy.   Continue current meds.       Relevant Orders   CMP14+EGFR (Completed)   TSH (Completed)   CBC with Differential/Platelet   Other Visit Diagnoses     Encounter for annual wellness exam in Medicare patient    -  Primary   Relevant Orders   Lipid panel (Completed)   VITAMIN D 25 Hydroxy (Vit-D Deficiency, Fractures) (Completed)   CMP14+EGFR (Completed)   TSH (Completed)   Hemoglobin A1c (Completed)   Vitamin B12 (Completed)   CBC with Differential/Platelet   Vitamin D deficiency, unspecified       Checking labs today.  Will continue supplements as needed.   Relevant Orders   VITAMIN D 25 Hydroxy (  Vit-D Deficiency, Fractures) (Completed)   CMP14+EGFR (Completed)   TSH (Completed)   CBC with Differential/Platelet   Prediabetes       Checking labs today Patient counseled on dietary choices and verbalized understanding.   Relevant Orders   CMP14+EGFR (Completed)   TSH (Completed)   Hemoglobin A1c (Completed)   CBC with Differential/Platelet   B12 deficiency due to diet       Checking labs today.  Will continue supplements as needed.   Relevant Orders   CMP14+EGFR (Completed)   TSH (Completed)   Vitamin B12 (Completed)   CBC with Differential/Platelet           Miki Kins, FNP   06/30/2023  This document may have been prepared by [facility] Voice Recognition software and as such may include unintentional dictation errors.

## 2023-07-01 ENCOUNTER — Telehealth: Payer: Self-pay | Admitting: Family

## 2023-07-01 LAB — VITAMIN B12: Vitamin B-12: 620 pg/mL (ref 232–1245)

## 2023-07-01 LAB — CMP14+EGFR
ALT: 31 IU/L (ref 0–44)
AST: 28 IU/L (ref 0–40)
Albumin: 4 g/dL (ref 3.9–4.9)
Alkaline Phosphatase: 76 IU/L (ref 44–121)
BUN/Creatinine Ratio: 20 (ref 10–24)
BUN: 19 mg/dL (ref 8–27)
Bilirubin Total: 0.9 mg/dL (ref 0.0–1.2)
CO2: 20 mmol/L (ref 20–29)
Calcium: 9.5 mg/dL (ref 8.6–10.2)
Chloride: 107 mmol/L — ABNORMAL HIGH (ref 96–106)
Creatinine, Ser: 0.97 mg/dL (ref 0.76–1.27)
Globulin, Total: 3.1 g/dL (ref 1.5–4.5)
Glucose: 113 mg/dL — ABNORMAL HIGH (ref 70–99)
Potassium: 4.4 mmol/L (ref 3.5–5.2)
Sodium: 142 mmol/L (ref 134–144)
Total Protein: 7.1 g/dL (ref 6.0–8.5)
eGFR: 87 mL/min/{1.73_m2} (ref >59)

## 2023-07-01 LAB — LIPID PANEL
Chol/HDL Ratio: 4 ratio (ref 0.0–5.0)
Cholesterol, Total: 105 mg/dL (ref 100–199)
HDL: 26 mg/dL — ABNORMAL LOW (ref >39)
LDL Chol Calc (NIH): 23 mg/dL (ref 0–99)
Triglycerides: 397 mg/dL — ABNORMAL HIGH (ref 0–149)
VLDL Cholesterol Cal: 56 mg/dL — ABNORMAL HIGH (ref 5–40)

## 2023-07-01 LAB — TSH: TSH: 2.48 u[IU]/mL (ref 0.450–4.500)

## 2023-07-01 LAB — VITAMIN D 25 HYDROXY (VIT D DEFICIENCY, FRACTURES): Vit D, 25-Hydroxy: 27.3 ng/mL — ABNORMAL LOW (ref 30.0–100.0)

## 2023-07-01 LAB — HEMOGLOBIN A1C
Est. average glucose Bld gHb Est-mCnc: 108 mg/dL
Hgb A1c MFr Bld: 5.4 % (ref 4.8–5.6)

## 2023-07-01 NOTE — Telephone Encounter (Signed)
Pt was returning a call for lab results

## 2023-07-03 ENCOUNTER — Encounter: Payer: Self-pay | Admitting: Family

## 2023-07-03 NOTE — Assessment & Plan Note (Signed)
Blood pressure well controlled with current medications.  Continue current therapy.  Will reassess at follow up.  

## 2023-07-03 NOTE — Assessment & Plan Note (Signed)
Checking labs today.  Continue current therapy for lipid control. Will modify as needed based on labwork results.  

## 2023-07-03 NOTE — Assessment & Plan Note (Signed)
Patient stable.  Well controlled with current therapy.   Continue current meds.  

## 2023-07-05 NOTE — Telephone Encounter (Signed)
Spoke with pt who verbalized understanding.

## 2023-07-15 ENCOUNTER — Telehealth: Payer: Self-pay

## 2023-07-18 ENCOUNTER — Ambulatory Visit: Payer: 59 | Admitting: Family

## 2023-07-19 ENCOUNTER — Other Ambulatory Visit: Payer: Self-pay | Admitting: Family

## 2023-07-19 MED ORDER — XTAMPZA ER 9 MG PO C12A: 9.0000 mg | EXTENDED_RELEASE_CAPSULE | Freq: Every day | ORAL | 0 refills | Status: DC

## 2023-07-27 NOTE — Telephone Encounter (Signed)
Rx refilled.

## 2023-08-12 ENCOUNTER — Other Ambulatory Visit: Payer: Self-pay | Admitting: Internal Medicine

## 2023-08-12 DIAGNOSIS — E782 Mixed hyperlipidemia: Secondary | ICD-10-CM

## 2023-08-15 ENCOUNTER — Other Ambulatory Visit: Payer: Self-pay | Admitting: Family

## 2023-08-16 ENCOUNTER — Telehealth: Payer: Self-pay | Admitting: Family

## 2023-08-16 NOTE — Telephone Encounter (Signed)
Patient left VM that he needs two refills but did not state which meds he needs.

## 2023-08-16 NOTE — Telephone Encounter (Signed)
They are in the inbox and will get sent

## 2023-08-17 ENCOUNTER — Other Ambulatory Visit: Payer: Self-pay

## 2023-09-08 ENCOUNTER — Other Ambulatory Visit: Payer: Self-pay | Admitting: Internal Medicine

## 2023-09-08 DIAGNOSIS — I10 Essential (primary) hypertension: Secondary | ICD-10-CM

## 2023-09-11 ENCOUNTER — Other Ambulatory Visit: Payer: Self-pay | Admitting: Family

## 2023-09-11 DIAGNOSIS — E039 Hypothyroidism, unspecified: Secondary | ICD-10-CM

## 2023-09-13 ENCOUNTER — Other Ambulatory Visit: Payer: Self-pay | Admitting: Family

## 2023-09-14 ENCOUNTER — Other Ambulatory Visit: Payer: Self-pay | Admitting: Family

## 2023-09-15 ENCOUNTER — Other Ambulatory Visit: Payer: Self-pay

## 2023-09-16 ENCOUNTER — Encounter: Payer: Self-pay | Admitting: Internal Medicine

## 2023-09-20 ENCOUNTER — Encounter: Payer: Self-pay | Admitting: Internal Medicine

## 2023-09-22 ENCOUNTER — Ambulatory Visit: Payer: 59 | Attending: Medical | Admitting: Medical

## 2023-09-22 ENCOUNTER — Encounter: Payer: Self-pay | Admitting: Medical

## 2023-09-22 ENCOUNTER — Ambulatory Visit: Payer: 59

## 2023-09-22 VITALS — BP 158/70 | HR 79 | Ht 65.0 in | Wt 170.6 lb

## 2023-09-22 DIAGNOSIS — I10 Essential (primary) hypertension: Secondary | ICD-10-CM | POA: Diagnosis not present

## 2023-09-22 DIAGNOSIS — I6523 Occlusion and stenosis of bilateral carotid arteries: Secondary | ICD-10-CM | POA: Diagnosis not present

## 2023-09-22 DIAGNOSIS — I480 Paroxysmal atrial fibrillation: Secondary | ICD-10-CM

## 2023-09-22 DIAGNOSIS — R0609 Other forms of dyspnea: Secondary | ICD-10-CM

## 2023-09-22 DIAGNOSIS — I2510 Atherosclerotic heart disease of native coronary artery without angina pectoris: Secondary | ICD-10-CM

## 2023-09-22 MED ORDER — METOPROLOL TARTRATE 50 MG PO TABS: ORAL_TABLET | ORAL | 0 refills | Status: DC

## 2023-09-22 MED ORDER — METOPROLOL SUCCINATE ER 100 MG PO TB24: 100.0000 mg | ORAL_TABLET | Freq: Every day | ORAL | 3 refills | Status: DC

## 2023-09-22 MED ORDER — NITROGLYCERIN 0.4 MG SL SUBL: 0.4000 mg | SUBLINGUAL_TABLET | SUBLINGUAL | 2 refills | Status: AC | PRN

## 2023-09-22 MED ORDER — RIVAROXABAN 20 MG PO TABS: 20.0000 mg | ORAL_TABLET | Freq: Every morning | ORAL | 3 refills | Status: DC

## 2023-09-22 NOTE — Progress Notes (Signed)
Cardiology Office Note:    Date:  09/22/2023   ID:  Gabriel Hoffman, DOB [birth year], MRN [id number]  PCP:  Miki Kins, FNP  [facility] Cardiologist:  Debbe Odea, MD  [facility] Electrophysiologist:  None    Referring MD: Miki Kins, FNP   Chief Complaint: 1 year follow-up  History of Present Illness:    Gabriel Hoffman is a 67 y.o. male with a hx of hypertension, hyperlipidemia, atrial fibrillation status post ablation in September 2020, ?  MI in February 2020, current smoker x 50 years who presents for follow-up.  Patient reported having heart attack in February 2020 while he was incarcerated.  He was taken to [facility] in [facility].  He said he was here from week, no procedures were performed and he was placed on medications.  In September he reported being taken to [facility] where he was diagnosed with atrial fibrillation and started on Xarelto.  Echocardiogram in April 2021 showed normal LVSF, EF 55 to 60%, impaired relaxation, normal LA size.  Cardiac monitor November 2022 showed paroxysmal A-fib with percent burden.  Chest CT lungs cancer screening in 2023 showed three-vessel CAD.  Patient was last seen in September 2023 reporting intermittent chest pain.  Myoview Lexiscan was ordered.  This was low risk with no evidence of ischemia, LAD and RCA calcifications noted, LVEF 69%.  Today, patient reports worsening dyspnea on exertion and fatigue for the last 2 months.  He also reports palpitations.  Patient is still smoking.  He denies any chest pain, lightheadedness, dizziness, orthopnea, PND, lower leg edema.   Past Medical History:  Diagnosis Date   Anginal pain (HCC)    Anxiety    Aortic atherosclerosis (HCC)    Arthritis    Atrial fibrillation (HCC)    a.) CHA2DS2-VASc Score = 2 (HTN, vascular disease/prior MI).  b.) Rate/rhythm maintained on metoprolol succinate; chronically anticoagulated with  rivaroxaban.   CAD (coronary artery disease)    Carotid artery stenosis    Cirrhosis (HCC) 06/15/2020   Complication of anesthesia    a.) seizure like activity with propofol during colonoscopy   COPD (chronic obstructive pulmonary disease) (HCC)    Current use of long term anticoagulation    a.) Rivaroxaban   Diastolic dysfunction 02/25/2020   a.) TTE 02/25/2020: normal LV function; EF 55-65%; GLS -15.8%; G1DD.   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 C282Y and S65C were negative.   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   Thrombocytopenia [facility])     Past Surgical History:  Procedure Laterality Date   CARDIAC CATHETERIZATION     CATARACT EXTRACTION Bilateral    COLONOSCOPY     FRACTURE SURGERY Left    pins rods left wrist   HARDWARE REMOVAL Right 04/30/2021   Procedure: HARDWARE REMOVAL, Right tibia IM nail removal;  Surgeon: Kennedy Bucker, MD;  Location: [facility] ORS;  Service: Orthopedics;  Laterality: Right;   HERNIA REPAIR Bilateral    LEG SURGERY Right    rod in leg   TOTAL KNEE ARTHROPLASTY Right 10/02/2021  Procedure: TOTAL KNEE ARTHROPLASTY;  Surgeon: Kennedy Bucker, MD;  Location: [facility] ORS;  Service: Orthopedics;  Laterality: Right;   UPPER GI ENDOSCOPY      Current Medications: Current Meds  Medication Sig   ALPRAZolam (XANAX) 0.5 MG tablet Take 1 tablet (0.5 mg total) by mouth 3 (three) times daily as needed for anxiety.   atorvastatin (LIPITOR) 10 MG tablet once a day   DULoxetine (CYMBALTA) 30 MG capsule Take 30 mg by mouth daily.   escitalopram (LEXAPRO) 10 MG tablet Take 1 tablet (10 mg total) by mouth daily.    levothyroxine (SYNTHROID) 125 MCG tablet TAKE 1 TABLET DAILY.   lisinopril (ZESTRIL) 10 MG tablet once a day   metoprolol tartrate (LOPRESSOR) 50 MG tablet TAKE 1 TABLET 90 MIN PRIOR TO CARDIAC PROCEDURE.   XTAMPZA ER 9 MG C12A Take 9 mg by mouth daily.   [DISCONTINUED] metoprolol succinate (TOPROL-XL) 100 MG 24 hr tablet Take 100 mg by mouth daily.   [DISCONTINUED] nitroGLYCERIN (NITROSTAT) 0.4 MG SL tablet Place 0.4 mg under the tongue every 5 (five) minutes x 3 doses as needed for chest pain.   [DISCONTINUED] rivaroxaban (XARELTO) 20 MG TABS tablet Take 1 tablet (20 mg total) by mouth in the morning.     Allergies:   Codeine and Propofol   Social History   Socioeconomic History   Marital status: Single    Spouse name: Not on file   Number of children: Not on file   Years of education: Not on file   Highest education level: Not on file  Occupational History   Not on file  Tobacco Use   Smoking status: Every Day    Current packs/day: 1.00    Average packs/day: 1 pack/day for 50.0 years (50.0 ttl pk-yrs)    Types: Cigarettes   Smokeless tobacco: Never   Tobacco comments:    Smokes about 4 cigarettes daily  Vaping Use   Vaping status: Never Used  Substance and Sexual Activity   Alcohol use: Not Currently    Comment: Quit   Drug use: Never   Sexual activity: Not on file  Other Topics Concern   Not on file  Social History Narrative   Alcohol/beer once/twice a month; 1/2 ppd; used to work in [employer]. In ; with sister.    Social Determinants of Health   Financial Resource Strain: Not on file  Food Insecurity: Not on file  Transportation Needs: Not on file  Physical Activity: Not on file  Stress: Not on file  Social Connections: Not on file     Family History: The patient's family history includes COPD in his mother; Cancer in his mother and sister.  ROS:   Please see the history of present illness.     All other systems reviewed and are  negative.  EKGs/Labs/Other Studies Reviewed:    The following studies were reviewed today:  Myoview lexiscan 07/2022   The study is low risk.   No ST deviation was noted.   LV perfusion is normal with no evidence for ischemia.   Left ventricular function is normal. calculated LVEF = 69%   LAD and RCA calcifications noted.  Heart monitor 04/2022 Patient had a minimum heart rate of 50, maximum heart rate 188, average heart rate 82. Predominant underlying rhythm was sinus. Frequent paroxysmal SVTs noted. Rare episodes of atrial fibrillation alcoholic, less than 1% burden. Occasional PVCs were noted, 1.9%.   Continue medications as prescribed.   Echo 2021 1. Left ventricular ejection  fraction, by estimation, is 55 to 60%. The  left ventricle has normal function. The left ventricle has no regional  wall motion abnormalities. Left ventricular diastolic parameters are  consistent with Grade I diastolic  dysfunction (impaired relaxation). The average left ventricular global  longitudinal strain is -15.8 %.   2. Right ventricular systolic function is normal. The right ventricular  size is normal. There is normal pulmonary artery systolic pressure.   3. The mitral valve is myxomatous. Mild mitral valve regurgitation. No  evidence of mitral stenosis.   4. The aortic valve is tricuspid. Aortic valve regurgitation is not  visualized. No aortic stenosis is present.   EKG:  EKG is  ordered today.  The ekg ordered today demonstrates normal sinus rhythm with PACs, 79 bpm, no significant ST-T wave changes  Recent Labs: 02/23/2023: Platelets 93 03/17/2023: Hemoglobin 15.7 06/30/2023: ALT 31; BUN 19; Creatinine, Ser 0.97; Potassium 4.4; Sodium 142; TSH 2.480  Recent Lipid Panel    Component Value Date/Time   CHOL 105 06/30/2023 1419   TRIG 397 (H) 06/30/2023 1419   HDL 26 (L) 06/30/2023 1419   CHOLHDL 4.0 06/30/2023 1419   LDLCALC 23 06/30/2023 1419      Physical Exam:    VS:  BP (!)  158/70 (BP Location: Left Arm, Patient Position: Sitting, Cuff Size: Normal)   Pulse 79   Ht 5\' 5"  (1.651 m)   Wt 170 lb 9.6 oz (77.4 kg)   SpO2 95%   BMI 28.39 kg/m     Wt Readings from Last 3 Encounters:  09/22/23 170 lb 9.6 oz (77.4 kg)  06/30/23 164 lb 3.2 oz (74.5 kg)  06/22/23 161 lb (73 kg)     GEN:  Well nourished, well developed in no acute distress HEENT: Normal NECK: No JVD; No carotid bruits LYMPHATICS: No lymphadenopathy CARDIAC: RRR, no murmurs, rubs, gallops RESPIRATORY: Decreased lung sounds ABDOMEN: Soft, non-tender, non-distended MUSCULOSKELETAL:  No edema; No deformity  SKIN: Warm and dry NEUROLOGIC:  Alert and oriented x 3 PSYCHIATRIC:  Normal affect   ASSESSMENT:    1. Dyspnea on exertion   2. Coronary artery disease involving native coronary artery of native heart, unspecified whether angina present   3. Paroxysmal A-fib (HCC)   4. Bilateral carotid artery stenosis   5. Essential hypertension    PLAN:    In order of problems listed above:  Dyspnea on exertion Patient reports worsening dyspnea on exertion over the last 2 months.  Suspect this is multifactorial given smoking history and CAD history.  I will order an echocardiogram and a cardiac CTA.  I will update blood work with BMET, CBC, TSH, mag.  I will refer to pulmonology for smoking history/suspected COPD/  CAD Patient reported history of heart attack in 2020 treated medically.  He denies any chest pain, but reports worsening dyspnea on exertion as above. MPU in 2023 was low risk with no ischemia and normal LVEF. I will order cardiac CTA.  I will refill sublingual nitro.  No aspirin with Xarelto.  Continue Toprol, Lipitor, and lisinopril.  Palpitations H/o Afib s/p ablation Patient reports intermittent palpitations. He has h/o afib with ablation at outside hospital.  EKG today shows normal sinus rhythm with PACs.  Labs as above.  I will check a 2-week heart monitor.  I will increase Toprol to  100 mg twice daily. Continue Xareltor for stroke ppx.   Bilateral carotid artery stenosis This is followed by vascular surgery.  Most recent carotid ultrasound showed  1 to 39% stenosis bilaterally.  Hypertension Blood pressure elevated today, I will increase Toprol as above.  Continue lisinopril 10 mg daily.  Disposition: Follow up in 2 month(s) with MD/APP    Signed, Cinda Hara David Stall, PA-C  09/22/2023 9:31 AM    [facility]

## 2023-09-22 NOTE — Patient Instructions (Signed)
Medication Instructions:  Your physician recommends the following medication changes.  INCREASE: Toprol 100 mg twice daily  *If you need a refill on your cardiac medications before your next appointment, please call your pharmacy*  Lab Work: Your provider would like for you to have following labs drawn today CBC, BMP, TSH, and Mag.   If you have labs (blood work) drawn today and your tests are completely normal, you will receive your results only by: MyChart Message (if you have MyChart) OR A paper copy in the mail If you have any lab test that is abnormal or we need to change your treatment, we will call you to review the results.  Testing/Procedures: Your physician has requested that you have an echocardiogram. Echocardiography is a painless test that uses sound waves to create images of your heart. It provides your doctor with information about the size and shape of your heart and how well your heart's chambers and valves are working.   You may receive an ultrasound enhancing agent through an IV if needed to better visualize your heart during the echo. This procedure takes approximately one hour.  There are no restrictions for this procedure.  This will take place at [address] [facility], [place] [address]  Please note: We ask at that you not bring children with you during ultrasound (echo/ vascular) testing. Due to room size and safety concerns, children are not allowed in the ultrasound rooms during exams. Our front office staff cannot provide observation of children in our lobby area while testing is being conducted. An adult accompanying a patient to their appointment will only be allowed in the ultrasound room at the discretion of the ultrasound technician under special circumstances. We apologize for any inconvenience.     Your cardiac CT will be scheduled at one of the below locations:   [facility] [address] [place], [place] [address] [phone]  OR   [facility] [address] [place], [place] [address] [phone]  If scheduled at [facility] or [facility], please arrive 15 mins early for check-in and test prep.  There is spacious parking and easy access to the radiology department from the [facility] entrance. Please enter here and check-in with the desk attendant.   Please follow these instructions carefully (unless otherwise directed):  An IV will be required for this test and Nitroglycerin will be given.  Hold all erectile dysfunction medications at least 3 days (72 hrs) prior to test. (Ie viagra, cialis, sildenafil, tadalafil, etc)   On the Night Before the Test: Be sure to Drink plenty of water. Do not consume any caffeinated/decaffeinated beverages or chocolate 12 hours prior to your test. Do not take any antihistamines 12 hours prior to your test.  On the Day of the Test: Drink plenty of water until 1 hour prior to the test. Do not eat any food 1 hour prior to test. You may take your regular medications prior to the test.  Take 1 metoprolol (Lopressor) 50 mg tablet 90  prior to test. If you take Furosemide/Hydrochlorothiazide/Spironolactone, please HOLD on the morning of the test.  After the Test: Drink plenty of water. After receiving IV contrast, you may experience a mild flushed feeling. This is normal. On occasion, you may experience a mild rash up to 24 hours after the test. This is not dangerous. If this occurs, you can take Benadryl 25  mg and increase your fluid intake. If you experience trouble breathing, this can be serious. If it is severe call 911 IMMEDIATELY. If it is mild, please call our office. If you take any of these medications: Glipizide/Metformin, Avandament, Glucavance, please do not take 48 hours after completing test unless otherwise instructed.  We  will call to schedule your test 2-4 weeks out understanding that some insurance companies will need an authorization prior to the service being performed.   For more information and frequently asked questions, please visit our website : [email or web address]  For non-scheduling related questions, please contact the cardiac imaging nurse navigator should you have any questions/concerns: Cardiac Imaging Nurse Navigators Direct Office Dial: [phone]   For scheduling needs, including cancellations and rescheduling, please call [place], [phone].  ZIO XT- Long Term Monitor Instructions  Your physician has requested you wear a ZIO patch monitor for 14 days.  This is a single patch monitor. [employer] supplies one patch monitor per enrollment. Additional stickers are not available. Please do not apply patch if you will be having a Nuclear Stress Test,  Echocardiogram, Cardiac CT, MRI, or Chest Xray during the period you would be wearing the  monitor. The patch cannot be worn during these tests. You cannot remove and re-apply the  ZIO XT patch monitor.  Your ZIO patch monitor will be mailed 3 day USPS to your address on file. It may take 3-5 days  to receive your monitor after you have been enrolled.  Once you have received your monitor, please review the enclosed instructions. Your monitor  has already been registered assigning a specific monitor serial # to you.  Billing and Patient Assistance Program Information  We have supplied [employer] with any of your insurance information on file for billing purposes. [employer] offers a sliding scale Patient Assistance Program for patients that do not have  insurance, or whose insurance does not completely cover the cost of the ZIO monitor.  You must apply for the Patient Assistance Program to qualify for this discounted rate.  To apply, please call [employer] at [phone], select option 4, select option 2, ask to apply for  Patient  Assistance Program. Meredeth Ide will ask your household income, and how many people  are in your household. They will quote your out-of-pocket cost based on that information.  [employer] will also be able to set up a 81-month, interest-free payment plan if needed.  Applying the monitor   Shave hair from upper left chest.  Hold abrader disc by orange tab. Rub abrader in 40 strokes over the upper left chest as  indicated in your monitor instructions.  Clean area with 4 enclosed alcohol pads. Let dry.  Apply patch as indicated in monitor instructions. Patch will be placed under collarbone on left  side of chest with arrow pointing upward.  Rub patch adhesive wings for 2 minutes. Remove white label marked "1". Remove the white  label marked "2". Rub patch adhesive wings for 2 additional minutes.  While looking in a mirror, press and release button in center of patch. A small green light will  flash 3-4 times. This will be your only indicator that the monitor has been turned on.  Do not shower for the first 24 hours. You may shower after the first 24 hours.  Press the button if you feel a symptom. You will hear a small click. Record Date, Time and  Symptom in the Patient Logbook.  When you are ready to remove the patch, follow instructions on  the last 2 pages of Patient  Logbook. Stick patch monitor onto the last page of Patient Logbook.  Place Patient Logbook in the blue and white box. Use locking tab on box and tape box closed  securely. The blue and white box has prepaid postage on it. Please place it in the mailbox as  soon as possible. Your physician should have your test results approximately 7 days after the  monitor has been mailed back to [facility].  Call [facility] Customer Care at [phone] if you have questions regarding  your ZIO XT patch monitor. Call them immediately if you see an orange light blinking on your  monitor.  If your monitor falls off in less than 4 days,  contact our Monitor department at [phone].  If your monitor becomes loose or falls off after 4 days call [employer] at [phone] for  suggestions on securing your monitor   Follow-Up: At [facility], you and your health needs are our priority.  As part of our continuing mission to provide you with exceptional heart care, we have created designated Provider Care Teams.  These Care Teams include your primary Cardiologist (physician) and Advanced Practice Providers (APPs -  Physician Assistants and Nurse Practitioners) who all work together to provide you with the care you need, when you need it.  We recommend signing up for the patient portal called "MyChart".  Sign up information is provided on this After Visit Summary.  MyChart is used to connect with patients for Virtual Visits (Telemedicine).  Patients are able to view lab/test results, encounter notes, upcoming appointments, etc.  Non-urgent messages can be sent to your provider as well.   To learn more about what you can do with MyChart, go to [email or web address].    Your next appointment:   2 month(s)  Provider:   You may see Debbe Odea, MD or one of the following Advanced Practice Providers on your designated Care Team:   Cadence Lostine, [place]

## 2023-09-23 LAB — CBC
Hematocrit: 42.8 % (ref 37.5–51.0)
Hemoglobin: 14.4 g/dL (ref 13.0–17.7)
MCH: 31.4 pg (ref 26.6–33.0)
MCHC: 33.6 g/dL (ref 31.5–35.7)
MCV: 93 fL (ref 79–97)
Platelets: 102 10*3/uL — ABNORMAL LOW (ref 150–450)
RBC: 4.59 x10E6/uL (ref 4.14–5.80)
RDW: 12.5 % (ref 11.6–15.4)
WBC: 6.8 10*3/uL (ref 3.4–10.8)

## 2023-09-23 LAB — BASIC METABOLIC PANEL
BUN/Creatinine Ratio: 16 (ref 10–24)
BUN: 16 mg/dL (ref 8–27)
CO2: 23 mmol/L (ref 20–29)
Calcium: 9.8 mg/dL (ref 8.6–10.2)
Chloride: 103 mmol/L (ref 96–106)
Creatinine, Ser: 1.01 mg/dL (ref 0.76–1.27)
Glucose: 103 mg/dL — ABNORMAL HIGH (ref 70–99)
Potassium: 4.4 mmol/L (ref 3.5–5.2)
Sodium: 137 mmol/L (ref 134–144)
eGFR: 83 mL/min/{1.73_m2} (ref >59)

## 2023-09-23 LAB — TSH: TSH: 1.51 u[IU]/mL (ref 0.450–4.500)

## 2023-09-23 LAB — MAGNESIUM: Magnesium: 2 mg/dL (ref 1.6–2.3)

## 2023-09-24 ENCOUNTER — Other Ambulatory Visit: Payer: Self-pay | Admitting: Medical

## 2023-09-27 ENCOUNTER — Encounter: Payer: Self-pay | Admitting: Family

## 2023-09-27 ENCOUNTER — Ambulatory Visit (INDEPENDENT_AMBULATORY_CARE_PROVIDER_SITE_OTHER): Payer: 59 | Admitting: Family

## 2023-09-27 VITALS — BP 120/82 | HR 71 | Ht 65.0 in | Wt 171.8 lb

## 2023-09-27 DIAGNOSIS — E039 Hypothyroidism, unspecified: Secondary | ICD-10-CM | POA: Diagnosis not present

## 2023-09-27 DIAGNOSIS — K732 Chronic active hepatitis, not elsewhere classified: Secondary | ICD-10-CM | POA: Diagnosis not present

## 2023-09-27 DIAGNOSIS — R051 Acute cough: Secondary | ICD-10-CM

## 2023-09-27 DIAGNOSIS — I10 Essential (primary) hypertension: Secondary | ICD-10-CM

## 2023-09-27 DIAGNOSIS — F411 Generalized anxiety disorder: Secondary | ICD-10-CM

## 2023-09-27 DIAGNOSIS — E559 Vitamin D deficiency, unspecified: Secondary | ICD-10-CM | POA: Diagnosis not present

## 2023-09-27 DIAGNOSIS — E538 Deficiency of other specified B group vitamins: Secondary | ICD-10-CM | POA: Diagnosis not present

## 2023-09-27 DIAGNOSIS — R7303 Prediabetes: Secondary | ICD-10-CM

## 2023-09-27 DIAGNOSIS — E782 Mixed hyperlipidemia: Secondary | ICD-10-CM

## 2023-09-27 MED ORDER — BENZONATATE 100 MG PO CAPS: 100.0000 mg | ORAL_CAPSULE | Freq: Three times a day (TID) | ORAL | 0 refills | Status: DC | PRN

## 2023-09-27 MED ORDER — ALPRAZOLAM 0.5 MG PO TABS: 0.5000 mg | ORAL_TABLET | Freq: Three times a day (TID) | ORAL | 3 refills | Status: DC | PRN

## 2023-09-27 MED ORDER — LISINOPRIL 10 MG PO TABS: 10.0000 mg | ORAL_TABLET | Freq: Every day | ORAL | 3 refills | Status: DC

## 2023-09-27 MED ORDER — ATORVASTATIN CALCIUM 10 MG PO TABS: 10.0000 mg | ORAL_TABLET | Freq: Every day | ORAL | 3 refills | Status: DC

## 2023-09-27 NOTE — Progress Notes (Signed)
Established Patient Office Visit  Subjective:  Patient ID: Gabriel Hoffman, male    DOB: 09/18/1956  Age: 67 y.o. MRN: [id number]  Chief Complaint  Patient presents with   Follow-up    3 month    Patient is here today for his 3 months follow up.  He has been feeling fairly well since last appointment.   He does have additional concerns to discuss today.  He has has a cough for about a week says that it has been  Labs are due today. He needs refills.   I have reviewed his active problem list, medication list, allergies, health maintenance, notes from last encounter, lab results for his appointment today.      No other concerns at this time.   Past Medical History:  Diagnosis Date   Anginal pain (HCC)    Anxiety    Aortic atherosclerosis (HCC)    Arthritis    Atrial fibrillation (HCC)    a.) CHA2DS2-VASc Score = 2 (HTN, vascular disease/prior MI).  b.) Rate/rhythm maintained on metoprolol succinate; chronically anticoagulated with rivaroxaban.   CAD (coronary artery disease)    Carotid artery stenosis    Cirrhosis (HCC) 06/15/2020   Complication of anesthesia    a.) seizure like activity with propofol during colonoscopy   COPD (chronic obstructive pulmonary disease) (HCC)    Current use of long term anticoagulation    a.) Rivaroxaban   Diastolic dysfunction 02/25/2020   a.) TTE 02/25/2020: normal LV function; EF 55-65%; GLS -15.8%; G1DD.   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 C282Y and S65C were negative.   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   Thrombocytopenia [facility])     Past Surgical History:  Procedure  Laterality Date   CARDIAC CATHETERIZATION     CATARACT EXTRACTION Bilateral    COLONOSCOPY     FRACTURE SURGERY Left    pins rods left wrist   HARDWARE REMOVAL Right 04/30/2021   Procedure: HARDWARE REMOVAL, Right tibia IM nail removal;  Surgeon: Kennedy Bucker, MD;  Location: [facility] ORS;  Service: Orthopedics;  Laterality: Right;   HERNIA REPAIR Bilateral    LEG SURGERY Right    rod in leg   TOTAL KNEE ARTHROPLASTY Right 10/02/2021   Procedure: TOTAL KNEE ARTHROPLASTY;  Surgeon: Kennedy Bucker, MD;  Location: [facility] ORS;  Service: Orthopedics;  Laterality: Right;   UPPER GI ENDOSCOPY      Social History   Socioeconomic History   Marital status: Single    Spouse name: Not on file   Number of children: Not on file   Years of education: Not on file   Highest education level: Not on file  Occupational History   Not on file  Tobacco Use   Smoking status: Every Day    Current packs/day: 1.00    Average packs/day: 1 pack/day for 50.0 years (50.0 ttl pk-yrs)    Types: Cigarettes   Smokeless tobacco: Never   Tobacco comments:    Smokes about 4 cigarettes daily  Vaping Use   Vaping status: Never Used  Substance and Sexual Activity  Alcohol use: Not Currently    Comment: Quit   Drug use: Never   Sexual activity: Not on file  Other Topics Concern   Not on file  Social History Narrative   Alcohol/beer once/twice a month; 1/2 ppd; used to work in [employer]. In [place]; with sister.    Social Determinants of Health   Financial Resource Strain: Not on file  Food Insecurity: Not on file  Transportation Needs: Not on file  Physical Activity: Not on file  Stress: Not on file  Social Connections: Not on file  Intimate Partner Violence: Not on file    Family History  Problem Relation Age of Onset   Cancer Mother    COPD Mother    Cancer Sister     Allergies  Allergen Reactions   Codeine Nausea And Vomiting    With fever   Propofol Other (See Comments)    Seizure like  activity during colonoscopy.  Tolerated propofol infusion with no reaction at all 10/02/21.    Review of Systems  Respiratory:  Positive for cough.   All other systems reviewed and are negative.      Objective:   BP 120/82   Pulse 71   Ht 5\' 5"  (1.651 m)   Wt 171 lb 12.8 oz (77.9 kg)   SpO2 94%   BMI 28.59 kg/m   Vitals:   09/27/23 1311  BP: 120/82  Pulse: 71  Height: 5\' 5"  (1.651 m)  Weight: 171 lb 12.8 oz (77.9 kg)  SpO2: 94%  BMI (Calculated): 28.59    Physical Exam Vitals and nursing note reviewed.  Constitutional:      Appearance: Normal appearance. He is normal weight.  Eyes:     Conjunctiva/sclera: Conjunctivae normal.     Pupils: Pupils are equal, round, and reactive to light.  Cardiovascular:     Rate and Rhythm: Normal rate and regular rhythm.     Pulses: Normal pulses.     Heart sounds: Normal heart sounds.  Pulmonary:     Effort: Pulmonary effort is normal.     Breath sounds: Normal breath sounds.  Neurological:     General: No focal deficit present.     Mental Status: He is alert and oriented to person, place, and time. Mental status is at baseline.  Psychiatric:        Mood and Affect: Mood normal.        Behavior: Behavior normal.        Thought Content: Thought content normal.        Judgment: Judgment normal.      No results found for any visits on 09/27/23.  Recent Results (from the past 2160 hour(s))  CBC     Status: Abnormal   Collection Time: 09/22/23  9:05 AM  Result Value Ref Range   WBC 6.8 3.4 - 10.8 x10E3/uL    Comment: **Effective October 03, 2023 profile 161096 WBC will be made**   non-orderable as a stand-alone order code.    RBC 4.59 4.14 - 5.80 x10E6/uL   Hemoglobin 14.4 13.0 - 17.7 g/dL   Hematocrit 04.5 40.9 - 51.0 %   MCV 93 79 - 97 fL   MCH 31.4 26.6 - 33.0 pg   MCHC 33.6 31.5 - 35.7 g/dL   RDW 81.1 91.4 - 78.2 %   Platelets 102 (L) 150 - 450 x10E3/uL  Basic metabolic panel     Status: Abnormal   Collection  Time: 09/22/23  9:05 AM  Result Value Ref  Range   Glucose 103 (H) 70 - 99 mg/dL   BUN 16 8 - 27 mg/dL   Creatinine, Ser 1.61 0.76 - 1.27 mg/dL   eGFR 83 >09 UE/AVW/0.98   BUN/Creatinine Ratio 16 10 - 24   Sodium 137 134 - 144 mmol/L   Potassium 4.4 3.5 - 5.2 mmol/L   Chloride 103 96 - 106 mmol/L   CO2 23 20 - 29 mmol/L   Calcium 9.8 8.6 - 10.2 mg/dL  TSH     Status: None   Collection Time: 09/22/23  9:05 AM  Result Value Ref Range   TSH 1.510 0.450 - 4.500 uIU/mL  Magnesium     Status: None   Collection Time: 09/22/23  9:05 AM  Result Value Ref Range   Magnesium 2.0 1.6 - 2.3 mg/dL       Assessment & Plan:   Problem List Items Addressed This Visit       Active Problems   Essential hypertension    Blood pressure well controlled with current medications.  Continue current therapy.  Will reassess at follow up.        Relevant Medications   atorvastatin (LIPITOR) 10 MG tablet   lisinopril (ZESTRIL) 10 MG tablet   Other Relevant Orders   CMP14+EGFR   CBC with Diff   Mixed hyperlipidemia - Primary    Checking labs today.  Continue current therapy for lipid control. Will modify as needed based on labwork results.       Relevant Medications   atorvastatin (LIPITOR) 10 MG tablet   lisinopril (ZESTRIL) 10 MG tablet   Other Relevant Orders   Lipid panel   CMP14+EGFR   CBC with Diff   Generalized anxiety disorder    Patient stable.  Well controlled with current therapy.   Continue current meds.       Relevant Medications   ALPRAZolam (XANAX) 0.5 MG tablet   Acquired hypothyroidism    Patient stable.  Well controlled with current therapy.   Continue current meds.        Relevant Orders   CMP14+EGFR   TSH   CBC with Diff   Chronic active hepatitis (HCC)    Patient is seen by GI, who manage this condition.  He is well controlled with current therapy.   Will defer to them for further changes to plan of care.       Hemochromatosis, hereditary (HCC)     Patient stable.  Well controlled with current therapy.   Continue current meds.        Other Visit Diagnoses     Acute cough       Relevant Orders   CMP14+EGFR   CBC with Diff   DG Chest 2 View   Vitamin D deficiency, unspecified       Checking labs today.  Will continue supplements as needed.   Relevant Orders   VITAMIN D 25 Hydroxy (Vit-D Deficiency, Fractures)   CMP14+EGFR   CBC with Diff   B12 deficiency due to diet       Checking labs today.  Will continue supplements as needed.   Relevant Orders   CMP14+EGFR   Vitamin B12   CBC with Diff   Prediabetes       A1C is in prediabetic ranges. Patient counseled on dietary choices and verbalized understanding. Will reassess at follow up after next lab check.   Relevant Orders   CMP14+EGFR   Hemoglobin A1c   CBC with Diff  No follow-ups on file.   Total time spent: 20 minutes  Miki Kins, FNP  09/27/2023   This document may have been prepared by [facility] Voice Recognition software and as such may include unintentional dictation errors.

## 2023-10-02 ENCOUNTER — Encounter: Payer: Self-pay | Admitting: Family

## 2023-10-02 NOTE — Assessment & Plan Note (Signed)
Blood pressure well controlled with current medications.  Continue current therapy.  Will reassess at follow up.  

## 2023-10-02 NOTE — Assessment & Plan Note (Signed)
Checking labs today.  Continue current therapy for lipid control. Will modify as needed based on labwork results.  

## 2023-10-02 NOTE — Assessment & Plan Note (Signed)
Patient stable.  Well controlled with current therapy.   Continue current meds.  

## 2023-10-02 NOTE — Assessment & Plan Note (Signed)
 Patient is seen by GI, who manage this condition.  He is well controlled with current therapy.   Will defer to them for further changes to plan of care.

## 2023-10-03 DIAGNOSIS — R0609 Other forms of dyspnea: Secondary | ICD-10-CM

## 2023-10-05 ENCOUNTER — Telehealth (HOSPITAL_COMMUNITY): Payer: Self-pay | Admitting: *Deleted

## 2023-10-05 NOTE — Telephone Encounter (Signed)
Attempted to call patient regarding upcoming cardiac CT appointment. Left message on voicemail with name and callback number Hayley Sharpe RN Navigator Cardiac Imaging [facility] [phone] Office   

## 2023-10-06 ENCOUNTER — Ambulatory Visit: Payer: 59

## 2023-10-06 ENCOUNTER — Ambulatory Visit
Admission: RE | Admit: 2023-10-06 | Discharge: 2023-10-06 | Disposition: A | Discharge status: Home / Self Care | Payer: 59 | Source: Ambulatory Visit | Attending: Medical | Admitting: Medical

## 2023-10-06 ENCOUNTER — Other Ambulatory Visit: Payer: Self-pay | Admitting: Medical

## 2023-10-06 DIAGNOSIS — R0609 Other forms of dyspnea: Secondary | ICD-10-CM | POA: Diagnosis not present

## 2023-10-06 DIAGNOSIS — R931 Abnormal findings on diagnostic imaging of heart and coronary circulation: Secondary | ICD-10-CM

## 2023-10-06 DIAGNOSIS — I2510 Atherosclerotic heart disease of native coronary artery without angina pectoris: Secondary | ICD-10-CM | POA: Diagnosis not present

## 2023-10-06 MED ORDER — SODIUM CHLORIDE 0.9 % IV BOLUS
125.0000 mL | Freq: Once | INTRAVENOUS | Status: AC
  Administered 2023-10-06: 125 mL via INTRAVENOUS

## 2023-10-06 MED ORDER — NITROGLYCERIN 0.4 MG SL SUBL
0.8000 mg | SUBLINGUAL_TABLET | Freq: Once | SUBLINGUAL | Status: AC
  Administered 2023-10-06: 0.8 mg via SUBLINGUAL

## 2023-10-06 MED ORDER — IOHEXOL 350 MG/ML SOLN
75.0000 mL | Freq: Once | INTRAVENOUS | Status: AC | PRN
  Administered 2023-10-06: 75 mL via INTRAVENOUS

## 2023-10-06 NOTE — Progress Notes (Signed)
Patient tolerated procedure well. Ambulate w/o difficulty. Denies light headedness or being dizzy. Sitting up drinking water provided. Encouraged to drink extra water today and reasoning explained. Verbalized understanding. All questions answered. ABC intact. No further needs. Discharge from procedure area w/o issues.

## 2023-10-12 ENCOUNTER — Ambulatory Visit: Payer: 59 | Attending: Medical | Admitting: Medical

## 2023-10-12 ENCOUNTER — Encounter: Payer: Self-pay | Admitting: Medical

## 2023-10-12 ENCOUNTER — Encounter: Payer: Self-pay | Admitting: Internal Medicine

## 2023-10-12 VITALS — BP 118/64 | HR 55 | Ht 65.0 in | Wt 167.6 lb

## 2023-10-12 DIAGNOSIS — I2510 Atherosclerotic heart disease of native coronary artery without angina pectoris: Secondary | ICD-10-CM | POA: Diagnosis not present

## 2023-10-12 DIAGNOSIS — I480 Paroxysmal atrial fibrillation: Secondary | ICD-10-CM | POA: Diagnosis not present

## 2023-10-12 NOTE — Patient Instructions (Addendum)
Medication Instructions:  - No changes *If you need a refill on your cardiac medications before your next appointment, please call your pharmacy*  Lab Work: - None ordered  Testing/Procedures:  [place] A DEPT OF [facility] AT [place] [address] [place] [address] Dept: [phone] Loc: [phone]  Gabriel Hoffman  10/12/2023  You are scheduled for a Cardiac Catheterization on Monday, December 16 with Dr. Lorine Bears.  1. Please arrive at the Heart & Vascular Center Entrance of [facility], [address], [place] [address] at 6:30 (This is 1 hour(s) prior to your procedure time).  Proceed to the Check-In Desk directly inside the entrance.  Procedure Parking: Use the entrance off of the [address] side of the hospital. Turn right upon entering and follow the driveway to parking that is directly in front of the Heart & Vascular Center. There is no valet parking available at this entrance, however there is an awning directly in front of the Heart & Vascular Center for drop off/ pick up for patients.  Special note: Every effort is made to have your procedure done on time. Please understand that emergencies sometimes delay scheduled procedures.  2. Diet: Do not eat solid foods after midnight.  The patient may have clear liquids until 5am upon the day of the procedure.  3. Labs: Completed 09/22/23  4. Medication instructions in preparation for your procedure:   Contrast Allergy: No  Stop taking Xarelto (Rivaroxaban) on Saturday, December 14.  On the morning of your procedure, take your any morning medicines NOT listed above.  You may use sips of water.  5. Plan to go home the same day, you will only stay overnight if medically necessary. 6. Bring a current list of your medications and current insurance cards. 7. You MUST have a responsible person to drive you home. 8. Someone MUST be  with you the first 24 hours after you arrive home or your discharge will be delayed. 9. Please wear clothes that are easy to get on and off and wear slip-on shoes.  Thank you for allowing [place] to care for you!   --  Invasive Cardiovascular services    Follow-Up: At [facility], you and your health needs are our priority.  As part of our continuing mission to provide you with exceptional heart care, we have created designated Provider Care Teams.  These Care Teams include your primary Cardiologist (physician) and Advanced Practice Providers (APPs -  Physician Assistants and Nurse Practitioners) who all work together to provide you with the care you need, when you need it.  Your next appointment:   2 week(s) after heart cath  Provider:   Debbe Odea, MD or Cadence Fransico Michael, [place]

## 2023-10-12 NOTE — H&P (View-Only) (Signed)
 Cardiology Office Note:    Date:  10/12/2023   ID:  Gabriel Hoffman, DOB [birth year], MRN [id number]  PCP:  Miki Kins, FNP  [facility] Cardiologist:  Debbe Odea, MD  [facility] Electrophysiologist:  None   Referring MD: Miki Kins, FNP   Chief Complaint: Cardiac CTA follow-up  History of Present Illness:    Gabriel Hoffman is a 67 y.o. male with a hx of  hypertension, hyperlipidemia, atrial fibrillation status post ablation in September 2020, ?  MI in February 2020, current smoker x 50 years who presents for follow-up.   Patient reported having heart attack in February 2020 while he was incarcerated.  He was taken to [facility] in [facility].  He said he was here from week, no procedures were performed and he was placed on medications.  In September he reported being taken to [facility] where he was diagnosed with atrial fibrillation and started on Xarelto.   Echocardiogram in April 2021 showed normal LVSF, EF 55 to 60%, impaired relaxation, normal LA size.  Cardiac monitor November 2022 showed paroxysmal A-fib with percent burden.  Chest CT lungs cancer screening in 2023 showed three-vessel CAD.   Patient was last seen in September 2023 reporting intermittent chest pain.  Myoview Lexiscan was ordered.  This was low risk with no evidence of ischemia, LAD and RCA calcifications noted, LVEF 69%.  The patient was last seen 09/22/23 reporting worsening dyspnea and fatigue for the last 2 months.  Cardiac CTA was ordered.  This showed coronary calcium score of 715, 80th percentile for age and sex matched, moderate proximal LAD stenosis of 50 to 69%, mild RCA stenosis of 25%, minimal left circumflex stenosis less than 25%.  It was sent for FFR testing which showed significant stenosis in the ostial first diagonal D1 with FFR 0.79.  Proximal to mid LAD FFR 0.81.  Recommended cardiac cath.  Today, cardiac CTA was reviewed.  Patient  is agreeable to pursue cardiac cath.  Patient feels breathing is overall stable.  He denies any significant chest pain.    Past Medical History:  Diagnosis Date   Anginal pain (HCC)    Anxiety    Aortic atherosclerosis (HCC)    Arthritis    Atrial fibrillation (HCC)    a.) CHA2DS2-VASc Score = 2 (HTN, vascular disease/prior MI).  b.) Rate/rhythm maintained on metoprolol succinate; chronically anticoagulated with rivaroxaban.   CAD (coronary artery disease)    Carotid artery stenosis    Cirrhosis (HCC) 06/15/2020   Complication of anesthesia    a.) seizure like activity with propofol during colonoscopy   COPD (chronic obstructive pulmonary disease) (HCC)    Current use of long term anticoagulation    a.) Rivaroxaban   Diastolic dysfunction 02/25/2020   a.) TTE 02/25/2020: normal LV function; EF 55-65%; GLS -15.8%; G1DD.   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 C282Y and S65C were negative.   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   Thrombocytopenia [facility])     Past Surgical History:  Procedure Laterality Date  CARDIAC CATHETERIZATION     CATARACT EXTRACTION Bilateral    COLONOSCOPY     FRACTURE SURGERY Left    pins rods left wrist   HARDWARE REMOVAL Right 04/30/2021   Procedure: HARDWARE REMOVAL, Right tibia IM nail removal;  Surgeon: Kennedy Bucker, MD;  Location: [facility] ORS;  Service: Orthopedics;  Laterality: Right;   HERNIA REPAIR Bilateral    LEG SURGERY Right    rod in leg   TOTAL KNEE ARTHROPLASTY Right 10/02/2021   Procedure: TOTAL KNEE ARTHROPLASTY;  Surgeon: Kennedy Bucker, MD;  Location: [facility] ORS;  Service: Orthopedics;   Laterality: Right;   UPPER GI ENDOSCOPY      Current Medications: Current Meds  Medication Sig   ALPRAZolam (XANAX) 0.5 MG tablet Take 1 tablet (0.5 mg total) by mouth 3 (three) times daily as needed for anxiety.   atorvastatin (LIPITOR) 10 MG tablet Take 1 tablet (10 mg total) by mouth daily.   benzonatate (TESSALON PERLES) 100 MG capsule Take 1 capsule (100 mg total) by mouth 3 (three) times daily as needed for cough.   escitalopram (LEXAPRO) 10 MG tablet Take 1 tablet (10 mg total) by mouth daily.   levothyroxine (SYNTHROID) 125 MCG tablet TAKE 1 TABLET DAILY.   lisinopril (ZESTRIL) 10 MG tablet Take 1 tablet (10 mg total) by mouth daily.   metoprolol succinate (TOPROL-XL) 100 MG 24 hr tablet Take 1 tablet (100 mg total) by mouth daily.   metoprolol tartrate (LOPRESSOR) 50 MG tablet TAKE 1 TABLET 90 MIN PRIOR TO CARDIAC PROCEDURE.   nitroGLYCERIN (NITROSTAT) 0.4 MG SL tablet Place 1 tablet (0.4 mg total) under the tongue every 5 (five) minutes x 3 doses as needed for chest pain.   rivaroxaban (XARELTO) 20 MG TABS tablet Take 1 tablet (20 mg total) by mouth in the morning.   XTAMPZA ER 9 MG C12A Take 9 mg by mouth daily.     Allergies:   Codeine and Propofol   Social History   Socioeconomic History   Marital status: Single    Spouse name: Not on file   Number of children: Not on file   Years of education: Not on file   Highest education level: Not on file  Occupational History   Not on file  Tobacco Use   Smoking status: Every Day    Current packs/day: 1.00    Average packs/day: 1 pack/day for 50.0 years (50.0 ttl pk-yrs)    Types: Cigarettes   Smokeless tobacco: Never   Tobacco comments:    Smokes about 4 cigarettes daily  Vaping Use   Vaping status: Never Used  Substance and Sexual Activity   Alcohol use: Not Currently    Comment: Quit   Drug use: Never   Sexual activity: Not on file  Other Topics Concern   Not on file  Social History Narrative   Alcohol/beer  once/twice a month; 1/2 ppd; used to work in [employer]. In [place]; with sister.    Social Determinants of Health   Financial Resource Strain: Not on file  Food Insecurity: Not on file  Transportation Needs: Not on file  Physical Activity: Not on file  Stress: Not on file  Social Connections: Not on file     Family History: The patient's family history includes COPD in his mother; Cancer in his mother and sister.  ROS:   Please see the history of present illness.     All other systems reviewed and are negative.  EKGs/Labs/Other Studies Reviewed:    The  following studies were reviewed today:  Cardiac CTA 10/2023   IMPRESSION: 1. Coronary calcium score of 715. This was 88th percentile for age and sex matched control.   2. Normal coronary origin with right dominance.   3. Moderate proximal LAD stenosis (50-69%).   4. Mild RCA stenosis (25%).  Minimal LCx stenosis (<25%).   5. CAD-RADS 3. Moderate stenosis. Consider symptom-guided anti-ischemic pharmacotherapy as well as risk factor modification per guideline directed care.   6. Additional analysis with CT FFR will be submitted and reported separately. 1. Left Main:  No significant stenosis.   2. LAD: significant stenosis in ostial first diagonal, FFRct 0.79. Prox-mid LAD FFRct 0.81. 3. LCX: No significant stenosis.  FFRct 0.89 4. RCA: No significant stenosis.  FFRct 0.94   IMPRESSION: 1. CT FFR analysis showed significant stenosis in the ostial first diagonal (D1), FFRct 0.79.   2. Prox-mid LAD FFRct 0.81.   3.  Recommend cardiac catheterization.    EKG:  EKG is ordered today.  The ekg ordered today demonstrates SB 55bpm, PAC, TWI aVL  Recent Labs: 06/30/2023: ALT 31 09/22/2023: BUN 16; Creatinine, Ser 1.01; Hemoglobin 14.4; Magnesium 2.0; Platelets 102; Potassium 4.4; Sodium 137; TSH 1.510  Recent Lipid Panel    Component Value Date/Time   CHOL 105 06/30/2023 1419   TRIG 397 (H) 06/30/2023 1419    HDL 26 (L) 06/30/2023 1419   CHOLHDL 4.0 06/30/2023 1419   LDLCALC 23 06/30/2023 1419   Physical Exam:    VS:  BP 118/64 (BP Location: Left Arm, Patient Position: Sitting, Cuff Size: Normal)   Pulse (!) 55   Ht 5\' 5"  (1.651 m)   Wt 167 lb 9.6 oz (76 kg)   SpO2 94%   BMI 27.89 kg/m     Wt Readings from Last 3 Encounters:  10/12/23 167 lb 9.6 oz (76 kg)  09/27/23 171 lb 12.8 oz (77.9 kg)  09/22/23 170 lb 9.6 oz (77.4 kg)     GEN:  Well nourished, well developed in no acute distress HEENT: Normal NECK: No JVD; No carotid bruits LYMPHATICS: No lymphadenopathy CARDIAC: RRR, no murmurs, rubs, gallops RESPIRATORY: Decreased lung sounds ABDOMEN: Soft, non-tender, non-distended MUSCULOSKELETAL:  No edema; No deformity  SKIN: Warm and dry NEUROLOGIC:  Alert and oriented x 3 PSYCHIATRIC:  Normal affect   ASSESSMENT:    1. Coronary artery disease involving native coronary artery of native heart, unspecified whether angina present   2. PAF (paroxysmal atrial fibrillation) (HCC)    PLAN:    In order of problems listed above:  DOE CAD Abnormal Cardiac CTA Patient reported history of heart attack in 2020 treated medically.  Recent cardiac CTA showed significant LAD stenosis with positive FFR 0.79 in ostial first diagonal, FFR 0.81 in p-mLAD.  Patient reports no change in dyspnea on exertion.  Echo has been ordered. He denies significant chest pain.  I will set him up for right left heart cath next week.  We will have to hold Xarelto prior to heart cath.  Continue Toprol, Lipitor, and lisinopril.  Palpitations History of A-fib status post ablation at [facility] in 2020 Heart monitor is pending.  EKG today shows normal sinus rhythm with PACs.  Continue Toprol 100 mg twice daily.  Continue Xarelto for stroke prophylaxis. No h/o stroke.  Bilateral carotid artery stenosis This is followed by vascular surgery.  Most recent carotid ultrasound showed 1 to 39% stenosis  bilaterally  Hypertension Blood pressure is normal today, continue current medications.  Disposition: Follow  up in 3 week(s) with MD/APP   Shared Decision Making/Informed Consent   Informed Consent   Shared Decision Making/Informed Consent The risks [chest pain, shortness of breath, cardiac arrhythmias, dizziness, blood pressure fluctuations, myocardial infarction, stroke/transient ischemic attack, nausea, vomiting, allergic reaction, radiation exposure, metallic taste sensation and life-threatening complications (estimated to be 1 in 10,000)], benefits (risk stratification, diagnosing coronary artery disease, treatment guidance) and alternatives of a cardiac PET stress test were discussed in detail with Mr. Berner and he agrees to proceed. The risks [stroke (1 in 1000), death (1 in 1000), kidney failure [usually temporary] (1 in 500), bleeding (1 in 200), allergic reaction [possibly serious] (1 in 200)], benefits (diagnostic support and management of coronary artery disease) and alternatives of a cardiac catheterization were discussed in detail with Mr. Palko and he is willing to proceed.       Signed, Yona Kosek David Stall, PA-C  10/12/2023 8:51 AM    [facility]

## 2023-10-12 NOTE — Progress Notes (Signed)
Cardiology Office Note:    Date:  10/12/2023   ID:  YONI LAUTURE, DOB [birth year], MRN [id number]  PCP:  Miki Kins, FNP  [facility] Cardiologist:  Debbe Odea, MD  [facility] Electrophysiologist:  None   Referring MD: Miki Kins, FNP   Chief Complaint: Cardiac CTA follow-up  History of Present Illness:    TEOMAN ZUPON is a 67 y.o. male with a hx of  hypertension, hyperlipidemia, atrial fibrillation status post ablation in September 2020, ?  MI in February 2020, current smoker x 50 years who presents for follow-up.   Patient reported having heart attack in February 2020 while he was incarcerated.  He was taken to [facility] in [facility].  He said he was here from week, no procedures were performed and he was placed on medications.  In September he reported being taken to [facility] where he was diagnosed with atrial fibrillation and started on Xarelto.   Echocardiogram in April 2021 showed normal LVSF, EF 55 to 60%, impaired relaxation, normal LA size.  Cardiac monitor November 2022 showed paroxysmal A-fib with percent burden.  Chest CT lungs cancer screening in 2023 showed three-vessel CAD.   Patient was last seen in September 2023 reporting intermittent chest pain.  Myoview Lexiscan was ordered.  This was low risk with no evidence of ischemia, LAD and RCA calcifications noted, LVEF 69%.  The patient was last seen 09/22/23 reporting worsening dyspnea and fatigue for the last 2 months.  Cardiac CTA was ordered.  This showed coronary calcium score of 715, 80th percentile for age and sex matched, moderate proximal LAD stenosis of 50 to 69%, mild RCA stenosis of 25%, minimal left circumflex stenosis less than 25%.  It was sent for FFR testing which showed significant stenosis in the ostial first diagonal D1 with FFR 0.79.  Proximal to mid LAD FFR 0.81.  Recommended cardiac cath.  Today, cardiac CTA was reviewed.  Patient  is agreeable to pursue cardiac cath.  Patient feels breathing is overall stable.  He denies any significant chest pain.    Past Medical History:  Diagnosis Date   Anginal pain (HCC)    Anxiety    Aortic atherosclerosis (HCC)    Arthritis    Atrial fibrillation (HCC)    a.) CHA2DS2-VASc Score = 2 (HTN, vascular disease/prior MI).  b.) Rate/rhythm maintained on metoprolol succinate; chronically anticoagulated with rivaroxaban.   CAD (coronary artery disease)    Carotid artery stenosis    Cirrhosis (HCC) 06/15/2020   Complication of anesthesia    a.) seizure like activity with propofol during colonoscopy   COPD (chronic obstructive pulmonary disease) (HCC)    Current use of long term anticoagulation    a.) Rivaroxaban   Diastolic dysfunction 02/25/2020   a.) TTE 02/25/2020: normal LV function; EF 55-65%; GLS -15.8%; G1DD.   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 C282Y and S65C were negative.   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   Thrombocytopenia [facility])     Past Surgical History:  Procedure Laterality Date  CARDIAC CATHETERIZATION     CATARACT EXTRACTION Bilateral    COLONOSCOPY     FRACTURE SURGERY Left    pins rods left wrist   HARDWARE REMOVAL Right 04/30/2021   Procedure: HARDWARE REMOVAL, Right tibia IM nail removal;  Surgeon: Kennedy Bucker, MD;  Location: [facility] ORS;  Service: Orthopedics;  Laterality: Right;   HERNIA REPAIR Bilateral    LEG SURGERY Right    rod in leg   TOTAL KNEE ARTHROPLASTY Right 10/02/2021   Procedure: TOTAL KNEE ARTHROPLASTY;  Surgeon: Kennedy Bucker, MD;  Location: [facility] ORS;  Service: Orthopedics;   Laterality: Right;   UPPER GI ENDOSCOPY      Current Medications: Current Meds  Medication Sig   ALPRAZolam (XANAX) 0.5 MG tablet Take 1 tablet (0.5 mg total) by mouth 3 (three) times daily as needed for anxiety.   atorvastatin (LIPITOR) 10 MG tablet Take 1 tablet (10 mg total) by mouth daily.   benzonatate (TESSALON PERLES) 100 MG capsule Take 1 capsule (100 mg total) by mouth 3 (three) times daily as needed for cough.   escitalopram (LEXAPRO) 10 MG tablet Take 1 tablet (10 mg total) by mouth daily.   levothyroxine (SYNTHROID) 125 MCG tablet TAKE 1 TABLET DAILY.   lisinopril (ZESTRIL) 10 MG tablet Take 1 tablet (10 mg total) by mouth daily.   metoprolol succinate (TOPROL-XL) 100 MG 24 hr tablet Take 1 tablet (100 mg total) by mouth daily.   metoprolol tartrate (LOPRESSOR) 50 MG tablet TAKE 1 TABLET 90 MIN PRIOR TO CARDIAC PROCEDURE.   nitroGLYCERIN (NITROSTAT) 0.4 MG SL tablet Place 1 tablet (0.4 mg total) under the tongue every 5 (five) minutes x 3 doses as needed for chest pain.   rivaroxaban (XARELTO) 20 MG TABS tablet Take 1 tablet (20 mg total) by mouth in the morning.   XTAMPZA ER 9 MG C12A Take 9 mg by mouth daily.     Allergies:   Codeine and Propofol   Social History   Socioeconomic History   Marital status: Single    Spouse name: Not on file   Number of children: Not on file   Years of education: Not on file   Highest education level: Not on file  Occupational History   Not on file  Tobacco Use   Smoking status: Every Day    Current packs/day: 1.00    Average packs/day: 1 pack/day for 50.0 years (50.0 ttl pk-yrs)    Types: Cigarettes   Smokeless tobacco: Never   Tobacco comments:    Smokes about 4 cigarettes daily  Vaping Use   Vaping status: Never Used  Substance and Sexual Activity   Alcohol use: Not Currently    Comment: Quit   Drug use: Never   Sexual activity: Not on file  Other Topics Concern   Not on file  Social History Narrative   Alcohol/beer  once/twice a month; 1/2 ppd; used to work in [employer]. In [place]; with sister.    Social Determinants of Health   Financial Resource Strain: Not on file  Food Insecurity: Not on file  Transportation Needs: Not on file  Physical Activity: Not on file  Stress: Not on file  Social Connections: Not on file     Family History: The patient's family history includes COPD in his mother; Cancer in his mother and sister.  ROS:   Please see the history of present illness.     All other systems reviewed and are negative.  EKGs/Labs/Other Studies Reviewed:    The  following studies were reviewed today:  Cardiac CTA 10/2023   IMPRESSION: 1. Coronary calcium score of 715. This was 88th percentile for age and sex matched control.   2. Normal coronary origin with right dominance.   3. Moderate proximal LAD stenosis (50-69%).   4. Mild RCA stenosis (25%).  Minimal LCx stenosis (<25%).   5. CAD-RADS 3. Moderate stenosis. Consider symptom-guided anti-ischemic pharmacotherapy as well as risk factor modification per guideline directed care.   6. Additional analysis with CT FFR will be submitted and reported separately. 1. Left Main:  No significant stenosis.   2. LAD: significant stenosis in ostial first diagonal, FFRct 0.79. Prox-mid LAD FFRct 0.81. 3. LCX: No significant stenosis.  FFRct 0.89 4. RCA: No significant stenosis.  FFRct 0.94   IMPRESSION: 1. CT FFR analysis showed significant stenosis in the ostial first diagonal (D1), FFRct 0.79.   2. Prox-mid LAD FFRct 0.81.   3.  Recommend cardiac catheterization.    EKG:  EKG is ordered today.  The ekg ordered today demonstrates SB 55bpm, PAC, TWI aVL  Recent Labs: 06/30/2023: ALT 31 09/22/2023: BUN 16; Creatinine, Ser 1.01; Hemoglobin 14.4; Magnesium 2.0; Platelets 102; Potassium 4.4; Sodium 137; TSH 1.510  Recent Lipid Panel    Component Value Date/Time   CHOL 105 06/30/2023 1419   TRIG 397 (H) 06/30/2023 1419    HDL 26 (L) 06/30/2023 1419   CHOLHDL 4.0 06/30/2023 1419   LDLCALC 23 06/30/2023 1419   Physical Exam:    VS:  BP 118/64 (BP Location: Left Arm, Patient Position: Sitting, Cuff Size: Normal)   Pulse (!) 55   Ht 5\' 5"  (1.651 m)   Wt 167 lb 9.6 oz (76 kg)   SpO2 94%   BMI 27.89 kg/m     Wt Readings from Last 3 Encounters:  10/12/23 167 lb 9.6 oz (76 kg)  09/27/23 171 lb 12.8 oz (77.9 kg)  09/22/23 170 lb 9.6 oz (77.4 kg)     GEN:  Well nourished, well developed in no acute distress HEENT: Normal NECK: No JVD; No carotid bruits LYMPHATICS: No lymphadenopathy CARDIAC: RRR, no murmurs, rubs, gallops RESPIRATORY: Decreased lung sounds ABDOMEN: Soft, non-tender, non-distended MUSCULOSKELETAL:  No edema; No deformity  SKIN: Warm and dry NEUROLOGIC:  Alert and oriented x 3 PSYCHIATRIC:  Normal affect   ASSESSMENT:    1. Coronary artery disease involving native coronary artery of native heart, unspecified whether angina present   2. PAF (paroxysmal atrial fibrillation) (HCC)    PLAN:    In order of problems listed above:  DOE CAD Abnormal Cardiac CTA Patient reported history of heart attack in 2020 treated medically.  Recent cardiac CTA showed significant LAD stenosis with positive FFR 0.79 in ostial first diagonal, FFR 0.81 in p-mLAD.  Patient reports no change in dyspnea on exertion.  Echo has been ordered. He denies significant chest pain.  I will set him up for right left heart cath next week.  We will have to hold Xarelto prior to heart cath.  Continue Toprol, Lipitor, and lisinopril.  Palpitations History of A-fib status post ablation at [facility] in 2020 Heart monitor is pending.  EKG today shows normal sinus rhythm with PACs.  Continue Toprol 100 mg twice daily.  Continue Xarelto for stroke prophylaxis. No h/o stroke.  Bilateral carotid artery stenosis This is followed by vascular surgery.  Most recent carotid ultrasound showed 1 to 39% stenosis  bilaterally  Hypertension Blood pressure is normal today, continue current medications.  Disposition: Follow  up in 3 week(s) with MD/APP   Shared Decision Making/Informed Consent   Informed Consent   Shared Decision Making/Informed Consent The risks [chest pain, shortness of breath, cardiac arrhythmias, dizziness, blood pressure fluctuations, myocardial infarction, stroke/transient ischemic attack, nausea, vomiting, allergic reaction, radiation exposure, metallic taste sensation and life-threatening complications (estimated to be 1 in 10,000)], benefits (risk stratification, diagnosing coronary artery disease, treatment guidance) and alternatives of a cardiac PET stress test were discussed in detail with Mr. Berner and he agrees to proceed. The risks [stroke (1 in 1000), death (1 in 1000), kidney failure [usually temporary] (1 in 500), bleeding (1 in 200), allergic reaction [possibly serious] (1 in 200)], benefits (diagnostic support and management of coronary artery disease) and alternatives of a cardiac catheterization were discussed in detail with Mr. Palko and he is willing to proceed.       Signed, Yona Kosek David Stall, PA-C  10/12/2023 8:51 AM    [facility]

## 2023-10-13 ENCOUNTER — Telehealth: Payer: Self-pay

## 2023-10-13 ENCOUNTER — Other Ambulatory Visit: Payer: Self-pay | Admitting: Family

## 2023-10-13 ENCOUNTER — Ambulatory Visit: Admission: RE | Admit: 2023-10-13 | Payer: 59 | Source: Ambulatory Visit

## 2023-10-13 NOTE — Telephone Encounter (Signed)
Spoke to patient and informed him that his cardiac catheterization time had been moved to 9:30 AM and to show up at 8:30 AM. Patient understood with read back

## 2023-10-14 ENCOUNTER — Telehealth: Payer: Self-pay

## 2023-10-14 NOTE — Telephone Encounter (Signed)
Per Cadence, pt is to stopped xarelto 2 days prior to procedure, then take ASA two days prior to procedure and the day of.   Nurse attempted to contact pt. Left a message for the patient to call back.

## 2023-10-17 ENCOUNTER — Encounter
Admission: RE | Disposition: A | Discharge status: Home / Self Care | Payer: Self-pay | Source: Home / Self Care | Attending: Cardiovascular Disease

## 2023-10-17 ENCOUNTER — Ambulatory Visit: Payer: 59 | Attending: Medical

## 2023-10-17 ENCOUNTER — Ambulatory Visit
Admission: RE | Admit: 2023-10-17 | Discharge: 2023-10-17 | Disposition: A | Discharge status: Home / Self Care | Payer: 59 | Attending: Cardiovascular Disease | Admitting: Cardiovascular Disease

## 2023-10-17 ENCOUNTER — Telehealth: Payer: Self-pay | Admitting: Cardiology

## 2023-10-17 ENCOUNTER — Other Ambulatory Visit: Payer: Self-pay

## 2023-10-17 DIAGNOSIS — I480 Paroxysmal atrial fibrillation: Secondary | ICD-10-CM | POA: Diagnosis not present

## 2023-10-17 DIAGNOSIS — R002 Palpitations: Secondary | ICD-10-CM | POA: Diagnosis not present

## 2023-10-17 DIAGNOSIS — Z79899 Other long term (current) drug therapy: Secondary | ICD-10-CM | POA: Insufficient documentation

## 2023-10-17 DIAGNOSIS — R0609 Other forms of dyspnea: Secondary | ICD-10-CM | POA: Diagnosis not present

## 2023-10-17 DIAGNOSIS — F17210 Nicotine dependence, cigarettes, uncomplicated: Secondary | ICD-10-CM | POA: Diagnosis not present

## 2023-10-17 DIAGNOSIS — Z7901 Long term (current) use of anticoagulants: Secondary | ICD-10-CM | POA: Diagnosis not present

## 2023-10-17 DIAGNOSIS — I25110 Atherosclerotic heart disease of native coronary artery with unstable angina pectoris: Secondary | ICD-10-CM | POA: Diagnosis not present

## 2023-10-17 DIAGNOSIS — I252 Old myocardial infarction: Secondary | ICD-10-CM | POA: Insufficient documentation

## 2023-10-17 DIAGNOSIS — I10 Essential (primary) hypertension: Secondary | ICD-10-CM | POA: Insufficient documentation

## 2023-10-17 DIAGNOSIS — I6523 Occlusion and stenosis of bilateral carotid arteries: Secondary | ICD-10-CM | POA: Diagnosis not present

## 2023-10-17 DIAGNOSIS — E785 Hyperlipidemia, unspecified: Secondary | ICD-10-CM | POA: Insufficient documentation

## 2023-10-17 DIAGNOSIS — I2510 Atherosclerotic heart disease of native coronary artery without angina pectoris: Secondary | ICD-10-CM

## 2023-10-17 DIAGNOSIS — I200 Unstable angina: Secondary | ICD-10-CM

## 2023-10-17 HISTORY — PX: RIGHT/LEFT HEART CATH AND CORONARY ANGIOGRAPHY: CATH118266

## 2023-10-17 SURGERY — RIGHT/LEFT HEART CATH AND CORONARY ANGIOGRAPHY
Anesthesia: Moderate Sedation | Laterality: Bilateral

## 2023-10-17 MED ORDER — SODIUM CHLORIDE 0.9 % IV SOLN: 250.0000 mL | INTRAVENOUS | Status: DC | PRN

## 2023-10-17 MED ORDER — HEPARIN (PORCINE) IN NACL 1000-0.9 UT/500ML-% IV SOLN
INTRAVENOUS | Status: DC | PRN
  Administered 2023-10-17 (×2): 500 mL

## 2023-10-17 MED ORDER — HEPARIN (PORCINE) IN NACL 1000-0.9 UT/500ML-% IV SOLN
INTRAVENOUS | Status: AC
  Filled 2023-10-17: qty 1000

## 2023-10-17 MED ORDER — ACETAMINOPHEN 325 MG PO TABS: 650.0000 mg | ORAL_TABLET | ORAL | Status: DC | PRN

## 2023-10-17 MED ORDER — FENTANYL CITRATE (PF) 100 MCG/2ML IJ SOLN
INTRAMUSCULAR | Status: DC | PRN
  Administered 2023-10-17: 25 ug via INTRAVENOUS

## 2023-10-17 MED ORDER — VERAPAMIL HCL 2.5 MG/ML IV SOLN
INTRAVENOUS | Status: DC | PRN
  Administered 2023-10-17: 2.5 mg via INTRA_ARTERIAL

## 2023-10-17 MED ORDER — SODIUM CHLORIDE 0.9% FLUSH: 3.0000 mL | Freq: Two times a day (BID) | INTRAVENOUS | Status: DC

## 2023-10-17 MED ORDER — ONDANSETRON HCL 4 MG/2ML IJ SOLN
4.0000 mg | Freq: Four times a day (QID) | INTRAMUSCULAR | Status: DC | PRN
Start: 2023-10-17 — End: 2023-10-17

## 2023-10-17 MED ORDER — HEPARIN SODIUM (PORCINE) 1000 UNIT/ML IJ SOLN
INTRAMUSCULAR | Status: AC
  Filled 2023-10-17: qty 10

## 2023-10-17 MED ORDER — SODIUM CHLORIDE 0.9 % WEIGHT BASED INFUSION
3.0000 mL/kg/h | INTRAVENOUS | Status: AC
  Administered 2023-10-17: 3 mL/kg/h via INTRAVENOUS

## 2023-10-17 MED ORDER — LIDOCAINE HCL 1 % IJ SOLN
INTRAMUSCULAR | Status: AC
  Filled 2023-10-17: qty 20

## 2023-10-17 MED ORDER — SODIUM CHLORIDE 0.9% FLUSH: 3.0000 mL | INTRAVENOUS | Status: DC | PRN

## 2023-10-17 MED ORDER — VERAPAMIL HCL 2.5 MG/ML IV SOLN
INTRAVENOUS | Status: AC
  Filled 2023-10-17: qty 2

## 2023-10-17 MED ORDER — LIDOCAINE HCL (PF) 1 % IJ SOLN
INTRAMUSCULAR | Status: DC | PRN
  Administered 2023-10-17: 2 mL

## 2023-10-17 MED ORDER — FENTANYL CITRATE (PF) 100 MCG/2ML IJ SOLN
INTRAMUSCULAR | Status: AC
  Filled 2023-10-17: qty 2

## 2023-10-17 MED ORDER — MIDAZOLAM HCL 2 MG/2ML IJ SOLN
INTRAMUSCULAR | Status: AC
  Filled 2023-10-17: qty 2

## 2023-10-17 MED ORDER — SODIUM CHLORIDE 0.9 % WEIGHT BASED INFUSION
1.0000 mL/kg/h | INTRAVENOUS | Status: DC
  Administered 2023-10-17: 1 mL/kg/h via INTRAVENOUS

## 2023-10-17 MED ORDER — IOHEXOL 300 MG/ML  SOLN
INTRAMUSCULAR | Status: DC | PRN
  Administered 2023-10-17: 48 mL

## 2023-10-17 MED ORDER — ASPIRIN 81 MG PO CHEW
CHEWABLE_TABLET | ORAL | Status: AC
  Filled 2023-10-17: qty 1

## 2023-10-17 MED ORDER — SODIUM CHLORIDE 0.9 % IV SOLN: INTRAVENOUS | Status: DC

## 2023-10-17 MED ORDER — HEPARIN SODIUM (PORCINE) 1000 UNIT/ML IJ SOLN
INTRAMUSCULAR | Status: DC | PRN
  Administered 2023-10-17: 4000 [IU] via INTRAVENOUS

## 2023-10-17 MED ORDER — ASPIRIN 81 MG PO CHEW
81.0000 mg | CHEWABLE_TABLET | ORAL | Status: AC
  Administered 2023-10-17: 81 mg via ORAL

## 2023-10-17 MED ORDER — MIDAZOLAM HCL 2 MG/2ML IJ SOLN
INTRAMUSCULAR | Status: DC | PRN
  Administered 2023-10-17: 1 mg via INTRAVENOUS

## 2023-10-17 SURGICAL SUPPLY — 11 items
CATH INFINITI AMBI 5FR JK (CATHETERS) IMPLANT
CATH INFINITI JR4 5F (CATHETERS) IMPLANT
DEVICE RAD TR BAND REGULAR (VASCULAR PRODUCTS) IMPLANT
DRAPE BRACHIAL (DRAPES) IMPLANT
GLIDESHEATH SLEND SS 6F .021 (SHEATH) IMPLANT
GUIDEWIRE INQWIRE 1.5J.035X260 (WIRE) IMPLANT
INQWIRE 1.5J .035X260CM (WIRE) ×1
PACK CARDIAC CATH (CUSTOM PROCEDURE TRAY) ×1 IMPLANT
PROTECTION STATION PRESSURIZED (MISCELLANEOUS) ×1
SET ATX-X65L (MISCELLANEOUS) IMPLANT
STATION PROTECTION PRESSURIZED (MISCELLANEOUS) IMPLANT

## 2023-10-17 NOTE — Telephone Encounter (Signed)
Caller Erskine Squibb) stated she is reporting test results.

## 2023-10-17 NOTE — Interval H&P Note (Signed)
History and Physical Interval Note:  10/17/2023 10:45 AM  Gabriel Hoffman  has presented today for surgery, with the diagnosis of R and L Cath   Unstable angina.  The various methods of treatment have been discussed with the patient and family. After consideration of risks, benefits and other options for treatment, the patient has consented to  Procedure(s): RIGHT/LEFT HEART CATH AND CORONARY ANGIOGRAPHY (Bilateral) as a surgical intervention.  The patient's history has been reviewed, patient examined, no change in status, stable for surgery.  I have reviewed the patient's chart and labs.  Questions were answered to the patient's satisfaction.     Lorine Bears

## 2023-10-17 NOTE — Telephone Encounter (Signed)
If they should call back they would need to page ordering provider in the hospital.

## 2023-10-17 NOTE — Telephone Encounter (Signed)
They hung up.  This is a inpatient

## 2023-10-18 ENCOUNTER — Encounter: Payer: Self-pay | Admitting: Cardiovascular Disease

## 2023-10-19 ENCOUNTER — Other Ambulatory Visit (INDEPENDENT_AMBULATORY_CARE_PROVIDER_SITE_OTHER): Payer: Self-pay | Admitting: Vascular Surgery

## 2023-10-19 DIAGNOSIS — I6523 Occlusion and stenosis of bilateral carotid arteries: Secondary | ICD-10-CM

## 2023-10-19 DIAGNOSIS — R0609 Other forms of dyspnea: Secondary | ICD-10-CM | POA: Diagnosis not present

## 2023-10-20 ENCOUNTER — Encounter: Payer: Self-pay | Admitting: Internal Medicine

## 2023-10-24 ENCOUNTER — Ambulatory Visit (INDEPENDENT_AMBULATORY_CARE_PROVIDER_SITE_OTHER): Payer: 59

## 2023-10-24 ENCOUNTER — Other Ambulatory Visit: Payer: Self-pay

## 2023-10-24 ENCOUNTER — Ambulatory Visit (INDEPENDENT_AMBULATORY_CARE_PROVIDER_SITE_OTHER): Payer: 59 | Admitting: Vascular Surgery

## 2023-10-24 ENCOUNTER — Encounter (INDEPENDENT_AMBULATORY_CARE_PROVIDER_SITE_OTHER): Payer: Self-pay | Admitting: Vascular Surgery

## 2023-10-24 VITALS — BP 160/76 | HR 62 | Resp 18 | Ht 65.0 in | Wt 167.8 lb

## 2023-10-24 DIAGNOSIS — E782 Mixed hyperlipidemia: Secondary | ICD-10-CM | POA: Diagnosis not present

## 2023-10-24 DIAGNOSIS — I2583 Coronary atherosclerosis due to lipid rich plaque: Secondary | ICD-10-CM

## 2023-10-24 DIAGNOSIS — I491 Atrial premature depolarization: Secondary | ICD-10-CM

## 2023-10-24 DIAGNOSIS — I2510 Atherosclerotic heart disease of native coronary artery without angina pectoris: Secondary | ICD-10-CM

## 2023-10-24 DIAGNOSIS — I10 Essential (primary) hypertension: Secondary | ICD-10-CM

## 2023-10-24 DIAGNOSIS — I6523 Occlusion and stenosis of bilateral carotid arteries: Secondary | ICD-10-CM

## 2023-10-24 NOTE — Progress Notes (Signed)
MRN : [id number]  Gabriel Hoffman is a 67 y.o. (1956-09-18) male who presents with chief complaint of check carotid arteries.  History of Present Illness:   The patient is seen for evaluation of carotid stenosis. The carotid stenosis was identified after carotid ultrasound.     The patient denies amaurosis fugax. There is no recent history of TIA symptoms or focal motor deficits. There is no prior documented CVA.  He does endorse having some numbness and tingling in his hand, however the patient does have known back issues as well.   There is no history of migraine headaches. There is no history of seizures.   The patient is taking enteric-coated aspirin 81 mg daily.   No recent shortening of the patient's walking distance or new symptoms consistent with claudication.  No history of rest pain symptoms. No new ulcers or wounds of the lower extremities have occurred.   There is no history of DVT, PE or superficial thrombophlebitis. No recent episodes of angina or shortness of breath documented.    Carotid Duplex done today shows RICA 1-39% and LICA 1-39%  No outpatient medications have been marked as taking for the 10/24/23 encounter (Appointment) with Gilda Crease, Latina Craver, MD.    Past Medical History:  Diagnosis Date   Anginal pain (HCC)    Anxiety    Aortic atherosclerosis (HCC)    Arthritis    Atrial fibrillation (HCC)    a.) CHA2DS2-VASc Score = 2 (HTN, vascular disease/prior MI).  b.) Rate/rhythm maintained on metoprolol succinate; chronically anticoagulated with rivaroxaban.   CAD (coronary artery disease)    Carotid artery stenosis    Cirrhosis (HCC) 06/15/2020   Complication of anesthesia    a.) seizure like activity with propofol during colonoscopy   COPD (chronic obstructive pulmonary disease) (HCC)    Current use of long term anticoagulation    a.) Rivaroxaban   Diastolic dysfunction 02/25/2020   a.) TTE 02/25/2020: normal LV function; EF  55-65%; GLS -15.8%; G1DD.   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 C282Y and S65C were negative.   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   Thrombocytopenia [facility])     Past Surgical History:  Procedure Laterality Date   CARDIAC CATHETERIZATION     CATARACT EXTRACTION Bilateral    COLONOSCOPY     FRACTURE SURGERY Left    pins rods left wrist   HARDWARE REMOVAL Right 04/30/2021   Procedure: HARDWARE REMOVAL, Right tibia IM nail removal;  Surgeon: Kennedy Bucker, MD;  Location: [facility] ORS;  Service: Orthopedics;  Laterality: Right;   HERNIA REPAIR Bilateral    LEG SURGERY Right    rod in leg   RIGHT/LEFT HEART CATH AND CORONARY ANGIOGRAPHY Bilateral 10/17/2023   Procedure: RIGHT/LEFT HEART CATH AND CORONARY ANGIOGRAPHY;  Surgeon: Iran Ouch, MD;  Location: [facility] INVASIVE CV [facility];  Service: Cardiovascular;  Laterality: Bilateral;   TOTAL KNEE ARTHROPLASTY Right 10/02/2021   Procedure: TOTAL KNEE ARTHROPLASTY;  Surgeon: Kennedy Bucker, MD;  Location: [facility] ORS;  Service: Orthopedics;  Laterality: Right;   UPPER GI ENDOSCOPY      Social History Social History   Tobacco Use   Smoking status: Every Day    Current packs/day: 1.00    Average packs/day: 1 pack/day for 50.0 years (50.0 ttl pk-yrs)    Types: Cigarettes   Smokeless tobacco: Never   Tobacco comments:    Smokes about 4 cigarettes daily  Vaping Use   Vaping status: Never Used  Substance Use Topics   Alcohol use: Not Currently    Comment: Quit   Drug use: Never    Family History Family History  Problem Relation Age of Onset   Cancer Mother    COPD Mother    Cancer Sister      Allergies  Allergen Reactions   Codeine Nausea And Vomiting    With fever   Propofol Other (See Comments)    Seizure like activity during colonoscopy.  Tolerated propofol infusion with no reaction at all 10/02/21.     REVIEW OF SYSTEMS (Negative unless checked)  Constitutional: [] Weight loss  [] Fever  [] Chills Cardiac: [] Chest pain   [] Chest pressure   [] Palpitations   [] Shortness of breath when laying flat   [] Shortness of breath with exertion. Vascular:  [x] Pain in legs with walking   [] Pain in legs at rest  [] History of DVT   [] Phlebitis   [] Swelling in legs   [] Varicose veins   [] Non-healing ulcers Pulmonary:   [] Uses home oxygen   [] Productive cough   [] Hemoptysis   [] Wheeze  [] COPD   [] Asthma Neurologic:  [] Dizziness   [] Seizures   [] History of stroke   [] History of TIA  [] Aphasia   [] Vissual changes   [] Weakness or numbness in arm   [] Weakness or numbness in leg Musculoskeletal:   [] Joint swelling   [] Joint pain   [] Low back pain Hematologic:  [] Easy bruising  [] Easy bleeding   [] Hypercoagulable state   [] Anemic Gastrointestinal:  [] Diarrhea   [] Vomiting  [] Gastroesophageal reflux/heartburn   [] Difficulty swallowing. Genitourinary:  [] Chronic kidney disease   [] Difficult urination  [] Frequent urination   [] Blood in urine Skin:  [] Rashes   [] Ulcers  Psychological:  [] History of anxiety   []  History of major depression.  Physical Examination  There were no vitals filed for this visit. There is no height or weight on file to calculate BMI. Gen: WD/WN, NAD Head: [place]/AT, No temporalis wasting.  Ear/Nose/Throat: Hearing grossly intact, nares w/o erythema or drainage Eyes: PER, EOMI, sclera nonicteric.  Neck: Supple, no masses.  No bruit or JVD.  Pulmonary:  Good air movement, no audible wheezing, no use of accessory muscles.  Cardiac: RRR, normal S1, S2, no Murmurs. Vascular:  carotid bruit noted Vessel Right Left  Radial Palpable Palpable  Carotid  Palpable  Palpable   Subclav  Palpable Palpable  Gastrointestinal: soft, non-distended. No guarding/no peritoneal signs.  Musculoskeletal: M/S 5/5 throughout.  No visible deformity.  Neurologic: CN 2-12 intact. Pain and light touch intact in extremities.  Symmetrical.  Speech is fluent. Motor exam as listed above. Psychiatric: Judgment intact, Mood & affect appropriate for pt's clinical situation. Dermatologic: No rashes or ulcers noted.  No changes consistent with cellulitis.   CBC Lab Results  Component Value Date   WBC 6.8 09/22/2023   HGB 14.4 09/22/2023   HCT 42.8 09/22/2023   MCV 93 09/22/2023   PLT 102 (L) 09/22/2023    BMET    Component Value Date/Time  NA 137 09/22/2023 0905   NA 143 04/24/2012 1746   K 4.4 09/22/2023 0905   K 3.6 04/24/2012 1746   CL 103 09/22/2023 0905   CL 109 (H) 04/24/2012 1746   CO2 23 09/22/2023 0905   CO2 23 04/24/2012 1746   GLUCOSE 103 (H) 09/22/2023 0905   GLUCOSE 99 02/23/2023 0914   GLUCOSE 80 04/24/2012 1746   BUN 16 09/22/2023 0905   BUN 7 04/24/2012 1746   CREATININE 1.01 09/22/2023 0905   CREATININE 0.87 04/24/2012 1746   CALCIUM 9.8 09/22/2023 0905   CALCIUM 8.9 04/24/2012 1746   GFRNONAA >60 02/23/2023 0914   GFRNONAA >60 04/24/2012 1746   GFRAA >60 07/25/2020 1216   GFRAA >60 04/24/2012 1746   CrCl cannot be calculated (Patient's most recent lab result is older than the maximum 21 days allowed.).  COAG Lab Results  Component Value Date   INR 1.1 09/21/2021    Radiology CARDIAC CATHETERIZATION Result Date: 10/17/2023   1st Diag lesion is 50% stenosed.   Prox LAD lesion is 30% stenosed.   The left ventricular systolic function is normal.   LV end diastolic pressure is mildly elevated.   The left ventricular ejection fraction is 50-55% by visual estimate. 1.  Mild to moderate nonobstructive one-vessel coronary artery disease involving the proximal LAD/diagonal bifurcation. 2.  Low normal LV systolic function. 3.  Mildly to moderately  elevated left ventricular end-diastolic pressure at 20 mmHg likely due to diastolic dysfunction. Recommendations: Recommend aggressive medical therapy for nonobstructive coronary artery disease. Proceed with an echocardiogram to evaluate diastolic function.   CT CORONARY MORPH W/CTA COR W/SCORE W/CA W/CM &/OR WO/CM Addendum Date: 10/16/2023 ADDENDUM REPORT: 10/16/2023 22:24 EXAM: OVER-READ INTERPRETATION  CT CHEST The following report is an over-read performed by radiologist Dr. Aram Candela of [facility] on 10/16/2023. This over-read does not include interpretation of cardiac or coronary anatomy or pathology. The coronary calcium score/coronary CTA interpretation by the cardiologist is attached. COMPARISON:  Mar 17, 2023 FINDINGS: Cardiovascular: There are no significant extracardiac vascular findings. Mediastinum/Nodes: There are no enlarged lymph nodes within the visualized mediastinum. Lungs/Pleura: There is no pleural effusion. A stable 4 mm noncalcified lung nodule is seen within the posterior aspect of the right upper lobe (axial CT image 1, CT series 12). A new 4 mm noncalcified lung nodule is also seen within the posterior aspect of the right upper lobe (axial CT image 6, CT series 12). A stable 3 mm pleural based anterolateral right lower lobe lung nodule is seen (axial CT image 41, CT series 12). It should be noted that evaluation of the inferior aspect of the bilateral lower lobes is limited in evaluation secondary to patient motion. Upper abdomen: No significant findings in the visualized upper abdomen. Musculoskeletal/Chest wall: No chest wall mass or suspicious osseous findings within the visualized chest. IMPRESSION: 1. Stable 3 mm and 4 mm noncalcified lung nodules, as described above, with a new 4 mm noncalcified right upper lobe lung nodule. Continuation with the previously recommended low-dose screening chest CT is recommended in 12 months from the prior study (January 15, 2023). This recommendation follows the consensus statement: Guidelines for Management of Incidental Pulmonary Nodules Detected on CT Images: From the Fleischner Society 2017; Radiology 2017; 284:228-243. Electronically Signed   By: Aram Candela M.D.   On: 10/16/2023 22:24   Result Date: 10/16/2023 CLINICAL DATA:  Dyspnea on exertion EXAM: Cardiac/Coronary  CTA TECHNIQUE: The patient was scanned on a Siemens Somatom scanner. :  A retrospective scan was triggered in the ascending thoracic aorta. Axial non-contrast 3 mm slices were carried out through the heart. The data set was analyzed on a dedicated work station and scored using the Agatson method. Gantry rotation speed was 330 msecs and collimation was .6 mm. 50mg  of metoprolol and 0.8 mg of sl NTG was given. The 3D data set was reconstructed in 5% intervals of the 60-95 % of the R-R cycle. Diastolic phases were analyzed on a dedicated work station using MPR, MIP and VRT modes. The patient received 75 cc of contrast. FINDINGS: Aorta:  Normal size.  No calcifications.  No dissection. Aortic Valve:  Trileaflet.  No calcifications. Coronary Arteries:  Normal coronary origin.  Right dominance. RCA is a dominant artery. There is calcified plaque proximally causing mild stenosis (25%). Left main gives rise to LAD and LCX arteries. LM has no disease. LAD has calcified plaque proximally causing moderate stenosis (50-69%). LCX is a non-dominant artery. There is calcified plaque proximally causing minimal stenosis (<25%). Other findings: Normal pulmonary vein drainage into the left atrium. Normal left atrial appendage without a thrombus. Normal size of the pulmonary artery. IMPRESSION: 1. Coronary calcium score of 715. This was 88th percentile for age and sex matched control. 2. Normal coronary origin with right dominance. 3. Moderate proximal LAD stenosis (50-69%). 4. Mild RCA stenosis (25%).  Minimal LCx stenosis (<25%). 5. CAD-RADS 3. Moderate stenosis. Consider  symptom-guided anti-ischemic pharmacotherapy as well as risk factor modification per guideline directed care. 6. Additional analysis with CT FFR will be submitted and reported separately. Electronically Signed: By: Debbe Odea M.D. On: 10/06/2023 13:33   CT CORONARY FFR DATA PREP & FLUID ANALYSIS Result Date: 10/07/2023 EXAM: CT FFR ANALYSIS CLINICAL DATA:  Abnormal CCTA FINDINGS: FFRct analysis was performed on the original cardiac CT angiogram dataset. Diagrammatic representation of the FFRct analysis is provided in a separate PDF document in PACS. This dictation was created using the PDF document and an interactive 3D model of the results. 3D model is not available in the EMR/PACS. Normal FFR range is >0.80. 1. Left Main:  No significant stenosis. 2. LAD: significant stenosis in ostial first diagonal, FFRct 0.79. Prox-mid LAD FFRct 0.81. 3. LCX: No significant stenosis.  FFRct 0.89 4. RCA: No significant stenosis.  FFRct 0.94 IMPRESSION: 1. CT FFR analysis showed significant stenosis in the ostial first diagonal (D1), FFRct 0.79. 2. Prox-mid LAD FFRct 0.81. 3.  Recommend cardiac catheterization. Electronically Signed   By: Debbe Odea M.D.   On: 10/07/2023 08:58     Assessment/Plan 1. Bilateral carotid artery stenosis (Primary) Recommend:  Given the patient's asymptomatic subcritical stenosis no further invasive testing or surgery at this time.  Duplex ultrasound shows <30% stenosis bilaterally.  Continue antiplatelet therapy as prescribed Continue management of CAD, HTN and Hyperlipidemia Healthy heart diet,  encouraged exercise at least 4 times per week  Follow up in 24 months with duplex ultrasound and physical exam   2. Coronary artery disease due to lipid rich plaque Continue cardiac and antihypertensive medications as already ordered and reviewed, no changes at this time.  Continue statin as ordered and reviewed, no changes at this time  Nitrates PRN for chest  pain  3. Essential hypertension Continue antihypertensive medications as already ordered, these medications have been reviewed and there are no changes at this time.  4. Mixed hyperlipidemia Continue statin as ordered and reviewed, no changes at this time    Levora Dredge, MD  10/24/2023 1:04 PM

## 2023-10-30 ENCOUNTER — Encounter (INDEPENDENT_AMBULATORY_CARE_PROVIDER_SITE_OTHER): Payer: Self-pay | Admitting: Vascular Surgery

## 2023-11-01 ENCOUNTER — Ambulatory Visit: Payer: 59 | Attending: Cardiology | Admitting: Cardiology

## 2023-11-01 ENCOUNTER — Telehealth: Payer: Self-pay

## 2023-11-01 ENCOUNTER — Encounter: Payer: Self-pay | Admitting: Cardiology

## 2023-11-01 VITALS — BP 160/82 | HR 52 | Ht 65.0 in | Wt 167.0 lb

## 2023-11-01 DIAGNOSIS — F17200 Nicotine dependence, unspecified, uncomplicated: Secondary | ICD-10-CM | POA: Diagnosis not present

## 2023-11-01 DIAGNOSIS — I10 Essential (primary) hypertension: Secondary | ICD-10-CM

## 2023-11-01 DIAGNOSIS — I2510 Atherosclerotic heart disease of native coronary artery without angina pectoris: Secondary | ICD-10-CM

## 2023-11-01 DIAGNOSIS — I480 Paroxysmal atrial fibrillation: Secondary | ICD-10-CM | POA: Diagnosis not present

## 2023-11-01 MED ORDER — LISINOPRIL 20 MG PO TABS: 20.0000 mg | ORAL_TABLET | Freq: Every day | ORAL | 3 refills | Status: DC

## 2023-11-01 MED ORDER — METOPROLOL SUCCINATE ER 100 MG PO TB24: 100.0000 mg | ORAL_TABLET | Freq: Every day | ORAL | 3 refills | Status: DC

## 2023-11-01 NOTE — Patient Instructions (Signed)
 Medication Instructions:  Your physician recommends the following medication changes.  DECREASE: Toprol -XL (metoprolol  succinate) 100 mg daily  INCREASE: Lisinopril  20 mg daily   *If you need a refill on your cardiac medications before your next appointment, please call your pharmacy*   Lab Work: None ordered at this time  If you have labs (blood work) drawn today and your tests are completely normal, you will receive your results only by: MyChart Message (if you have MyChart) OR A paper copy in the mail If you have any lab test that is abnormal or we need to change your treatment, we will call you to review the results.   Testing/Procedures: None ordered at this time    Follow-Up: At [facility], you and your health needs are our priority.  As part of our continuing mission to provide you with exceptional heart care, we have created designated Provider Care Teams.  These Care Teams include your primary Cardiologist (physician) and Advanced Practice Providers (APPs -  Physician Assistants and Nurse Practitioners) who all work together to provide you with the care you need, when you need it.  We recommend signing up for the patient portal called MyChart.  Sign up information is provided on this After Visit Summary.  MyChart is used to connect with patients for Virtual Visits (Telemedicine).  Patients are able to view lab/test results, encounter notes, upcoming appointments, etc.  Non-urgent messages can be sent to your provider as well.   To learn more about what you can do with MyChart, go to [email or web address].    Your next appointment:   3 month(s)  Provider:   You may see Redell Cave, MD or one of the following Advanced Practice Providers on your designated Care Team:   Lonni Meager, NP Bernardino Bring, PA-C Cadence Franchester, PA-C Tylene Lunch, NP Barnie Hila, NP

## 2023-11-01 NOTE — Telephone Encounter (Signed)
Patient called and wanted you to be sure to put in his letter to the attorney that he can't go 7 days Xanax and Xtampza.

## 2023-11-01 NOTE — Progress Notes (Signed)
 Cardiology Office Note:    Date:  11/01/2023   ID:  Gabriel Hoffman, DOB 09/22/1956, MRN [id number]  PCP:  Orlean Alan HERO, FNP  Cardiologist:  Redell Cave, MD  Electrophysiologist:  None   Referring MD: Orlean Alan HERO, FNP   Chief Complaint  Patient presents with   Follow-up    Post cath 2 week follow up.  Patient reports stable intermittent chest pain and dyspnea on exertion.      History of Present Illness:    Gabriel Hoffman is a 67 y.o. male with a hx of Nonobstructive CAD (left heart cath 12/24 30% proximal LAD, 50% first diagonal), hypertension, hyperlipidemia, atrial fibrillation status post ablation in September 2020,  current smoker x50+ years who presents for follow-up.  Previously seen for CAD, underwent left heart cath showing nonobstructive CAD.  Toprol -XL increased to 100 mg twice daily, heart rates in the low 50s.  Endorses shortness of breath.  He still smokes.  BP elevated at home with systolics in the 160s.  Xarelto  for stroke prophylaxis, no bleeding issues.   Prior notes Cardiac monitor 09/2021 showed paroxysmal A-fib, 1% burden Echocardiogram 01/2020 normal systolic function, EF 55 to 60%, impaired relaxation, normal LA size.  Patient states having a heart attack in February 2020 while he was incarcerated.  He was taken to [facility] in [place] .  He states being there for 1 week, no procedures were performed and he was placed on meds.  In September of he states being taken to [facility] where he was diagnosed with atrial fibrillation.  He had an ablation and was started on Xarelto .  He recently followed up with a primary care provider in the area to establish care.  His blood pressures were elevated and he was started on hydrochlorothiazide  12.5 mg daily.  He is a current smoker and is working on quitting.    Past Medical History:  Diagnosis Date   Anginal pain (HCC)    Anxiety    Aortic atherosclerosis (HCC)     Arthritis    Atrial fibrillation (HCC)    a.) CHA2DS2-VASc Score = 2 (HTN, vascular disease/prior MI).  b.) Rate/rhythm maintained on metoprolol  succinate; chronically anticoagulated with rivaroxaban .   CAD (coronary artery disease)    Carotid artery stenosis    Cirrhosis (HCC) 06/15/2020   Complication of anesthesia    a.) seizure like activity with propofol  during colonoscopy   COPD (chronic obstructive pulmonary disease) (HCC)    Current use of long term anticoagulation    a.) Rivaroxaban    Diastolic dysfunction 02/25/2020   a.) TTE 02/25/2020: normal LV function; EF 55-65%; GLS -15.8%; G1DD.   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 C282Y and S65C were negative.   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   Thrombocytopenia [facility])     Past Surgical History:  Procedure Laterality Date   CARDIAC CATHETERIZATION     CATARACT EXTRACTION Bilateral    COLONOSCOPY     FRACTURE SURGERY Left    pins rods left wrist   HARDWARE REMOVAL Right 04/30/2021  Procedure: HARDWARE REMOVAL, Right tibia IM nail removal;  Surgeon: Kathlynn Sharper, MD;  Location: [facility] ORS;  Service: Orthopedics;  Laterality: Right;   HERNIA REPAIR Bilateral    LEG SURGERY Right    rod in leg   RIGHT/LEFT HEART CATH AND CORONARY ANGIOGRAPHY Bilateral 10/17/2023   Procedure: RIGHT/LEFT HEART CATH AND CORONARY ANGIOGRAPHY;  Surgeon: Darron Deatrice LABOR, MD;  Location: [facility] INVASIVE CV LAB;  Service: Cardiovascular;  Laterality: Bilateral;   TOTAL KNEE ARTHROPLASTY Right 10/02/2021   Procedure: TOTAL KNEE ARTHROPLASTY;  Surgeon: Kathlynn Sharper, MD;  Location: [facility] ORS;  Service:  Orthopedics;  Laterality: Right;   UPPER GI ENDOSCOPY      Current Medications: Current Meds  Medication Sig   ALPRAZolam  (XANAX ) 0.5 MG tablet Take 1 tablet (0.5 mg total) by mouth 3 (three) times daily as needed for anxiety. (Patient taking differently: Take 0.5 mg by mouth 3 (three) times daily.)   atorvastatin  (LIPITOR) 10 MG tablet Take 1 tablet (10 mg total) by mouth daily.   levothyroxine  (SYNTHROID ) 125 MCG tablet TAKE 1 TABLET DAILY.   nitroGLYCERIN  (NITROSTAT ) 0.4 MG SL tablet Place 1 tablet (0.4 mg total) under the tongue every 5 (five) minutes x 3 doses as needed for chest pain.   rivaroxaban  (XARELTO ) 20 MG TABS tablet Take 1 tablet (20 mg total) by mouth in the morning.   XTAMPZA  ER 9 MG C12A Take 9 mg by mouth daily.   [DISCONTINUED] lisinopril  (ZESTRIL ) 10 MG tablet Take 1 tablet (10 mg total) by mouth daily.   [DISCONTINUED] metoprolol  succinate (TOPROL -XL) 100 MG 24 hr tablet Take 100 mg by mouth in the morning and at bedtime. Take with or immediately following a meal.     Allergies:   Codeine and Propofol    Social History   Socioeconomic History   Marital status: Single    Spouse name: Not on file   Number of children: Not on file   Years of education: Not on file   Highest education level: Not on file  Occupational History   Not on file  Tobacco Use   Smoking status: Every Day    Current packs/day: 1.00    Average packs/day: 1 pack/day for 50.0 years (50.0 ttl pk-yrs)    Types: Cigarettes   Smokeless tobacco: Never   Tobacco comments:    Smokes about 4 cigarettes daily  Vaping Use   Vaping status: Never Used  Substance and Sexual Activity   Alcohol use: Not Currently    Comment: Quit   Drug use: Never   Sexual activity: Not on file  Other Topics Concern   Not on file  Social History Narrative   Alcohol/beer once/twice a month; 1/2 ppd; used to work in holiday representative. In [place]; with sister.    Social Drivers of Corporate Investment Banker  Strain: Not on file  Food Insecurity: Not on file  Transportation Needs: Not on file  Physical Activity: Not on file  Stress: Not on file  Social Connections: Not on file     Family History: The patient's family history includes COPD in his mother; Cancer in his mother and sister.  ROS:   Please see the history of present illness.     All other systems reviewed and are negative.  EKGs/Labs/Other Studies Reviewed:    The following studies were reviewed today:   EKG Interpretation Date/Time:  Tuesday November 01 2023 15:56:52 EST Ventricular Rate:  52 PR Interval:  162 QRS Duration:  72 QT Interval:  430 QTC Calculation: 399 R Axis:   18  Text Interpretation: Sinus bradycardia Confirmed by Darliss Rogue ([id number]) on 11/01/2023 3:58:29 PM    Recent Labs: 06/30/2023: ALT 31 09/22/2023: BUN 16; Creatinine, Ser 1.01; Hemoglobin 14.4; Magnesium  2.0; Platelets 102; Potassium 4.4; Sodium 137; TSH 1.510  Recent Lipid Panel    Component Value Date/Time   CHOL 105 06/30/2023 1419   TRIG 397 (H) 06/30/2023 1419   HDL 26 (L) 06/30/2023 1419   CHOLHDL 4.0 06/30/2023 1419   LDLCALC 23 06/30/2023 1419    Physical Exam:    VS:  BP (!) 160/82 (BP Location: Left Arm, Patient Position: Sitting, Cuff Size: Normal)   Pulse (!) 52   Ht 5' 5 (1.651 m)   Wt 167 lb (75.8 kg)   SpO2 96%   BMI 27.79 kg/m     Wt Readings from Last 3 Encounters:  11/01/23 167 lb (75.8 kg)  10/24/23 167 lb 12.8 oz (76.1 kg)  10/17/23 166 lb 12.8 oz (75.7 kg)     GEN:  Well nourished, well developed in no acute distress HEENT: Normal NECK: No JVD; No carotid bruits CARDIAC: RRR, no murmurs, rubs, gallops RESPIRATORY:  Clear to auscultation without rales ABDOMEN: Soft, non-tender, non-distended MUSCULOSKELETAL:  No edema; No deformity  SKIN: Warm and dry` NEUROLOGIC:  Alert and oriented x 3 PSYCHIATRIC:  Normal affect   ASSESSMENT:    1. Coronary artery disease involving native coronary  artery of native heart, unspecified whether angina present   2. Paroxysmal A-fib (HCC)   3. Primary hypertension   4. Smoking   5. Essential hypertension    PLAN:    In order of problems listed above:  Nonobstructive CAD, left heart cath 12/24 30% proximal LAD, 50% first diagonal. continue Xarelto , Lipitor 10 mg daily.  LDL at goal.  Reduce Toprol -XL to 100 mg daily.  Consider Imdur if patient has angina. Paroxysmal atrial fibrillation s/p ablation 9/ 2020. Currently in sinus rhythm/sinus bradycardia, heart rate 52.  Reduce Toprol -XL to 100 mg qd, Xarelto .   Hypertension, BP elevated.  Increase lisinopril  to 20 mg daily.  Continue Toprol -XL 100 mg daily.  Titrate lisinopril  if BP elevated at follow-up visit..  Current smoker, smoking cessation advised.  Follow-up in 3 months.  Medication Adjustments/Labs and Tests Ordered: Current medicines are reviewed at length with the patient today.  Concerns regarding medicines are outlined above.  Orders Placed This Encounter  Procedures   EKG 12-Lead    Meds ordered this encounter  Medications   lisinopril  (ZESTRIL ) 20 MG tablet    Sig: Take 1 tablet (20 mg total) by mouth daily.    Dispense:  90 tablet    Refill:  3   metoprolol  succinate (TOPROL -XL) 100 MG 24 hr tablet    Sig: Take 1 tablet (100 mg total) by mouth daily. Take with or immediately following a meal.    Dispense:  90 tablet    Refill:  3     Patient Instructions  Medication Instructions:  Your physician recommends the following medication changes.  DECREASE: Toprol -XL (metoprolol  succinate) 100 mg daily  INCREASE: Lisinopril  20 mg daily   *If you need a refill on your cardiac medications before your next appointment, please call your pharmacy*   Lab Work: None ordered at this time  If you have labs (blood work) drawn today and your tests are completely normal, you will receive your results only by: MyChart Message (if you have MyChart) OR A paper copy in the  mail If you have any lab test that is abnormal or we need to change your treatment, we will call you to review the results.   Testing/Procedures: None ordered at this time    Follow-Up: At [facility], you and your health needs are our priority.  As part of our continuing mission to provide you with exceptional heart care, we have created designated Provider Care Teams.  These Care Teams include your primary Cardiologist (physician) and Advanced Practice Providers (APPs -  Physician Assistants and Nurse Practitioners) who all work together to provide you with the care you need, when you need it.  We recommend signing up for the patient portal called MyChart.  Sign up information is provided on this After Visit Summary.  MyChart is used to connect with patients for Virtual Visits (Telemedicine).  Patients are able to view lab/test results, encounter notes, upcoming appointments, etc.  Non-urgent messages can be sent to your provider as well.   To learn more about what you can do with MyChart, go to [email or web address].    Your next appointment:   3 month(s)  Provider:   You may see Redell Cave, MD or one of the following Advanced Practice Providers on your designated Care Team:   Lonni Meager, NP Bernardino Bring, PA-C Cadence Franchester, PA-C Tylene Lunch, NP Barnie Hila, NP   Signed, Redell Cave, MD  11/01/2023 5:15 PM    [facility]

## 2023-11-08 ENCOUNTER — Ambulatory Visit: Payer: 59 | Attending: Medical | Admitting: Cardiology

## 2023-11-08 ENCOUNTER — Encounter: Payer: Self-pay | Admitting: Cardiology

## 2023-11-08 VITALS — BP 139/78 | HR 50 | Ht 65.0 in | Wt 167.0 lb

## 2023-11-08 DIAGNOSIS — I491 Atrial premature depolarization: Secondary | ICD-10-CM

## 2023-11-08 DIAGNOSIS — I10 Essential (primary) hypertension: Secondary | ICD-10-CM

## 2023-11-08 DIAGNOSIS — I480 Paroxysmal atrial fibrillation: Secondary | ICD-10-CM | POA: Diagnosis not present

## 2023-11-08 DIAGNOSIS — D6869 Other thrombophilia: Secondary | ICD-10-CM

## 2023-11-08 NOTE — Patient Instructions (Signed)
 Medication Instructions:  Your physician recommends that you continue on your current medications as directed. Please refer to the Current Medication list given to you today.  *If you need a refill on your cardiac medications before your next appointment, please call your pharmacy*  Follow-Up: At [facility], you and your health needs are our priority.  As part of our continuing mission to provide you with exceptional heart care, we have created designated Provider Care Teams.  These Care Teams include your primary Cardiologist (physician) and Advanced Practice Providers (APPs -  Physician Assistants and Nurse Practitioners) who all work together to provide you with the care you need, when you need it.  Your next appointment:   6 month  Provider:   Fonda Kitty, MD

## 2023-11-08 NOTE — Progress Notes (Signed)
 Electrophysiology Office Note:   Date:  11/09/2023  ID:  Gabriel Hoffman, DOB Mar 26, 1956, MRN [id number]  Primary Cardiologist: Redell Cave, MD Primary Heart Failure: None Electrophysiologist: Fonda Kitty, MD      History of Present Illness:   Gabriel Hoffman is a 68 y.o. male with h/o Nonobstructive CAD (left heart cath 12/24 30% proximal LAD, 50% first diagonal), hypertension, hyperlipidemia, atrial fibrillation status post ablation in September 2020,  current smoker who is being seen today for evaluation of PACs.  Patient not quite sure why he has appointment with me today. He states that he is doing relatively well. He reports an occasional brief palpitation, described as fluttering, that occurs maybe once per week. He is not bothered by this. From an arrhythmia perspective, he thinks he has done very well since his ablation. Today, he has no new or acute complaints.   Review of systems complete and found to be negative unless listed in HPI.   EP Information / Studies Reviewed:    EKG is not ordered today. EKG from 11/01/23 reviewed which showed sinus bradycardia.      Zio 09/2023:  Conclusion Average heart rate 63, minimal heart rate 42, max heart rate 182. Atrial fibrillation/atrial flutter noted, 2% burden. Frequent PACs 8.6% burden. Occasional PVCs 1.1% burden. Nonsustained VT lasting 6 beats. Paroxysmal nonsustained SVT noted.  LHC 10/2023:    1st Diag lesion is 50% stenosed.   Prox LAD lesion is 30% stenosed.   The left ventricular systolic function is normal.   LV end diastolic pressure is mildly elevated.   The left ventricular ejection fraction is 50-55% by visual estimate.   1.  Mild to moderate nonobstructive one-vessel coronary artery disease involving the proximal LAD/diagonal bifurcation. 2.  Low normal LV systolic function. 3.  Mildly to moderately elevated left ventricular end-diastolic pressure at 20 mmHg likely due to diastolic  dysfunction.  Nuclear Stress 07/14/22:    The study is low risk.   No ST deviation was noted.   LV perfusion is normal with no evidence for ischemia.   Left ventricular function is normal. calculated LVEF = 69%   LAD and RCA calcifications noted.  Echo 02/05/20:  1. Left ventricular ejection fraction, by estimation, is 55 to 60%. The left ventricle has normal function. The left ventricle has no regional wall motion abnormalities. Left ventricular diastolic parameters are consistent with Grade I diastolic dysfunction (impaired relaxation). The average left ventricular global longitudinal strain is -15.8 %.  2. Right ventricular systolic function is normal. The right ventricular size is normal. There is normal pulmonary artery systolic pressure.  3. The mitral valve is myxomatous. Mild mitral valve regurgitation. No evidence of mitral stenosis.  4. The aortic valve is tricuspid. Aortic valve regurgitation is not visualized. No aortic stenosis is present.    Risk Assessment/Calculations:    CHA2DS2-VASc Score = 2   This indicates a 2.2% annual risk of stroke. The patient's score is based upon: CHF History: 0 HTN History: 1 Diabetes History: 0 Stroke History: 0 Vascular Disease History: 0 Age Score: 1 Gender Score: 0             Physical Exam:   VS:  BP 139/78 (BP Location: Left Arm, Patient Position: Sitting, Cuff Size: Normal)   Pulse (!) 50   Ht 5' 5 (1.651 m)   Wt 167 lb (75.8 kg)   SpO2 97%   BMI 27.79 kg/m    Wt Readings from Last 3 Encounters:  11/08/23  167 lb (75.8 kg)  11/01/23 167 lb (75.8 kg)  10/24/23 167 lb 12.8 oz (76.1 kg)     GEN: Well nourished, well developed in no acute distress NECK: No JVD CARDIAC: Bradycardic, regular rhythm RESPIRATORY:  Clear to auscultation without rales, wheezing or rhonchi  ABDOMEN: Soft, non-tender, non-distended EXTREMITIES:  No edema; No deformity   ASSESSMENT AND PLAN:   Gabriel Hoffman is a 68 y.o. male with  h/o nonobstructive CAD (left heart cath 12/24 30% proximal LAD, 50% first diagonal), hypertension, hyperlipidemia, atrial fibrillation status post ablation in September 2020,  current smoker who is being seen today for evaluation of PACs.  #PACs: 8.6% burden. Appears to be minimally symptomatic or even asymptomatic. As long as PACs are not leading to high burden of AF or AFL, then would not be aggressive in suppression due to overall lack of significant symptoms. - Continue metoprolol  XL 100mg  once daily.  #Paroxysmal atrial fibrillation: S/p ablation - Doing well. Very low burden. - Continue metoprolol  XL 100mg  once daily. - Continue Xarelto .  #Secondary hypercoagulable state due to atrial fibrillation:  -CHADSVASC score of at least 2. -Continue Xarelto   #Hypertension -Above goal today.  Recommend checking blood pressures 1-2 times per week at home and recording the values.  Recommend bringing these recordings to the primary care physician.    Signed, Fonda Kitty, MD

## 2023-11-13 ENCOUNTER — Other Ambulatory Visit: Payer: Self-pay | Admitting: Family

## 2023-11-14 ENCOUNTER — Other Ambulatory Visit: Payer: Self-pay | Admitting: Family

## 2023-11-14 ENCOUNTER — Telehealth: Payer: Self-pay | Admitting: Family

## 2023-11-14 NOTE — Telephone Encounter (Signed)
 Patient left VM wanting the letter for his attorney ASAP. Please advise.

## 2023-11-15 ENCOUNTER — Other Ambulatory Visit: Payer: Self-pay | Admitting: Family

## 2023-11-15 ENCOUNTER — Encounter: Payer: Self-pay | Admitting: Internal Medicine

## 2023-11-15 ENCOUNTER — Encounter: Payer: Self-pay | Admitting: Family

## 2023-11-15 NOTE — Telephone Encounter (Signed)
 Letter ready for pick up, patient informed via mychart

## 2023-11-16 ENCOUNTER — Telehealth: Payer: Self-pay | Admitting: Family

## 2023-11-16 ENCOUNTER — Ambulatory Visit: Payer: 59 | Attending: Medical

## 2023-11-16 DIAGNOSIS — R0609 Other forms of dyspnea: Secondary | ICD-10-CM | POA: Diagnosis not present

## 2023-11-16 LAB — ECHOCARDIOGRAM COMPLETE
AV Mean grad: 2 mm[Hg]
AV Peak grad: 4 mm[Hg]
Ao pk vel: 1 m/s
Area-P 1/2: 3.65 cm2
S' Lateral: 4.2 cm

## 2023-11-16 MED ORDER — XTAMPZA ER 9 MG PO C12A: 9.0000 mg | EXTENDED_RELEASE_CAPSULE | Freq: Every day | ORAL | 0 refills | Status: DC

## 2023-11-16 NOTE — Telephone Encounter (Signed)
 Pt would like a refill of his XTAMPZA  sent to [facility]   Pt would also like to know the status of his paper that is supposed to go to his lawyer  Thank you

## 2023-11-25 ENCOUNTER — Ambulatory Visit: Payer: 59 | Admitting: Medical

## 2023-12-08 ENCOUNTER — Other Ambulatory Visit: Payer: Self-pay | Admitting: Family

## 2023-12-08 DIAGNOSIS — E039 Hypothyroidism, unspecified: Secondary | ICD-10-CM

## 2023-12-13 ENCOUNTER — Other Ambulatory Visit: Payer: Self-pay | Admitting: Family

## 2023-12-16 ENCOUNTER — Encounter: Payer: Self-pay | Admitting: Internal Medicine

## 2023-12-19 ENCOUNTER — Ambulatory Visit
Admission: RE | Admit: 2023-12-19 | Discharge: 2023-12-19 | Disposition: A | Discharge status: Home / Self Care | Payer: 59 | Source: Ambulatory Visit | Attending: Internal Medicine | Admitting: Internal Medicine

## 2023-12-19 DIAGNOSIS — K769 Liver disease, unspecified: Secondary | ICD-10-CM | POA: Diagnosis not present

## 2023-12-19 DIAGNOSIS — R932 Abnormal findings on diagnostic imaging of liver and biliary tract: Secondary | ICD-10-CM | POA: Diagnosis not present

## 2023-12-19 DIAGNOSIS — K7460 Unspecified cirrhosis of liver: Secondary | ICD-10-CM | POA: Diagnosis not present

## 2023-12-19 DIAGNOSIS — Z9189 Other specified personal risk factors, not elsewhere classified: Secondary | ICD-10-CM | POA: Insufficient documentation

## 2023-12-19 DIAGNOSIS — K7689 Other specified diseases of liver: Secondary | ICD-10-CM | POA: Diagnosis not present

## 2023-12-26 ENCOUNTER — Encounter: Payer: Self-pay | Admitting: Internal Medicine

## 2023-12-26 ENCOUNTER — Inpatient Hospital Stay (HOSPITAL_BASED_OUTPATIENT_CLINIC_OR_DEPARTMENT_OTHER): Payer: 59 | Admitting: Internal Medicine

## 2023-12-26 ENCOUNTER — Inpatient Hospital Stay: Payer: 59 | Attending: Internal Medicine

## 2023-12-26 DIAGNOSIS — D696 Thrombocytopenia, unspecified: Secondary | ICD-10-CM | POA: Diagnosis not present

## 2023-12-26 DIAGNOSIS — M549 Dorsalgia, unspecified: Secondary | ICD-10-CM | POA: Insufficient documentation

## 2023-12-26 DIAGNOSIS — I2510 Atherosclerotic heart disease of native coronary artery without angina pectoris: Secondary | ICD-10-CM | POA: Insufficient documentation

## 2023-12-26 DIAGNOSIS — Z9189 Other specified personal risk factors, not elsewhere classified: Secondary | ICD-10-CM

## 2023-12-26 DIAGNOSIS — B1920 Unspecified viral hepatitis C without hepatic coma: Secondary | ICD-10-CM | POA: Insufficient documentation

## 2023-12-26 DIAGNOSIS — G8929 Other chronic pain: Secondary | ICD-10-CM | POA: Insufficient documentation

## 2023-12-26 DIAGNOSIS — K769 Liver disease, unspecified: Secondary | ICD-10-CM | POA: Diagnosis not present

## 2023-12-26 DIAGNOSIS — Z7901 Long term (current) use of anticoagulants: Secondary | ICD-10-CM | POA: Insufficient documentation

## 2023-12-26 DIAGNOSIS — I480 Paroxysmal atrial fibrillation: Secondary | ICD-10-CM | POA: Diagnosis not present

## 2023-12-26 DIAGNOSIS — K7460 Unspecified cirrhosis of liver: Secondary | ICD-10-CM | POA: Insufficient documentation

## 2023-12-26 DIAGNOSIS — M2550 Pain in unspecified joint: Secondary | ICD-10-CM | POA: Insufficient documentation

## 2023-12-26 LAB — IRON AND TIBC
Iron: 116 ug/dL (ref 45–182)
Saturation Ratios: 26 % (ref 17.9–39.5)
TIBC: 451 ug/dL — ABNORMAL HIGH (ref 250–450)
UIBC: 335 ug/dL

## 2023-12-26 LAB — CMP (CANCER CENTER ONLY)
ALT: 29 U/L (ref 0–44)
AST: 23 U/L (ref 15–41)
Albumin: 3.6 g/dL (ref 3.5–5.0)
Alkaline Phosphatase: 62 U/L (ref 38–126)
Anion gap: 7 (ref 5–15)
BUN: 19 mg/dL (ref 8–23)
CO2: 26 mmol/L (ref 22–32)
Calcium: 9.1 mg/dL (ref 8.9–10.3)
Chloride: 104 mmol/L (ref 98–111)
Creatinine: 1.07 mg/dL (ref 0.61–1.24)
GFR, Estimated: >60 mL/min (ref >60)
Glucose, Bld: 121 mg/dL — ABNORMAL HIGH (ref 70–99)
Potassium: 4.5 mmol/L (ref 3.5–5.1)
Sodium: 137 mmol/L (ref 135–145)
Total Bilirubin: 1.1 mg/dL (ref 0.0–1.2)
Total Protein: 7.4 g/dL (ref 6.5–8.1)

## 2023-12-26 LAB — CBC WITH DIFFERENTIAL (CANCER CENTER ONLY)
Abs Immature Granulocytes: 0.04 10*3/uL (ref 0.00–0.07)
Basophils Absolute: 0.1 10*3/uL (ref 0.0–0.1)
Basophils Relative: 1 %
Eosinophils Absolute: 0.6 10*3/uL — ABNORMAL HIGH (ref 0.0–0.5)
Eosinophils Relative: 6 %
HCT: 41.7 % (ref 39.0–52.0)
Hemoglobin: 14.3 g/dL (ref 13.0–17.0)
Immature Granulocytes: 1 %
Lymphocytes Relative: 21 %
Lymphs Abs: 1.8 10*3/uL (ref 0.7–4.0)
MCH: 31.7 pg (ref 26.0–34.0)
MCHC: 34.3 g/dL (ref 30.0–36.0)
MCV: 92.5 fL (ref 80.0–100.0)
Monocytes Absolute: 1.1 10*3/uL — ABNORMAL HIGH (ref 0.1–1.0)
Monocytes Relative: 13 %
Neutro Abs: 5.1 10*3/uL (ref 1.7–7.7)
Neutrophils Relative %: 58 %
Platelet Count: 104 10*3/uL — ABNORMAL LOW (ref 150–400)
RBC: 4.51 MIL/uL (ref 4.22–5.81)
RDW: 13.2 % (ref 11.5–15.5)
WBC Count: 8.7 10*3/uL (ref 4.0–10.5)
nRBC: 0 % (ref 0.0–0.2)

## 2023-12-26 LAB — FERRITIN: Ferritin: 21 ng/mL — ABNORMAL LOW (ref 24–336)

## 2023-12-26 NOTE — Progress Notes (Signed)
 [facility] CONSULT NOTE  Patient Care Team: Miki Kins, FNP as PCP - General (Family Medicine) Debbe Odea, MD as PCP - Cardiology (Cardiology) Nobie Putnam, MD as PCP - Electrophysiology (Cardiology) Earna Coder, MD as Consulting Physician (Oncology)  CHIEF COMPLAINTS/PURPOSE OF CONSULTATION: Hemochromatosis.  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  Two copies of H63D were identified.  C282Y and S65C were negative;Elevated Ferritin/I sat-88%;  SEP 2021- s/p Phlebotomy x3  # Elevated AFP- 73 [SEP 2021]; NOV 2021- MRI- liver NEG;  # Cirrhosis/ HEP C [started end of sep,2021 x 12 weeks; Dr.Wohl]  # CAD- on xarelto.   HISTORY OF PRESENTING ILLNESS: Ambulating independently. Alone.   Gabriel Hoffman 68 y.o.  male history of hepatitis C/cirrhosis splenomegaly and history of hereditary hemochromatosis/elevated iron saturation/ferritin is here for follow-up/review results of the liver US.  Patient states that his energy is low. Appetite is good. Patient has occasional tenderness in his liver right abdomen. Denies any  denies any worsening vomiting diarrhea.  Denies any leg swelling.  Chronic lower extremity cramps.  Not any worse.  Patient continues to have chronic back pain joint pains.  Joint pains back pain not any worse.   Unfortunately continues to smoke.  Review of Systems  Constitutional:  Positive for malaise/fatigue. Negative for chills, diaphoresis, fever and weight loss.  HENT:  Negative for nosebleeds and sore throat.   Eyes:  Negative for double vision.  Respiratory:  Negative for cough, hemoptysis, sputum production, shortness of breath and wheezing.   Cardiovascular:  Negative for chest pain, palpitations, orthopnea and leg swelling.  Gastrointestinal:  Negative for abdominal pain, blood in stool,  constipation, diarrhea, heartburn, melena, nausea and vomiting.  Genitourinary:  Negative for dysuria, frequency and urgency.  Musculoskeletal:  Positive for back pain, joint pain and myalgias.  Skin: Negative.  Negative for itching and rash.  Neurological:  Negative for dizziness, tingling, focal weakness, weakness and headaches.  Endo/Heme/Allergies:  Does not bruise/bleed easily.  Psychiatric/Behavioral:  Negative for depression. The patient is not nervous/anxious and does not have insomnia.     MEDICAL HISTORY:  Past Medical History:  Diagnosis Date   Anginal pain (HCC)    Anxiety    Aortic atherosclerosis (HCC)    Arthritis    Atrial fibrillation (HCC)    a.) CHA2DS2-VASc Score = 2 (HTN, vascular disease/prior MI).  b.) Rate/rhythm maintained on metoprolol succinate; chronically anticoagulated with rivaroxaban.   CAD (coronary artery disease)    Carotid artery stenosis    Cirrhosis (HCC) 06/15/2020   Complication of anesthesia    a.) seizure like activity with propofol during colonoscopy   COPD (chronic obstructive pulmonary disease) (HCC)    Current use of long term anticoagulation    a.) Rivaroxaban   Diastolic dysfunction 02/25/2020   a.) TTE 02/25/2020: normal LV function; EF 55-65%; GLS -15.8%; G1DD.   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 C282Y and S65C were negative.   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   Thrombocytopenia (HCC)     SURGICAL HISTORY: Past Surgical History:  Procedure Laterality Date   CARDIAC CATHETERIZATION     CATARACT EXTRACTION Bilateral    COLONOSCOPY     FRACTURE SURGERY Left    pins rods  left wrist   HARDWARE REMOVAL Right 04/30/2021   Procedure: HARDWARE REMOVAL, Right tibia IM nail removal;  Surgeon: Kennedy Bucker, MD;  Location: [facility] ORS;  Service: Orthopedics;  Laterality: Right;   HERNIA REPAIR Bilateral    LEG SURGERY Right    rod in leg   RIGHT/LEFT HEART CATH AND CORONARY ANGIOGRAPHY Bilateral 10/17/2023   Procedure: RIGHT/LEFT HEART CATH AND CORONARY ANGIOGRAPHY;  Surgeon: Iran Ouch, MD;  Location: [facility] INVASIVE CV LAB;  Service: Cardiovascular;  Laterality: Bilateral;   TOTAL KNEE ARTHROPLASTY Right 10/02/2021   Procedure: TOTAL KNEE ARTHROPLASTY;  Surgeon: Kennedy Bucker, MD;  Location: [facility] ORS;  Service: Orthopedics;  Laterality: Right;   UPPER GI ENDOSCOPY      SOCIAL HISTORY: Social History   Socioeconomic History   Marital status: Single    Spouse name: Not on file   Number of children: Not on file   Years of education: Not on file   Highest education level: Not on file  Occupational History   Not on file  Tobacco Use   Smoking status: Every Day    Current packs/day: 1.00    Average packs/day: 1 pack/day for 50.0 years (50.0 ttl pk-yrs)    Types: Cigarettes   Smokeless tobacco: Never   Tobacco comments:    Smokes about 4 cigarettes daily  Vaping Use   Vaping status: Never Used  Substance and Sexual Activity   Alcohol use: Not Currently    Comment: Quit   Drug use: Never   Sexual activity: Not on file  Other Topics Concern   Not on file  Social History Narrative   Alcohol/beer once/twice a month; 1/2 ppd; used to work in [employer]. In [place]; with sister.    Social Drivers of Corporate investment banker Strain: Not on file  Food Insecurity: Not on file  Transportation Needs: Not on file  Physical Activity: Not on file  Stress: Not on file  Social Connections: Not on file  Intimate Partner Violence: Not on file    FAMILY HISTORY: Family History  Problem Relation Age of Onset   Cancer Mother    COPD Mother     Cancer Sister     ALLERGIES:  is allergic to codeine and propofol.  MEDICATIONS:  Current Outpatient Medications  Medication Sig Dispense Refill   ALPRAZolam (XANAX) 0.5 MG tablet Take 1 tablet (0.5 mg total) by mouth 3 (three) times daily as needed for anxiety. (Patient taking differently: Take 0.5 mg by mouth 3 (three) times daily.) 90 tablet 3   atorvastatin (LIPITOR) 10 MG tablet Take 1 tablet (10 mg total) by mouth daily. 90 tablet 3   levothyroxine (SYNTHROID) 125 MCG tablet TAKE 1 TABLET DAILY. 90 tablet 0   lisinopril (ZESTRIL) 20 MG tablet Take 1 tablet (20 mg total) by mouth daily. 90 tablet 3   metoprolol succinate (TOPROL-XL) 100 MG 24 hr tablet Take 1 tablet (100 mg total) by mouth daily. Take with or immediately following a meal. 90 tablet 3   nitroGLYCERIN (NITROSTAT) 0.4 MG SL tablet Place 1 tablet (0.4 mg total) under the tongue every 5 (five) minutes x 3 doses as needed for chest pain. 25 tablet 2   rivaroxaban (XARELTO) 20  MG TABS tablet Take 1 tablet (20 mg total) by mouth in the morning. 90 tablet 3   XTAMPZA ER 9 MG C12A Take 9 mg by mouth daily. 30 capsule 0   No current facility-administered medications for this visit.      PHYSICAL EXAMINATION:   Vitals:   12/26/23 1304  BP: 99/63  Pulse: 63  Resp: 17  Temp: 97.7 F (36.5 C)  SpO2: 94%    Filed Weights   12/26/23 1304  Weight: 176 lb (79.8 kg)     Physical Exam HENT:     Head: Normocephalic and atraumatic.     Mouth/Throat:     Pharynx: No oropharyngeal exudate.  Eyes:     Pupils: Pupils are equal, round, and reactive to light.  Cardiovascular:     Rate and Rhythm: Normal rate and regular rhythm.  Pulmonary:     Effort: No respiratory distress.     Breath sounds: No wheezing.     Comments: Decreased air entry bilaterally.  Abdominal:     General: Bowel sounds are normal. There is no distension.     Palpations: Abdomen is soft. There is no mass.     Tenderness: There is no abdominal  tenderness. There is no guarding or rebound.  Musculoskeletal:        General: No tenderness. Normal range of motion.     Cervical back: Normal range of motion and neck supple.  Skin:    General: Skin is warm.  Neurological:     Mental Status: He is alert and oriented to person, place, and time.  Psychiatric:        Mood and Affect: Affect normal.     LABORATORY DATA:  I have reviewed the data as listed Lab Results  Component Value Date   WBC 8.7 12/26/2023   HGB 14.3 12/26/2023   HCT 41.7 12/26/2023   MCV 92.5 12/26/2023   PLT 104 (L) 12/26/2023   Recent Labs    02/23/23 0914 02/23/23 0914 03/17/23 0944 06/30/23 1419 09/22/23 0905 12/26/23 1258  NA 139   < > 140 142 137 137  K 4.3  --  4.3 4.4 4.4 4.5  CL 107  --  104 107* 103 104  CO2 25  --  20 20 23 26   GLUCOSE 99   < > 89 113* 103* 121*  BUN 16   < > 20 19 16 19   CREATININE 0.94  --  1.02 0.97 1.01 1.07  CALCIUM 9.4  --  9.4 9.5 9.8 9.1  GFRNONAA >60  --   --   --   --  >60  PROT 8.0  --  7.5 7.1  --  7.4  ALBUMIN 4.0  --  4.1 4.0  --  3.6  AST 21  --  19 28  --  23  ALT 20  --  23 31  --  29  ALKPHOS 71  --  91 76  --  62  BILITOT 0.9  --  0.8 0.9  --  1.1   < > = values in this interval not displayed.     US ABDOMEN LIMITED RUQ (LIVER/GB) Result Date: 12/19/2023 : PROCEDURE: ULTRASOUND ABDOMEN LIMITED HISTORY: Patient is a 68 y/o  M with cirrhosis; HCC surveillaince. COMPARISON: U/S RUQ 06/16/2023, MRI abdomen 03/03/2023, U/S abdomen 02/23/2023. TECHNIQUE: Two-dimensional grayscale and color Doppler ultrasound of the limited abdomen was performed. FINDINGS: The liver demonstrates a coarsened and nodular echotexture without intrahepatic biliary dilatation. There  is a hypoechoic lesion measuring 1.0 x 0.9 cm within the left lobe. There is normal hepatopetal flow visualized within the main portal vein. The gallbladder demonstrates normal anechoic echotexture without pericholecystic fluid or wall thickening.  There are no gallstones. The common bile duct measures 0.5 cm. Negative sonographic Murphy's sign. IMPRESSION: 1. Coarsened, nodular hepatic echotexture, suggestive of cirrhotic changes. Hypoechoic lesion measuring 1.0 cm within the left lobe. This remains stable with prior imaging from August 2024. Thank you for allowing [place] to assist in the care of this patient. Electronically Signed   By: Lestine Box M.D.   On: 12/19/2023 20:19    Hemochromatosis, hereditary (HCC) # Hereditary hemochromatosis-[Elevated iron saturation-iron studies; unclear if patient has organ involvement-given the hepatitis C/cirrhosis]- s/p phelbtomy x3.  NOV 2024- ferritin-LOW.  HOLD off phlebotomy at this time.   #Thrombocytopenia-platelets 107   [baseline 100] -secondary to hypersplenism cirrhosis-monitor closely.  Especially patient on Xarelto.  # Elevated AFP -question secondary cirrhosis; MRI liver with contrast- MAY 5th, 2024 Cirrhosis; limited by respiratory motion artifact throughout. Within this limitation, no hepatic masses are identified.  [place] AUG 2024- Findings consistent with cirrhosis. 1.1 x 1.1 x 1.3 cm hypoechoic area seen in the medial left lobe liver unchanged compared to prior ultrasound of February 23, 2023. FEB 17th, 2025- Coarsened, nodular hepatic echotexture, suggestive of cirrhotic changes. Hypoechoic lesion measuring 1.0 cm within the left lobe. This remains stable with prior imaging from August 2024. Will repeat MRI liver in 6 months.   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 Defer to [facility] [Dr.Wohl]. stable  # Active smoker- trying to quit/ Recommend quitting smoking/LCSP [MAY 2024]- stable.   # Paroxysmal atrial fibrillation [Dr.Etang] s/p ablation September 2020 at [facility]. on BB , Xarelto-discussed watching out for bleeding while on Xarelto. stable  ? Phlebotomy  # DISPOSITION: # follow up  in 6 months- MD: cbc/cmp; AFP- iron studies; ferritin- MRI liver-Dr.B   All questions  were answered. The patient knows to call the clinic with any problems, questions or concerns.   Earna Coder, MD 12/26/2023 1:35 PM

## 2023-12-26 NOTE — Progress Notes (Signed)
 Energy is low. Appetite is good. Has occasional tenderness in his liver right abdomen. Some headaches.

## 2023-12-26 NOTE — Assessment & Plan Note (Addendum)
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  NOV 2024- ferritin-LOW.  HOLD off phlebotomy at this time.   #Thrombocytopenia-platelets 107   [baseline 100] -secondary to hypersplenism cirrhosis-monitor closely.  Especially patient on Xarelto.  # Elevated AFP -question secondary cirrhosis; MRI liver with contrast- MAY 5th, 2024 Cirrhosis; limited by respiratory motion artifact throughout. Within this limitation, no hepatic masses are identified.  [place] AUG 2024- Findings consistent with cirrhosis. 1.1 x 1.1 x 1.3 cm hypoechoic area seen in the medial left lobe liver unchanged compared to prior ultrasound of February 23, 2023. FEB 17th, 2025- Coarsened, nodular hepatic echotexture, suggestive of cirrhotic changes. Hypoechoic lesion measuring 1.0 cm within the left lobe. This remains stable with prior imaging from August 2024. Will repeat MRI liver in 6 months.   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 Defer to [facility] [Dr.Wohl]. stable  # Active smoker- trying to quit/ Recommend quitting smoking/LCSP [MAY 2024]- stable.   # Paroxysmal atrial fibrillation [Dr.Etang] s/p ablation September 2020 at [facility]. on BB , Xarelto-discussed watching out for bleeding while on Xarelto. stable  ? Phlebotomy  # DISPOSITION: # follow up  in 6 months- MD: cbc/cmp; AFP- iron studies; ferritin- MRI liver-Dr.B

## 2023-12-27 LAB — AFP TUMOR MARKER: AFP, Serum, Tumor Marker: 2.1 ng/mL (ref 0.0–8.4)

## 2024-01-03 ENCOUNTER — Other Ambulatory Visit: Payer: Self-pay | Admitting: Family

## 2024-01-09 ENCOUNTER — Other Ambulatory Visit: Payer: Self-pay | Admitting: Family

## 2024-01-11 ENCOUNTER — Other Ambulatory Visit: Payer: Self-pay | Admitting: Family

## 2024-01-11 MED ORDER — XTAMPZA ER 9 MG PO C12A: 9.0000 mg | EXTENDED_RELEASE_CAPSULE | Freq: Two times a day (BID) | ORAL | 0 refills | Status: DC

## 2024-01-20 ENCOUNTER — Encounter: Payer: Self-pay | Admitting: Internal Medicine

## 2024-01-23 ENCOUNTER — Encounter: Payer: Self-pay | Admitting: Medical

## 2024-01-23 ENCOUNTER — Ambulatory Visit: Payer: 59 | Attending: Medical | Admitting: Medical

## 2024-01-23 VITALS — BP 112/60 | HR 57 | Ht 65.0 in | Wt 169.0 lb

## 2024-01-23 DIAGNOSIS — Z87891 Personal history of nicotine dependence: Secondary | ICD-10-CM | POA: Diagnosis not present

## 2024-01-23 DIAGNOSIS — I10 Essential (primary) hypertension: Secondary | ICD-10-CM

## 2024-01-23 DIAGNOSIS — I2510 Atherosclerotic heart disease of native coronary artery without angina pectoris: Secondary | ICD-10-CM

## 2024-01-23 DIAGNOSIS — I480 Paroxysmal atrial fibrillation: Secondary | ICD-10-CM

## 2024-01-23 NOTE — Progress Notes (Signed)
 Cardiology Office Note:  .   Date:  01/23/2024  ID:  ERMIN PARISIEN, DOB Dec 13, 1955, MRN [id number] PCP: Miki Kins, FNP  [facility] Providers Cardiologist:  Debbe Odea, MD Electrophysiologist:  Nobie Putnam, MD {  History of Present Illness: Gabriel Hoffman is a 68 y.o. male with a hx of  hypertension, hyperlipidemia, atrial fibrillation status post ablation in September 2020, ?  MI in February 2020, current smoker x 50 years who presents for follow-up.   Patient reported having heart attack in February 2020 while he was incarcerated.  He was taken to [facility] in [facility].  He said he was here from week, no procedures were performed and he was placed on medications.  In September he reported being taken to [facility] where he was diagnosed with atrial fibrillation and started on Xarelto.Echocardiogram in April 2021 showed normal LVSF, EF 55 to 60%, impaired relaxation, normal LA size.  Cardiac monitor November 2022 showed paroxysmal A-fib with percent burden.  Chest CT lungs cancer screening in 2023 showed three-vessel CAD. Myoview Lexiscan in 07/2022 for chest pain  was low risk with no evidence of ischemia, LAD and RCA calcifications noted, LVEF 69%. Cardiac CTA in 09/2023 showed coronary calcium score of 715, 80th percentile for age and sex matched, moderate proximal LAD stenosis of 50 to 69%, mild RCA stenosis of 25%, minimal left circumflex stenosis less than 25%.  It was sent for FFR testing which showed significant stenosis in the ostial first diagonal D1 with FFR 0.79.  Proximal to mid LAD FFR 0.81.  Recommended cardiac cath.LHC showed nonobstructive CAD and Toprol was increased.   Patient saw EP on November 08, 2023 for PACs.  Prior heart monitor for palpitations showed normal sinus rhythm, 2% burden of A-fib/flutter, PAC burden of 8.6%.  Beta-blocker was continued.  Today, the patient reports every thing is going well. He  denies chest pain, SOB, lower leg edema, heart, palpitations, lightheadedness, dizziness, orthopnea or pnd. He is cold on Xarelto, but is still taking it. He denies bleeding issues. He is still smoking 4-5 cigarettes daily.  He mows yard and this keeps him active.     Studies Reviewed: .       Cardiac CTA 10/2023   IMPRESSION: 1. Coronary calcium score of 715. This was 88th percentile for age and sex matched control.   2. Normal coronary origin with right dominance.   3. Moderate proximal LAD stenosis (50-69%).   4. Mild RCA stenosis (25%).  Minimal LCx stenosis (<25%).   5. CAD-RADS 3. Moderate stenosis. Consider symptom-guided anti-ischemic pharmacotherapy as well as risk factor modification per guideline directed care.   6. Additional analysis with CT FFR will be submitted and reported separately. 1. Left Main:  No significant stenosis.   2. LAD: significant stenosis in ostial first diagonal, FFRct 0.79. Prox-mid LAD FFRct 0.81. 3. LCX: No significant stenosis.  FFRct 0.89 4. RCA: No significant stenosis.  FFRct 0.94   IMPRESSION: 1. CT FFR analysis showed significant stenosis in the ostial first diagonal (D1), FFRct 0.79.   2. Prox-mid LAD FFRct 0.81.   3.  Recommend cardiac catheterization.      Physical Exam:   VS:  BP 112/60 (BP Location: Left Arm)   Pulse (!) 57   Ht 5\' 5"  (1.651 m)   Wt 169 lb (76.7 kg)   SpO2 94%   BMI 28.12 kg/m    Wt Readings from Last 3  Encounters:  01/23/24 169 lb (76.7 kg)  12/26/23 176 lb (79.8 kg)  11/08/23 167 lb (75.8 kg)    GEN: Well nourished, well developed in no acute distress NECK: No JVD; No carotid bruits CARDIAC:  bradycardia, RR, no murmurs, rubs, gallops RESPIRATORY:  Clear to auscultation without rales, wheezing or rhonchi  ABDOMEN: Soft, non-tender, non-distended EXTREMITIES:  No edema; No deformity   ASSESSMENT AND PLAN: .    Nonobstructive CAD LHC 10/2023 showed nonobstructive CAD. He denies anginal  symptoms. No ASA given Xarelto. Continue Lipitor 10mg  daily, Toprol 100mg  daily, and SL NTG.  Paroxysmal Afib S/p ablation 07/2019 The patient is in Sinus bradycardia on exam. Continue Toprol 100mg  daily for rate control. Continue Xarelto for stroke ppx. He denies bleeding issues.   Tobacco use The patient has a long smoking history. He is still smoking 4 cigarettes daily. I will refer to pulmonology for COPD/tobacco use. Cessation advised.   HTN BP is good today, continue lisinopril 20mg  daily and Toprol 100mg  daily.     Dispo: Follow-up in 6 months  Signed, [name] David Stall, PA-C

## 2024-01-23 NOTE — Patient Instructions (Signed)
 Medication Instructions:  Your physician recommends that you continue on your current medications as directed. Please refer to the Current Medication list given to you today.   *If you need a refill on your cardiac medications before your next appointment, please call your pharmacy*   Lab Work: No labs ordered today    Testing/Procedures: No test ordered today    Follow-Up: At [facility], you and your health needs are our priority.  As part of our continuing mission to provide you with exceptional heart care, we have created designated Provider Care Teams.  These Care Teams include your primary Cardiologist (physician) and Advanced Practice Providers (APPs -  Physician Assistants and Nurse Practitioners) who all work together to provide you with the care you need, when you need it.  We recommend signing up for the patient portal called "MyChart".  Sign up information is provided on this After Visit Summary.  MyChart is used to connect with patients for Virtual Visits (Telemedicine).  Patients are able to view lab/test results, encounter notes, upcoming appointments, etc.  Non-urgent messages can be sent to your provider as well.   To learn more about what you can do with MyChart, go to [email or web address].    Your next appointment:   6 month(s)  Provider:   You may see Debbe Odea, MD or one of the following Advanced Practice Providers on your designated Care Team:   Nicolasa Ducking, NP Eula Listen, PA-C Cadence Fransico Michael, PA-C Charlsie Quest, NP Carlos Levering, NP

## 2024-01-27 ENCOUNTER — Encounter: Payer: Self-pay | Admitting: Family

## 2024-01-27 ENCOUNTER — Ambulatory Visit: Payer: 59 | Admitting: Family

## 2024-01-27 VITALS — BP 128/84 | HR 55 | Ht 65.0 in | Wt 173.4 lb

## 2024-01-27 DIAGNOSIS — E538 Deficiency of other specified B group vitamins: Secondary | ICD-10-CM

## 2024-01-27 DIAGNOSIS — E559 Vitamin D deficiency, unspecified: Secondary | ICD-10-CM | POA: Diagnosis not present

## 2024-01-27 DIAGNOSIS — F411 Generalized anxiety disorder: Secondary | ICD-10-CM

## 2024-01-27 DIAGNOSIS — I10 Essential (primary) hypertension: Secondary | ICD-10-CM | POA: Diagnosis not present

## 2024-01-27 DIAGNOSIS — J410 Simple chronic bronchitis: Secondary | ICD-10-CM | POA: Diagnosis not present

## 2024-01-27 DIAGNOSIS — E782 Mixed hyperlipidemia: Secondary | ICD-10-CM

## 2024-01-27 DIAGNOSIS — R5383 Other fatigue: Secondary | ICD-10-CM | POA: Diagnosis not present

## 2024-01-27 DIAGNOSIS — E039 Hypothyroidism, unspecified: Secondary | ICD-10-CM

## 2024-01-27 DIAGNOSIS — K7460 Unspecified cirrhosis of liver: Secondary | ICD-10-CM

## 2024-01-27 DIAGNOSIS — R7303 Prediabetes: Secondary | ICD-10-CM | POA: Diagnosis not present

## 2024-01-27 MED ORDER — ALPRAZOLAM 0.5 MG PO TABS: 0.5000 mg | ORAL_TABLET | Freq: Two times a day (BID) | ORAL | 1 refills | Status: DC | PRN

## 2024-01-27 MED ORDER — XTAMPZA ER 9 MG PO C12A: 9.0000 mg | EXTENDED_RELEASE_CAPSULE | Freq: Two times a day (BID) | ORAL | 0 refills | Status: DC

## 2024-01-27 MED ORDER — NALOXONE HCL 4 MG/0.1ML NA LIQD: NASAL | 0 refills | Status: AC

## 2024-01-27 NOTE — Progress Notes (Signed)
 Established Patient Office Visit  Subjective:  Patient ID: Gabriel Hoffman, male    DOB: 05/30/1956  Age: 68 y.o. MRN: [id number]  Chief Complaint  Patient presents with   Follow-up    3 month follow up    Patient is here today for his 3 months follow up.  He has been feeling fairly well since last appointment.   He does not have additional concerns to discuss today.  Labs are due today. He needs refills.   I have reviewed his active problem list, medication list, allergies, notes from last encounter, lab results for his appointment today.    No other concerns at this time.   Past Medical History:  Diagnosis Date   Anginal pain (HCC)    Anxiety    Aortic atherosclerosis (HCC)    Arthritis    Atrial fibrillation (HCC)    a.) CHA2DS2-VASc Score = 2 (HTN, vascular disease/prior MI).  b.) Rate/rhythm maintained on metoprolol  succinate; chronically anticoagulated with rivaroxaban .   CAD (coronary artery disease)    Carotid artery stenosis    Cirrhosis (HCC) 06/15/2020   Complication of anesthesia    a.) seizure like activity with propofol  during colonoscopy   COPD (chronic obstructive pulmonary disease) (HCC)    Current use of long term anticoagulation    a.) Rivaroxaban    Diastolic dysfunction 02/25/2020   a.) TTE 02/25/2020: normal LV function; EF 55-65%; GLS -15.8%; G1DD.   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 C282Y and S65C were negative.   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   Thrombocytopenia [facility])     Past Surgical History:  Procedure Laterality Date   CARDIAC CATHETERIZATION     CATARACT EXTRACTION Bilateral     COLONOSCOPY     FRACTURE SURGERY Left    pins rods left wrist   HARDWARE REMOVAL Right 04/30/2021   Procedure: HARDWARE REMOVAL, Right tibia IM nail removal;  Surgeon: Molli Angelucci, MD;  Location: [facility] ORS;  Service: Orthopedics;  Laterality: Right;   HERNIA REPAIR Bilateral    LEG SURGERY Right    rod in leg   RIGHT/LEFT HEART CATH AND CORONARY ANGIOGRAPHY Bilateral 10/17/2023   Procedure: RIGHT/LEFT HEART CATH AND CORONARY ANGIOGRAPHY;  Surgeon: Wenona Hamilton, MD;  Location: [facility] INVASIVE CV LAB;  Service: Cardiovascular;  Laterality: Bilateral;   TOTAL KNEE ARTHROPLASTY Right 10/02/2021   Procedure: TOTAL KNEE ARTHROPLASTY;  Surgeon: Molli Angelucci, MD;  Location: [facility] ORS;  Service: Orthopedics;  Laterality: Right;   UPPER GI ENDOSCOPY      Social History   Socioeconomic History   Marital status: Single    Spouse name: Not on file   Number of children: Not on file   Years of education: Not on file   Highest education level: Not on file  Occupational History   Not on file  Tobacco Use   Smoking status: Every Day    Current packs/day: 1.00    Average packs/day: 1 pack/day for 49.4 years (49.4 ttl pk-yrs)    Types: Cigarettes    Start date: 30   Smokeless tobacco: Never  Tobacco comments:    Smokes about 10 cigarettes daily 02/07/2023 sd  Vaping Use   Vaping status: Never Used  Substance and Sexual Activity   Alcohol use: Not Currently    Comment: Quit   Drug use: Never   Sexual activity: Not on file  Other Topics Concern   Not on file  Social History Narrative   Alcohol/beer once/twice a month; 1/2 ppd; used to work in [employer]. In [place]; with sister.    Social Drivers of Corporate investment banker Strain: High Risk (02/03/2024)   Received from [facility]   Overall Financial Resource Strain (CARDIA)    Difficulty of Paying Living Expenses: Hard  Food Insecurity: Food Insecurity Present (02/03/2024)   Received from [facility]   Hunger Vital Sign    Worried About Running Out of Food in the Last Year: Sometimes true    Ran Out of Food in the Last Year: Often true  Transportation Needs: No Transportation Needs (02/03/2024)   Received from [facility] - Transportation    In the past 12 months, has lack of transportation kept you from medical appointments or from getting medications?: No    Lack of Transportation (Non-Medical): No  Physical Activity: Not on file  Stress: Not on file  Social Connections: Not on file  Intimate Partner Violence: Not on file    Family History  Problem Relation Age of Onset   Cancer Mother    COPD Mother    Cancer Sister     Allergies  Allergen Reactions   Codeine Nausea And Vomiting    With fever   Propofol  Other (See Comments)    Seizure like activity during colonoscopy.  Tolerated propofol  infusion with no reaction at all 10/02/21.    Review of Systems  All other systems reviewed and are negative.      Objective:   BP 128/84   Pulse (!) 55   Ht 5\' 5"  (1.651 m)   Wt 173 lb 6.4 oz (78.7 kg)   SpO2 95%   BMI 28.86 kg/m   Vitals:   01/27/24 1041  BP: 128/84  Pulse: (!) 55  Height: 5\' 5"  (1.651 m)  Weight: 173 lb 6.4 oz (78.7 kg)  SpO2: 95%  BMI (Calculated): 28.86    Physical Exam Vitals and nursing note reviewed.  Constitutional:      Appearance: Normal appearance. He is normal weight.  Eyes:     Pupils: Pupils are equal, round, and reactive to light.  Cardiovascular:     Rate and Rhythm: Normal rate and regular rhythm.     Pulses: Normal pulses.     Heart sounds: Normal heart sounds.  Pulmonary:     Effort: Pulmonary effort is normal.     Breath sounds: Normal breath sounds.  Musculoskeletal:        General: Normal range of motion.  Neurological:     General: No focal deficit present.     Mental Status: He is alert and oriented to person, place, and time.  Psychiatric:        Mood and  Affect: Mood normal.        Behavior: Behavior normal.        Thought Content: Thought content normal.        Judgment: Judgment normal.      Results for orders placed or performed in visit on 01/27/24  Lipid panel  Result Value Ref Range  Cholesterol, Total 92 (L) 100 - 199 mg/dL   Triglycerides 69 0 - 149 mg/dL   HDL 30 (L) >16 mg/dL   VLDL Cholesterol Cal 15 5 - 40 mg/dL   LDL Chol Calc (NIH) 47 0 - 99 mg/dL   Chol/HDL Ratio 3.1 0.0 - 5.0 ratio  VITAMIN D  25 Hydroxy (Vit-D Deficiency, Fractures)  Result Value Ref Range   Vit D, 25-Hydroxy 38.2 30.0 - 100.0 ng/mL  CMP14+EGFR  Result Value Ref Range   Glucose 88 70 - 99 mg/dL   BUN 20 8 - 27 mg/dL   Creatinine, Ser 1.09 0.76 - 1.27 mg/dL   eGFR 88 >60 AV/WUJ/8.11   BUN/Creatinine Ratio 21 10 - 24   Sodium 140 134 - 144 mmol/L   Potassium 4.3 3.5 - 5.2 mmol/L   Chloride 105 96 - 106 mmol/L   CO2 22 20 - 29 mmol/L   Calcium  9.3 8.6 - 10.2 mg/dL   Total Protein 6.9 6.0 - 8.5 g/dL   Albumin 4.0 3.9 - 4.9 g/dL   Globulin, Total 2.9 1.5 - 4.5 g/dL   Bilirubin Total 0.5 0.0 - 1.2 mg/dL   Alkaline Phosphatase 78 44 - 121 IU/L   AST 24 0 - 40 IU/L   ALT 30 0 - 44 IU/L  TSH  Result Value Ref Range   TSH 1.410 0.450 - 4.500 uIU/mL  Hemoglobin A1c  Result Value Ref Range   Hgb A1c MFr Bld 5.5 4.8 - 5.6 %   Est. average glucose Bld gHb Est-mCnc 111 mg/dL  Vitamin B12  Result Value Ref Range   Vitamin B-12 656 232 - 1,245 pg/mL    Recent Results (from the past 2160 hours)  Lipid panel     Status: Abnormal   Collection Time: 01/27/24 11:46 AM  Result Value Ref Range   Cholesterol, Total 92 (L) 100 - 199 mg/dL   Triglycerides 69 0 - 149 mg/dL   HDL 30 (L) >91 mg/dL   VLDL Cholesterol Cal 15 5 - 40 mg/dL   LDL Chol Calc (NIH) 47 0 - 99 mg/dL   Chol/HDL Ratio 3.1 0.0 - 5.0 ratio    Comment:                                   T. Chol/HDL Ratio                                             Men  Women                                1/2 Avg.Risk  3.4    3.3                                   Avg.Risk  5.0    4.4                                2X Avg.Risk  9.6    7.1  3X Avg.Risk 23.4   11.0   VITAMIN D  25 Hydroxy (Vit-D Deficiency, Fractures)     Status: None   Collection Time: 01/27/24 11:46 AM  Result Value Ref Range   Vit D, 25-Hydroxy 38.2 30.0 - 100.0 ng/mL    Comment: Vitamin D  deficiency has been defined by the Institute of Medicine and an Endocrine Society practice guideline as a level of serum 25-OH vitamin D  less than 20 ng/mL (1,2). The Endocrine Society went on to further define vitamin D  insufficiency as a level between 21 and 29 ng/mL (2). 1. IOM (Institute of Medicine). 2010. Dietary reference    intakes for calcium  and D. [place]: The    [employer]. 2. Holick MF, Binkley [place], Bischoff-Ferrari HA, et al.    Evaluation, treatment, and prevention of vitamin D     deficiency: an Endocrine Society clinical practice    guideline. JCEM. 2011 Jul; 96(7):1911-30.   CMP14+EGFR     Status: None   Collection Time: 01/27/24 11:46 AM  Result Value Ref Range   Glucose 88 70 - 99 mg/dL   BUN 20 8 - 27 mg/dL   Creatinine, Ser 8.11 0.76 - 1.27 mg/dL   eGFR 88 >91 YN/WGN/5.62   BUN/Creatinine Ratio 21 10 - 24   Sodium 140 134 - 144 mmol/L   Potassium 4.3 3.5 - 5.2 mmol/L   Chloride 105 96 - 106 mmol/L   CO2 22 20 - 29 mmol/L   Calcium  9.3 8.6 - 10.2 mg/dL   Total Protein 6.9 6.0 - 8.5 g/dL   Albumin 4.0 3.9 - 4.9 g/dL   Globulin, Total 2.9 1.5 - 4.5 g/dL   Bilirubin Total 0.5 0.0 - 1.2 mg/dL   Alkaline Phosphatase 78 44 - 121 IU/L   AST 24 0 - 40 IU/L   ALT 30 0 - 44 IU/L  TSH     Status: None   Collection Time: 01/27/24 11:46 AM  Result Value Ref Range   TSH 1.410 0.450 - 4.500 uIU/mL  Hemoglobin A1c     Status: None   Collection Time: 01/27/24 11:46 AM  Result Value Ref Range   Hgb A1c MFr Bld 5.5 4.8 - 5.6 %    Comment:          Prediabetes:  5.7 - 6.4          Diabetes: >6.4          Glycemic control for adults with diabetes: <7.0    Est. average glucose Bld gHb Est-mCnc 111 mg/dL  Vitamin B12     Status: None   Collection Time: 01/27/24 11:46 AM  Result Value Ref Range   Vitamin B-12 656 232 - 1,245 pg/mL  Alpha-1-Antitrypsin Phenotyp     Status: None   Collection Time: 02/21/24  2:08 PM  Result Value Ref Range   A-1 Antitrypsin Pheno MM     Comment: (NOTE) "MM" Phenotype is considered to be "normal", producing normal serum levels of alpha-1-protease inhibitor and not associated with clinical disease. Associated A1A total serum levels in other phenotypes and their incidence in the general population are shown in the table below. Phenotype  Population    % function      A-1-AT Conc.*           Incidence %  compared to MM  (Typical Range)   MM        86.5%           100%         (  96 - 189)   MS         8.0%            86%         (83 - 161)   MZ         3.9%            61%         (60 - 111)   FM         0.4%           100%         (93 - 191)   SZ         0.3%            41%         (42 -  75)   SS         0.1%            64%         (62 - 119)   ZZ         0.05%           19%         (16 -  38)   FS         0.05%           70%         (70 - 128)   FZ        Unknown          46%         (44 -  88)   FF        Unknown                        Unknown *A-1-AT concentration in the homozygous MM phenotype is taken as th e reference normal. Percent deficiency in each phenotype is reported relative to this reference. Ranges used to confirm phenotype. Performed At: [facility] [address] [place], [place] [id number] Pearlean Botts MD [id number]    A-1 Antitrypsin, Ser 158 101 - 187 mg/dL       Assessment & Plan:   Problem List Items Addressed This Visit       Respiratory   Simple chronic bronchitis (HCC)   Patient stable.  Well controlled with current therapy.   Continue current meds.         Relevant Orders   CMP14+EGFR (Completed)   CBC with Differential/Platelet     Digestive   Hepatic cirrhosis, unspecified hepatic cirrhosis type, unspecified whether ascites present (HCC) - Primary   Patient stable.  Well controlled with current therapy.   Continue current meds.        Relevant Orders   CMP14+EGFR (Completed)   CBC with Differential/Platelet     Other   Mixed hyperlipidemia   Checking labs today.  Continue current therapy for lipid control. Will modify as needed based on labwork results.       Relevant Orders   Lipid panel (Completed)   CMP14+EGFR (Completed)   CBC with Differential/Platelet   Generalized anxiety disorder   Patient stable.  Well controlled with current therapy.   Continue current meds.        Other Visit Diagnoses       B12 deficiency due to diet       Checking labs today.  Will continue supplements as needed.   Relevant Orders  CMP14+EGFR (Completed)   Vitamin B12 (Completed)   CBC with Differential/Platelet     Hypothyroidism (acquired)       Checking labs today.  Will continue supplements as needed.   Relevant Orders   CMP14+EGFR (Completed)   CBC with Differential/Platelet     Essential hypertension, benign       Blood pressure well controlled with current medications.  Continue current therapy.  Will reassess at follow up.   Relevant Orders   CMP14+EGFR (Completed)   CBC with Differential/Platelet     Vitamin D  deficiency, unspecified       Checking labs today.  Will continue supplements as needed.   Relevant Orders   VITAMIN D  25 Hydroxy (Vit-D Deficiency, Fractures) (Completed)   CMP14+EGFR (Completed)   CBC with Differential/Platelet     Prediabetes       A1C Continues to be in prediabetic ranges. Will reassess at follow up after next lab check. Patient counseled on dietary choices and verbalized understanding.   Relevant Orders   CMP14+EGFR (Completed)   Hemoglobin A1c (Completed)   CBC with  Differential/Platelet     Other fatigue       Relevant Orders   CMP14+EGFR (Completed)   TSH (Completed)   CBC with Differential/Platelet       Return in about 3 months (around 04/28/2024) for F/U.   Total time spent: 20 minutes  Trenda Frisk, FNP  01/27/2024   This document may have been prepared by [facility] Voice Recognition software and as such may include unintentional dictation errors.

## 2024-01-28 LAB — CMP14+EGFR
ALT: 30 IU/L (ref 0–44)
AST: 24 IU/L (ref 0–40)
Albumin: 4 g/dL (ref 3.9–4.9)
Alkaline Phosphatase: 78 IU/L (ref 44–121)
BUN/Creatinine Ratio: 21 (ref 10–24)
BUN: 20 mg/dL (ref 8–27)
Bilirubin Total: 0.5 mg/dL (ref 0.0–1.2)
CO2: 22 mmol/L (ref 20–29)
Calcium: 9.3 mg/dL (ref 8.6–10.2)
Chloride: 105 mmol/L (ref 96–106)
Creatinine, Ser: 0.96 mg/dL (ref 0.76–1.27)
Globulin, Total: 2.9 g/dL (ref 1.5–4.5)
Glucose: 88 mg/dL (ref 70–99)
Potassium: 4.3 mmol/L (ref 3.5–5.2)
Sodium: 140 mmol/L (ref 134–144)
Total Protein: 6.9 g/dL (ref 6.0–8.5)
eGFR: 88 mL/min/{1.73_m2} (ref >59)

## 2024-01-28 LAB — LIPID PANEL
Chol/HDL Ratio: 3.1 ratio (ref 0.0–5.0)
Cholesterol, Total: 92 mg/dL — ABNORMAL LOW (ref 100–199)
HDL: 30 mg/dL — ABNORMAL LOW (ref >39)
LDL Chol Calc (NIH): 47 mg/dL (ref 0–99)
Triglycerides: 69 mg/dL (ref 0–149)
VLDL Cholesterol Cal: 15 mg/dL (ref 5–40)

## 2024-01-28 LAB — TSH: TSH: 1.41 u[IU]/mL (ref 0.450–4.500)

## 2024-01-28 LAB — HEMOGLOBIN A1C
Est. average glucose Bld gHb Est-mCnc: 111 mg/dL
Hgb A1c MFr Bld: 5.5 % (ref 4.8–5.6)

## 2024-01-28 LAB — VITAMIN D 25 HYDROXY (VIT D DEFICIENCY, FRACTURES): Vit D, 25-Hydroxy: 38.2 ng/mL (ref 30.0–100.0)

## 2024-01-28 LAB — VITAMIN B12: Vitamin B-12: 656 pg/mL (ref 232–1245)

## 2024-02-03 DIAGNOSIS — M79671 Pain in right foot: Secondary | ICD-10-CM | POA: Diagnosis not present

## 2024-02-03 DIAGNOSIS — M722 Plantar fascial fibromatosis: Secondary | ICD-10-CM | POA: Diagnosis not present

## 2024-02-21 ENCOUNTER — Encounter: Payer: Self-pay | Admitting: Pulmonary Disease

## 2024-02-21 ENCOUNTER — Other Ambulatory Visit
Admission: RE | Admit: 2024-02-21 | Discharge: 2024-02-21 | Disposition: A | Discharge status: Home / Self Care | Source: Ambulatory Visit | Attending: Pulmonary Disease | Admitting: Pulmonary Disease

## 2024-02-21 ENCOUNTER — Ambulatory Visit (INDEPENDENT_AMBULATORY_CARE_PROVIDER_SITE_OTHER): Admitting: Pulmonary Disease

## 2024-02-21 VITALS — BP 122/70 | HR 50 | Temp 97.6°F | Ht 65.0 in | Wt 168.6 lb

## 2024-02-21 DIAGNOSIS — I2583 Coronary atherosclerosis due to lipid rich plaque: Secondary | ICD-10-CM

## 2024-02-21 DIAGNOSIS — F17210 Nicotine dependence, cigarettes, uncomplicated: Secondary | ICD-10-CM

## 2024-02-21 DIAGNOSIS — R0602 Shortness of breath: Secondary | ICD-10-CM | POA: Insufficient documentation

## 2024-02-21 DIAGNOSIS — J439 Emphysema, unspecified: Secondary | ICD-10-CM

## 2024-02-21 DIAGNOSIS — J449 Chronic obstructive pulmonary disease, unspecified: Secondary | ICD-10-CM

## 2024-02-21 DIAGNOSIS — I2510 Atherosclerotic heart disease of native coronary artery without angina pectoris: Secondary | ICD-10-CM

## 2024-02-21 MED ORDER — ALBUTEROL SULFATE HFA 108 (90 BASE) MCG/ACT IN AERS: 2.0000 | INHALATION_SPRAY | Freq: Four times a day (QID) | RESPIRATORY_TRACT | 2 refills | Status: DC | PRN

## 2024-02-21 MED ORDER — BREZTRI AEROSPHERE 160-9-4.8 MCG/ACT IN AERO: 2.0000 | INHALATION_SPRAY | Freq: Two times a day (BID) | RESPIRATORY_TRACT | Status: DC

## 2024-02-21 NOTE — Patient Instructions (Addendum)
 VISIT SUMMARY:  During your visit, we discussed your ongoing issues with shortness of breath, particularly during physical activities, and your history of emphysema. We also talked about your smoking habits and the impact they have on your lung health.  YOUR PLAN:  -EMPHYSEMA: Emphysema is a lung condition that causes shortness of breath due to damaged air sacs in the lungs. We will conduct a blood test to rule out inherited causes and perform pulmonary function tests to assess your lung function. You will start using a Breztri  inhaler, two puffs twice a day, to help with your breathing and wheezing. Please remember to rinse your mouth after using the inhaler to prevent any oral side effects. Additionally, an albuterol  inhaler has been prescribed for quick relief. It is important to consider quitting smoking to help manage your condition.  Please let us  know how the Breztri  inhaler is doing for you so we can call it into your pharmacy.  -SHORTNESS OF BREATH: Your shortness of breath, especially during physical activities, is likely related to your emphysema and pollen exposure. The Breztri  inhaler should help improve your symptoms, and the albuterol  inhaler can be used for quick relief when needed.  -TOBACCO USE: Smoking is contributing to your emphysema and shortness of breath. Quitting smoking is crucial for improving your lung health and slowing the progression of emphysema. We strongly encourage you to consider smoking cessation.  INSTRUCTIONS:  Please follow up with the blood test for alpha-1 antitrypsin deficiency and the pulmonary function tests as ordered. Use the Breztri  inhaler as directed, and keep the albuterol  inhaler with you for emergency use. Consider taking steps to quit smoking to improve your overall lung health.

## 2024-02-21 NOTE — Progress Notes (Signed)
 Subjective:    Patient ID: Gabriel Hoffman, male    DOB: 1956-03-08, 68 y.o.   MRN: [id number]  Patient Care Team: Trenda Frisk, FNP as PCP - General (Family Medicine) Constancia Delton, MD as PCP - Cardiology (Cardiology) Ardeen Kohler, MD as PCP - Electrophysiology (Cardiology) Gwyn Leos, MD as Consulting Physician (Oncology)  Chief Complaint  Patient presents with   Consult    Shortness of breath on exertion. Wheezing at night.     BACKGROUND: Patient is a 68 year old current smoker, history of as noted below, who presents for evaluation of shortness of breath on exertion and nocturnal wheezing.  He is referred by Cadence Stana Ear, primary care practitioner is Evander Hills, FNP.   HPI Discussed the use of AI scribe software for clinical note transcription with the patient, who gave verbal consent to proceed.  History of Present Illness   Gabriel Hoffman is a 68 year old male with previously diagnosed emphysema who presents with shortness of breath.  He experiences shortness of breath, particularly during physical activities such as yard work. His symptoms are exacerbated by pollen, which he describes as 'that stuff kills me.'  He has had issues with dyspnea over a long period of time but worse over the last year.  He has also noted nocturnal wheezing particularly when he lies down.  He has a history of smoking approximately eight cigarettes a day, from 3 AM to midnight, and has not attempted to quit smoking. He is part of a lung cancer screening program and had a CT scan in May of the previous year.  He experiences a nighttime cough, which he describes as 'plain,' and does not bring up any sputum with it. No rescue inhaler like albuterol  is used, and he is currently not on any inhalers but is open to trying a maintenance inhaler.   He does not have any military history.Currently not employed and does not endorse significant occupational exposure.    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    Review of Systems A 10 point review of systems was performed and it is as noted above otherwise negative.   Past Medical History:  Diagnosis Date   Anginal pain (HCC)    Anxiety    Aortic atherosclerosis (HCC)    Arthritis    Atrial fibrillation (HCC)    a.) CHA2DS2-VASc Score = 2 (HTN, vascular disease/prior MI).  b.) Rate/rhythm maintained on metoprolol  succinate; chronically anticoagulated with rivaroxaban .   CAD (coronary artery disease)    Carotid artery stenosis    Cirrhosis (HCC) 06/15/2020   Complication of anesthesia    a.) seizure like activity with propofol  during colonoscopy   COPD (chronic obstructive pulmonary disease) (HCC)    Current use of long term anticoagulation    a.) Rivaroxaban    Diastolic dysfunction 02/25/2020   a.) TTE 02/25/2020: normal LV function; EF 55-65%; GLS -15.8%; G1DD.   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 C282Y and S65C were negative.   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   Thrombocytopenia [facility])     Past Surgical History:  Procedure Laterality Date   CARDIAC CATHETERIZATION     CATARACT EXTRACTION Bilateral    COLONOSCOPY     FRACTURE SURGERY Left    pins rods left wrist   HARDWARE REMOVAL Right 04/30/2021   Procedure: HARDWARE REMOVAL, Right tibia IM nail removal;  Surgeon: Molli Angelucci, MD;  Location: [facility] ORS;  Service: Orthopedics;  Laterality: Right;   HERNIA REPAIR Bilateral    LEG SURGERY Right    rod in leg   RIGHT/LEFT HEART CATH AND CORONARY ANGIOGRAPHY Bilateral 10/17/2023    Procedure: RIGHT/LEFT HEART CATH AND CORONARY ANGIOGRAPHY;  Surgeon: Wenona Hamilton, MD;  Location: [facility] INVASIVE CV LAB;  Service: Cardiovascular;  Laterality: Bilateral;   TOTAL KNEE ARTHROPLASTY Right 10/02/2021   Procedure: TOTAL KNEE ARTHROPLASTY;  Surgeon: Molli Angelucci, MD;  Location: [facility] ORS;  Service: Orthopedics;  Laterality: Right;   UPPER GI ENDOSCOPY      Patient Active Problem List   Diagnosis Date Noted   Hepatic cirrhosis, unspecified hepatic cirrhosis type, unspecified whether ascites present (HCC) 01/27/2024   Simple chronic bronchitis (HCC) 01/27/2024   Carotid stenosis 10/19/2022   Hemochromatosis, hereditary (HCC) 07/25/2020   Leg cramps 05/19/2020   Acute pain of right knee 03/21/2020   Chronic active hepatitis (HCC) 02/25/2020   Dysuria 02/25/2020   Abnormal liver function 01/30/2020   History of hepatitis C 01/30/2020   Acquired hypothyroidism 01/30/2020   Essential hypertension 01/04/2020   Coronary artery disease due to lipid rich plaque 01/04/2020   Mixed hyperlipidemia 01/04/2020   Generalized anxiety disorder 01/04/2020    Family History  Problem Relation Age of Onset   Cancer Mother    COPD Mother    Cancer Sister     Social History   Tobacco Use   Smoking status: Every Day    Current packs/day: 1.00    Average packs/day: 1 pack/day for 49.3 years (49.3 ttl pk-yrs)    Types: Cigarettes    Start date: 1976   Smokeless tobacco: Never   Tobacco comments:    Smokes about 10 cigarettes daily 02/07/2023 sd  Substance Use Topics   Alcohol use: Not Currently    Comment: Quit    Allergies  Allergen Reactions   Codeine Nausea And Vomiting    With fever   Propofol  Other (See Comments)    Seizure like activity during colonoscopy.  Tolerated propofol  infusion with no reaction at all 10/02/21.    Current Meds  Medication Sig   albuterol  (VENTOLIN  HFA) 108 (90 Base) MCG/ACT inhaler Inhale 2 puffs into the lungs every 6 (six) hours as needed.    ALPRAZolam  (XANAX ) 0.5 MG tablet Take 1 tablet (0.5 mg total) by mouth 2 (two) times daily as needed for anxiety.   atorvastatin  (LIPITOR) 10 MG tablet Take 1 tablet (10 mg total) by mouth daily.   budeson-glycopyrrolate -formoterol (BREZTRI  AEROSPHERE) 160-9-4.8 MCG/ACT AERO inhaler Inhale 2 puffs into the lungs in the morning and at bedtime.   levothyroxine  (SYNTHROID ) 125 MCG tablet TAKE 1 TABLET DAILY.   lisinopril  (ZESTRIL ) 20 MG tablet Take 1 tablet (20 mg total) by mouth daily.   metoprolol  succinate (TOPROL -XL) 100 MG 24 hr tablet Take 1 tablet (100 mg total) by mouth daily. Take with or immediately following a meal.   naloxone  (NARCAN ) nasal spray 4 mg/0.1 mL Use as needed for respiratory distress due to accidental overdose of medications. One spray into nose.   nitroGLYCERIN  (NITROSTAT )  0.4 MG SL tablet Place 1 tablet (0.4 mg total) under the tongue every 5 (five) minutes x 3 doses as needed for chest pain.   oxyCODONE  ER (XTAMPZA  ER) 9 MG C12A Take 9 mg by mouth every 12 (twelve) hours.   rivaroxaban  (XARELTO ) 20 MG TABS tablet Take 1 tablet (20 mg total) by mouth in the morning.    Immunization History  Administered Date(s) Administered   Fluad Quad(high Dose 65+) 07/20/2023   Influenza Inj Mdck Quad Pf 07/29/2020   Influenza, High Dose Seasonal PF 07/29/2020   Influenza-Unspecified 07/27/2021   Moderna Covid-19 Fall Seasonal Vaccine 85yrs & older July 16, 1956   Moderna Sars-Covid-2 Vaccination 06/28/2020, 07/29/2020   PNEUMOCOCCAL CONJUGATE-20 07/16/2021   Zoster Recombinant(Shingrix ) 06/18/2021, 09/15/2021        Objective:     BP 122/70 (BP Location: Left Arm, Patient Position: Sitting, Cuff Size: Normal)   Pulse (!) 50   Temp 97.6 F (36.4 C) (Temporal)   Ht 5\' 5"  (1.651 m)   Wt 168 lb 9.6 oz (76.5 kg)   SpO2 97%   BMI 28.06 kg/m   SpO2: 97 %  GENERAL: Slightly overweight gentleman, no acute distress.  Fully ambulatory, no conversational dyspnea. HEAD:  Normocephalic, atraumatic.  EYES: Pupils equal, round, reactive to light.  No scleral icterus.  MOUTH: Edentulous, oral mucosa moist.  No thrush. NECK: Supple. No thyromegaly. Trachea midline. No JVD.  No adenopathy. PULMONARY: Good air entry bilaterally.  Coarse, few rhonchi, few end expiratory wheezes noted. CARDIOVASCULAR: S1 and S2.  Bradycardic, regular rhythm, no rubs murmurs gallops heard.  ABDOMEN: Benign. MUSCULOSKELETAL: No joint deformity, no clubbing, no edema.  NEUROLOGIC: No overt focal deficit, no gait disturbance, speech is fluent. SKIN: Intact,warm,dry. PSYCH: Mood and behavior normal.  Patient is enrolled in lung cancer screening gets yearly examinations due for yearly exam in May 2025.  Independent review of prior films show patient has significant emphysema.  Assessment & Plan:     ICD-10-CM   1. COPD suggested by initial evaluation (HCC)  J44.9 Pulmonary function test    2. 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 Shortness of breath  R06.02 Alpha-1 antitrypsin phenotype    Pulmonary function test    4. Hemochromatosis, hereditary (HCC)  E83.110     5. Coronary artery disease due to lipid rich plaque  I25.10    I25.83       Orders Placed This Encounter  Procedures   Alpha-1 antitrypsin phenotype    Standing Status:   Future    Expiration Date:   02/20/2025   Pulmonary function test    Standing Status:   Future    Expected Date:   03/22/2024    Expiration Date:   02/20/2025    Where should this test be performed?:   Outpatient Pulmonary    What type of PFT is being ordered?:   Full PFT    Meds ordered this encounter  Medications   budeson-glycopyrrolate -formoterol (BREZTRI  AEROSPHERE) 160-9-4.8 MCG/ACT AERO inhaler    Sig: Inhale 2 puffs into the lungs in the morning and at bedtime.    Dispense:  2 each    Lot Number?:   [id number]    Expiration Date?:   07/02/2026     Manufacturer?:   [employer] [[id number]]    NDC:   1610-9604-54 [[id number]]    Quantity:   2   albuterol  (VENTOLIN  HFA) 108 (90 Base) MCG/ACT  inhaler    Sig: Inhale 2 puffs into the lungs every 6 (six) hours as needed.    Dispense:  8 g    Refill:  2   Discussion:    Pulmonary emphysema/COPD Emphysema is pronounced, with CT scan showing large bullae and scarring. Likely exacerbated by tobacco use. Differential diagnosis includes inherited emphysema due to alpha 1 antitrypsin. Blood test for alpha-1 antitrypsin deficiency will rule out inherited causes. Pulmonary function tests will assess lung function. Breztri  inhaler trial to address dyspnea and wheezing, with potential benefits for pollen-related symptoms. - Order blood test for alpha-1 antitrypsin deficiency. - Order pulmonary function tests. - Trial Breztri  inhaler, two puffs BID, for dyspnea and wheezing. - Educate on rinsing mouth post-Breztri  to prevent oral side effects. - Prescribe albuterol  inhaler for emergency use. - Encourage smoking cessation.  Shortness of breath Dyspnea primarily during exertion, likely related to emphysema and pollen exposure. Breztri  inhaler trial to assess symptom improvement. Albuterol  inhaler prescribed for acute relief. - Trial Breztri  inhaler for dyspnea improvement. - Prescribe albuterol  inhaler for acute relief.  Tobacco use Continues to smoke approximately eight cigarettes daily, contributing to emphysema and dyspnea. Smoking cessation is crucial for lung health and slowing emphysema progression. - Encourage smoking cessation.     Advised if symptoms do not improve or worsen, to please contact office for sooner follow up or seek emergency care.    I spent 45 minutes of dedicated to the care of this patient on the date of this encounter to include pre-visit review of records, face-to-face time with the patient discussing conditions above, post visit ordering of testing, clinical documentation with the  electronic health record, making appropriate referrals as documented, and communicating necessary findings to members of the patients care team.   C. Chloe Counter, MD Advanced Bronchoscopy PCCM [facility]    *This note was dictated using voice recognition software/Dragon.  Despite best efforts to proofread, errors can occur which can change the meaning. Any transcriptional errors that result from this process are unintentional and may not be fully corrected at the time of dictation.

## 2024-02-24 LAB — ALPHA-1-ANTITRYPSIN PHENOTYP: A-1 Antitrypsin, Ser: 158 mg/dL (ref 101–187)

## 2024-02-25 ENCOUNTER — Other Ambulatory Visit: Payer: Self-pay | Admitting: Family

## 2024-02-25 ENCOUNTER — Other Ambulatory Visit: Payer: Self-pay | Admitting: Medical

## 2024-02-25 DIAGNOSIS — E039 Hypothyroidism, unspecified: Secondary | ICD-10-CM

## 2024-03-05 DIAGNOSIS — M722 Plantar fascial fibromatosis: Secondary | ICD-10-CM | POA: Diagnosis not present

## 2024-03-05 DIAGNOSIS — M216X1 Other acquired deformities of right foot: Secondary | ICD-10-CM | POA: Diagnosis not present

## 2024-03-05 DIAGNOSIS — M79671 Pain in right foot: Secondary | ICD-10-CM | POA: Diagnosis not present

## 2024-03-05 DIAGNOSIS — M7731 Calcaneal spur, right foot: Secondary | ICD-10-CM | POA: Diagnosis not present

## 2024-03-05 DIAGNOSIS — M216X2 Other acquired deformities of left foot: Secondary | ICD-10-CM | POA: Diagnosis not present

## 2024-03-08 ENCOUNTER — Other Ambulatory Visit: Payer: Self-pay | Admitting: Family

## 2024-03-20 ENCOUNTER — Other Ambulatory Visit: Payer: Self-pay

## 2024-03-20 ENCOUNTER — Encounter (INDEPENDENT_AMBULATORY_CARE_PROVIDER_SITE_OTHER): Payer: Self-pay

## 2024-03-20 NOTE — Telephone Encounter (Signed)
 Pt called requesting refill please advise?

## 2024-03-22 MED ORDER — ALPRAZOLAM 0.5 MG PO TABS: 0.5000 mg | ORAL_TABLET | Freq: Two times a day (BID) | ORAL | 1 refills | Status: DC | PRN

## 2024-03-25 NOTE — Assessment & Plan Note (Signed)
 Patient stable.  Well controlled with current therapy.   Continue current meds.

## 2024-03-25 NOTE — Assessment & Plan Note (Signed)
 Checking labs today.  Continue current therapy for lipid control. Will modify as needed based on labwork results.

## 2024-03-26 ENCOUNTER — Other Ambulatory Visit: Payer: Self-pay | Admitting: Family

## 2024-04-09 ENCOUNTER — Other Ambulatory Visit: Payer: Self-pay

## 2024-04-09 MED ORDER — XTAMPZA ER 9 MG PO C12A: 9.0000 mg | EXTENDED_RELEASE_CAPSULE | Freq: Two times a day (BID) | ORAL | 0 refills | Status: DC

## 2024-04-26 ENCOUNTER — Encounter: Payer: Self-pay | Admitting: Pulmonary Disease

## 2024-04-26 ENCOUNTER — Ambulatory Visit: Admitting: Pulmonary Disease

## 2024-04-26 ENCOUNTER — Ambulatory Visit (INDEPENDENT_AMBULATORY_CARE_PROVIDER_SITE_OTHER): Admitting: Pulmonary Disease

## 2024-04-26 VITALS — BP 152/80 | HR 60 | Temp 97.1°F | Ht 65.0 in | Wt 169.2 lb

## 2024-04-26 DIAGNOSIS — F17210 Nicotine dependence, cigarettes, uncomplicated: Secondary | ICD-10-CM | POA: Diagnosis not present

## 2024-04-26 DIAGNOSIS — R0602 Shortness of breath: Secondary | ICD-10-CM

## 2024-04-26 DIAGNOSIS — J449 Chronic obstructive pulmonary disease, unspecified: Secondary | ICD-10-CM

## 2024-04-26 DIAGNOSIS — J439 Emphysema, unspecified: Secondary | ICD-10-CM

## 2024-04-26 LAB — PULMONARY FUNCTION TEST
DL/VA % pred: 82 %
DL/VA: 3.45 ml/min/mmHg/L
DLCO unc % pred: 88 %
DLCO unc: 19.84 ml/min/mmHg
FEF 25-75 Post: 2.18 L/s
FEF 25-75 Pre: 1.43 L/s
FEF2575-%Change-Post: 52 %
FEF2575-%Pred-Post: 98 %
FEF2575-%Pred-Pre: 64 %
FEV1-%Change-Post: 9 %
FEV1-%Pred-Post: 95 %
FEV1-%Pred-Pre: 87 %
FEV1-Post: 2.66 L
FEV1-Pre: 2.44 L
FEV1FVC-%Change-Post: 0 %
FEV1FVC-%Pred-Pre: 93 %
FEV6-%Change-Post: 8 %
FEV6-%Pred-Post: 105 %
FEV6-%Pred-Pre: 97 %
FEV6-Post: 3.74 L
FEV6-Pre: 3.45 L
FEV6FVC-%Change-Post: 0 %
FEV6FVC-%Pred-Post: 104 %
FEV6FVC-%Pred-Pre: 104 %
FVC-%Change-Post: 8 %
FVC-%Pred-Post: 100 %
FVC-%Pred-Pre: 93 %
FVC-Post: 3.79 L
FVC-Pre: 3.5 L
Post FEV1/FVC ratio: 70 %
Post FEV6/FVC ratio: 99 %
Pre FEV1/FVC ratio: 70 %
Pre FEV6/FVC Ratio: 99 %
RV % pred: 109 %
RV: 2.3 L
TLC % pred: 104 %
TLC: 6.25 L

## 2024-04-26 MED ORDER — SPIRIVA RESPIMAT 2.5 MCG/ACT IN AERS: 2.0000 | INHALATION_SPRAY | Freq: Every day | RESPIRATORY_TRACT | 11 refills | Status: DC

## 2024-04-26 NOTE — Progress Notes (Signed)
 Full PFT completed today ? ?

## 2024-04-26 NOTE — Patient Instructions (Signed)
 Full PFT completed today ? ?

## 2024-04-26 NOTE — Progress Notes (Signed)
 Subjective:    Patient ID: Gabriel Hoffman, male    DOB: [birth year], 68 y.o.   MRN: [id number]  Patient Care Team: Orlean Alan HERO, FNP as PCP - General (Family Medicine) Darliss Rogue, MD as PCP - Cardiology (Cardiology) Kennyth Chew, MD as PCP - Electrophysiology (Cardiology) Rennie Cindy SAUNDERS, MD as Consulting Physician (Oncology)  Chief Complaint  Patient presents with   Follow-up    No SOB or cough. Wheezing in the morning.     BACKGROUND/INTERVAL:Patient is a 68 year old current smoker, history of as noted below, who presents for evaluation of shortness of breath on exertion and nocturnal wheezing.  He was initially evaluated here on 21 February 2024.  HPI Discussed the use of AI scribe software for clinical note transcription with the patient, who gave verbal consent to proceed.  History of Present Illness   Gabriel Hoffman is a 68 year old male with COPD who presents with shortness of breath and wheezing.  He experiences shortness of breath and wheezing, with lung function measured at 87% and improving to 95% after using an inhaler. Wheezing and shortness of breath are more pronounced in the mornings, especially after spending time outside, such as when mowing yards.  He has a history of COPD, which is prevalent in his family. He continues to smoke, which exacerbate his symptoms.  He uses Breztri , but not daily, due to it causing heart palpitations. He has a rescue inhaler for as-needed use.     He had pulmonary function testing performed today.  FEV1 is 2.44 L or 87% predicted, FVC is 3.50 L or 93% predicted, FEV1/FVC is 70%.  He has a modest response to bronchodilator.  Lung volumes are normal.  Diffusion capacity low normal.  Consistent with mild obstructive component with some response to bronchodilators.  At his prior visit we had ordered alpha-1 antitrypsin determination, this was normal.  He is phenotype MM.  Review of Systems A 10 point review of  systems was performed and it is as noted above otherwise negative.   Patient Active Problem List   Diagnosis Date Noted   Hepatic cirrhosis, unspecified hepatic cirrhosis type, unspecified whether ascites present (HCC) 01/27/2024   Simple chronic bronchitis (HCC) 01/27/2024   Carotid stenosis 10/19/2022   Hemochromatosis, hereditary (HCC) 07/25/2020   Leg cramps 05/19/2020   Acute pain of right knee 03/21/2020   Chronic active hepatitis (HCC) 02/25/2020   Dysuria 02/25/2020   Abnormal liver function 01/30/2020   History of hepatitis C 01/30/2020   Acquired hypothyroidism 01/30/2020   Essential hypertension 01/04/2020   Coronary artery disease due to lipid rich plaque 01/04/2020   Mixed hyperlipidemia 01/04/2020   Generalized anxiety disorder 01/04/2020    Social History   Tobacco Use   Smoking status: Every Day    Current packs/day: 1.00    Average packs/day: 1 pack/day for 49.5 years (49.5 ttl pk-yrs)    Types: Cigarettes    Start date: 1976   Smokeless tobacco: Never   Tobacco comments:    Smokes about 4-5 cigarettes a day. Khj 04/26/2024  Substance Use Topics   Alcohol use: Not Currently    Comment: Quit    Allergies  Allergen Reactions   Codeine Nausea And Vomiting    With fever   Propofol  Other (See Comments)    Seizure like activity during colonoscopy.  Tolerated propofol  infusion with no reaction at all 10/02/21.    Current Meds  Medication Sig   albuterol  (VENTOLIN  HFA) 108 (90 Base)  MCG/ACT inhaler Inhale 2 puffs into the lungs every 6 (six) hours as needed.   ALPRAZolam  (XANAX ) 0.5 MG tablet Take 1 tablet (0.5 mg total) by mouth 2 (two) times daily as needed for anxiety.   atorvastatin  (LIPITOR) 10 MG tablet Take 1 tablet (10 mg total) by mouth daily.   levothyroxine  (SYNTHROID ) 125 MCG tablet TAKE 1 TABLET DAILY.   lisinopril  (ZESTRIL ) 10 MG tablet Take 10 mg by mouth daily.   metoprolol  succinate (TOPROL -XL) 100 MG 24 hr tablet Take 1 tablet (100 mg  total) by mouth daily. Take with or immediately following a meal.   naloxone  (NARCAN ) nasal spray 4 mg/0.1 mL Use as needed for respiratory distress due to accidental overdose of medications. One spray into nose.   nitroGLYCERIN  (NITROSTAT ) 0.4 MG SL tablet Place 1 tablet (0.4 mg total) under the tongue every 5 (five) minutes x 3 doses as needed for chest pain.   oxyCODONE  ER (XTAMPZA  ER) 9 MG C12A Take 9 mg by mouth every 12 (twelve) hours.   rivaroxaban  (XARELTO ) 20 MG TABS tablet Take 1 tablet (20 mg total) by mouth in the morning.   Tiotropium Bromide Monohydrate  (SPIRIVA  RESPIMAT) 2.5 MCG/ACT AERS Inhale 2 puffs into the lungs daily.   [DISCONTINUED] budeson-glycopyrrolate -formoterol (BREZTRI  AEROSPHERE) 160-9-4.8 MCG/ACT AERO inhaler Inhale 2 puffs into the lungs in the morning and at bedtime.    Immunization History  Administered Date(s) Administered   Fluad Quad(high Dose 65+) 07/20/2023   Influenza Inj Mdck Quad Pf 07/29/2020   Influenza, High Dose Seasonal PF 07/29/2020   Influenza-Unspecified 07/27/2021   Moderna Covid-19 Fall Seasonal Vaccine 61yrs & older 08/14/58   Moderna Sars-Covid-2 Vaccination 06/28/2020, 07/29/2020   PNEUMOCOCCAL CONJUGATE-20 07/16/2021   Zoster Recombinant(Shingrix ) 06/18/2021, 09/15/2021        Objective:     BP (!) 152/80 (BP Location: Right Arm, Cuff Size: Normal)   Pulse 60   Temp (!) 97.1 F (36.2 C)   Ht 5' 5 (1.651 m)   Wt 169 lb 3.2 oz (76.7 kg)   SpO2 94%   BMI 28.16 kg/m   SpO2: 94 % O2 Device: None (Room air)  GENERAL: Slightly overweight gentleman, no acute distress.  Fully ambulatory, no conversational dyspnea. HEAD: Normocephalic, atraumatic.  EYES: Pupils equal, round, reactive to light.  No scleral icterus.  MOUTH: Edentulous, oral mucosa moist.  No thrush. NECK: Supple. No thyromegaly. Trachea midline. No JVD.  No adenopathy. PULMONARY: Good air entry bilaterally.  Coarse,few end expiratory wheezes  noted. CARDIOVASCULAR: S1 and S2.  Bradycardic, regular rhythm, no rubs murmurs gallops heard.  ABDOMEN: Benign. MUSCULOSKELETAL: No joint deformity, no clubbing, no edema.  NEUROLOGIC: No overt focal deficit, no gait disturbance, speech is fluent. SKIN: Intact,warm,dry. PSYCH: Mood and behavior normal.  Recent Results (from the past 2160 hours)  Alpha-1-Antitrypsin Phenotyp     Status: None   Collection Time: 02/21/24  2:08 PM  Result Value Ref Range   A-1 Antitrypsin Pheno MM     Comment: (NOTE) MM Phenotype is considered to be normal, producing normal serum levels of alpha-1-protease inhibitor and not associated with clinical disease. Associated A1A total serum levels in other phenotypes and their incidence in the general population are shown in the table below. Phenotype  Population    % function      A-1-AT Conc.*           Incidence %  compared to MM  (Typical Range)   MM        86.5%  100%         (96 - 189)   MS         8.0%            86%         (83 - 161)   MZ         3.9%            61%         (60 - 111)   FM         0.4%           100%         (93 - 191)   SZ         0.3%            41%         (42 -  75)   SS         0.1%            64%         (62 - 119)   ZZ         0.05%           19%         (16 -  38)   FS         0.05%           70%         (70 - 128)   FZ        Unknown          46%         (44 -  88)   FF        Unknown                        Unknown *A-1-AT concentration in the homozygous MM phenotype is taken as th e reference normal. Percent deficiency in each phenotype is reported relative to this reference. Ranges used to confirm phenotype. Performed At: [facility] [address] [place], [place] [id number] Jennette Shorter MD Ey:[id number]    A-1 Antitrypsin, Ser 158 101 - 187 mg/dL  Pulmonary function test     Status: None (Preliminary result)   Collection Time: 04/26/24  1:40 PM  Result Value Ref Range   FVC-Pre 3.50 L    FVC-%Pred-Pre 93 %   FVC-Post 3.79 L   FVC-%Pred-Post 100 %   FVC-%Change-Post 8 %   FEV1-Pre 2.44 L   FEV1-%Pred-Pre 87 %   FEV1-Post 2.66 L   FEV1-%Pred-Post 95 %   FEV1-%Change-Post 9 %   FEV6-Pre 3.45 L   FEV6-%Pred-Pre 97 %   FEV6-Post 3.74 L   FEV6-%Pred-Post 105 %   FEV6-%Change-Post 8 %   Pre FEV1/FVC ratio 70 %   FEV1FVC-%Pred-Pre 93 %   Post FEV1/FVC ratio 70 %   FEV1FVC-%Change-Post 0 %   Pre FEV6/FVC Ratio 99 %   FEV6FVC-%Pred-Pre 104 %   Post FEV6/FVC ratio 99 %   FEV6FVC-%Pred-Post 104 %   FEV6FVC-%Change-Post 0 %   FEF 25-75 Pre 1.43 L/sec   FEF2575-%Pred-Pre 64 %   FEF 25-75 Post 2.18 L/sec   FEF2575-%Pred-Post 98 %   FEF2575-%Change-Post 52 %   RV 2.30 L   RV % pred 109 %   TLC 6.25 L   TLC % pred 104 %   DLCO unc 19.84 ml/min/mmHg   DLCO unc % pred 88 %  DL/VA 6.54 ml/min/mmHg/L   DL/VA % pred 82 %  *Discussed PFT results and alpha-1 results with the patient.       Assessment & Plan:     ICD-10-CM   1. Stage 1 mild COPD by GOLD classification (HCC)  J44.9     2. Pulmonary emphysema, unspecified emphysema type (HCC)  J43.9     3. Shortness of breath  R06.02     4. Hemochromatosis, hereditary (HCC)  E83.110      Meds ordered this encounter  Medications   Tiotropium Bromide Monohydrate  (SPIRIVA  RESPIMAT) 2.5 MCG/ACT AERS    Sig: Inhale 2 puffs into the lungs daily.    Dispense:  4 g    Refill:  11   Discussion:    Chronic Obstructive Pulmonary Disease (COPD), mild Mild COPD, classified as stage one with lung function at 87% pre-bronchodilator and improved to 95% post-bronchodilator, indicating good response to treatment. Reports intermittent wheezing and dyspnea, particularly in the mornings after outdoor activities. Current use of Breztri  is limited due to side effects of heart palpitations. - Prescribe Spiriva , two puffs once daily, due to expected fewer cardiovascular side effects. - Instruct on the use of Spiriva  inhaler. -  Continue use of rescue inhaler as needed. - Advise taking Claritin at bedtime on days with increased outdoor activity to manage morning congestion. - Schedule follow-up appointment in three months.      Advised if symptoms do not improve or worsen, to please contact office for sooner follow up or seek emergency care.    I spent 30 minutes of dedicated to the care of this patient on the date of this encounter to include pre-visit review of records, face-to-face time with the patient discussing conditions above, post visit ordering of testing, clinical documentation with the electronic health record, making appropriate referrals as documented, and communicating necessary findings to members of the patients care team.     C. Leita Sanders, MD Advanced Bronchoscopy PCCM [facility]    *This note was generated using voice recognition software/Dragon and/or AI transcription program.  Despite best efforts to proofread, errors can occur which can change the meaning. Any transcriptional errors that result from this process are unintentional and may not be fully corrected at the time of dictation.

## 2024-04-26 NOTE — Patient Instructions (Signed)
 VISIT SUMMARY:  Today, we discussed your symptoms of dizziness and wheezing, which are more noticeable in the mornings and after outdoor activities. Your lung function is currently at 87% and improves to 95% after using an inhaler. We also reviewed your current COPD management and made some adjustments to your treatment plan.  YOUR PLAN:  -CHRONIC OBSTRUCTIVE PULMONARY DISEASE (COPD), MILD: COPD is a chronic lung condition that makes it hard to breathe. Your lung function is at 87% before using a bronchodilator and improves to 95% after using it, which is a good response to treatment. Since Breztri  causes heart palpitations for you, we are switching you to Spiriva, which you should take two puffs once daily. This medication is expected to have fewer side effects. Additionally, you should take Claritin at bedtime on days when you have been outdoors to help with morning congestion.  INSTRUCTIONS:  Please schedule a follow-up appointment in three months to review your progress and adjust treatment if necessary.

## 2024-04-27 ENCOUNTER — Encounter: Payer: Self-pay | Admitting: Pulmonary Disease

## 2024-04-27 ENCOUNTER — Other Ambulatory Visit: Payer: Self-pay | Admitting: Acute Care

## 2024-04-27 DIAGNOSIS — Z122 Encounter for screening for malignant neoplasm of respiratory organs: Secondary | ICD-10-CM

## 2024-04-27 DIAGNOSIS — Z87891 Personal history of nicotine dependence: Secondary | ICD-10-CM

## 2024-04-27 DIAGNOSIS — F17210 Nicotine dependence, cigarettes, uncomplicated: Secondary | ICD-10-CM

## 2024-04-30 ENCOUNTER — Encounter: Payer: Self-pay | Admitting: Family

## 2024-04-30 ENCOUNTER — Ambulatory Visit (INDEPENDENT_AMBULATORY_CARE_PROVIDER_SITE_OTHER): Admitting: Family

## 2024-04-30 DIAGNOSIS — E782 Mixed hyperlipidemia: Secondary | ICD-10-CM

## 2024-04-30 DIAGNOSIS — K7460 Unspecified cirrhosis of liver: Secondary | ICD-10-CM | POA: Diagnosis not present

## 2024-04-30 DIAGNOSIS — E559 Vitamin D deficiency, unspecified: Secondary | ICD-10-CM | POA: Diagnosis not present

## 2024-04-30 DIAGNOSIS — E538 Deficiency of other specified B group vitamins: Secondary | ICD-10-CM | POA: Diagnosis not present

## 2024-04-30 DIAGNOSIS — I10 Essential (primary) hypertension: Secondary | ICD-10-CM | POA: Diagnosis not present

## 2024-04-30 DIAGNOSIS — E039 Hypothyroidism, unspecified: Secondary | ICD-10-CM | POA: Diagnosis not present

## 2024-04-30 DIAGNOSIS — J410 Simple chronic bronchitis: Secondary | ICD-10-CM | POA: Diagnosis not present

## 2024-04-30 DIAGNOSIS — R7303 Prediabetes: Secondary | ICD-10-CM

## 2024-04-30 DIAGNOSIS — R5383 Other fatigue: Secondary | ICD-10-CM | POA: Diagnosis not present

## 2024-04-30 NOTE — Assessment & Plan Note (Signed)
 Checking labs today.  Continue current therapy for lipid control. Will modify as needed based on labwork results.   -CMP w/eGFR -Lipid Panel

## 2024-04-30 NOTE — Progress Notes (Signed)
 Established Patient Office Visit  Subjective:  Patient ID: Gabriel Hoffman, male    DOB: 09/04/1956  Age: 68 y.o. MRN: [id number]  Chief Complaint  Patient presents with   Follow-up    3 month follow up    Patient is here today for his 3 months follow up.  He has been feeling well since last appointment.   He does not have additional concerns to discuss today.  Labs are due today. He needs refills.   I have reviewed his active problem list, medication list, allergies, notes from last encounter, lab results for his appointment today.      No other concerns at this time.   Past Medical History:  Diagnosis Date   Anginal pain (HCC)    Anxiety    Aortic atherosclerosis (HCC)    Arthritis    Atrial fibrillation (HCC)    a.) CHA2DS2-VASc Score = 2 (HTN, vascular disease/prior MI).  b.) Rate/rhythm maintained on metoprolol  succinate; chronically anticoagulated with rivaroxaban .   CAD (coronary artery disease)    Carotid artery stenosis    Cirrhosis (HCC) 06/15/2020   Complication of anesthesia    a.) seizure like activity with propofol  during colonoscopy   COPD (chronic obstructive pulmonary disease) (HCC)    Current use of long term anticoagulation    a.) Rivaroxaban    Diastolic dysfunction 02/25/2020   a.) TTE 02/25/2020: normal LV function; EF 55-65%; GLS -15.8%; G1DD.   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 C282Y and S65C were negative.   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   Thrombocytopenia [facility])     Past Surgical History:  Procedure Laterality Date   CARDIAC CATHETERIZATION     CATARACT EXTRACTION Bilateral     COLONOSCOPY     FRACTURE SURGERY Left    pins rods left wrist   HARDWARE REMOVAL Right 04/30/2021   Procedure: HARDWARE REMOVAL, Right tibia IM nail removal;  Surgeon: Kathlynn Sharper, MD;  Location: [facility] ORS;  Service: Orthopedics;  Laterality: Right;   HERNIA REPAIR Bilateral    LEG SURGERY Right    rod in leg   RIGHT/LEFT HEART CATH AND CORONARY ANGIOGRAPHY Bilateral 10/17/2023   Procedure: RIGHT/LEFT HEART CATH AND CORONARY ANGIOGRAPHY;  Surgeon: Darron Deatrice LABOR, MD;  Location: [facility] INVASIVE CV LAB;  Service: Cardiovascular;  Laterality: Bilateral;   TOTAL KNEE ARTHROPLASTY Right 10/02/2021   Procedure: TOTAL KNEE ARTHROPLASTY;  Surgeon: Kathlynn Sharper, MD;  Location: [facility] ORS;  Service: Orthopedics;  Laterality: Right;   UPPER GI ENDOSCOPY      Social History   Socioeconomic History   Marital status: Single    Spouse name: Not on file   Number of children: Not on file   Years of education: Not on file   Highest education level: Not on file  Occupational History   Not on file  Tobacco Use   Smoking status: Every Day    Current packs/day: 1.00    Average packs/day: 1 pack/day for 49.5 years (49.5 ttl pk-yrs)    Types: Cigarettes    Start date: 54   Smokeless tobacco:  Never   Tobacco comments:    Smokes about 4-5 cigarettes a day. Khj 04/26/2024  Vaping Use   Vaping status: Never Used  Substance and Sexual Activity   Alcohol use: Not Currently    Comment: Quit   Drug use: Never   Sexual activity: Not on file  Other Topics Concern   Not on file  Social History Narrative   Alcohol/beer once/twice a month; 1/2 ppd; used to work in [employer]. In [place]; with sister.    Social Drivers of Corporate investment banker Strain: High Risk (02/03/2024)   Received from [facility]   Overall Financial Resource Strain (CARDIA)    Difficulty of Paying Living Expenses: Hard  Food Insecurity: Food Insecurity Present (02/03/2024)   Received from [facility]   Hunger Vital Sign    Within the past 12 months, you worried that your food would run out before you got the money to buy more.: Sometimes true    Within the past 12 months, the food you bought just didn't last and you didn't have money to get more.: Often true  Transportation Needs: No Transportation Needs (02/03/2024)   Received from [facility] - Transportation    In the past 12 months, has lack of transportation kept you from medical appointments or from getting medications?: No    Lack of Transportation (Non-Medical): No  Physical Activity: Not on file  Stress: Not on file  Social Connections: Not on file  Intimate Partner Violence: Not on file    Family History  Problem Relation Age of Onset   Cancer Mother    COPD Mother    Cancer Sister     Allergies  Allergen Reactions   Codeine Nausea And Vomiting    With fever   Propofol  Other (See Comments)    Seizure like activity during colonoscopy.  Tolerated propofol  infusion with no reaction at all 10/02/21.    Review of Systems  All other systems reviewed and are negative.      Objective:   BP 106/60   Pulse (!) 52   Ht 5' 5 (1.651 m)   Wt 167 lb 6.4 oz (75.9 kg)   SpO2 95%   BMI 27.86 kg/m   Vitals:   04/30/24 1053  BP: 106/60  Pulse: (!) 52  Height: 5' 5 (1.651 m)  Weight: 167 lb 6.4 oz (75.9 kg)  SpO2: 95%  BMI (Calculated): 27.86    Physical Exam Vitals and nursing note reviewed.  Constitutional:      Appearance: Normal appearance. He is normal weight.   Eyes:     Pupils: Pupils are equal, round, and reactive to light.    Cardiovascular:     Rate and Rhythm: Normal rate and regular rhythm.     Pulses: Normal pulses.     Heart sounds: Normal heart sounds.  Pulmonary:     Effort: Pulmonary effort is normal.     Breath sounds: Normal breath sounds.   Neurological:     Mental Status: He is alert.   Psychiatric:        Mood and Affect:  Mood normal.        Behavior: Behavior normal.      No results found for any visits on 04/30/24.  Recent Results (from the past 2160 hours)  Alpha-1-Antitrypsin Phenotyp     Status: None   Collection Time: 02/21/24  2:08 PM  Result Value Ref Range  A-1 Antitrypsin Pheno MM     Comment: (NOTE) MM Phenotype is considered to be normal, producing normal serum levels of alpha-1-protease inhibitor and not associated with clinical disease. Associated A1A total serum levels in other phenotypes and their incidence in the general population are shown in the table below. Phenotype  Population    % function      A-1-AT Conc.*           Incidence %  compared to MM  (Typical Range)   MM        86.5%           100%         (96 - 189)   MS         8.0%            86%         (83 - 161)   MZ         3.9%            61%         (60 - 111)   FM         0.4%           100%         (93 - 191)   SZ         0.3%            41%         (42 -  75)   SS         0.1%            64%         (62 - 119)   ZZ         0.05%           19%         (16 -  38)   FS         0.05%           70%         (70 - 128)   FZ        Unknown          46%         (44 -  88)   FF        Unknown                        Unknown *A-1-AT concentration in the homozygous MM phenotype is taken as th e reference normal. Percent deficiency in each phenotype is reported relative to this reference. Ranges used to confirm phenotype. Performed At: [facility] [address] [place], [place] [id number] Jennette Shorter MD Ey:[id number]    A-1 Antitrypsin, Ser 158 101 - 187 mg/dL  Pulmonary function test     Status: None (Preliminary result)   Collection Time: 04/26/24  1:40 PM  Result Value Ref Range   FVC-Pre 3.50 L   FVC-%Pred-Pre 93 %   FVC-Post 3.79 L   FVC-%Pred-Post 100 %   FVC-%Change-Post 8 %   FEV1-Pre 2.44 L   FEV1-%Pred-Pre 87 %   FEV1-Post 2.66 L   FEV1-%Pred-Post 95 %   FEV1-%Change-Post 9 %    FEV6-Pre 3.45 L   FEV6-%Pred-Pre 97 %   FEV6-Post 3.74 L   FEV6-%Pred-Post 105 %   FEV6-%Change-Post 8 %   Pre FEV1/FVC ratio 70 %   FEV1FVC-%Pred-Pre 93 %  Post FEV1/FVC ratio 70 %   FEV1FVC-%Change-Post 0 %   Pre FEV6/FVC Ratio 99 %   FEV6FVC-%Pred-Pre 104 %   Post FEV6/FVC ratio 99 %   FEV6FVC-%Pred-Post 104 %   FEV6FVC-%Change-Post 0 %   FEF 25-75 Pre 1.43 L/sec   FEF2575-%Pred-Pre 64 %   FEF 25-75 Post 2.18 L/sec   FEF2575-%Pred-Post 98 %   FEF2575-%Change-Post 52 %   RV 2.30 L   RV % pred 109 %   TLC 6.25 L   TLC % pred 104 %   DLCO unc 19.84 ml/min/mmHg   DLCO unc % pred 88 %   DL/VA 6.54 ml/min/mmHg/L   DL/VA % pred 82 %       Assessment & Plan Essential hypertension, benign Blood pressure well controlled with current medications.  Continue current therapy.  Will reassess at follow up.   - CBC w/Diff - CMP w/eGFR  Mixed hyperlipidemia Checking labs today.  Continue current therapy for lipid control. Will modify as needed based on labwork results.   -CMP w/eGFR -Lipid Panel  Prediabetes A1C Continues to be in prediabetic ranges.  Will reassess at follow up after next lab check.  Patient counseled on dietary choices and verbalized understanding.   -CBC w/Diff -CMP w/eGFR -Hemoglobin A1C  B12 deficiency due to diet Vitamin D  deficiency, unspecified Other fatigue Hypothyroidism (acquired) Checking labs today.  Will continue supplements as needed.   - Vitamin D  - Vitamin B12 - TSH  Hepatic cirrhosis, unspecified hepatic cirrhosis type, unspecified whether ascites present [facility]) Patient is seen by GI, who manage this condition.  He is well controlled with current therapy.   Will defer to them for further changes to plan of care.     Return in about 3 months (around 07/31/2024).   Total time spent: 20 minutes  ALAN CHRISTELLA ARRANT, FNP  04/30/2024   This document may have been prepared by [facility] Voice Recognition software and as such may  include unintentional dictation errors.

## 2024-04-30 NOTE — Assessment & Plan Note (Signed)
 Patient is seen by GI, who manage this condition.  He is well controlled with current therapy.   Will defer to them for further changes to plan of care.

## 2024-05-01 ENCOUNTER — Ambulatory Visit: Payer: Self-pay | Admitting: Internal Medicine

## 2024-05-01 LAB — CMP14+EGFR
ALT: 28 IU/L (ref 0–44)
AST: 22 IU/L (ref 0–40)
Albumin: 4.2 g/dL (ref 3.9–4.9)
Alkaline Phosphatase: 67 IU/L (ref 44–121)
BUN/Creatinine Ratio: 20 (ref 10–24)
BUN: 20 mg/dL (ref 8–27)
Bilirubin Total: 1.4 mg/dL — ABNORMAL HIGH (ref 0.0–1.2)
CO2: 21 mmol/L (ref 20–29)
Calcium: 9.5 mg/dL (ref 8.6–10.2)
Chloride: 106 mmol/L (ref 96–106)
Creatinine, Ser: 0.98 mg/dL (ref 0.76–1.27)
Globulin, Total: 2.6 g/dL (ref 1.5–4.5)
Glucose: 105 mg/dL — ABNORMAL HIGH (ref 70–99)
Potassium: 4.3 mmol/L (ref 3.5–5.2)
Sodium: 141 mmol/L (ref 134–144)
Total Protein: 6.8 g/dL (ref 6.0–8.5)
eGFR: 85 mL/min/{1.73_m2} (ref >59)

## 2024-05-01 LAB — HEMOGLOBIN A1C
Est. average glucose Bld gHb Est-mCnc: 100 mg/dL
Hgb A1c MFr Bld: 5.1 % (ref 4.8–5.6)

## 2024-05-01 LAB — CBC WITH DIFFERENTIAL/PLATELET
Basophils Absolute: 0.1 10*3/uL (ref 0.0–0.2)
Basos: 1 %
EOS (ABSOLUTE): 0.4 10*3/uL (ref 0.0–0.4)
Eos: 5 %
Hematocrit: 43.9 % (ref 37.5–51.0)
Hemoglobin: 14.9 g/dL (ref 13.0–17.7)
Immature Grans (Abs): 0 10*3/uL (ref 0.0–0.1)
Immature Granulocytes: 0 %
Lymphocytes Absolute: 1.8 10*3/uL (ref 0.7–3.1)
Lymphs: 21 %
MCH: 32.8 pg (ref 26.6–33.0)
MCHC: 33.9 g/dL (ref 31.5–35.7)
MCV: 97 fL (ref 79–97)
Monocytes Absolute: 1.1 10*3/uL — ABNORMAL HIGH (ref 0.1–0.9)
Monocytes: 14 %
Neutrophils Absolute: 4.9 10*3/uL (ref 1.4–7.0)
Neutrophils: 59 %
Platelets: 117 10*3/uL — ABNORMAL LOW (ref 150–450)
RBC: 4.54 x10E6/uL (ref 4.14–5.80)
RDW: 13.5 % (ref 11.6–15.4)
WBC: 8.4 10*3/uL (ref 3.4–10.8)

## 2024-05-01 LAB — LIPID PANEL
Chol/HDL Ratio: 3.1 ratio (ref 0.0–5.0)
Cholesterol, Total: 96 mg/dL — ABNORMAL LOW (ref 100–199)
HDL: 31 mg/dL — ABNORMAL LOW (ref >39)
LDL Chol Calc (NIH): 43 mg/dL (ref 0–99)
Triglycerides: 119 mg/dL (ref 0–149)
VLDL Cholesterol Cal: 22 mg/dL (ref 5–40)

## 2024-05-01 LAB — VITAMIN B12: Vitamin B-12: 495 pg/mL (ref 232–1245)

## 2024-05-01 LAB — VITAMIN D 25 HYDROXY (VIT D DEFICIENCY, FRACTURES): Vit D, 25-Hydroxy: 39.3 ng/mL (ref 30.0–100.0)

## 2024-05-01 LAB — TSH: TSH: 0.599 u[IU]/mL (ref 0.450–4.500)

## 2024-05-07 ENCOUNTER — Other Ambulatory Visit: Payer: Self-pay | Admitting: Family

## 2024-05-21 ENCOUNTER — Other Ambulatory Visit: Payer: Self-pay | Admitting: Pulmonary Disease

## 2024-05-21 MED ORDER — ALBUTEROL SULFATE HFA 108 (90 BASE) MCG/ACT IN AERS: 2.0000 | INHALATION_SPRAY | Freq: Four times a day (QID) | RESPIRATORY_TRACT | 2 refills | Status: AC | PRN

## 2024-05-21 NOTE — Telephone Encounter (Signed)
 Copied from CRM [id number]. Topic: Clinical - Medication Refill >> May 21, 2024  9:45 AM Russell PARAS wrote: Medication: albuterol  (VENTOLIN  HFA) 108 (90 Base) MCG/ACT inhaler  Has the patient contacted their pharmacy? Yes (Agent: If no, request that the patient contact the pharmacy for the refill. If patient does not wish to contact the pharmacy document the reason why and proceed with request.) (Agent: If yes, when and what did the pharmacy advise?)  This is the patient's preferred pharmacy:  [facility] - [place], [place] - [address] [place] [address] Phone: [phone] Fax: [phone]  Is this the correct pharmacy for this prescription? Yes If no, delete pharmacy and type the correct one.   Has the prescription been filled recently? Yes  Is the patient out of the medication? No  Has the patient been seen for an appointment in the last year OR does the patient have an upcoming appointment? Yes  Can we respond through MyChart? Yes  Agent: Please be advised that Rx refills may take up to 3 business days. We ask that you follow-up with your pharmacy.

## 2024-05-24 ENCOUNTER — Other Ambulatory Visit: Payer: Self-pay | Admitting: Family

## 2024-05-24 DIAGNOSIS — E039 Hypothyroidism, unspecified: Secondary | ICD-10-CM

## 2024-06-04 ENCOUNTER — Other Ambulatory Visit: Payer: Self-pay | Admitting: Family

## 2024-06-07 ENCOUNTER — Other Ambulatory Visit: Payer: Self-pay | Admitting: Family

## 2024-06-22 ENCOUNTER — Ambulatory Visit
Admission: RE | Admit: 2024-06-22 | Discharge: 2024-06-22 | Disposition: A | Discharge status: Home / Self Care | Payer: 59 | Source: Ambulatory Visit | Attending: Internal Medicine | Admitting: Internal Medicine

## 2024-06-22 DIAGNOSIS — K7460 Unspecified cirrhosis of liver: Secondary | ICD-10-CM | POA: Diagnosis not present

## 2024-06-22 DIAGNOSIS — N281 Cyst of kidney, acquired: Secondary | ICD-10-CM | POA: Diagnosis not present

## 2024-06-22 DIAGNOSIS — K769 Liver disease, unspecified: Secondary | ICD-10-CM | POA: Insufficient documentation

## 2024-06-22 DIAGNOSIS — Z9189 Other specified personal risk factors, not elsewhere classified: Secondary | ICD-10-CM | POA: Insufficient documentation

## 2024-06-22 DIAGNOSIS — K7689 Other specified diseases of liver: Secondary | ICD-10-CM | POA: Diagnosis not present

## 2024-06-22 MED ORDER — GADOBUTROL 1 MMOL/ML IV SOLN
7.5000 mL | Freq: Once | INTRAVENOUS | Status: AC | PRN
  Administered 2024-06-22: 7.5 mL via INTRAVENOUS

## 2024-07-04 ENCOUNTER — Other Ambulatory Visit: Payer: Self-pay | Admitting: Family

## 2024-07-09 ENCOUNTER — Inpatient Hospital Stay (HOSPITAL_BASED_OUTPATIENT_CLINIC_OR_DEPARTMENT_OTHER): Payer: 59 | Admitting: Internal Medicine

## 2024-07-09 ENCOUNTER — Encounter: Payer: Self-pay | Admitting: Internal Medicine

## 2024-07-09 ENCOUNTER — Inpatient Hospital Stay: Payer: 59 | Attending: Internal Medicine

## 2024-07-09 DIAGNOSIS — Z7901 Long term (current) use of anticoagulants: Secondary | ICD-10-CM | POA: Diagnosis not present

## 2024-07-09 DIAGNOSIS — K769 Liver disease, unspecified: Secondary | ICD-10-CM

## 2024-07-09 DIAGNOSIS — Z79899 Other long term (current) drug therapy: Secondary | ICD-10-CM | POA: Insufficient documentation

## 2024-07-09 DIAGNOSIS — D696 Thrombocytopenia, unspecified: Secondary | ICD-10-CM | POA: Diagnosis not present

## 2024-07-09 DIAGNOSIS — F17210 Nicotine dependence, cigarettes, uncomplicated: Secondary | ICD-10-CM | POA: Diagnosis not present

## 2024-07-09 DIAGNOSIS — G8929 Other chronic pain: Secondary | ICD-10-CM | POA: Insufficient documentation

## 2024-07-09 DIAGNOSIS — I480 Paroxysmal atrial fibrillation: Secondary | ICD-10-CM | POA: Insufficient documentation

## 2024-07-09 DIAGNOSIS — D731 Hypersplenism: Secondary | ICD-10-CM | POA: Diagnosis not present

## 2024-07-09 LAB — CMP (CANCER CENTER ONLY)
ALT: 28 U/L (ref 0–44)
AST: 27 U/L (ref 15–41)
Albumin: 3.8 g/dL (ref 3.5–5.0)
Alkaline Phosphatase: 56 U/L (ref 38–126)
Anion gap: 8 (ref 5–15)
BUN: 14 mg/dL (ref 8–23)
CO2: 22 mmol/L (ref 22–32)
Calcium: 9 mg/dL (ref 8.9–10.3)
Chloride: 106 mmol/L (ref 98–111)
Creatinine: 1.21 mg/dL (ref 0.61–1.24)
GFR, Estimated: >60 mL/min (ref >60)
Glucose, Bld: 118 mg/dL — ABNORMAL HIGH (ref 70–99)
Potassium: 4.4 mmol/L (ref 3.5–5.1)
Sodium: 136 mmol/L (ref 135–145)
Total Bilirubin: 2 mg/dL — ABNORMAL HIGH (ref 0.0–1.2)
Total Protein: 7.4 g/dL (ref 6.5–8.1)

## 2024-07-09 LAB — CBC WITH DIFFERENTIAL (CANCER CENTER ONLY)
Abs Immature Granulocytes: 0.04 K/uL (ref 0.00–0.07)
Basophils Absolute: 0.1 K/uL (ref 0.0–0.1)
Basophils Relative: 1 %
Eosinophils Absolute: 0.5 K/uL (ref 0.0–0.5)
Eosinophils Relative: 6 %
HCT: 44.6 % (ref 39.0–52.0)
Hemoglobin: 15.5 g/dL (ref 13.0–17.0)
Immature Granulocytes: 1 %
Lymphocytes Relative: 21 %
Lymphs Abs: 1.9 K/uL (ref 0.7–4.0)
MCH: 33.7 pg (ref 26.0–34.0)
MCHC: 34.8 g/dL (ref 30.0–36.0)
MCV: 97 fL (ref 80.0–100.0)
Monocytes Absolute: 1 K/uL (ref 0.1–1.0)
Monocytes Relative: 11 %
Neutro Abs: 5.4 K/uL (ref 1.7–7.7)
Neutrophils Relative %: 60 %
Platelet Count: 112 K/uL — ABNORMAL LOW (ref 150–400)
RBC: 4.6 MIL/uL (ref 4.22–5.81)
RDW: 13.1 % (ref 11.5–15.5)
WBC Count: 8.8 K/uL (ref 4.0–10.5)
nRBC: 0 % (ref 0.0–0.2)

## 2024-07-09 LAB — FERRITIN: Ferritin: 66 ng/mL (ref 24–336)

## 2024-07-09 LAB — IRON AND TIBC
Iron: 179 ug/dL (ref 45–182)
Saturation Ratios: 45 % — ABNORMAL HIGH (ref 17.9–39.5)
TIBC: 399 ug/dL (ref 250–450)
UIBC: 220 ug/dL

## 2024-07-09 NOTE — Assessment & Plan Note (Addendum)
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  NOV 2024- ferritin-LOW.  HOLD off phlebotomy at this time.   #Thrombocytopenia-platelets 114  [baseline 100] -secondary to hypersplenism cirrhosis-monitor closely as especially patient on Xarelto .  # HCC surveillance: FEB 17th, 2025- Coarsened, nodular hepatic echotexture, suggestive of cirrhotic changes. Hypoechoic lesion measuring 1.0 cm within the left lobe. However- MRI liver-AUG 2025- 1. There is an approximately 7 x 10 mm arterial hyperenhancing target like lesion in the right hepatic lobe, segment 7, which is incompletely characterized on the current exam.   LI-RADS 3 lesion-recommend further imaging again in 4 months.  There is no lesion in the left hepatic lobe, corresponding to the hypoechoic area seen on the prior ultrasound  # Hepatitis C/cirrhosis-child Pugh-A- s/p hep C therapy. Defer to Gi [Dr.Wohl]. stable  # Active smoker- trying to quit/ Recommend quitting smoking/LCSP [MAY 2024]-  stable  # Paroxysmal atrial fibrillation [Dr.Etang] s/p ablation September 2020 at [facility]. on BB , Xarelto -discussed watching out for bleeding while on Xarelto . stable  ? Phlebotomy  # DISPOSITION: # follow up  in 4  months- MD: cbc/cmp; AFP- iron studies; ferritin- MRI liver-Dr.B  # I reviewed the blood work- with the patient in detail; also reviewed the imaging independently [as summarized above]; and with the patient in detail.

## 2024-07-09 NOTE — Progress Notes (Signed)
 [facility] CONSULT NOTE  Patient Care Team: Orlean Alan HERO, FNP as PCP - General (Family Medicine) Darliss Rogue, MD as PCP - Cardiology (Cardiology) Kennyth Chew, MD as PCP - Electrophysiology (Cardiology) Rennie Cindy SAUNDERS, MD as Consulting Physician (Oncology)  CHIEF COMPLAINTS/PURPOSE OF CONSULTATION: Hemochromatosis.  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  Two copies of H63D were identified.  C282Y and S65C were negative;Elevated Ferritin/I sat-88%;  SEP 2021- s/p Phlebotomy x3  # Elevated AFP- 73 [SEP 2021]; NOV 2021- MRI- liver NEG;  # Cirrhosis/ HEP C [started end of sep,2021 x 12 weeks; Dr.Wohl]  # CAD- on xarelto .   HISTORY OF PRESENTING ILLNESS: Ambulating independently. Alone.   Gabriel Hoffman 68 y.o.  male history of hepatitis C/cirrhosis splenomegaly and history of hereditary hemochromatosis/elevated iron saturation/ferritin is here for follow-up/review results of the liver MRI.  MRI liver 06/22/24. C/o chest pain yesterday, lasted 5 minutes. No pain today   Patient states that his energy is low. Appetite is good. Patient has occasional tenderness in his liver right abdomen. Denies any  denies any worsening vomiting diarrhea.  Denies any leg swelling.  Chronic lower extremity cramps.  Not any worse.  Patient continues to have chronic back pain joint pains.  Joint pains back pain not any worse.   Unfortunately continues to smoke.  Review of Systems  Constitutional:  Positive for malaise/fatigue. Negative for chills, diaphoresis, fever and weight loss.  HENT:  Negative for nosebleeds and sore throat.   Eyes:  Negative for double vision.  Respiratory:  Negative for cough, hemoptysis, sputum production, shortness of breath and wheezing.   Cardiovascular:  Negative for chest pain, palpitations, orthopnea and leg  swelling.  Gastrointestinal:  Negative for abdominal pain, blood in stool, constipation, diarrhea, heartburn, melena, nausea and vomiting.  Genitourinary:  Negative for dysuria, frequency and urgency.  Musculoskeletal:  Positive for back pain, joint pain and myalgias.  Skin: Negative.  Negative for itching and rash.  Neurological:  Negative for dizziness, tingling, focal weakness, weakness and headaches.  Endo/Heme/Allergies:  Does not bruise/bleed easily.  Psychiatric/Behavioral:  Negative for depression. The patient is not nervous/anxious and does not have insomnia.     MEDICAL HISTORY:  Past Medical History:  Diagnosis Date   Anginal pain (HCC)    Anxiety    Aortic atherosclerosis (HCC)    Arthritis    Atrial fibrillation (HCC)    a.) CHA2DS2-VASc Score = 2 (HTN, vascular disease/prior MI).  b.) Rate/rhythm maintained on metoprolol  succinate; chronically anticoagulated with rivaroxaban .   CAD (coronary artery disease)    Carotid artery stenosis    Cirrhosis (HCC) 06/15/2020   Complication of anesthesia    a.) seizure like activity with propofol  during colonoscopy   COPD (chronic obstructive pulmonary disease) (HCC)    Current use of long term anticoagulation    a.) Rivaroxaban    Diastolic dysfunction 02/25/2020   a.) TTE 02/25/2020: normal LV function; EF 55-65%; GLS -15.8%; G1DD.   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 C282Y and S65C were negative.   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   Thrombocytopenia (HCC)     SURGICAL HISTORY: Past Surgical History:  Procedure Laterality Date   CARDIAC CATHETERIZATION     CATARACT  EXTRACTION Bilateral    COLONOSCOPY     FRACTURE SURGERY Left    pins rods left wrist   HARDWARE REMOVAL Right 04/30/2021   Procedure: HARDWARE REMOVAL, Right tibia IM nail removal;  Surgeon: Kathlynn Sharper, MD;  Location: [facility] ORS;  Service: Orthopedics;  Laterality: Right;   HERNIA REPAIR Bilateral    LEG SURGERY Right    rod in leg   RIGHT/LEFT HEART CATH AND CORONARY ANGIOGRAPHY Bilateral 10/17/2023   Procedure: RIGHT/LEFT HEART CATH AND CORONARY ANGIOGRAPHY;  Surgeon: Darron Deatrice LABOR, MD;  Location: [facility] INVASIVE CV LAB;  Service: Cardiovascular;  Laterality: Bilateral;   TOTAL KNEE ARTHROPLASTY Right 10/02/2021   Procedure: TOTAL KNEE ARTHROPLASTY;  Surgeon: Kathlynn Sharper, MD;  Location: [facility] ORS;  Service: Orthopedics;  Laterality: Right;   UPPER GI ENDOSCOPY      SOCIAL HISTORY: Social History   Socioeconomic History   Marital status: Single    Spouse name: Not on file   Number of children: Not on file   Years of education: Not on file   Highest education level: Not on file  Occupational History   Not on file  Tobacco Use   Smoking status: Every Day    Current packs/day: 1.00    Average packs/day: 1 pack/day for 49.7 years (49.7 ttl pk-yrs)    Types: Cigarettes    Start date: 1976   Smokeless tobacco: Never   Tobacco comments:    Smokes about 4-5 cigarettes a day. Khj 04/26/2024  Vaping Use   Vaping status: Never Used  Substance and Sexual Activity   Alcohol use: Not Currently    Comment: Quit   Drug use: Never   Sexual activity: Not on file  Other Topics Concern   Not on file  Social History Narrative   Alcohol/beer once/twice a month; 1/2 ppd; used to work in [employer]. In [place]; with sister.    Social Drivers of Corporate investment banker Strain: High Risk (02/03/2024)   Received from [facility]   Overall Financial Resource Strain (CARDIA)    Difficulty of Paying Living Expenses: Hard  Food Insecurity: Food Insecurity Present  (02/03/2024)   Received from [facility]   Hunger Vital Sign    Within the past 12 months, you worried that your food would run out before you got the money to buy more.: Sometimes true    Within the past 12 months, the food you bought just didn't last and you didn't have money to get more.: Often true  Transportation Needs: No Transportation Needs (02/03/2024)   Received from [facility] - Transportation    In the past 12 months, has lack of transportation kept you from medical appointments or from getting medications?: No    Lack of Transportation (Non-Medical): No  Physical Activity: Not on file  Stress: Not on file  Social Connections: Not on file  Intimate Partner Violence: Not on file    FAMILY HISTORY: Family History  Problem Relation Age of Onset   Cancer Mother    COPD Mother    Cancer Sister     ALLERGIES:  is allergic to codeine and propofol .  MEDICATIONS:  Current Outpatient Medications  Medication Sig Dispense Refill  albuterol  (VENTOLIN  HFA) 108 (90 Base) MCG/ACT inhaler Inhale 2 puffs into the lungs every 6 (six) hours as needed. 8 g 2   ALPRAZolam  (XANAX ) 0.5 MG tablet Take 1 tablet (0.5 mg total) by mouth 2 (two) times daily as needed for anxiety. 60 tablet 1   atorvastatin  (LIPITOR) 10 MG tablet Take 1 tablet (10 mg total) by mouth daily. 90 tablet 3   levothyroxine  (SYNTHROID ) 125 MCG tablet TAKE 1 TABLET DAILY. 90 tablet 0   lisinopril  (ZESTRIL ) 10 MG tablet Take 10 mg by mouth daily.     metoprolol  succinate (TOPROL -XL) 100 MG 24 hr tablet Take 1 tablet (100 mg total) by mouth daily. Take with or immediately following a meal. 90 tablet 3   naloxone  (NARCAN ) nasal spray 4 mg/0.1 mL Use as needed for respiratory distress due to accidental overdose of medications. One spray into nose. 2 each 0   nitroGLYCERIN  (NITROSTAT ) 0.4 MG SL tablet Place 1 tablet (0.4 mg total) under the tongue every 5 (five) minutes x 3 doses as  needed for chest pain. 25 tablet 2   rivaroxaban  (XARELTO ) 20 MG TABS tablet Take 1 tablet (20 mg total) by mouth in the morning. 90 tablet 3   XTAMPZA  ER 9 MG C12A Take 9 mg by mouth every 12 (twelve) hours. 60 capsule 0   No current facility-administered medications for this visit.      PHYSICAL EXAMINATION:   Vitals:   07/09/24 1329  BP: 131/68  Pulse: (!) 58  Resp: 16  Temp: 98.1 F (36.7 C)  SpO2: 96%    Filed Weights   07/09/24 1329  Weight: 166 lb 3.2 oz (75.4 kg)     Physical Exam HENT:     Head: Normocephalic and atraumatic.     Mouth/Throat:     Pharynx: No oropharyngeal exudate.  Eyes:     Pupils: Pupils are equal, round, and reactive to light.  Cardiovascular:     Rate and Rhythm: Normal rate and regular rhythm.  Pulmonary:     Effort: No respiratory distress.     Breath sounds: No wheezing.     Comments: Decreased air entry bilaterally.  Abdominal:     General: Bowel sounds are normal. There is no distension.     Palpations: Abdomen is soft. There is no mass.     Tenderness: There is no abdominal tenderness. There is no guarding or rebound.  Musculoskeletal:        General: No tenderness. Normal range of motion.     Cervical back: Normal range of motion and neck supple.  Skin:    General: Skin is warm.  Neurological:     Mental Status: He is alert and oriented to person, place, and time.  Psychiatric:        Mood and Affect: Affect normal.     LABORATORY DATA:  I have reviewed the data as listed Lab Results  Component Value Date   WBC 8.8 07/09/2024   HGB 15.5 07/09/2024   HCT 44.6 07/09/2024   MCV 97.0 07/09/2024   PLT 112 (L) 07/09/2024   Recent Labs    12/26/23 1258 01/27/24 1146 04/30/24 1154 07/09/24 1308  NA 137 140 141 136  K 4.5 4.3 4.3 4.4  CL 104 105 106 106  CO2 26 22 21 22   GLUCOSE 121* 88 105* 118*  BUN 19 20 20 14   CREATININE 1.07 0.96 0.98 1.21  CALCIUM  9.1 9.3 9.5 9.0  GFRNONAA >60  --   --  >  60  PROT 7.4  6.9 6.8 7.4  ALBUMIN 3.6 4.0 4.2 3.8  AST 23 24 22 27   ALT 29 30 28 28   ALKPHOS 62 78 67 56  BILITOT 1.1 0.5 1.4* 2.0*     MR LIVER W WO CONTRAST Result Date: 06/22/2024 CLINICAL DATA:  Liver Lesion; Hx of cirrhosis. EXAM: MRI ABDOMEN WITHOUT AND WITH CONTRAST TECHNIQUE: Multiplanar multisequence MR imaging of the abdomen was performed both before and after the administration of intravenous contrast. CONTRAST:  7.5mL GADAVIST  GADOBUTROL  1 MMOL/ML IV SOLN COMPARISON:  Ultrasound abdomen from 12/19/2023 and MRI abdomen from 03/03/2023. FINDINGS: Lower chest: Unremarkable MR appearance to the lung bases. No pleural effusion. No pericardial effusion. Normal heart size. Hepatobiliary: There is diffuse liver surface nodularity, compatible with cirrhosis. There is an approximately 7 x 10 mm arterial hyperenhancing target like lesion in the right hepatic lobe, segment 7 (series 14, image 24). The lesion is not well seen on any other sequences. The lesion is isointense on the portal venous phase and delayed/equilibrium phase images. This is incompletely characterized on the current exam. This can be characterized as LI-RADS 3 lesion. There is no lesion in the left hepatic lobe, corresponding to the hypoechoic area seen on the prior ultrasound. No intrahepatic or extrahepatic bile duct dilatation. No choledocholithiasis. Unremarkable gallbladder. Pancreas: No mass, inflammatory changes or other parenchymal abnormality identified. No main pancreatic duct dilation. Spleen: Top-normal spleen measuring up to 7.2 x 12.6 cm orthogonally on coronal plane. No focal mass. Adrenals/Urinary Tract: Unremarkable adrenal glands. No hydroureteronephrosis. No suspicious renal mass. Scattered sub 5 mm simple cysts noted in bilateral kidneys. Stomach/Bowel: Visualized portions within the abdomen are unremarkable. No disproportionate dilation of bowel loops. Vascular/Lymphatic: No pathologically enlarged lymph nodes identified. No  abdominal aortic aneurysm demonstrated. No ascites. Other:  None. Musculoskeletal: No suspicious bone lesions identified. IMPRESSION: 1. There is an approximately 7 x 10 mm arterial hyperenhancing target like lesion in the right hepatic lobe, segment 7, which is incompletely characterized on the current exam. This can be characterized as LI-RADS 3 lesion. Follow-up examination in 3-6 months is recommended. 2. There is no lesion in the left hepatic lobe, corresponding to the hypoechoic area seen on the prior ultrasound. 3. Multiple other nonacute observations, as described above. Electronically Signed   By: Ree Molt M.D.   On: 06/22/2024 12:16    Hemochromatosis, hereditary (HCC) # Hereditary hemochromatosis-[Elevated iron saturation-iron studies; unclear if patient has organ involvement-given the hepatitis C/cirrhosis]- s/p phelbtomy x3.  NOV 2024- ferritin-LOW.  HOLD off phlebotomy at this time.   #Thrombocytopenia-platelets 114  [baseline 100] -secondary to hypersplenism cirrhosis-monitor closely as especially patient on Xarelto .  # HCC surveillance: FEB 17th, 2025- Coarsened, nodular hepatic echotexture, suggestive of cirrhotic changes. Hypoechoic lesion measuring 1.0 cm within the left lobe. However- MRI liver-AUG 2025- 1. There is an approximately 7 x 10 mm arterial hyperenhancing target like lesion in the right hepatic lobe, segment 7, which is incompletely characterized on the current exam.   LI-RADS 3 lesion-recommend further imaging again in 4 months.  There is no lesion in the left hepatic lobe, corresponding to the hypoechoic area seen on the prior ultrasound  # Hepatitis C/cirrhosis-child Pugh-A- s/p hep C therapy. Defer to Gi [Dr.Wohl]. stable  # Active smoker- trying to quit/ Recommend quitting smoking/LCSP [MAY 2024]-  stable  # Paroxysmal atrial fibrillation [Dr.Etang] s/p ablation September 2020 at [facility]. on BB , Xarelto -discussed watching out for bleeding while on  Xarelto . stable  ?  Phlebotomy  # DISPOSITION: # follow up  in 4  months- MD: cbc/cmp; AFP- iron studies; ferritin- MRI liver-Dr.B  # I reviewed the blood work- with the patient in detail; also reviewed the imaging independently [as summarized above]; and with the patient in detail.    All questions were answered. The patient knows to call the clinic with any problems, questions or concerns.   Cindy JONELLE Joe, MD 07/09/2024 2:12 PM

## 2024-07-09 NOTE — Progress Notes (Signed)
 MRI liver 06/22/24. C/o chest pain yesterday, lasted 5 minutes. No pain today.

## 2024-07-10 LAB — AFP TUMOR MARKER: AFP, Serum, Tumor Marker: 2.1 ng/mL (ref 0.0–8.4)

## 2024-07-13 ENCOUNTER — Telehealth (HOSPITAL_BASED_OUTPATIENT_CLINIC_OR_DEPARTMENT_OTHER): Payer: Self-pay

## 2024-07-13 NOTE — Telephone Encounter (Signed)
 Copied from CRM [id number]. Topic: General - Call Back - No Documentation >> Jul 13, 2024  9:48 AM Lavanda D wrote: Reason for CRM: Patient said he missed call from the clinic, unable to find any documentation on call/any reason for call. Please call patient back if needed.

## 2024-07-13 NOTE — Telephone Encounter (Signed)
 I do not see where we have reached out to the patient.  Nothing further needed.

## 2024-07-16 ENCOUNTER — Other Ambulatory Visit: Payer: Self-pay | Admitting: Family

## 2024-07-16 ENCOUNTER — Other Ambulatory Visit: Payer: Self-pay

## 2024-07-18 ENCOUNTER — Other Ambulatory Visit: Payer: Self-pay | Admitting: Family

## 2024-07-19 ENCOUNTER — Other Ambulatory Visit: Payer: Self-pay | Admitting: Family

## 2024-07-19 MED ORDER — XTAMPZA ER 9 MG PO C12A: 9.0000 mg | EXTENDED_RELEASE_CAPSULE | Freq: Two times a day (BID) | ORAL | 0 refills | Status: DC

## 2024-07-31 ENCOUNTER — Encounter: Payer: Self-pay | Admitting: Pulmonary Disease

## 2024-07-31 ENCOUNTER — Ambulatory Visit: Admitting: Pulmonary Disease

## 2024-07-31 VITALS — BP 140/86 | HR 70 | Temp 98.0°F | Ht 65.0 in | Wt 164.8 lb

## 2024-07-31 DIAGNOSIS — R0602 Shortness of breath: Secondary | ICD-10-CM

## 2024-07-31 DIAGNOSIS — J439 Emphysema, unspecified: Secondary | ICD-10-CM

## 2024-07-31 DIAGNOSIS — J449 Chronic obstructive pulmonary disease, unspecified: Secondary | ICD-10-CM

## 2024-07-31 DIAGNOSIS — F17210 Nicotine dependence, cigarettes, uncomplicated: Secondary | ICD-10-CM | POA: Diagnosis not present

## 2024-07-31 NOTE — Progress Notes (Signed)
 Subjective:    Patient ID: Gabriel Hoffman, male    DOB: 1956-06-13, 68 y.o.   MRN: [id number]  Patient Care Team: Orlean Alan HERO, FNP as PCP - General (Family Medicine) Darliss Rogue, MD as PCP - Cardiology (Cardiology) Kennyth Chew, MD as PCP - Electrophysiology (Cardiology) Rennie Cindy SAUNDERS, MD as Consulting Physician (Oncology)  Chief Complaint  Patient presents with   COPD    BACKGROUND/INTERVAL: Patient is a 68 year old current smoker, history of as noted below, who presents for evaluation of shortness of breath on exertion and nocturnal wheezing.  He was last seen here on 26 April 2024.   HPI Discussed the use of AI scribe software for clinical note transcription with the patient, who gave verbal consent to proceed.  History of Present Illness   He is a 68 year old male with COPD who presents for follow-up.  No exacerbations since his prior visit.  He has not needed to use his rescue inhaler recently. He continues to smoke approximately five cigarettes per day.  We discussed importance of quitting smoking.  He is currently enrolled in a lung cancer screening program. He has not yet received his flu shot this season, as he typically gets it at [employer].  No recent cough, sputum production, or increased shortness of breath.   He is compliant with Spiriva  Respimat.  He uses albuterol  rarely.     DATA 02/21/2024 alpha-1 antitrypsin: Phenotype MM, level 158 mg/dL (normal). 04/26/2024 PFTs: FEV1 is 2.44 L or 87% predicted, FVC is 3.50 L or 93% predicted, FEV1/FVC is 70%. He has a modest response to bronchodilator. Lung volumes are normal. Diffusion capacity low normal. Consistent with mild obstructive component with some response to bronchodilators.   Review of Systems A 10 point review of systems was performed and it is as noted above otherwise negative.   Patient Active Problem List   Diagnosis Date Noted   Hepatic cirrhosis, unspecified hepatic  cirrhosis type, unspecified whether ascites present (HCC) 01/27/2024   Simple chronic bronchitis (HCC) 01/27/2024   Carotid stenosis 10/19/2022   Hemochromatosis, hereditary 07/25/2020   Leg cramps 05/19/2020   Acute pain of right knee 03/21/2020   Chronic active hepatitis (HCC) 02/25/2020   Dysuria 02/25/2020   Abnormal liver function 01/30/2020   History of hepatitis C 01/30/2020   Acquired hypothyroidism 01/30/2020   Essential hypertension 01/04/2020   Coronary artery disease due to lipid rich plaque 01/04/2020   Mixed hyperlipidemia 01/04/2020   Generalized anxiety disorder 01/04/2020    Social History   Tobacco Use   Smoking status: Every Day    Current packs/day: 0.50    Average packs/day: 1 pack/day for 49.7 years (49.6 ttl pk-yrs)    Types: Cigarettes    Start date: 1976   Smokeless tobacco: Never   Tobacco comments:    Smokes about 4-5 cigarettes a day. sd  Substance Use Topics   Alcohol use: Not Currently    Comment: Quit    Allergies  Allergen Reactions   Codeine Nausea And Vomiting    With fever   Propofol  Other (See Comments)    Seizure like activity during colonoscopy.  Tolerated propofol  infusion with no reaction at all 10/02/21.    Current Meds  Medication Sig   albuterol  (VENTOLIN  HFA) 108 (90 Base) MCG/ACT inhaler Inhale 2 puffs into the lungs every 6 (six) hours as needed.   ALPRAZolam  (XANAX ) 0.5 MG tablet Take 1 tablet (0.5 mg total) by mouth 2 (two) times daily as  needed for anxiety.   atorvastatin  (LIPITOR) 10 MG tablet Take 1 tablet (10 mg total) by mouth daily.   levothyroxine  (SYNTHROID ) 125 MCG tablet TAKE 1 TABLET DAILY.   lisinopril  (ZESTRIL ) 10 MG tablet Take 10 mg by mouth daily.   metoprolol  succinate (TOPROL -XL) 100 MG 24 hr tablet Take 1 tablet (100 mg total) by mouth daily. Take with or immediately following a meal.   naloxone  (NARCAN ) nasal spray 4 mg/0.1 mL Use as needed for respiratory distress due to accidental overdose of  medications. One spray into nose.   nitroGLYCERIN  (NITROSTAT ) 0.4 MG SL tablet Place 1 tablet (0.4 mg total) under the tongue every 5 (five) minutes x 3 doses as needed for chest pain.   oxyCODONE  ER (XTAMPZA  ER) 9 MG C12A Take 9 mg by mouth every 12 (twelve) hours.   rivaroxaban  (XARELTO ) 20 MG TABS tablet Take 1 tablet (20 mg total) by mouth in the morning.   SPIRIVA  RESPIMAT 2.5 MCG/ACT AERS SMARTSIG:2 Puff(s) Via Inhaler Daily    Immunization History  Administered Date(s) Administered   Fluad Quad(high Dose 65+) 07/20/2023   INFLUENZA, HIGH DOSE SEASONAL PF 07/29/2020   Influenza Inj Mdck Quad Pf 07/29/2020   Influenza-Unspecified 07/27/2021   Moderna Covid-19 Fall Seasonal Vaccine 81yrs & older Nov 12, 1955   Moderna Sars-Covid-2 Vaccination 06/28/2020, 07/29/2020   PNEUMOCOCCAL CONJUGATE-20 07/16/2021   Zoster Recombinant(Shingrix ) 06/18/2021, 09/15/2021        Objective:     BP (!) 140/86   Pulse 70   Temp 98 F (36.7 C) (Temporal)   Ht 5' 5 (1.651 m)   Wt 164 lb 12.8 oz (74.8 kg)   SpO2 95%   BMI 27.42 kg/m   SpO2: 95 %  GENERAL: Slightly overweight gentleman, no acute distress.  Fully ambulatory, no conversational dyspnea. HEAD: Normocephalic, atraumatic.  EYES: Pupils equal, round, reactive to light.  No scleral icterus.  MOUTH: Edentulous, oral mucosa moist.  No thrush. NECK: Supple. No thyromegaly. Trachea midline. No JVD.  No adenopathy. PULMONARY: Good air entry bilaterally.  Coarse, few rhonchi, few end expiratory wheezes noted. CARDIOVASCULAR: S1 and S2.  Regular rate, regular rhythm, no rubs murmurs gallops heard.  ABDOMEN: Benign. MUSCULOSKELETAL: No joint deformity, no clubbing, no edema.  NEUROLOGIC: No overt focal deficit, no gait disturbance, speech is fluent. SKIN: Intact,warm,dry. PSYCH: Mood and behavior normal.        Assessment & Plan:     ICD-10-CM   1. Stage 1 mild COPD by GOLD classification (HCC)  J44.9     2. Pulmonary  emphysema, unspecified emphysema type  J43.9     3. Shortness of breath  R06.02     4. Hemochromatosis, hereditary  E83.110      Discussion:    Chronic obstructive pulmonary disease (COPD) COPD is well-managed with no recent exacerbations and no use of rescue inhaler. Continues effective use of maintenance inhaler. - Continue current inhaler regimen. - Schedule follow-up in six months unless symptoms worsen.  Tobacco use Currently smoking approximately five cigarettes per day. - Continue enrollment in lung cancer screening program. - Counseled with regards to discontinuation of smoking.  Preventive care Influenza vaccination not yet received this season. - Offer influenza vaccination today, patient declined.    Advised if symptoms do not improve or worsen, to please contact office for sooner follow up or seek emergency care.    I spent 30 minutes of dedicated to the care of this patient on the date of this encounter to include pre-visit review  of records, face-to-face time with the patient discussing conditions above, post visit ordering of testing, clinical documentation with the electronic health record, making appropriate referrals as documented, and communicating necessary findings to members of the patients care team.     C. Leita Sanders, MD Advanced Bronchoscopy PCCM [facility]    *This note was generated using voice recognition software/Dragon and/or AI transcription program.  Despite best efforts to proofread, errors can occur which can change the meaning. Any transcriptional errors that result from this process are unintentional and may not be fully corrected at the time of dictation.

## 2024-07-31 NOTE — Patient Instructions (Addendum)
 VISIT SUMMARY:  You are a 68 year old male with COPD who came in for a follow-up visit. You have not needed to use your rescue inhaler recently and continue to smoke about five cigarettes per day. You are enrolled in a lung cancer screening program and have not yet received your flu shot this season. You reported no recent cough, sputum production, or increased shortness of breath.  YOUR PLAN:  -CHRONIC OBSTRUCTIVE PULMONARY DISEASE (COPD): COPD is a chronic lung condition that makes it hard to breathe. Your COPD is well-managed with no recent flare-ups, and you have not needed to use your rescue inhaler. Please continue using your current maintenance inhaler as prescribed (Spiriva  2 puffs once a day). We will schedule a follow-up appointment in six months unless your symptoms worsen.  -TOBACCO USE: You are currently smoking about five cigarettes per day. It is important to continue your enrollment in the lung cancer screening program to monitor your lung health.  It is very important that you continue to cut back on smoking and quit altogether.  -PREVENTIVE CARE: You have not yet received your flu shot this season. We recommend getting your flu vaccination as soon as possible to protect yourself from the flu.  INSTRUCTIONS:  Please schedule a follow-up appointment in six months for your COPD management unless your symptoms worsen. Additionally, make sure to get your flu shot as soon as possible.

## 2024-08-01 ENCOUNTER — Ambulatory Visit: Admitting: Family

## 2024-08-01 NOTE — Progress Notes (Unsigned)
 Cardiology Office Note   Date:  08/02/2024  ID:  Thai, Burgueno 1956-10-14, MRN [id number] PCP: Orlean Alan HERO, FNP  [facility] Providers Cardiologist:  Redell Cave, MD Electrophysiologist:  Fonda Kitty, MD   History of Present Illness COREY LASKI is a 68 y.o. male with a hx of  hypertension, hyperlipidemia, atrial fibrillation status post ablation in September 2020, ? MI in February 2020, current smoker x 50 years who presents for 58-month follow-up.   Patient reported having heart attack in February 2020 while he was incarcerated.  He was taken to [facility] in [place] .  He said he was here from week, no procedures were performed and he was placed on medications.  In September he reported being taken to [facility] where he was diagnosed with atrial fibrillation and started on Xarelto .Echocardiogram in April 2021 showed normal LVSF, EF 55 to 60%, impaired relaxation, normal LA size.  Cardiac monitor November 2022 showed paroxysmal A-fib with percent burden.  Chest CT lungs cancer screening in 2023 showed three-vessel CAD. Myoview  Lexiscan  in 07/2022 for chest pain  was low risk with no evidence of ischemia, LAD and RCA calcifications noted, LVEF 69%. Cardiac CTA in 09/2023 showed coronary calcium  score of 715, 80th percentile for age and sex matched, moderate proximal LAD stenosis of 50 to 69%, mild RCA stenosis of 25%, minimal left circumflex stenosis less than 25%.  It was sent for FFR testing which showed significant stenosis in the ostial first diagonal D1 with FFR 0.79.  Proximal to mid LAD FFR 0.81.  Recommended cardiac cath.LHC showed nonobstructive CAD and Toprol  was increased.    Patient saw EP on November 08, 2023 for PACs.  Prior heart monitor for palpitations showed normal sinus rhythm, 2% burden of A-fib/flutter, PAC burden of 8.6%.  Beta-blocker was continued.  The patient was last seen 01/23/24 and was stable from a  cardiac perspective.   Today, the patient is overall doing well. He had chest pain on Monday when he was mowing the lawn. He stopped and pain went away. Pain lasted 2-3 minutes. Before this, he was not having frequent chest pain. No associated symptoms. He tool a NTG and this improved the pain. No lower leg edema. He is still smoking 5 cigarettes daily.  Patient had labs last month that were stable.   Studies Reviewed  Echo 11/2023  1. Left ventricular ejection fraction, by estimation, is 55 to 60%. The  left ventricle has normal function. The left ventricle has no regional  wall motion abnormalities. Left ventricular diastolic parameters were  normal. The average left ventricular  global longitudinal strain is -19.0 %.   2. Right ventricular systolic function is normal. The right ventricular  size is normal. Tricuspid regurgitation signal is inadequate for assessing  PA pressure. The estimated right ventricular systolic pressure is 24.4  mmHg.   3. Left atrial size was mild to moderately dilated.   4. The mitral valve is normal in structure. Mild to moderate mitral valve  regurgitation. No evidence of mitral stenosis.   5. The aortic valve is tricuspid. Aortic valve regurgitation is not  visualized. No aortic stenosis is present.   6. The inferior vena cava is normal in size with greater than 50%  respiratory variability, suggesting right atrial pressure of 3 mmHg.    Heart monitor 10/2023 Patch Wear Time:  8 days and 14 hours (2024-12-02T21:15:00-0500 to 2024-12-11T11:25:27-0500)   Patient had a min HR of 42 bpm,  max HR of 182 bpm, and avg HR of 63 bpm. Predominant underlying rhythm was Sinus Rhythm. 2 Ventricular Tachycardia runs occurred, the run with the fastest interval lasting 6 beats with a max rate of 154 bpm, the longest  lasting 6 beats with an avg rate of 135 bpm. 54 Supraventricular Tachycardia runs occurred, the run with the fastest interval lasting 4 beats with a max rate  of 182 bpm, the longest lasting 28.3 secs with an avg rate of 129 bpm. Atrial  Fibrillation/Flutter occurred (2% burden), ranging from 53-129 bpm (avg of 95 bpm), the longest lasting 3 hours 23 mins with an avg rate of 96 bpm. Atrial Flutter may be possible Atrial Tachycardia with variable block. Isolated SVEs were frequent (8.6%,  [id number]), SVE Couplets were occasional (1.8%, 7021), and SVE Triplets were rare (<1.0%, 1060). Isolated VEs were occasional (1.1%, 8711), VE Couplets were rare (<1.0%, 58), and VE Triplets were rare (<1.0%, 5). Ventricular Bigeminy and Trigeminy were  present.   Conclusion Average heart rate 63, minimal heart rate 42, max heart rate 182. Atrial fibrillation/atrial flutter noted, 2% burden. Frequent PACs 8.6% burden. Occasional PVCs 1.1% burden. Nonsustained VT lasting 6 beats. Paroxysmal nonsustained SVT noted.  R/L heart cath 10/2023   1st Diag lesion is 50% stenosed.   Prox LAD lesion is 30% stenosed.   The left ventricular systolic function is normal.   LV end diastolic pressure is mildly elevated.   The left ventricular ejection fraction is 50-55% by visual estimate.   1.  Mild to moderate nonobstructive one-vessel coronary artery disease involving the proximal LAD/diagonal bifurcation. 2.  Low normal LV systolic function. 3.  Mildly to moderately elevated left ventricular end-diastolic pressure at 20 mmHg likely due to diastolic dysfunction.   Recommendations: Recommend aggressive medical therapy for nonobstructive coronary artery disease. Proceed with an echocardiogram to evaluate diastolic function.   EKG Interpretation Date/Time:  Thursday August 02 2024 10:35:37 EDT Ventricular Rate:  57 PR Interval:  170 QRS Duration:  74 QT Interval:  396 QTC Calculation: 385 R Axis:   24  Text Interpretation: Sinus bradycardia When compared with ECG of 01-Nov-2023 15:56, No significant change was found Confirmed by Franchester, Bates Collington ([id number]) on 08/02/2024  11:03:48 AM    Cardiac CTA 10/2023 IMPRESSION: 1. Coronary calcium  score of 715. This was 88th percentile for age and sex matched control.   2. Normal coronary origin with right dominance.   3. Moderate proximal LAD stenosis (50-69%).   4. Mild RCA stenosis (25%).  Minimal LCx stenosis (<25%).   5. CAD-RADS 3. Moderate stenosis. Consider symptom-guided anti-ischemic pharmacotherapy as well as risk factor modification per guideline directed care.   6. Additional analysis with CT FFR will be submitted and reported separately. 1. Left Main:  No significant stenosis.   2. LAD: significant stenosis in ostial first diagonal, FFRct 0.79. Prox-mid LAD FFRct 0.81. 3. LCX: No significant stenosis.  FFRct 0.89 4. RCA: No significant stenosis.  FFRct 0.94   IMPRESSION: 1. CT FFR analysis showed significant stenosis in the ostial first diagonal (D1), FFRct 0.79.   2. Prox-mid LAD FFRct 0.81.   3.  Recommend cardiac catheterization.       Physical Exam VS:  BP 136/78 (BP Location: Left Arm, Patient Position: Sitting, Cuff Size: Normal)   Pulse (!) 57   Wt 164 lb (74.4 kg)   SpO2 96%   BMI 27.29 kg/m        Wt Readings from Last 3 Encounters:  08/02/24 164  lb (74.4 kg)  07/31/24 164 lb 12.8 oz (74.8 kg)  07/09/24 166 lb 3.2 oz (75.4 kg)    GEN: Well nourished, well developed in no acute distress NECK: No JVD; No carotid bruits CARDIAC: bradycardia, RR, no murmurs, rubs, gallops RESPIRATORY:  Clear to auscultation without rales, wheezing or rhonchi  ABDOMEN: Soft, non-tender, non-distended EXTREMITIES:  No edema; No deformity   ASSESSMENT AND PLAN  Nonobstructive CAD The patient had 1 episode of chest pain on Monday while mowing the lawn.  Chest pain resolved with rest and sublingual nitroglycerin .  Prior to this he was not having any anginal symptoms.  Right and left heart cath in December 2024 showed mild to moderate nonobstructive one-vessel CAD involving the proximal  LAD/diagonal bifurcation, low normal LVSF.  We discussed antianginals versus stress test.  Patient would not like to pursue any further testing or treatment at this time.  Will continue Lipitor 10 mg daily, Toprol  100 mg daily.  No aspirin  given Xarelto .  Paroxysmal Afib s/p ablation 07/2019 EKG shows sinus bradycardia.  Continue Toprol  100 mg daily for rate control.  Continue Xarelto  for stroke prophylaxis.  No bleeding issues reported.  Tobacco use Patient reports smoking 5 cigarettes a day.  Cessation recommended.  HTN Blood pressure is good today.  Continue lisinopril  10 mg daily and Toprol  100 mg daily.  Hyperlipidemia LDL 43.  Continue Lipitor 10 mg daily.       Dispo: Follow-up in 6 months  Signed, Giavanni Odonovan VEAR Fishman, PA-C

## 2024-08-02 ENCOUNTER — Ambulatory Visit: Attending: Medical | Admitting: Medical

## 2024-08-02 ENCOUNTER — Encounter: Payer: Self-pay | Admitting: Medical

## 2024-08-02 VITALS — BP 136/78 | HR 57 | Wt 164.0 lb

## 2024-08-02 DIAGNOSIS — E782 Mixed hyperlipidemia: Secondary | ICD-10-CM

## 2024-08-02 DIAGNOSIS — I10 Essential (primary) hypertension: Secondary | ICD-10-CM

## 2024-08-02 DIAGNOSIS — Z720 Tobacco use: Secondary | ICD-10-CM

## 2024-08-02 DIAGNOSIS — I480 Paroxysmal atrial fibrillation: Secondary | ICD-10-CM

## 2024-08-02 DIAGNOSIS — I25118 Atherosclerotic heart disease of native coronary artery with other forms of angina pectoris: Secondary | ICD-10-CM

## 2024-08-02 MED ORDER — LISINOPRIL 10 MG PO TABS: 10.0000 mg | ORAL_TABLET | Freq: Every day | ORAL | 3 refills | Status: DC

## 2024-08-02 MED ORDER — ATORVASTATIN CALCIUM 10 MG PO TABS
10.0000 mg | ORAL_TABLET | Freq: Every day | ORAL | 3 refills | Status: AC
Start: 2024-08-02 — End: ?

## 2024-08-02 MED ORDER — METOPROLOL SUCCINATE ER 100 MG PO TB24: 100.0000 mg | ORAL_TABLET | Freq: Every day | ORAL | 3 refills | Status: AC

## 2024-08-02 NOTE — Patient Instructions (Signed)
 Medication Instructions:  Your physician recommends that you continue on your current medications as directed. Please refer to the Current Medication list given to you today.  *If you need a refill on your cardiac medications before your next appointment, please call your pharmacy*  Lab Work: No labs ordered today  If you have labs (blood work) drawn today and your tests are completely normal, you will receive your results only by: MyChart Message (if you have MyChart) OR A paper copy in the mail If you have any lab test that is abnormal or we need to change your treatment, we will call you to review the results.  Testing/Procedures: No test ordered today   Follow-Up: At [facility], you and your health needs are our priority.  As part of our continuing mission to provide you with exceptional heart care, our providers are all part of one team.  This team includes your primary Cardiologist (physician) and Advanced Practice Providers or APPs (Physician Assistants and Nurse Practitioners) who all work together to provide you with the care you need, when you need it.  Your next appointment:   6 month(s)  Provider:   You may see Redell Cave, MD or one of the following Advanced Practice Providers on your designated Care Team:    Cadence Franchester, [place]   We recommend signing up for the patient portal called MyChart.  Sign up information is provided on this After Visit Summary.  MyChart is used to connect with patients for Virtual Visits (Telemedicine).  Patients are able to view lab/test results, encounter notes, upcoming appointments, etc.  Non-urgent messages can be sent to your provider as well.   To learn more about what you can do with MyChart, go to [email or web address].

## 2024-08-05 ENCOUNTER — Other Ambulatory Visit: Payer: Self-pay | Admitting: Family

## 2024-08-06 ENCOUNTER — Other Ambulatory Visit: Payer: Self-pay | Admitting: Family

## 2024-08-07 NOTE — Telephone Encounter (Signed)
 Pt LM at 2:06p asking for this to be called into [facility]

## 2024-08-09 ENCOUNTER — Other Ambulatory Visit: Payer: Self-pay | Admitting: Family

## 2024-08-14 ENCOUNTER — Ambulatory Visit (INDEPENDENT_AMBULATORY_CARE_PROVIDER_SITE_OTHER): Admitting: Family

## 2024-08-14 ENCOUNTER — Encounter: Payer: Self-pay | Admitting: Family

## 2024-08-14 VITALS — BP 118/76 | HR 73 | Ht 65.0 in | Wt 159.6 lb

## 2024-08-14 DIAGNOSIS — E538 Deficiency of other specified B group vitamins: Secondary | ICD-10-CM | POA: Diagnosis not present

## 2024-08-14 DIAGNOSIS — Z23 Encounter for immunization: Secondary | ICD-10-CM

## 2024-08-14 DIAGNOSIS — Z79899 Other long term (current) drug therapy: Secondary | ICD-10-CM | POA: Diagnosis not present

## 2024-08-14 DIAGNOSIS — I10 Essential (primary) hypertension: Secondary | ICD-10-CM

## 2024-08-14 DIAGNOSIS — E559 Vitamin D deficiency, unspecified: Secondary | ICD-10-CM | POA: Diagnosis not present

## 2024-08-14 DIAGNOSIS — R7303 Prediabetes: Secondary | ICD-10-CM

## 2024-08-14 DIAGNOSIS — E782 Mixed hyperlipidemia: Secondary | ICD-10-CM | POA: Diagnosis not present

## 2024-08-14 DIAGNOSIS — F419 Anxiety disorder, unspecified: Secondary | ICD-10-CM

## 2024-08-14 DIAGNOSIS — E039 Hypothyroidism, unspecified: Secondary | ICD-10-CM | POA: Diagnosis not present

## 2024-08-14 DIAGNOSIS — G894 Chronic pain syndrome: Secondary | ICD-10-CM | POA: Diagnosis not present

## 2024-08-14 DIAGNOSIS — R5383 Other fatigue: Secondary | ICD-10-CM

## 2024-08-14 NOTE — Progress Notes (Unsigned)
 Established Patient Office Visit  Subjective:  Patient ID: Gabriel Hoffman, male    DOB: 07/17/1956  Age: 68 y.o. MRN: [id number]  Chief Complaint  Patient presents with  . Follow-up    3 month follow up    Patient is here today for his 3 months follow up.  He has been feeling fairly well since last appointment.   He does have additional concerns to discuss today.  Pt reports experiencing muscle cramps nightly affecting his leg, foot, and back. Describes cramps as severe. Not on feet frequently during day. Denies alcohol consumption. Has been consuming multiple bananas daily and taking over-the-counter cramp pills from [employer] with variable effectiveness. Recent fall into ditch approximately three days ago while doing yard work, resulting in leg giving out. Had previous surgery on affected leg. Currently experiencing ongoing pain from fall. Labs are due today.  He needs refills.   I have reviewed his active problem list, medication list, allergies, notes from last encounter, lab results for his appointment today.      No other concerns at this time.   Past Medical History:  Diagnosis Date  . Anginal pain   . Anxiety   . Aortic atherosclerosis   . Arthritis   . Atrial fibrillation (HCC)    a.) CHA2DS2-VASc Score = 2 (HTN, vascular disease/prior MI).  b.) Rate/rhythm maintained on metoprolol  succinate; chronically anticoagulated with rivaroxaban .  SABRA CAD (coronary artery disease)   . Carotid artery stenosis   . Cirrhosis (HCC) 06/15/2020  . Complication of anesthesia    a.) seizure like activity with propofol  during colonoscopy  . COPD (chronic obstructive pulmonary disease) (HCC)   . Current use of long term anticoagulation    a.) Rivaroxaban   . Diastolic dysfunction 02/25/2020   a.) TTE 02/25/2020: normal LV function; EF 55-65%; GLS -15.8%; G1DD.  SABRA Dyspnea   . GERD (gastroesophageal reflux disease)   . 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 C282Y and S65C were negative.  . Hepatitis C 06/15/2020   a.) Tx'd with Harvoni course  . HLD (hyperlipidemia)   . Hypertension   . Hypothyroidism   . Lung nodule   . Myocardial infarction (HCC) 2019   x2 MI's no stents  . 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 Thrombocytopenia     Past Surgical History:  Procedure Laterality Date  . CARDIAC CATHETERIZATION    . CATARACT EXTRACTION Bilateral   . COLONOSCOPY    . FRACTURE SURGERY Left    pins rods left wrist  . HARDWARE REMOVAL Right 04/30/2021   Procedure: HARDWARE REMOVAL, Right tibia IM nail removal;  Surgeon: Kathlynn Sharper, MD;  Location: [facility] ORS;  Service: Orthopedics;  Laterality: Right;  . HERNIA REPAIR Bilateral   . LEG SURGERY Right    rod in leg  . RIGHT/LEFT HEART CATH AND CORONARY ANGIOGRAPHY Bilateral 10/17/2023   Procedure: RIGHT/LEFT HEART CATH AND CORONARY ANGIOGRAPHY;  Surgeon: Darron Deatrice LABOR, MD;  Location: [facility] INVASIVE CV LAB;  Service: Cardiovascular;  Laterality: Bilateral;  . TOTAL KNEE ARTHROPLASTY Right 10/02/2021   Procedure: TOTAL KNEE ARTHROPLASTY;  Surgeon: Kathlynn Sharper, MD;  Location: [facility] ORS;  Service: Orthopedics;  Laterality: Right;  . UPPER GI ENDOSCOPY      Social History   Socioeconomic History  . Marital status: Single    Spouse name: Not on file  . Number of children: Not on  file  . Years of education: Not on file  . Highest education level: Not on file  Occupational History  . Not on file  Tobacco Use  . Smoking status: Every Day    Current packs/day: 0.50    Average packs/day: 1 pack/day for 49.8 years (49.6 ttl pk-yrs)    Types: Cigarettes    Start date: 87  . Smokeless tobacco: Never  . Tobacco comments:    Smokes about 4-5 cigarettes a day. sd  Vaping Use  . Vaping status: Never Used  Substance and Sexual Activity  . Alcohol use: Not Currently    Comment: Quit  . Drug use: Never   . Sexual activity: Not on file  Other Topics Concern  . Not on file  Social History Narrative   Alcohol/beer once/twice a month; 1/2 ppd; used to work in [employer]. In [place]; with sister.    Social Drivers of Health   Financial Resource Strain: High Risk (02/03/2024)   Received from [facility]   Overall Financial Resource Strain (CARDIA)   . Difficulty of Paying Living Expenses: Hard  Food Insecurity: Food Insecurity Present (02/03/2024)   Received from [facility]   Hunger Vital Sign   . Within the past 12 months, you worried that your food would run out before you got the money to buy more.: Sometimes true   . Within the past 12 months, the food you bought just didn't last and you didn't have money to get more.: Often true  Transportation Needs: No Transportation Needs (02/03/2024)   Received from [facility]   [facility] - Transportation   . In the past 12 months, has lack of transportation kept you from medical appointments or from getting medications?: No   . Lack of Transportation (Non-Medical): No  Physical Activity: Not on file  Stress: Not on file  Social Connections: Not on file  Intimate Partner Violence: Not on file    Family History  Problem Relation Age of Onset  . Cancer Mother   . COPD Mother   . Cancer Sister     Allergies  Allergen Reactions  . Codeine Nausea And Vomiting    With fever  . Propofol  Other (See Comments)    Seizure like activity during colonoscopy.  Tolerated propofol  infusion with no reaction at all 10/02/21.    Review of Systems  Musculoskeletal:  Positive for back pain and neck pain.  Psychiatric/Behavioral:  The patient is nervous/anxious.   All other systems reviewed and are negative.      Objective:   BP 118/76   Pulse 73   Ht 5' 5 (1.651 m)   Wt 159 lb 9.6 oz (72.4 kg)   SpO2 94%   BMI 26.56 kg/m   Vitals:   08/14/24 1110  BP: 118/76  Pulse: 73  Height: 5'  5 (1.651 m)  Weight: 159 lb 9.6 oz (72.4 kg)  SpO2: 94%  BMI (Calculated): 26.56    Physical Exam Vitals and nursing note reviewed.  Constitutional:      Appearance: Normal appearance. He is normal weight.  Eyes:     Pupils: Pupils are equal, round, and reactive to light.  Cardiovascular:     Rate and Rhythm: Normal rate and regular rhythm.     Pulses: Normal pulses.     Heart sounds: Normal heart sounds.  Pulmonary:     Effort: Pulmonary effort is normal.     Breath sounds: Normal breath sounds.  Musculoskeletal:  Cervical back: Normal range of motion.  Neurological:     General: No focal deficit present.     Mental Status: He is alert.  Psychiatric:        Attention and Perception: Attention normal.        Mood and Affect: Affect normal. Mood is anxious.        Speech: Speech normal.        Behavior: Behavior normal. Behavior is cooperative.        Thought Content: Thought content normal.        Cognition and Memory: Cognition and memory normal.        Judgment: Judgment normal.      No results found for any visits on 08/14/24.  Recent Results (from the past 2160 hours)  Ferritin     Status: None   Collection Time: 07/09/24  1:08 PM  Result Value Ref Range   Ferritin 66 24 - 336 ng/mL    Comment: Performed at [facility], [address] [place], [place] [address]  Iron and TIBC     Status: Abnormal   Collection Time: 07/09/24  1:08 PM  Result Value Ref Range   Iron 179 45 - 182 ug/dL   TIBC 600 749 - 549 ug/dL   Saturation Ratios 45 (H) 17.9 - 39.5 %   UIBC 220 ug/dL    Comment: Performed at [facility], [address] [place], [place] [address]  AFP tumor marker     Status: None   Collection Time: 07/09/24  1:08 PM  Result Value Ref Range   AFP, Serum, Tumor Marker 2.1 0.0 - 8.4 ng/mL    Comment: (NOTE) Roche Diagnostics Electrochemiluminescence Immunoassay (ECLIA) Values obtained with different assay methods or kits cannot  be used interchangeably.  Results cannot be interpreted as absolute evidence of the presence or absence of malignant disease. This test is not interpretable in pregnant females. Performed At: [facility] [address] [place], [place] [id number] Jennette Shorter MD Ey:[id number]   CMP (Cancer Center only)     Status: Abnormal   Collection Time: 07/09/24  1:08 PM  Result Value Ref Range   Sodium 136 135 - 145 mmol/L   Potassium 4.4 3.5 - 5.1 mmol/L   Chloride 106 98 - 111 mmol/L   CO2 22 22 - 32 mmol/L   Glucose, Bld 118 (H) 70 - 99 mg/dL    Comment: Glucose reference range applies only to samples taken after fasting for at least 8 hours.   BUN 14 8 - 23 mg/dL   Creatinine 8.78 9.38 - 1.24 mg/dL   Calcium  9.0 8.9 - 10.3 mg/dL   Total Protein 7.4 6.5 - 8.1 g/dL   Albumin 3.8 3.5 - 5.0 g/dL   AST 27 15 - 41 U/L   ALT 28 0 - 44 U/L   Alkaline Phosphatase 56 38 - 126 U/L   Total Bilirubin 2.0 (H) 0.0 - 1.2 mg/dL   GFR, Estimated >39 >39 mL/min    Comment: (NOTE) Calculated using the CKD-EPI Creatinine Equation (2021)    Anion gap 8 5 - 15    Comment: Performed at [facility], [address] [place], [place] [address]  CBC with Differential (Cancer Center Only)     Status: Abnormal   Collection Time: 07/09/24  1:08 PM  Result Value Ref Range   WBC Count 8.8 4.0 - 10.5 K/uL   RBC 4.60 4.22 - 5.81 MIL/uL   Hemoglobin 15.5 13.0 -  17.0 g/dL   HCT 55.3 60.9 - 47.9 %   MCV 97.0 80.0 - 100.0 fL   MCH 33.7 26.0 - 34.0 pg   MCHC 34.8 30.0 - 36.0 g/dL   RDW 86.8 88.4 - 84.4 %   Platelet Count 112 (L) 150 - 400 K/uL   nRBC 0.0 0.0 - 0.2 %   Neutrophils Relative % 60 %   Neutro Abs 5.4 1.7 - 7.7 K/uL   Lymphocytes Relative 21 %   Lymphs Abs 1.9 0.7 - 4.0 K/uL   Monocytes Relative 11 %   Monocytes Absolute 1.0 0.1 - 1.0 K/uL   Eosinophils Relative 6 %   Eosinophils Absolute 0.5 0.0 - 0.5 K/uL   Basophils Relative 1 %   Basophils Absolute 0.1 0.0 - 0.1 K/uL    Immature Granulocytes 1 %   Abs Immature Granulocytes 0.04 0.00 - 0.07 K/uL    Comment: Performed at [facility], [address] [place], [place] [address]       Assessment & Plan Essential hypertension, benign Blood pressure well controlled with current medications.  Continue current therapy.  Will reassess at follow up.   - CBC w/Diff - CMP w/eGFR  Mixed hyperlipidemia Checking labs today.  Continue current therapy for lipid control. Will modify as needed based on labwork results.   -CMP w/eGFR -Lipid Panel  Vitamin D  deficiency, unspecified B12 deficiency due to diet Hypothyroidism (acquired) Other fatigue Checking labs today.  Will continue supplements as needed.   - Vitamin D  - Vitamin B12 - TSH  Prediabetes A1C Continues to be in prediabetic ranges.  Will reassess at follow up after next lab check.  Patient counseled on dietary choices and verbalized understanding.   -CBC w/Diff -CMP w/eGFR -Hemoglobin A1C  Chronic pain syndrome UDS obtained in office today.  Patient stable.  Well controlled with current therapy.   Continue current meds.  Anxiety Long term current use of therapeutic drug UDS obtained in office today.  Patient stable.  Well controlled with current therapy.   Continue current meds.   Flu vaccine need Flu vaccine given today in office.  Pt instructed to manage side effects with supportive measures.     Return in about 3 months (around 11/14/2024).   Total time spent: 20 minutes  ALAN CHRISTELLA ARRANT, FNP  08/14/2024   This document may have been prepared by [facility] Voice Recognition software and as such may include unintentional dictation errors.

## 2024-08-14 NOTE — Assessment & Plan Note (Signed)
 Checking labs today.  Continue current therapy for lipid control. Will modify as needed based on labwork results.   -CMP w/eGFR -Lipid Panel

## 2024-08-15 LAB — LIPID PANEL
Chol/HDL Ratio: 2.4 ratio (ref 0.0–5.0)
Cholesterol, Total: 112 mg/dL (ref 100–199)
HDL: 47 mg/dL (ref >39)
LDL Chol Calc (NIH): 46 mg/dL (ref 0–99)
Triglycerides: 100 mg/dL (ref 0–149)
VLDL Cholesterol Cal: 19 mg/dL (ref 5–40)

## 2024-08-15 LAB — CBC WITH DIFFERENTIAL/PLATELET
Basophils Absolute: 0.1 x10E3/uL (ref 0.0–0.2)
Basos: 1 %
EOS (ABSOLUTE): 0.4 x10E3/uL (ref 0.0–0.4)
Eos: 4 %
Hematocrit: 50.8 % (ref 37.5–51.0)
Hemoglobin: 17.1 g/dL (ref 13.0–17.7)
Immature Grans (Abs): 0 x10E3/uL (ref 0.0–0.1)
Immature Granulocytes: 0 %
Lymphocytes Absolute: 1.4 x10E3/uL (ref 0.7–3.1)
Lymphs: 16 %
MCH: 33.1 pg — ABNORMAL HIGH (ref 26.6–33.0)
MCHC: 33.7 g/dL (ref 31.5–35.7)
MCV: 98 fL — ABNORMAL HIGH (ref 79–97)
Monocytes Absolute: 1.3 x10E3/uL — ABNORMAL HIGH (ref 0.1–0.9)
Monocytes: 15 %
Neutrophils Absolute: 5.6 x10E3/uL (ref 1.4–7.0)
Neutrophils: 64 %
Platelets: 121 x10E3/uL — ABNORMAL LOW (ref 150–450)
RBC: 5.17 x10E6/uL (ref 4.14–5.80)
RDW: 12.5 % (ref 11.6–15.4)
WBC: 8.8 x10E3/uL (ref 3.4–10.8)

## 2024-08-15 LAB — CMP14+EGFR
ALT: 39 IU/L (ref 0–44)
AST: 40 IU/L (ref 0–40)
Albumin: 4.5 g/dL (ref 3.9–4.9)
Alkaline Phosphatase: 77 IU/L (ref 47–123)
BUN/Creatinine Ratio: 21 (ref 10–24)
BUN: 22 mg/dL (ref 8–27)
Bilirubin Total: 1.4 mg/dL — ABNORMAL HIGH (ref 0.0–1.2)
CO2: 21 mmol/L (ref 20–29)
Calcium: 10.6 mg/dL — ABNORMAL HIGH (ref 8.6–10.2)
Chloride: 100 mmol/L (ref 96–106)
Creatinine, Ser: 1.03 mg/dL (ref 0.76–1.27)
Globulin, Total: 3 g/dL (ref 1.5–4.5)
Glucose: 98 mg/dL (ref 70–99)
Potassium: 4.6 mmol/L (ref 3.5–5.2)
Sodium: 139 mmol/L (ref 134–144)
Total Protein: 7.5 g/dL (ref 6.0–8.5)
eGFR: 80 mL/min/1.73 (ref >59)

## 2024-08-15 LAB — HEMOGLOBIN A1C
Est. average glucose Bld gHb Est-mCnc: 94 mg/dL
Hgb A1c MFr Bld: 4.9 % (ref 4.8–5.6)

## 2024-08-15 LAB — TSH: TSH: 1.78 u[IU]/mL (ref 0.450–4.500)

## 2024-08-15 LAB — VITAMIN D 25 HYDROXY (VIT D DEFICIENCY, FRACTURES): Vit D, 25-Hydroxy: 44.5 ng/mL (ref 30.0–100.0)

## 2024-08-15 LAB — VITAMIN B12: Vitamin B-12: 780 pg/mL (ref 232–1245)

## 2024-08-16 ENCOUNTER — Other Ambulatory Visit: Payer: Self-pay | Admitting: Family

## 2024-08-16 LAB — TOXASSURE SELECT 13 (MW), URINE

## 2024-08-19 ENCOUNTER — Other Ambulatory Visit: Payer: Self-pay | Admitting: Medical

## 2024-08-19 ENCOUNTER — Other Ambulatory Visit: Payer: Self-pay | Admitting: Family

## 2024-08-19 DIAGNOSIS — E039 Hypothyroidism, unspecified: Secondary | ICD-10-CM

## 2024-08-20 NOTE — Telephone Encounter (Signed)
 Prescription refill request for Xarelto  received.  Indication:afib Last office visit:10/25 Weight:72.4  kg Age:68 Scr:1.03  10/25 CrCl:72.24  ml/min  Prescription refilled

## 2024-09-05 ENCOUNTER — Other Ambulatory Visit: Payer: Self-pay | Admitting: Family

## 2024-09-17 ENCOUNTER — Other Ambulatory Visit: Payer: Self-pay | Admitting: Family

## 2024-10-03 ENCOUNTER — Other Ambulatory Visit: Payer: Self-pay | Admitting: Family

## 2024-10-14 ENCOUNTER — Other Ambulatory Visit: Payer: Self-pay | Admitting: Cardiology

## 2024-10-14 DIAGNOSIS — I10 Essential (primary) hypertension: Secondary | ICD-10-CM

## 2024-10-16 ENCOUNTER — Other Ambulatory Visit: Payer: Self-pay | Admitting: Family

## 2024-10-17 ENCOUNTER — Other Ambulatory Visit: Payer: Self-pay | Admitting: Cardiology

## 2024-10-17 DIAGNOSIS — I10 Essential (primary) hypertension: Secondary | ICD-10-CM

## 2024-11-02 ENCOUNTER — Other Ambulatory Visit: Payer: Self-pay | Admitting: Family

## 2024-11-03 ENCOUNTER — Other Ambulatory Visit: Payer: Self-pay | Admitting: Family

## 2024-11-07 ENCOUNTER — Encounter: Payer: Self-pay | Admitting: Internal Medicine

## 2024-11-08 ENCOUNTER — Ambulatory Visit
Admission: RE | Admit: 2024-11-08 | Discharge: 2024-11-08 | Disposition: A | Discharge status: Home / Self Care | Source: Ambulatory Visit | Attending: Internal Medicine | Admitting: Internal Medicine

## 2024-11-08 DIAGNOSIS — K769 Liver disease, unspecified: Secondary | ICD-10-CM | POA: Diagnosis present

## 2024-11-08 MED ORDER — GADOBUTROL 1 MMOL/ML IV SOLN
7.0000 mL | Freq: Once | INTRAVENOUS | Status: AC | PRN
  Administered 2024-11-08: 7 mL via INTRAVENOUS

## 2024-11-14 ENCOUNTER — Ambulatory Visit: Admitting: Family

## 2024-11-14 ENCOUNTER — Encounter: Payer: Self-pay | Admitting: Family

## 2024-11-14 VITALS — BP 150/84 | HR 56 | Ht 65.0 in | Wt 165.2 lb

## 2024-11-14 DIAGNOSIS — E559 Vitamin D deficiency, unspecified: Secondary | ICD-10-CM | POA: Insufficient documentation

## 2024-11-14 DIAGNOSIS — G894 Chronic pain syndrome: Secondary | ICD-10-CM | POA: Insufficient documentation

## 2024-11-14 DIAGNOSIS — Z79899 Other long term (current) drug therapy: Secondary | ICD-10-CM | POA: Insufficient documentation

## 2024-11-14 DIAGNOSIS — Z125 Encounter for screening for malignant neoplasm of prostate: Secondary | ICD-10-CM | POA: Diagnosis not present

## 2024-11-14 DIAGNOSIS — K7460 Unspecified cirrhosis of liver: Secondary | ICD-10-CM

## 2024-11-14 DIAGNOSIS — I10 Essential (primary) hypertension: Secondary | ICD-10-CM | POA: Diagnosis not present

## 2024-11-14 DIAGNOSIS — E663 Overweight: Secondary | ICD-10-CM | POA: Diagnosis not present

## 2024-11-14 DIAGNOSIS — K732 Chronic active hepatitis, not elsewhere classified: Secondary | ICD-10-CM

## 2024-11-14 DIAGNOSIS — E782 Mixed hyperlipidemia: Secondary | ICD-10-CM | POA: Diagnosis not present

## 2024-11-14 DIAGNOSIS — E538 Deficiency of other specified B group vitamins: Secondary | ICD-10-CM | POA: Insufficient documentation

## 2024-11-14 DIAGNOSIS — R7303 Prediabetes: Secondary | ICD-10-CM | POA: Insufficient documentation

## 2024-11-14 DIAGNOSIS — R5383 Other fatigue: Secondary | ICD-10-CM | POA: Insufficient documentation

## 2024-11-14 DIAGNOSIS — E039 Hypothyroidism, unspecified: Secondary | ICD-10-CM | POA: Diagnosis not present

## 2024-11-14 DIAGNOSIS — Z6827 Body mass index (BMI) 27.0-27.9, adult: Secondary | ICD-10-CM | POA: Diagnosis not present

## 2024-11-14 DIAGNOSIS — F411 Generalized anxiety disorder: Secondary | ICD-10-CM | POA: Diagnosis not present

## 2024-11-14 MED ORDER — LISINOPRIL 10 MG PO TABS: 10.0000 mg | ORAL_TABLET | Freq: Every day | ORAL | 3 refills | Status: AC

## 2024-11-14 NOTE — Assessment & Plan Note (Signed)
-   Continue healthy diet and exercise as tolerated. - Continue medications as prescribed. Restart Lisinopril  10 mg daily. - make appointment with Cardiologist; last seen 08/2024. - Check labs today

## 2024-11-14 NOTE — Assessment & Plan Note (Signed)
-   Check labs today - Supplementation recommended based off lab results and will notify patient at that time

## 2024-11-14 NOTE — Assessment & Plan Note (Signed)
-   Continue medications as prescribed.  - Pain clinic referral sent.

## 2024-11-14 NOTE — Assessment & Plan Note (Signed)
 Check PSA. ?

## 2024-11-14 NOTE — Progress Notes (Signed)
 "  Established Patient Office Visit  Subjective:  Patient ID: Gabriel Hoffman, male    DOB: 1956/09/10  Age: 69 y.o. MRN: [id number]  Chief Complaint  Patient presents with   Follow-up    3 month follow up    Patient is here today for his 3 months follow up.  He has been feeling fairly well since last appointment.   He does have additional concerns to discuss today. He would like a referral to pain specialist as his insurance has changed and would like to return to his previous pain provider. He reports his pain is well controlled on his current medication regimen.  He reports he has not had Lisinopril  in a month. He states pharmacy denied his refill. Upon chart review, Cardiologist recommended patient return for FU appointment. His BP is elevated today. Will send in prescription for him to start taking and advised patient to make appointment with his Cardiologist so they will continue filling his other prescriptions they manage as well.  Labs are due today. He is fasting and will get that collected today. He needs refills.   I have reviewed his active problem list, medication list, allergies, family history, social history, health maintenance, notes from last encounter, lab results for his appointment today.    Lung cancer screening is still past due. He still reports smoking approximately 5 cigarettes per day. Reinforced smoking cessation and getting lung cancer screening cone once a year.    No other concerns at this time.   Past Medical History:  Diagnosis Date   Anginal pain    Anxiety    Aortic atherosclerosis    Arthritis    Atrial fibrillation (HCC)    a.) CHA2DS2-VASc Score = 2 (HTN, vascular disease/prior MI).  b.) Rate/rhythm maintained on metoprolol  succinate; chronically anticoagulated with rivaroxaban .   CAD (coronary artery disease)    Carotid artery stenosis    Cirrhosis (HCC) 06/15/2020   Complication of anesthesia    a.) seizure like activity with propofol   during colonoscopy   COPD (chronic obstructive pulmonary disease) (HCC)    Current use of long term anticoagulation    a.) Rivaroxaban    Diastolic dysfunction 02/25/2020   a.) TTE 02/25/2020: normal LV function; EF 55-65%; GLS -15.8%; G1DD.   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 C282Y and S65C were negative.   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   Thrombocytopenia     Past Surgical History:  Procedure Laterality Date   CARDIAC CATHETERIZATION     CATARACT EXTRACTION Bilateral    COLONOSCOPY     FRACTURE SURGERY Left    pins rods left wrist   HARDWARE REMOVAL Right 04/30/2021   Procedure: HARDWARE REMOVAL, Right tibia IM nail removal;  Surgeon: Kathlynn Sharper, MD;  Location: [facility] ORS;  Service: Orthopedics;  Laterality: Right;   HERNIA REPAIR Bilateral    LEG SURGERY Right    rod in leg   RIGHT/LEFT HEART CATH AND CORONARY ANGIOGRAPHY Bilateral 10/17/2023   Procedure: RIGHT/LEFT HEART CATH AND CORONARY ANGIOGRAPHY;  Surgeon: Darron Deatrice LABOR, MD;  Location: [facility];  Service: Cardiovascular;  Laterality: Bilateral;   TOTAL KNEE ARTHROPLASTY Right 10/02/2021   Procedure: TOTAL KNEE ARTHROPLASTY;  Surgeon: Kathlynn Sharper, MD;  Location: [facility] ORS;  Service: Orthopedics;  Laterality: Right;   UPPER GI ENDOSCOPY      Social History   Socioeconomic History   Marital status: Single    Spouse name: Not on file   Number of children: Not on file   Years of education: Not on file   Highest education level: Not on file  Occupational History   Not on file  Tobacco Use   Smoking status: Every Day    Current packs/day: 0.50     Average packs/day: 1 pack/day for 50.0 years (49.8 ttl pk-yrs)    Types: Cigarettes    Start date: 1976   Smokeless tobacco: Never   Tobacco comments:    Smokes about 4-5 cigarettes a day. 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 In [place]; with sister.    Social Drivers of Health   Tobacco Use: High Risk (11/14/2024)   Patient History    Smoking Tobacco Use: Every Day    Smokeless Tobacco Use: Never    Passive Exposure: Not on file  Financial Resource Strain: High Risk (02/03/2024)   Received from [facility]   Overall Financial Resource Strain (CARDIA)    Difficulty of Paying Living Expenses: Hard  Food Insecurity: Food Insecurity Present (02/03/2024)   Received from [facility]   Epic    Within the past 12 months, you worried that your food would run out before you got the money to buy more.: Sometimes true    Within the past 12 months, the food you bought just didn't last and you didn't have money to get more.: Often true  Transportation Needs: No Transportation Needs (02/03/2024)   Received from [facility] - Transportation    In the past 12 months, has lack of transportation kept you from medical appointments or from getting medications?: No    Lack of Transportation (Non-Medical): No  Physical Activity: Not on file  Stress: Not on file  Social Connections: Not on file  Intimate Partner Violence: Not on file  Depression (PHQ2-9): Low Risk (07/09/2024)   Depression (PHQ2-9)    PHQ-2 Score: 0  Alcohol Screen: Not on file  Housing: High Risk (03/05/2024)   Received from [facility]   Epic    In the last 12 months, was there a time  when you were not able to pay the mortgage or rent on time?: Yes    In the past 12 months, how many times have you moved where you were living?: 1    At any time in the past 12 months, were you homeless or living in a shelter (including now)?: No  Utilities: Not At Risk (02/03/2024)   Received from [facility]ities    Threatened with loss of utilities: No  Health Literacy: Not on file    Family History  Problem Relation Age of Onset   Cancer Mother    COPD Mother    Cancer  Sister     Allergies[1]  Review of Systems  Constitutional:  Negative for malaise/fatigue.  HENT: Negative.    Eyes:  Negative for blurred vision and pain.  Respiratory:  Negative for cough and shortness of breath.   Cardiovascular:  Negative for chest pain, palpitations, claudication and leg swelling.  Gastrointestinal:  Negative for abdominal pain, blood in stool, constipation, diarrhea, nausea and vomiting.  Genitourinary:  Negative for dysuria, frequency and urgency.  Musculoskeletal: Negative.   Skin: Negative.   Neurological:  Negative for dizziness, tingling, sensory change and headaches.  Endo/Heme/Allergies: Negative.   Psychiatric/Behavioral: Negative.         Objective:   BP (!) 150/84   Pulse (!) 56   Ht 5' 5 (1.651 m)   Wt 165 lb 3.2 oz (74.9 kg)   SpO2 95%   BMI 27.49 kg/m   Vitals:   11/14/24 1113  BP: (!) 150/84  Pulse: (!) 56  Height: 5' 5 (1.651 m)  Weight: 165 lb 3.2 oz (74.9 kg)  SpO2: 95%  BMI (Calculated): 27.49    Physical Exam Vitals and nursing note reviewed.  Constitutional:      Appearance: Normal appearance.  HENT:     Head: Normocephalic.  Eyes:     Extraocular Movements: Extraocular movements intact.     Pupils: Pupils are equal, round, and reactive to light.  Cardiovascular:     Rate and Rhythm: Normal rate and regular rhythm.     Pulses: Normal pulses.     Heart sounds: Normal heart sounds. No murmur heard. Pulmonary:      Effort: Pulmonary effort is normal. No respiratory distress.     Breath sounds: Normal breath sounds.  Abdominal:     General: There is no distension.     Tenderness: There is no abdominal tenderness.  Musculoskeletal:        General: No tenderness. Normal range of motion.     Cervical back: Normal range of motion and neck supple.     Right lower leg: No edema.     Left lower leg: No edema.  Skin:    General: Skin is warm and dry.     Coloration: Skin is not jaundiced.     Findings: No erythema.  Neurological:     General: No focal deficit present.     Mental Status: He is alert and oriented to person, place, and time.  Psychiatric:        Mood and Affect: Mood normal.        Speech: Speech normal.        Behavior: Behavior is cooperative.        Cognition and Memory: Memory is not impaired.      No results found for any visits on 11/14/24.  No results found for this or any previous visit (from the past 2160 hours).     Assessment & Plan:   Assessment & Plan B12 deficiency due to diet Hypothyroidism (acquired) Vitamin D  deficiency, unspecified Other fatigue - Check labs today - Supplementation recommended based off lab results and will notify patient at that time  Essential hypertension, benign Mixed hyperlipidemia Prediabetes Overweight with body mass index (BMI) of 27 to 27.9 in adult - Continue healthy diet and exercise as tolerated. - Continue medications as prescribed. Restart Lisinopril  10 mg daily. - make appointment with Cardiologist; last seen 08/2024. - Check labs today  Prostate cancer screening - Check PSA. Long term current use of therapeutic drug Generalized anxiety disorder Chronic  active hepatitis (HCC) Hepatic cirrhosis, unspecified hepatic cirrhosis type, unspecified whether ascites present (HCC) Chronic pain syndrome - Continue medications as prescribed.  - Pain clinic referral sent.    Return for Medicare AWV/ FU.   Total time  spent: 30 minutes  Oddis DELENA Cain, FNP  11/14/2024   This document may have been prepared by [facility] Voice Recognition software and as such may include unintentional dictation errors.      [1]  Allergies Allergen Reactions   Codeine Nausea And Vomiting    With fever   Propofol  Other (See Comments)    Seizure like activity during colonoscopy.  Tolerated propofol  infusion with no reaction at all 10/02/21.   "

## 2024-11-15 ENCOUNTER — Ambulatory Visit: Payer: Self-pay

## 2024-11-15 ENCOUNTER — Other Ambulatory Visit: Payer: Self-pay | Admitting: Family

## 2024-11-15 DIAGNOSIS — E039 Hypothyroidism, unspecified: Secondary | ICD-10-CM

## 2024-11-15 LAB — CMP14+EGFR
ALT: 31 IU/L (ref 0–44)
AST: 33 IU/L (ref 0–40)
Albumin: 4.2 g/dL (ref 3.9–4.9)
Alkaline Phosphatase: 69 IU/L (ref 47–123)
BUN/Creatinine Ratio: 15 (ref 10–24)
BUN: 15 mg/dL (ref 8–27)
Bilirubin Total: 1.5 mg/dL — ABNORMAL HIGH (ref 0.0–1.2)
CO2: 19 mmol/L — ABNORMAL LOW (ref 20–29)
Calcium: 9.1 mg/dL (ref 8.6–10.2)
Chloride: 102 mmol/L (ref 96–106)
Creatinine, Ser: 1 mg/dL (ref 0.76–1.27)
Globulin, Total: 3.4 g/dL (ref 1.5–4.5)
Glucose: 98 mg/dL (ref 70–99)
Potassium: 4.4 mmol/L (ref 3.5–5.2)
Sodium: 136 mmol/L (ref 134–144)
Total Protein: 7.6 g/dL (ref 6.0–8.5)
eGFR: 83 mL/min/1.73

## 2024-11-15 LAB — CBC WITH DIFFERENTIAL/PLATELET
Basophils Absolute: 0.1 x10E3/uL (ref 0.0–0.2)
Basos: 1 %
EOS (ABSOLUTE): 0.4 x10E3/uL (ref 0.0–0.4)
Eos: 4 %
Hematocrit: 48.7 % (ref 37.5–51.0)
Hemoglobin: 17 g/dL (ref 13.0–17.7)
Immature Grans (Abs): 0 x10E3/uL (ref 0.0–0.1)
Immature Granulocytes: 0 %
Lymphocytes Absolute: 2 x10E3/uL (ref 0.7–3.1)
Lymphs: 21 %
MCH: 35.1 pg — ABNORMAL HIGH (ref 26.6–33.0)
MCHC: 34.9 g/dL (ref 31.5–35.7)
MCV: 101 fL — ABNORMAL HIGH (ref 79–97)
Monocytes Absolute: 1.2 x10E3/uL — ABNORMAL HIGH (ref 0.1–0.9)
Monocytes: 12 %
Neutrophils Absolute: 5.8 x10E3/uL (ref 1.4–7.0)
Neutrophils: 62 %
Platelets: 157 x10E3/uL (ref 150–450)
RBC: 4.84 x10E6/uL (ref 4.14–5.80)
RDW: 12.9 % (ref 11.6–15.4)
WBC: 9.4 x10E3/uL (ref 3.4–10.8)

## 2024-11-15 LAB — LIPID PANEL
Chol/HDL Ratio: 2.5 ratio (ref 0.0–5.0)
Cholesterol, Total: 114 mg/dL (ref 100–199)
HDL: 46 mg/dL
LDL Chol Calc (NIH): 54 mg/dL (ref 0–99)
Triglycerides: 64 mg/dL (ref 0–149)
VLDL Cholesterol Cal: 14 mg/dL (ref 5–40)

## 2024-11-15 LAB — VITAMIN D 25 HYDROXY (VIT D DEFICIENCY, FRACTURES): Vit D, 25-Hydroxy: 21.8 ng/mL — ABNORMAL LOW (ref 30.0–100.0)

## 2024-11-15 LAB — TSH: TSH: 1.7 u[IU]/mL (ref 0.450–4.500)

## 2024-11-15 LAB — VITAMIN B12: Vitamin B-12: 632 pg/mL (ref 232–1245)

## 2024-11-15 LAB — HEMOGLOBIN A1C
Est. average glucose Bld gHb Est-mCnc: 91 mg/dL
Hgb A1c MFr Bld: 4.8 % (ref 4.8–5.6)

## 2024-11-15 LAB — PSA: Prostate Specific Ag, Serum: 1.9 ng/mL (ref 0.0–4.0)

## 2024-11-15 NOTE — Addendum Note (Signed)
 Addended by: ORLEAN PALMA on: 11/15/2024 12:57 PM   Modules accepted: Orders

## 2024-11-16 NOTE — Progress Notes (Signed)
 Patient notified

## 2024-11-21 ENCOUNTER — Encounter: Payer: Self-pay | Admitting: Internal Medicine

## 2024-11-21 ENCOUNTER — Inpatient Hospital Stay: Attending: Internal Medicine

## 2024-11-21 ENCOUNTER — Inpatient Hospital Stay: Admitting: Internal Medicine

## 2024-11-21 DIAGNOSIS — K769 Liver disease, unspecified: Secondary | ICD-10-CM | POA: Diagnosis not present

## 2024-11-21 LAB — CMP (CANCER CENTER ONLY)
ALT: 29 U/L (ref 0–44)
AST: 30 U/L (ref 15–41)
Albumin: 3.9 g/dL (ref 3.5–5.0)
Alkaline Phosphatase: 64 U/L (ref 38–126)
Anion gap: 9 (ref 5–15)
BUN: 16 mg/dL (ref 8–23)
CO2: 25 mmol/L (ref 22–32)
Calcium: 9.4 mg/dL (ref 8.9–10.3)
Chloride: 103 mmol/L (ref 98–111)
Creatinine: 1.11 mg/dL (ref 0.61–1.24)
GFR, Estimated: >60 mL/min
Glucose, Bld: 149 mg/dL — ABNORMAL HIGH (ref 70–99)
Potassium: 4.5 mmol/L (ref 3.5–5.1)
Sodium: 137 mmol/L (ref 135–145)
Total Bilirubin: 1.3 mg/dL — ABNORMAL HIGH (ref 0.0–1.2)
Total Protein: 7.1 g/dL (ref 6.5–8.1)

## 2024-11-21 LAB — CBC WITH DIFFERENTIAL (CANCER CENTER ONLY)
Abs Immature Granulocytes: 0.03 K/uL (ref 0.00–0.07)
Basophils Absolute: 0.1 K/uL (ref 0.0–0.1)
Basophils Relative: 1 %
Eosinophils Absolute: 0.4 K/uL (ref 0.0–0.5)
Eosinophils Relative: 5 %
HCT: 43.9 % (ref 39.0–52.0)
Hemoglobin: 15.4 g/dL (ref 13.0–17.0)
Immature Granulocytes: 0 %
Lymphocytes Relative: 22 %
Lymphs Abs: 1.8 K/uL (ref 0.7–4.0)
MCH: 34.5 pg — ABNORMAL HIGH (ref 26.0–34.0)
MCHC: 35.1 g/dL (ref 30.0–36.0)
MCV: 98.4 fL (ref 80.0–100.0)
Monocytes Absolute: 0.8 K/uL (ref 0.1–1.0)
Monocytes Relative: 11 %
Neutro Abs: 4.8 K/uL (ref 1.7–7.7)
Neutrophils Relative %: 61 %
Platelet Count: 111 K/uL — ABNORMAL LOW (ref 150–400)
RBC: 4.46 MIL/uL (ref 4.22–5.81)
RDW: 12.1 % (ref 11.5–15.5)
WBC Count: 7.8 K/uL (ref 4.0–10.5)
nRBC: 0 % (ref 0.0–0.2)

## 2024-11-21 LAB — IRON AND TIBC
Iron: 133 ug/dL (ref 45–182)
Saturation Ratios: 33 % (ref 17.9–39.5)
TIBC: 402 ug/dL (ref 250–450)
UIBC: 269 ug/dL

## 2024-11-21 LAB — FERRITIN: Ferritin: 102 ng/mL (ref 24–336)

## 2024-11-21 MED ORDER — ERGOCALCIFEROL 1.25 MG (50000 UT) PO CAPS: 50000.0000 [IU] | ORAL_CAPSULE | ORAL | 1 refills | Status: AC

## 2024-11-21 NOTE — Progress Notes (Signed)
 MRI liver 11/08/24.  Pt would like to know if it is ok to take zquil to help him sleep prn?

## 2024-11-21 NOTE — Assessment & Plan Note (Addendum)
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 HOLD  off phlebotomy at this time.  Awaiting iron studies.Gabriel Hoffman  #Thrombocytopenia-platelets 114  [baseline 100] -secondary to hypersplenism cirrhosis-monitor closely as especially patient on Xarelto .  # HCC surveillance: [since April 2024]- MRI liver: Redemonstration of cirrhotic liver configuration with splenomegaly. No ascites.  Redemonstration of an approximately 8 x 10 mm arterial hyperenhancing lesion in the right hepatic lobe, segment 7, which is essentially similar to the prior study. The lesion is not seen on any other sequences. The lesion remains indeterminate and characterized as LI-RADS 3 lesion. Follow-up examination in 6 months is again recommended to document longer stability.  Will order MRI of the liver in 6 months.   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 Defer to Gi [Dr.Wohl]. stable  # Active smoker- trying to quit/ Recommend quitting smoking/LCSP [MAY 2024]-  stable  # Paroxysmal atrial fibrillation [Dr.Etang] s/p ablation September 2020 at [facility]. on BB , Xarelto -discussed watching out for bleeding while on Xarelto . Stable  # Vit D deficiency-called in a prescription.  Will recheck again in 6 months.  ? Phlebotomy  # DISPOSITION: # follow up  in  6  months- MD: cbc/cmp; AFP; vit D 25-OH - iron studies; ferritin- MRI liver-Dr.B  # I reviewed the blood work- with the patient in detail; also reviewed the imaging independently [as summarized above]; and with the patient in detail.

## 2024-11-21 NOTE — Progress Notes (Signed)
 [facility] CONSULT NOTE  Patient Care Team: Orlean Alan HERO, FNP as PCP - General (Family Medicine) Darliss Rogue, MD as PCP - Cardiology (Cardiology) Kennyth Chew, MD as PCP - Electrophysiology (Cardiology) Rennie Cindy SAUNDERS, MD as Consulting Physician (Oncology)  CHIEF COMPLAINTS/PURPOSE OF CONSULTATION: Hemochromatosis.  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  Two copies of H63D were identified.  C282Y and S65C were negative;Elevated Ferritin/I sat-88%;  SEP 2021- s/p Phlebotomy x3  # Elevated AFP- 73 [SEP 2021]; NOV 2021- MRI- liver NEG;  # Cirrhosis/ HEP C [started end of sep,2021 x 12 weeks; Dr.Wohl]  # CAD- on xarelto .   HISTORY OF PRESENTING ILLNESS: Ambulating independently. Alone.   Gabriel Hoffman 69 y.o.  male history of hepatitis C/cirrhosis splenomegaly and history of hereditary hemochromatosis/elevated iron saturation/ferritin is here for follow-up/review results of the liver MRI.  Discussed the use of AI scribe software for clinical note transcription with the patient, who gave verbal consent to proceed.  History of Present Illness   Gabriel Hoffman is a 69 year old male with hereditary hemochromatosis and cirrhosis who presents for follow-up of a stable right hepatic lesion.  He is under surveillance for a small, stable lesion in the right hepatic lobe, initially identified nearly two years ago. Serial imaging, including the most recent MRI, demonstrates no interval growth or new lesions. The lesion remains unchanged in size. He is asymptomatic from a hepatic standpoint, denying abdominal pain, jaundice, or constitutional symptoms.  He has vitamin D  deficiency confirmed on recent laboratory testing and is not currently taking supplementation. He reports variable sleep quality and inquires about the safety of  Z-Quil use given his hypertension. He has no new complaints related to his liver or general health.  He has a history of tobacco use and is undergoing routine lung cancer screening with CT imaging, which has been unremarkable. His primary care provider is managing this aspect of his care.      Review of Systems  Constitutional:  Positive for malaise/fatigue. Negative for chills, diaphoresis, fever and weight loss.  HENT:  Negative for nosebleeds and sore throat.   Eyes:  Negative for double vision.  Respiratory:  Negative for cough, hemoptysis, sputum production, shortness of breath and wheezing.   Cardiovascular:  Negative for chest pain, palpitations, orthopnea and leg swelling.  Gastrointestinal:  Negative for abdominal pain, blood in stool, constipation, diarrhea, heartburn, melena, nausea and vomiting.  Genitourinary:  Negative for dysuria, frequency and urgency.  Musculoskeletal:  Positive for back pain, joint pain and myalgias.  Skin: Negative.  Negative for itching and rash.  Neurological:  Negative for dizziness, tingling, focal weakness, weakness and headaches.  Endo/Heme/Allergies:  Does not bruise/bleed easily.  Psychiatric/Behavioral:  Negative for depression. The patient is not nervous/anxious and does not have insomnia.     MEDICAL HISTORY:  Past Medical History:  Diagnosis Date   Anginal pain    Anxiety    Aortic atherosclerosis    Arthritis    Atrial fibrillation (HCC)    a.) CHA2DS2-VASc Score = 2 (HTN, vascular disease/prior MI).  b.) Rate/rhythm maintained on metoprolol  succinate; chronically anticoagulated with rivaroxaban .   CAD (coronary artery disease)    Carotid artery stenosis    Cirrhosis (HCC) 06/15/2020   Complication of anesthesia    a.) seizure like activity with propofol  during colonoscopy   COPD (chronic obstructive pulmonary disease) (HCC)  Current use of long term anticoagulation    a.) Rivaroxaban    Diastolic dysfunction 02/25/2020   a.) TTE  02/25/2020: normal LV function; EF 55-65%; GLS -15.8%; G1DD.   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 C282Y and S65C were negative.   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   Thrombocytopenia     SURGICAL HISTORY: Past Surgical History:  Procedure Laterality Date   CARDIAC CATHETERIZATION     CATARACT EXTRACTION Bilateral    COLONOSCOPY     FRACTURE SURGERY Left    pins rods left wrist   HARDWARE REMOVAL Right 04/30/2021   Procedure: HARDWARE REMOVAL, Right tibia IM nail removal;  Surgeon: Kathlynn Sharper, MD;  Location: [facility] ORS;  Service: Orthopedics;  Laterality: Right;   HERNIA REPAIR Bilateral    LEG SURGERY Right    rod in leg   RIGHT/LEFT HEART CATH AND CORONARY ANGIOGRAPHY Bilateral 10/17/2023   Procedure: RIGHT/LEFT HEART CATH AND CORONARY ANGIOGRAPHY;  Surgeon: Darron Deatrice LABOR, MD;  Location: [facility];  Service: Cardiovascular;  Laterality: Bilateral;   TOTAL KNEE ARTHROPLASTY Right 10/02/2021   Procedure: TOTAL KNEE ARTHROPLASTY;  Surgeon: Kathlynn Sharper, MD;  Location: [facility] ORS;  Service: Orthopedics;  Laterality: Right;   UPPER GI ENDOSCOPY      SOCIAL HISTORY: Social History   Socioeconomic History   Marital status: Single    Spouse name: Not on file   Number of children: Not on file   Years of education: Not on file   Highest education level: Not on file  Occupational History   Not on file  Tobacco Use   Smoking status: Every Day    Current packs/day: 0.50    Average packs/day: 1 pack/day for 50.1 years (49.8 ttl pk-yrs)    Types: Cigarettes    Start date: 1976   Smokeless tobacco: Never   Tobacco  comments:    Smokes about 4-5 cigarettes a day. 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 In [place]; with sister.    Social Drivers of Health   Tobacco Use: High Risk (11/21/2024)   Patient History    Smoking Tobacco Use: Every Day    Smokeless Tobacco Use: Never    Passive Exposure: Not on file  Financial Resource Strain: High Risk (02/03/2024)   Received from [facility]   Overall Financial Resource Strain (CARDIA)    Difficulty of Paying Living Expenses: Hard  Food Insecurity: Food Insecurity Present (02/03/2024)   Received from [facility]   Epic    Within the past 12 months, you worried that your food would run out before you got the money to buy more.: Sometimes true    Within the past 12 months, the food you bought just didn't last  and you didn't have money to get more.: Often true  Transportation Needs: No Transportation Needs (02/03/2024)   Received from [facility] - Transportation    In the past 12 months, has lack of transportation kept you from medical appointments or from getting medications?: No    Lack of Transportation (Non-Medical): No  Physical Activity: Not on file  Stress: Not on file  Social Connections: Not on file  Intimate Partner Violence: Not on file  Depression (PHQ2-9): Low Risk (11/21/2024)   Depression (PHQ2-9)    PHQ-2 Score: 0  Alcohol Screen: Not on file  Housing: High Risk (03/05/2024)   Received from [facility]   Epic    In the last 12 months, was there a time when you were not able to pay the mortgage or rent on time?: Yes    In the past 12 months, how many times have you moved where you were  living?: 1    At any time in the past 12 months, were you homeless or living in a shelter (including now)?: No  Utilities: Not At Risk (02/03/2024)   Received from [facility] Utilities    Threatened with loss of utilities: No  Health Literacy: Not on file    FAMILY HISTORY: Family History  Problem Relation Age of Onset   Cancer Mother    COPD Mother    Cancer Sister     ALLERGIES:  is allergic to codeine and propofol .  MEDICATIONS:  Current Outpatient Medications  Medication Sig Dispense Refill   albuterol  (VENTOLIN  HFA) 108 (90 Base) MCG/ACT inhaler Inhale 2 puffs into the lungs every 6 (six) hours as needed. 8 g 2   ALPRAZolam  (XANAX ) 0.5 MG tablet Take 1 tablet (0.5 mg total) by mouth 2 (two) times daily as needed for anxiety. 60 tablet 0   atorvastatin  (LIPITOR) 10 MG tablet Take 1 tablet (10 mg total) by mouth daily. 90 tablet 3   ergocalciferol  (VITAMIN D2) 1.25 MG (50000 UT) capsule Take 1 capsule (50,000 Units total) by mouth once a week. 12 capsule 1   levothyroxine  (SYNTHROID ) 125 MCG tablet TAKE 1 TABLET DAILY. 90 tablet 0   lisinopril  (ZESTRIL ) 10 MG tablet Take 1 tablet (10 mg total) by mouth daily. 90 tablet 3   metoprolol  succinate (TOPROL -XL) 100 MG 24 hr tablet Take 1 tablet (100 mg total) by mouth daily. Take with or immediately following a meal. 90 tablet 3   naloxone  (NARCAN ) nasal spray 4 mg/0.1 mL Use as needed for respiratory distress due to accidental overdose of medications. One spray into nose. (Patient taking differently: as needed. Use as needed for respiratory distress due to accidental overdose of medications. One spray into nose.) 2 each 0   nitroGLYCERIN  (NITROSTAT ) 0.4 MG SL tablet Place 1 tablet (0.4 mg total) under the tongue every 5 (five) minutes x 3 doses as needed for chest pain. 25 tablet 2   rivaroxaban  (XARELTO ) 20 MG TABS tablet Take 1 tablet (20 mg total) by mouth in the morning. 90 tablet 1   SPIRIVA  RESPIMAT 2.5  MCG/ACT AERS SMARTSIG:2 Puff(s) Via Inhaler Daily     XTAMPZA  ER 9 MG C12A Take 9 mg by mouth every 12 (twelve) hours. 60 capsule 0   No current facility-administered medications for this visit.      PHYSICAL EXAMINATION:   Vitals:   11/21/24 1340  BP: 110/67  Pulse: (!) 53  Resp: 18  Temp: 98.1 F (36.7 C)  SpO2: 96%    Filed Weights   11/21/24 1340  Weight: 166 lb 8 oz (75.5 kg)     Physical Exam HENT:     Head: Normocephalic and atraumatic.     Mouth/Throat:     Pharynx: No oropharyngeal exudate.  Eyes:     Pupils: Pupils are equal, round, and reactive to light.  Cardiovascular:     Rate and Rhythm: Normal rate and regular rhythm.  Pulmonary:     Effort: No respiratory distress.     Breath sounds: No wheezing.     Comments: Decreased air entry bilaterally.  Abdominal:     General: Bowel sounds are normal. There is no distension.     Palpations: Abdomen is soft. There is no mass.     Tenderness: There is no abdominal tenderness. There is no guarding or rebound.  Musculoskeletal:        General: No tenderness. Normal range of motion.     Cervical back: Normal range of motion and neck supple.  Skin:    General: Skin is warm.  Neurological:     Mental Status: He is alert and oriented to person, place, and time.  Psychiatric:        Mood and Affect: Affect normal.     LABORATORY DATA:  I have reviewed the data as listed Lab Results  Component Value Date   WBC 9.4 11/14/2024   HGB 17.0 11/14/2024   HCT 48.7 11/14/2024   MCV 101 (H) 11/14/2024   PLT 157 11/14/2024   Recent Labs    12/26/23 1258 01/27/24 1146 07/09/24 1308 08/14/24 1204 11/14/24 1216 11/21/24 1348  NA 137   < > 136 139 136 137  K 4.5   < > 4.4 4.6 4.4 4.5  CL 104   < > 106 100 102 103  CO2 26   < > 22 21 19* 25  GLUCOSE 121*   < > 118* 98 98 149*  BUN 19   < > 14 22 15 16   CREATININE 1.07   < > 1.21 1.03 1.00 1.11  CALCIUM  9.1   < > 9.0 10.6* 9.1 9.4  GFRNONAA >60  --  >60   --   --  >60  PROT 7.4   < > 7.4 7.5 7.6 7.1  ALBUMIN 3.6   < > 3.8 4.5 4.2 3.9  AST 23   < > 27 40 33 30  ALT 29   < > 28 39 31 29  ALKPHOS 62   < > 56 77 69 64  BILITOT 1.1   < > 2.0* 1.4* 1.5* 1.3*   < > = values in this interval not displayed.     MR LIVER W WO CONTRAST Result Date: 11/08/2024 CLINICAL DATA:  lesion/cirrhosis.  Recheck on liver lesions. EXAM: MRI ABDOMEN WITHOUT AND WITH CONTRAST TECHNIQUE: Multiplanar multisequence MR imaging of the abdomen was performed both before and after the administration of intravenous contrast. CONTRAST:  7mL GADAVIST  GADOBUTROL  1 MMOL/ML IV SOLN COMPARISON:  MRI abdomen from 06/22/2024. FINDINGS: Lower chest: Unremarkable MR appearance to the lung bases. No pleural effusion. No pericardial effusion. Normal heart size. Hepatobiliary: The liver is normal in size. There is surface nodularity, compatible with cirrhosis. Redemonstration of an approximately 8 x 10 mm (series 14, image 27), arterial hyperenhancing target like lesion in the right hepatic lobe, segment 7 which is essentially similar to the prior study. The lesion is slightly hyperenhancing than  the background parenchyma on the portal venous phase images. The lesion is isointense on the delayed/equivalent phase images. There is no washout. The lesion is not seen on any other sequences. The lesion remains indeterminate and characterized as LI-RADS 3 lesion. Correlate clinically and with tumor markers. In case of known history of other malignancy, metastasis also remains in the differential diagnosis, but favored less likely due to single lesion and short-term stability. There is a stable, 2 x 7 mm, linear subcapsular arterial hyperenhancing area in the left hepatic lobe, segment 3 (series 14, image 43), which is not seen on any other sequences and may represent a small arterial portal shunt. No other focal liver lesions seen.  No suspicious mass. No intrahepatic or extrahepatic bile duct dilatation. No  choledocholithiasis. Unremarkable gallbladder. Pancreas: No mass, inflammatory changes or other parenchymal abnormality identified. No main pancreatic duct dilation. Spleen: Enlarged measuring upto 7.5 x 13.0 cm orthogonally on coronal plane. No focal mass. Adrenals/Urinary Tract: Unremarkable adrenal glands. No hydroureteronephrosis. No suspicious renal mass. There several scattered sub 5 mm simple cysts in the left kidney. Stomach/Bowel: Visualized portions within the abdomen are unremarkable. No disproportionate dilation of bowel loops. There is a tiny sliding hiatal hernia. Vascular/Lymphatic: No pathologically enlarged lymph nodes identified. No abdominal aortic aneurysm demonstrated. No ascites. Other:  None. Musculoskeletal: No suspicious bone lesions identified. IMPRESSION: 1. Redemonstration of cirrhotic liver configuration with splenomegaly. No ascites. 2. Redemonstration of an approximately 8 x 10 mm arterial hyperenhancing lesion in the right hepatic lobe, segment 7, which is essentially similar to the prior study. The lesion is not seen on any other sequences. The lesion remains indeterminate and characterized as LI-RADS 3 lesion. Follow-up examination in 6 months is again recommended to document longer stability. 3. Otherwise essentially unremarkable exam, as described above. Electronically Signed   By: Ree Molt M.D.   On: 11/08/2024 10:53    Hemochromatosis, hereditary # Hereditary hemochromatosis-[Elevated iron saturation-iron studies; unclear if patient has organ involvement-given the hepatitis C/cirrhosis]- s/p phelbtomy x3. HOLD  off phlebotomy at this time.  Awaiting iron studies.SABRA  #Thrombocytopenia-platelets 114  [baseline 100] -secondary to hypersplenism cirrhosis-monitor closely as especially patient on Xarelto .  # HCC surveillance: [since April 2024]- MRI liver: Redemonstration of cirrhotic liver configuration with splenomegaly. No ascites.  Redemonstration of an approximately  8 x 10 mm arterial hyperenhancing lesion in the right hepatic lobe, segment 7, which is essentially similar to the prior study. The lesion is not seen on any other sequences. The lesion remains indeterminate and characterized as LI-RADS 3 lesion. Follow-up examination in 6 months is again recommended to document longer stability.  Will order MRI of the liver in 6 months.   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 Defer to Gi [Dr.Wohl]. stable  # Active smoker- trying to quit/ Recommend quitting smoking/LCSP [MAY 2024]-  stable  # Paroxysmal atrial fibrillation [Dr.Etang] s/p ablation September 2020 at [facility]. on BB , Xarelto -discussed watching out for bleeding while on Xarelto . Stable  # Vit D deficiency-called in a prescription.  Will recheck again in 6 months.  ? Phlebotomy  # DISPOSITION: # follow up  in  6  months- MD: cbc/cmp; AFP; vit D 25-OH - iron studies; ferritin- MRI liver-Dr.B  # I reviewed the blood work- with the patient in detail; also reviewed the imaging independently [as summarized above]; and with the patient in detail.       All questions were answered. The patient knows to call the clinic  with any problems, questions or concerns.   Cindy JONELLE Joe, MD 11/21/2024 2:29 PM

## 2024-11-22 LAB — AFP TUMOR MARKER: AFP, Serum, Tumor Marker: 2.5 ng/mL (ref 0.0–8.4)

## 2024-12-02 ENCOUNTER — Other Ambulatory Visit: Payer: Self-pay | Admitting: Family

## 2024-12-06 ENCOUNTER — Encounter: Payer: Self-pay | Admitting: Internal Medicine

## 2025-02-14 ENCOUNTER — Ambulatory Visit: Admitting: Family

## 2025-02-15 ENCOUNTER — Ambulatory Visit: Admitting: Family

## 2025-05-21 ENCOUNTER — Inpatient Hospital Stay: Admitting: Internal Medicine

## 2025-05-21 ENCOUNTER — Inpatient Hospital Stay

## 2025-05-22 ENCOUNTER — Other Ambulatory Visit

## 2025-05-28 ENCOUNTER — Inpatient Hospital Stay: Admitting: Internal Medicine

## 2025-05-28 ENCOUNTER — Inpatient Hospital Stay
